# Patient Record
Sex: Female | Born: 1985 | Race: Black or African American | Hispanic: No | Marital: Married | Smoking: Never smoker
Health system: Southern US, Community
[De-identification: ages and names within clinical notes are randomized; demographics above are authoritative.]

## PROBLEM LIST (undated history)

## (undated) DIAGNOSIS — T7840XA Allergy, unspecified, initial encounter: Secondary | ICD-10-CM

## (undated) DIAGNOSIS — D573 Sickle-cell trait: Secondary | ICD-10-CM

## (undated) DIAGNOSIS — I10 Essential (primary) hypertension: Secondary | ICD-10-CM

## (undated) DIAGNOSIS — M1990 Unspecified osteoarthritis, unspecified site: Secondary | ICD-10-CM

## (undated) DIAGNOSIS — J45909 Unspecified asthma, uncomplicated: Secondary | ICD-10-CM

## (undated) DIAGNOSIS — Z5189 Encounter for other specified aftercare: Secondary | ICD-10-CM

## (undated) DIAGNOSIS — M6751 Plica syndrome, right knee: Secondary | ICD-10-CM

## (undated) DIAGNOSIS — R7303 Prediabetes: Secondary | ICD-10-CM

## (undated) DIAGNOSIS — G43909 Migraine, unspecified, not intractable, without status migrainosus: Secondary | ICD-10-CM

## (undated) DIAGNOSIS — N857 Hematometra: Secondary | ICD-10-CM

## (undated) DIAGNOSIS — G4730 Sleep apnea, unspecified: Secondary | ICD-10-CM

## (undated) DIAGNOSIS — M94261 Chondromalacia, right knee: Secondary | ICD-10-CM

## (undated) DIAGNOSIS — D571 Sickle-cell disease without crisis: Secondary | ICD-10-CM

## (undated) DIAGNOSIS — F419 Anxiety disorder, unspecified: Secondary | ICD-10-CM

## (undated) DIAGNOSIS — J302 Other seasonal allergic rhinitis: Secondary | ICD-10-CM

## (undated) DIAGNOSIS — E119 Type 2 diabetes mellitus without complications: Secondary | ICD-10-CM

## (undated) DIAGNOSIS — J069 Acute upper respiratory infection, unspecified: Secondary | ICD-10-CM

## (undated) DIAGNOSIS — D649 Anemia, unspecified: Secondary | ICD-10-CM

## (undated) HISTORY — PX: HERNIA REPAIR: SHX51

## (undated) HISTORY — DX: Allergy, unspecified, initial encounter: T78.40XA

## (undated) HISTORY — DX: Unspecified osteoarthritis, unspecified site: M19.90

## (undated) HISTORY — DX: Migraine, unspecified, not intractable, without status migrainosus: G43.909

## (undated) HISTORY — DX: Sleep apnea, unspecified: G47.30

## (undated) HISTORY — PX: DIAGNOSTIC LAPAROSCOPY: SUR761

## (undated) HISTORY — DX: Sickle-cell trait: D57.3

## (undated) HISTORY — DX: Encounter for other specified aftercare: Z51.89

## (undated) HISTORY — PX: ABDOMINAL HYSTERECTOMY: SHX81

## (undated) HISTORY — DX: Acute upper respiratory infection, unspecified: J06.9

## (undated) HISTORY — DX: Hematometra: N85.7

## (undated) HISTORY — DX: Type 2 diabetes mellitus without complications: E11.9

## (undated) HISTORY — PX: SPINE SURGERY: SHX786

## (undated) HISTORY — DX: Sickle-cell disease without crisis: D57.1

---
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
## 2005-02-25 ENCOUNTER — Encounter: Admission: RE | Admit: 2005-02-25 | Discharge: 2005-02-25 | Payer: Self-pay | Admitting: *Deleted

## 2005-03-06 ENCOUNTER — Inpatient Hospital Stay (HOSPITAL_COMMUNITY): Admission: AD | Admit: 2005-03-06 | Discharge: 2005-03-07 | Payer: Self-pay | Admitting: Obstetrics and Gynecology

## 2005-05-27 DIAGNOSIS — Z5189 Encounter for other specified aftercare: Secondary | ICD-10-CM

## 2005-05-27 DIAGNOSIS — IMO0001 Reserved for inherently not codable concepts without codable children: Secondary | ICD-10-CM

## 2005-05-27 HISTORY — DX: Encounter for other specified aftercare: Z51.89

## 2005-05-27 HISTORY — DX: Reserved for inherently not codable concepts without codable children: IMO0001

## 2005-06-28 IMAGING — US US FETAL BPP W/O NONSTRESS
1 series · 13 of 28 positions shown · non-contrast
Comparison: none

CLINICAL DATA: 28 week 2 day assigned gestational age.  Preterm labor.  Vaginal bleeding.  Evaluate amniotic fluid, cervix and biophysical profile.

[Series 1: us fetal bpp w/o nonstress · 0.33mm/px · 13 of 43 slices shown]
[im 2/43]
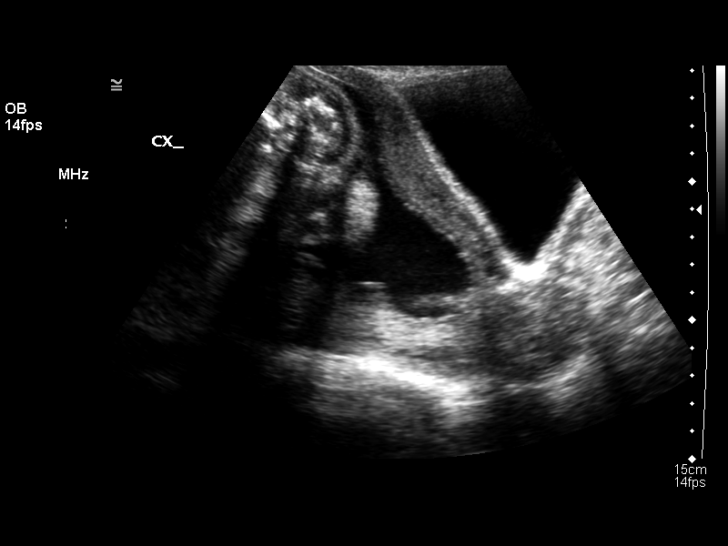
[im 5/43]
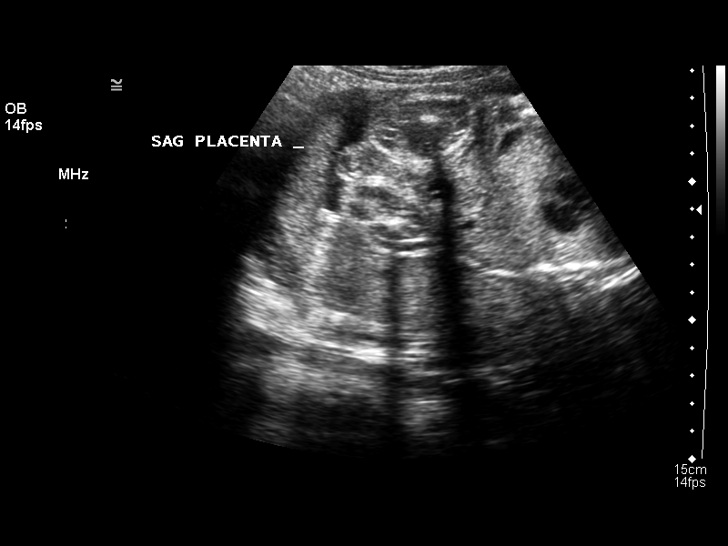
[im 8/43]
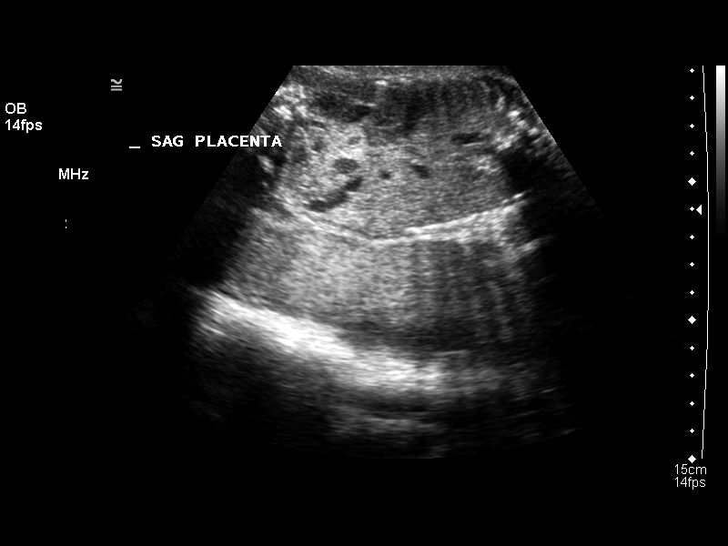
[im 11/43]
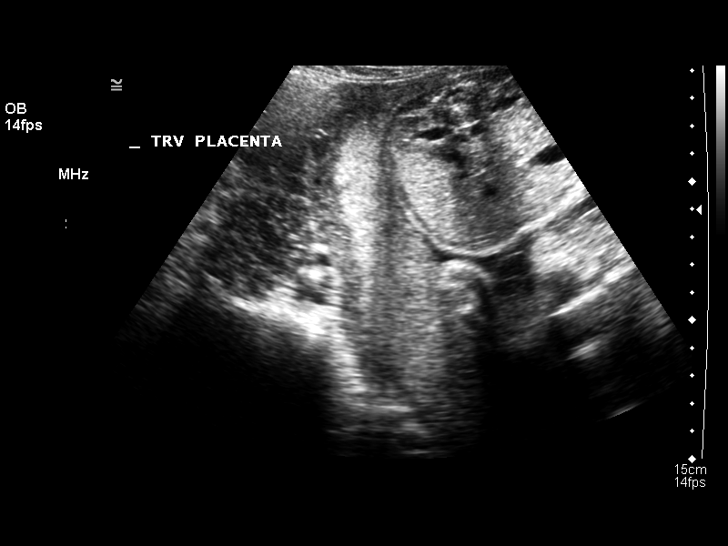
[im 15/43]
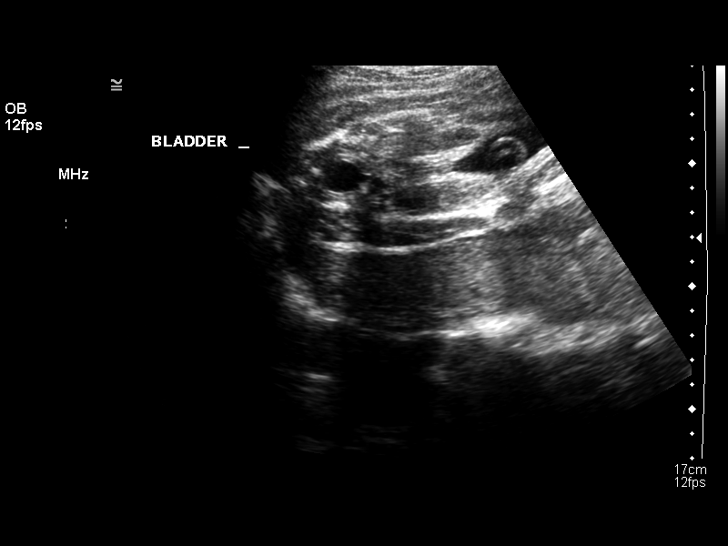
[im 18/43]
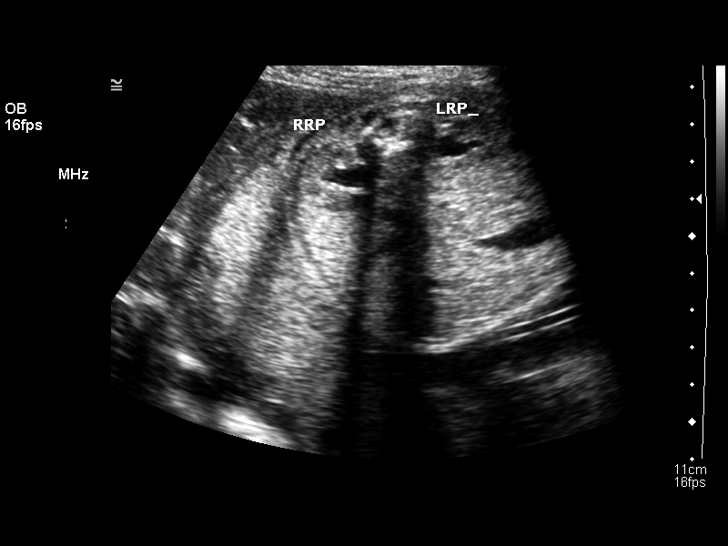
[im 22/43]
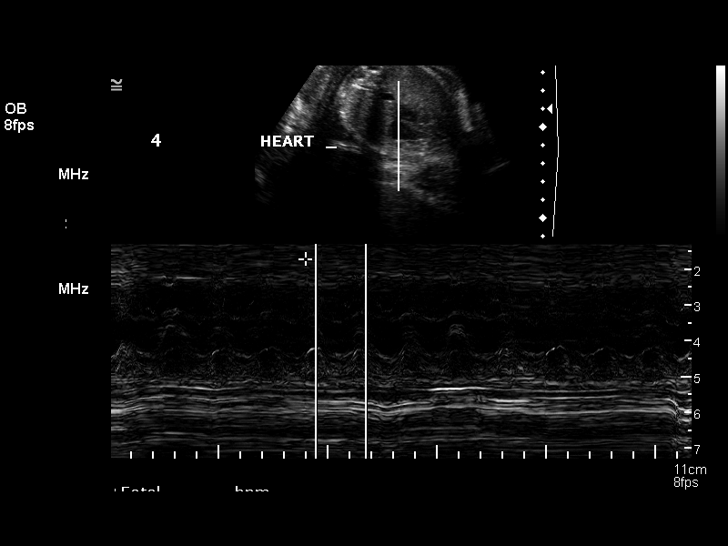
[im 25/43]
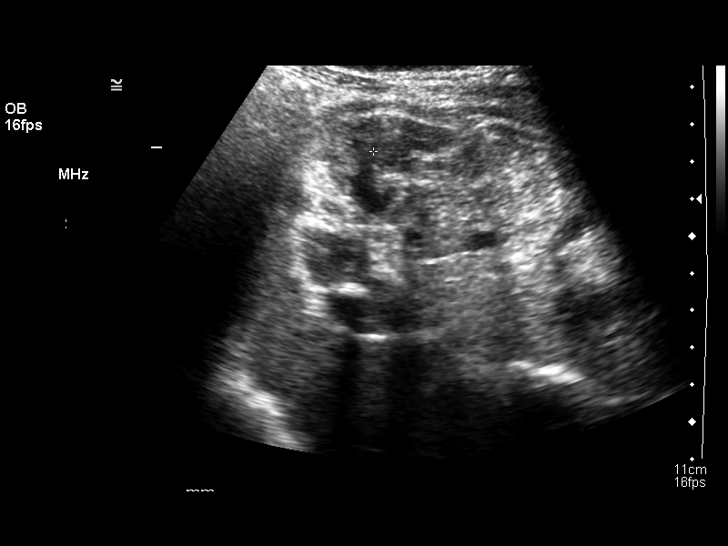
[im 29/43]
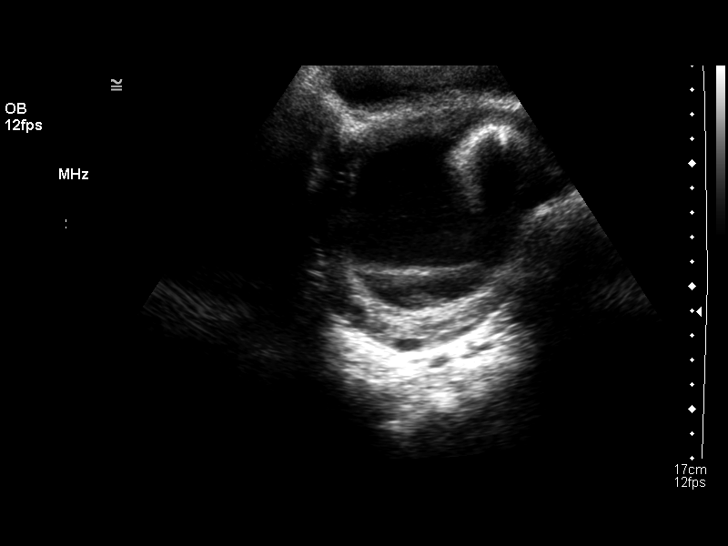
[im 32/43]
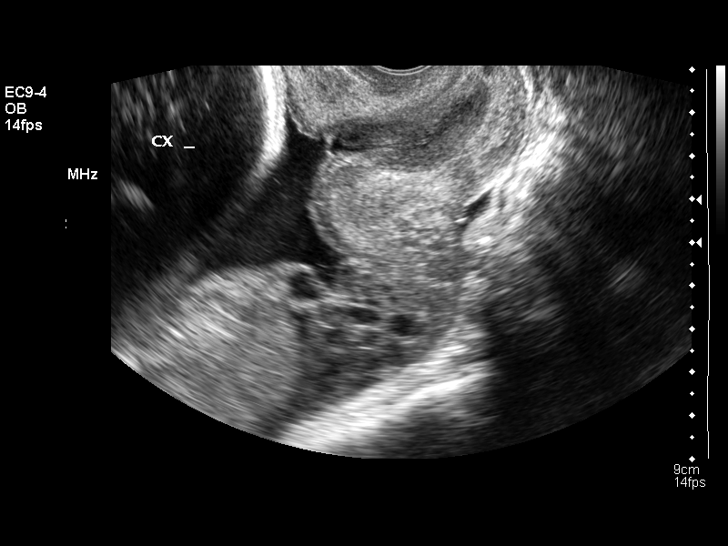
[im 35/43]
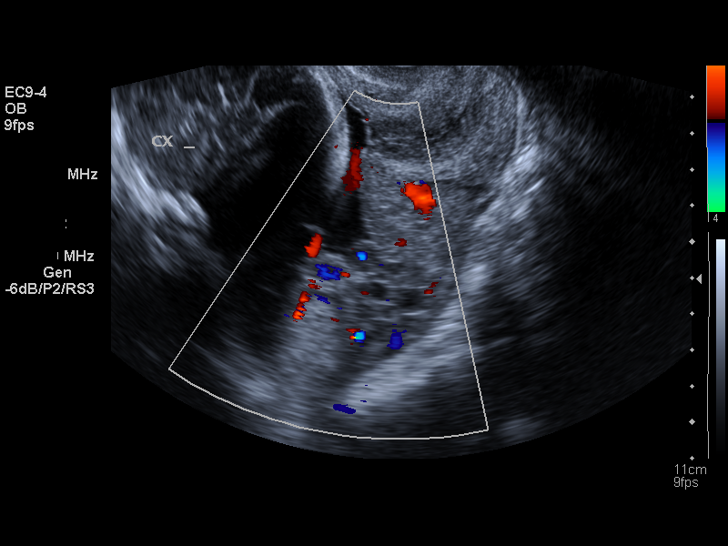
[im 38/43]
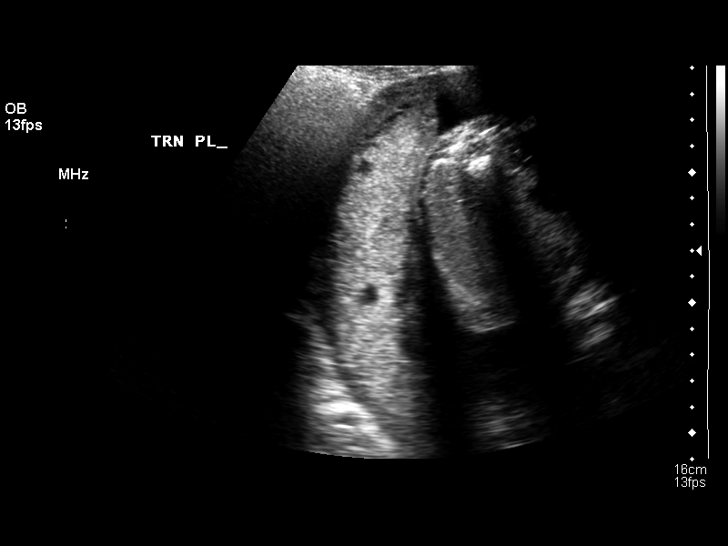
[im 41/43]
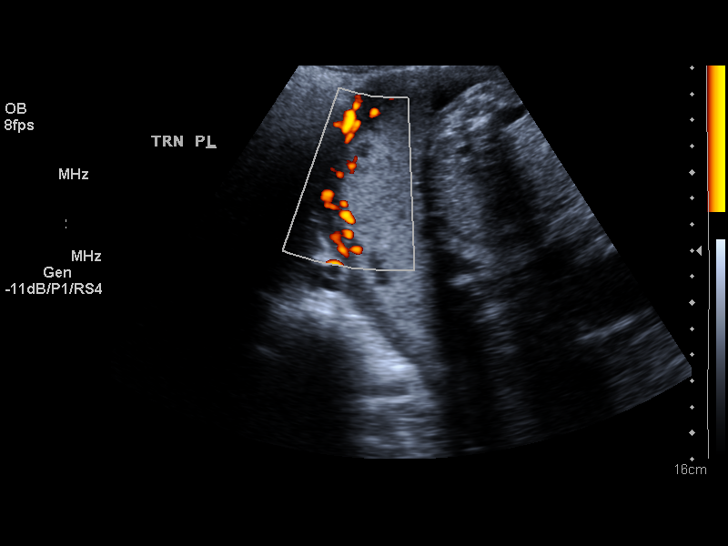

[13 of 28 positions shown; findings below may reference images not displayed]

LIMITED OBSTETRICAL ULTRASOUND WITH TRANSVAGNAL:
 Number of Fetuses:  1
 Heart Rate:  131
 Movement:  Yes
 Breathing:  No
 Presentation:  Oblique
 Placental Location:  Posterior
 Grade:  I
 Previa:  No
 Amniotic Fluid (Subjective):  Normal
 Amniotic Fluid (Objective):  11.9 cm AFI (5th -95th%ile = 9.4 ? 22.8 cm for 28 wks)

 Fetal measurements and complete anatomic evaluation were not requested.  The following fetal anatomy was visualized during this exam:  Lateral ventricles, four chamber heart, stomach, kidneys and bladder.  
 Comment:  Mild bilateral fetal renal pyelectasis is seen with the left renal pelvis measuring 7 mm and the right renal pelvis measuring 6 mm.  

 MATERNAL FINDINGS
 Cervix:  3.8 cm Transvaginally
 There is no evidence of placental abruption.
IMPRESSION: Single living intrauterine fetus.  Normal amniotic fluid volume.
 Normal cervical length measuring 3.8 cm.  No evidence of placental abruption or previa.
 Mild bilateral fetal renal pyelectasis.  Ultrasound follow-up is recommended after 33 weeks gestational age.  

 BIOPHYSICAL PROFILE

 Movement:  2    Time:  30 minutes
 Breathing:  0
 Tone:  2
 Amniotic Fluid:  2

 Total Score:  6
IMPRESSION: Biophysical profile score [DATE].

## 2005-07-11 ENCOUNTER — Inpatient Hospital Stay (HOSPITAL_COMMUNITY): Admission: AD | Admit: 2005-07-11 | Discharge: 2005-07-11 | Payer: Self-pay | Admitting: Obstetrics and Gynecology

## 2005-07-16 ENCOUNTER — Inpatient Hospital Stay (HOSPITAL_COMMUNITY): Admission: AD | Admit: 2005-07-16 | Discharge: 2005-07-16 | Payer: Self-pay | Admitting: Obstetrics and Gynecology

## 2005-07-28 ENCOUNTER — Inpatient Hospital Stay (HOSPITAL_COMMUNITY): Admission: AD | Admit: 2005-07-28 | Discharge: 2005-07-29 | Payer: Self-pay | Admitting: Obstetrics and Gynecology

## 2005-09-04 ENCOUNTER — Inpatient Hospital Stay (HOSPITAL_COMMUNITY): Admission: AD | Admit: 2005-09-04 | Discharge: 2005-09-05 | Payer: Self-pay | Admitting: Obstetrics and Gynecology

## 2005-09-05 ENCOUNTER — Inpatient Hospital Stay (HOSPITAL_COMMUNITY): Admission: AD | Admit: 2005-09-05 | Discharge: 2005-09-05 | Payer: Self-pay | Admitting: Obstetrics and Gynecology

## 2005-09-08 ENCOUNTER — Inpatient Hospital Stay (HOSPITAL_COMMUNITY): Admission: AD | Admit: 2005-09-08 | Discharge: 2005-09-16 | Payer: Self-pay | Admitting: Obstetrics and Gynecology

## 2005-09-12 ENCOUNTER — Ambulatory Visit: Payer: Self-pay | Admitting: Neonatology

## 2005-09-20 ENCOUNTER — Observation Stay (HOSPITAL_COMMUNITY): Admission: AD | Admit: 2005-09-20 | Discharge: 2005-09-20 | Payer: Self-pay | Admitting: Obstetrics and Gynecology

## 2005-09-26 ENCOUNTER — Ambulatory Visit: Payer: Self-pay | Admitting: Family Medicine

## 2005-09-26 ENCOUNTER — Inpatient Hospital Stay (HOSPITAL_COMMUNITY): Admission: AD | Admit: 2005-09-26 | Discharge: 2005-09-26 | Payer: Self-pay | Admitting: Obstetrics and Gynecology

## 2005-09-27 ENCOUNTER — Inpatient Hospital Stay (HOSPITAL_COMMUNITY): Admission: AD | Admit: 2005-09-27 | Discharge: 2005-10-01 | Payer: Self-pay | Admitting: Obstetrics and Gynecology

## 2005-09-28 ENCOUNTER — Encounter (INDEPENDENT_AMBULATORY_CARE_PROVIDER_SITE_OTHER): Payer: Self-pay | Admitting: Specialist

## 2005-10-03 ENCOUNTER — Inpatient Hospital Stay (HOSPITAL_COMMUNITY): Admission: AD | Admit: 2005-10-03 | Discharge: 2005-10-03 | Payer: Self-pay | Admitting: Obstetrics and Gynecology

## 2005-11-08 ENCOUNTER — Other Ambulatory Visit: Admission: RE | Admit: 2005-11-08 | Discharge: 2005-11-08 | Payer: Self-pay | Admitting: Obstetrics and Gynecology

## 2006-03-04 ENCOUNTER — Emergency Department (HOSPITAL_COMMUNITY): Admission: EM | Admit: 2006-03-04 | Discharge: 2006-03-04 | Payer: Self-pay | Admitting: Family Medicine

## 2006-07-03 ENCOUNTER — Emergency Department (HOSPITAL_COMMUNITY): Admission: EM | Admit: 2006-07-03 | Discharge: 2006-07-03 | Payer: Self-pay | Admitting: Family Medicine

## 2006-09-25 HISTORY — PX: OTHER SURGICAL HISTORY: SHX169

## 2006-11-01 ENCOUNTER — Emergency Department (HOSPITAL_COMMUNITY): Admission: EM | Admit: 2006-11-01 | Discharge: 2006-11-01 | Payer: Self-pay | Admitting: Family Medicine

## 2006-11-17 ENCOUNTER — Emergency Department (HOSPITAL_COMMUNITY): Admission: EM | Admit: 2006-11-17 | Discharge: 2006-11-17 | Payer: Self-pay | Admitting: Emergency Medicine

## 2007-01-22 ENCOUNTER — Emergency Department (HOSPITAL_COMMUNITY): Admission: EM | Admit: 2007-01-22 | Discharge: 2007-01-22 | Payer: Self-pay | Admitting: Family Medicine

## 2007-02-09 ENCOUNTER — Emergency Department (HOSPITAL_COMMUNITY): Admission: EM | Admit: 2007-02-09 | Discharge: 2007-02-09 | Payer: Self-pay | Admitting: Emergency Medicine

## 2007-06-02 ENCOUNTER — Ambulatory Visit (HOSPITAL_COMMUNITY): Admission: RE | Admit: 2007-06-02 | Discharge: 2007-06-02 | Payer: Self-pay | Admitting: Obstetrics and Gynecology

## 2007-06-02 ENCOUNTER — Encounter (INDEPENDENT_AMBULATORY_CARE_PROVIDER_SITE_OTHER): Payer: Self-pay | Admitting: Obstetrics and Gynecology

## 2007-06-21 ENCOUNTER — Emergency Department (HOSPITAL_COMMUNITY): Admission: EM | Admit: 2007-06-21 | Discharge: 2007-06-21 | Payer: Self-pay | Admitting: Family Medicine

## 2007-08-13 ENCOUNTER — Emergency Department (HOSPITAL_COMMUNITY): Admission: EM | Admit: 2007-08-13 | Discharge: 2007-08-13 | Payer: Self-pay | Admitting: Emergency Medicine

## 2008-04-04 ENCOUNTER — Emergency Department (HOSPITAL_COMMUNITY): Admission: EM | Admit: 2008-04-04 | Discharge: 2008-04-04 | Payer: Self-pay | Admitting: Family Medicine

## 2008-06-08 ENCOUNTER — Inpatient Hospital Stay (HOSPITAL_COMMUNITY): Admission: AD | Admit: 2008-06-08 | Discharge: 2008-06-08 | Payer: Self-pay | Admitting: Obstetrics and Gynecology

## 2008-10-26 ENCOUNTER — Inpatient Hospital Stay (HOSPITAL_COMMUNITY): Admission: AD | Admit: 2008-10-26 | Discharge: 2008-10-26 | Payer: Self-pay | Admitting: Obstetrics and Gynecology

## 2008-11-16 ENCOUNTER — Encounter: Admission: RE | Admit: 2008-11-16 | Discharge: 2008-11-16 | Payer: Self-pay | Admitting: Obstetrics and Gynecology

## 2009-01-02 ENCOUNTER — Inpatient Hospital Stay (HOSPITAL_COMMUNITY): Admission: AD | Admit: 2009-01-02 | Discharge: 2009-01-02 | Payer: Self-pay | Admitting: Obstetrics and Gynecology

## 2009-01-20 ENCOUNTER — Encounter (INDEPENDENT_AMBULATORY_CARE_PROVIDER_SITE_OTHER): Payer: Self-pay | Admitting: Obstetrics and Gynecology

## 2009-01-20 ENCOUNTER — Inpatient Hospital Stay (HOSPITAL_COMMUNITY): Admission: RE | Admit: 2009-01-20 | Discharge: 2009-01-23 | Payer: Self-pay | Admitting: Obstetrics and Gynecology

## 2009-01-20 DIAGNOSIS — N939 Abnormal uterine and vaginal bleeding, unspecified: Secondary | ICD-10-CM

## 2009-01-20 DIAGNOSIS — N857 Hematometra: Secondary | ICD-10-CM

## 2009-01-20 HISTORY — PX: TUBAL LIGATION: SHX77

## 2009-01-20 HISTORY — DX: Abnormal uterine and vaginal bleeding, unspecified: N93.9

## 2009-01-20 HISTORY — DX: Hematometra: N85.7

## 2009-05-17 ENCOUNTER — Emergency Department (HOSPITAL_COMMUNITY): Admission: EM | Admit: 2009-05-17 | Discharge: 2009-05-17 | Payer: Self-pay | Admitting: Family Medicine

## 2009-07-15 ENCOUNTER — Emergency Department (HOSPITAL_COMMUNITY): Admission: EM | Admit: 2009-07-15 | Discharge: 2009-07-15 | Payer: Self-pay | Admitting: Family Medicine

## 2010-01-05 ENCOUNTER — Inpatient Hospital Stay (HOSPITAL_COMMUNITY): Admission: AD | Admit: 2010-01-05 | Discharge: 2010-01-05 | Payer: Self-pay | Admitting: Obstetrics and Gynecology

## 2010-05-29 ENCOUNTER — Emergency Department (HOSPITAL_COMMUNITY)
Admission: EM | Admit: 2010-05-29 | Discharge: 2010-05-29 | Payer: Self-pay | Source: Home / Self Care | Admitting: Emergency Medicine

## 2010-07-11 ENCOUNTER — Emergency Department (HOSPITAL_COMMUNITY)
Admission: EM | Admit: 2010-07-11 | Discharge: 2010-07-12 | Disposition: A | Discharge status: Home / Self Care | Payer: Self-pay | Attending: Emergency Medicine | Admitting: Emergency Medicine

## 2010-07-11 DIAGNOSIS — J45909 Unspecified asthma, uncomplicated: Secondary | ICD-10-CM | POA: Insufficient documentation

## 2010-07-11 DIAGNOSIS — R059 Cough, unspecified: Secondary | ICD-10-CM | POA: Insufficient documentation

## 2010-07-11 DIAGNOSIS — R05 Cough: Secondary | ICD-10-CM | POA: Insufficient documentation

## 2010-07-11 DIAGNOSIS — J3489 Other specified disorders of nose and nasal sinuses: Secondary | ICD-10-CM | POA: Insufficient documentation

## 2010-07-11 DIAGNOSIS — R070 Pain in throat: Secondary | ICD-10-CM | POA: Insufficient documentation

## 2010-08-06 LAB — DIFFERENTIAL
Band Neutrophils: 0 % (ref 0–10)
Basophils Absolute: 0.1 10*3/uL (ref 0.0–0.1)
Basophils Relative: 1 % (ref 0–1)
Blasts: 0 %
Eosinophils Absolute: 0 10*3/uL (ref 0.0–0.7)
Eosinophils Relative: 0 % (ref 0–5)
Lymphocytes Relative: 28 % (ref 12–46)
Lymphs Abs: 2.4 10*3/uL (ref 0.7–4.0)
Metamyelocytes Relative: 0 %
Monocytes Absolute: 0.6 10*3/uL (ref 0.1–1.0)
Monocytes Relative: 7 % (ref 3–12)
Myelocytes: 0 %
Neutro Abs: 5.5 10*3/uL (ref 1.7–7.7)
Neutrophils Relative %: 64 % (ref 43–77)
Promyelocytes Absolute: 0 %
nRBC: 0 /100 WBC

## 2010-08-06 LAB — CBC
HCT: 38.9 % (ref 36.0–46.0)
Hemoglobin: 13.2 g/dL (ref 12.0–15.0)
MCH: 27.1 pg (ref 26.0–34.0)
MCHC: 33.9 g/dL (ref 30.0–36.0)
MCV: 79.9 fL (ref 78.0–100.0)
Platelets: 212 10*3/uL (ref 150–400)
RBC: 4.87 MIL/uL (ref 3.87–5.11)
RDW: 15.1 % (ref 11.5–15.5)
WBC: 8.6 10*3/uL (ref 4.0–10.5)

## 2010-08-06 LAB — POCT URINALYSIS DIPSTICK
Bilirubin Urine: NEGATIVE
Glucose, UA: NEGATIVE mg/dL
Hgb urine dipstick: NEGATIVE
Ketones, ur: NEGATIVE mg/dL
Nitrite: NEGATIVE
Protein, ur: NEGATIVE mg/dL
Specific Gravity, Urine: 1.02 (ref 1.005–1.030)
Urobilinogen, UA: 0.2 mg/dL (ref 0.0–1.0)
pH: 5.5 (ref 5.0–8.0)

## 2010-08-06 LAB — POCT PREGNANCY, URINE: Preg Test, Ur: NEGATIVE

## 2010-08-10 LAB — CBC
HCT: 37.7 % (ref 36.0–46.0)
Hemoglobin: 12.7 g/dL (ref 12.0–15.0)
MCV: 85.3 fL (ref 78.0–100.0)
RDW: 16 % — ABNORMAL HIGH (ref 11.5–15.5)
WBC: 5.8 10*3/uL (ref 4.0–10.5)

## 2010-08-10 LAB — URINALYSIS, ROUTINE W REFLEX MICROSCOPIC
Ketones, ur: NEGATIVE mg/dL
Protein, ur: NEGATIVE mg/dL
Urobilinogen, UA: 0.2 mg/dL (ref 0.0–1.0)

## 2010-08-10 LAB — URINE MICROSCOPIC-ADD ON

## 2010-08-27 ENCOUNTER — Inpatient Hospital Stay (HOSPITAL_COMMUNITY): Payer: Self-pay

## 2010-08-27 ENCOUNTER — Inpatient Hospital Stay (HOSPITAL_COMMUNITY)
Admission: AD | Admit: 2010-08-27 | Discharge: 2010-08-27 | Disposition: A | Discharge status: Home / Self Care | Payer: Self-pay | Source: Ambulatory Visit | Attending: Obstetrics & Gynecology | Admitting: Obstetrics & Gynecology

## 2010-08-27 DIAGNOSIS — N949 Unspecified condition associated with female genital organs and menstrual cycle: Secondary | ICD-10-CM | POA: Insufficient documentation

## 2010-08-27 DIAGNOSIS — R109 Unspecified abdominal pain: Secondary | ICD-10-CM | POA: Insufficient documentation

## 2010-08-27 LAB — URINALYSIS, ROUTINE W REFLEX MICROSCOPIC
Glucose, UA: NEGATIVE mg/dL
Hgb urine dipstick: NEGATIVE
Protein, ur: NEGATIVE mg/dL
Specific Gravity, Urine: 1.02 (ref 1.005–1.030)
pH: 5.5 (ref 5.0–8.0)

## 2010-08-27 LAB — CBC
HCT: 35.1 % — ABNORMAL LOW (ref 36.0–46.0)
Hemoglobin: 11.8 g/dL — ABNORMAL LOW (ref 12.0–15.0)
MCV: 78.9 fL (ref 78.0–100.0)
RBC: 4.45 MIL/uL (ref 3.87–5.11)
WBC: 5.6 10*3/uL (ref 4.0–10.5)

## 2010-08-27 LAB — WET PREP, GENITAL: Trich, Wet Prep: NONE SEEN

## 2010-08-28 LAB — GC/CHLAMYDIA PROBE AMP, GENITAL
Chlamydia, DNA Probe: NEGATIVE
GC Probe Amp, Genital: NEGATIVE

## 2010-09-01 LAB — BASIC METABOLIC PANEL
BUN: 3 mg/dL — ABNORMAL LOW (ref 6–23)
BUN: 3 mg/dL — ABNORMAL LOW (ref 6–23)
CO2: 22 mEq/L (ref 19–32)
Chloride: 113 mEq/L — ABNORMAL HIGH (ref 96–112)
Creatinine, Ser: 0.6 mg/dL (ref 0.4–1.2)
Creatinine, Ser: 0.65 mg/dL (ref 0.4–1.2)
GFR calc non Af Amer: >60 mL/min (ref >60)
Glucose, Bld: 59 mg/dL — ABNORMAL LOW (ref 70–99)
Glucose, Bld: 91 mg/dL (ref 70–99)
Potassium: 3.7 mEq/L (ref 3.5–5.1)

## 2010-09-01 LAB — DIFFERENTIAL
Basophils Absolute: 0 10*3/uL (ref 0.0–0.1)
Basophils Relative: 0 % (ref 0–1)
Basophils Relative: 1 % (ref 0–1)
Eosinophils Absolute: 0 10*3/uL (ref 0.0–0.7)
Eosinophils Relative: 1 % (ref 0–5)
Lymphocytes Relative: 13 % (ref 12–46)
Lymphs Abs: 1 10*3/uL (ref 0.7–4.0)
Monocytes Absolute: 0.6 10*3/uL (ref 0.1–1.0)
Monocytes Absolute: 0.8 10*3/uL (ref 0.1–1.0)
Monocytes Relative: 8 % (ref 3–12)
Neutro Abs: 5.6 10*3/uL (ref 1.7–7.7)

## 2010-09-01 LAB — CBC
HCT: 22.4 % — ABNORMAL LOW (ref 36.0–46.0)
HCT: 23.1 % — ABNORMAL LOW (ref 36.0–46.0)
HCT: 24.9 % — ABNORMAL LOW (ref 36.0–46.0)
HCT: 26.4 % — ABNORMAL LOW (ref 36.0–46.0)
HCT: 28.4 % — ABNORMAL LOW (ref 36.0–46.0)
HCT: 30.2 % — ABNORMAL LOW (ref 36.0–46.0)
HCT: 37.8 % (ref 36.0–46.0)
Hemoglobin: 10.3 g/dL — ABNORMAL LOW (ref 12.0–15.0)
Hemoglobin: 7.9 g/dL — CL (ref 12.0–15.0)
Hemoglobin: 8.4 g/dL — ABNORMAL LOW (ref 12.0–15.0)
Hemoglobin: 9.2 g/dL — ABNORMAL LOW (ref 12.0–15.0)
Hemoglobin: 9.6 g/dL — ABNORMAL LOW (ref 12.0–15.0)
MCHC: 32.8 g/dL (ref 30.0–36.0)
MCHC: 33.8 g/dL (ref 30.0–36.0)
MCHC: 33.9 g/dL (ref 30.0–36.0)
MCHC: 34.1 g/dL (ref 30.0–36.0)
MCHC: 34.8 g/dL (ref 30.0–36.0)
MCV: 83.4 fL (ref 78.0–100.0)
MCV: 85.9 fL (ref 78.0–100.0)
MCV: 86.2 fL (ref 78.0–100.0)
MCV: 86.7 fL (ref 78.0–100.0)
MCV: 87.6 fL (ref 78.0–100.0)
Platelets: 102 10*3/uL — ABNORMAL LOW (ref 150–400)
Platelets: 110 10*3/uL — ABNORMAL LOW (ref 150–400)
Platelets: 156 10*3/uL (ref 150–400)
Platelets: 189 10*3/uL (ref 150–400)
Platelets: 91 10*3/uL — ABNORMAL LOW (ref 150–400)
Platelets: 98 10*3/uL — ABNORMAL LOW (ref 150–400)
RBC: 2.64 MIL/uL — ABNORMAL LOW (ref 3.87–5.11)
RBC: 3.3 MIL/uL — ABNORMAL LOW (ref 3.87–5.11)
RDW: 17.4 % — ABNORMAL HIGH (ref 11.5–15.5)
RDW: 18.6 % — ABNORMAL HIGH (ref 11.5–15.5)
RDW: 21.3 % — ABNORMAL HIGH (ref 11.5–15.5)
RDW: 21.5 % — ABNORMAL HIGH (ref 11.5–15.5)
RDW: 21.9 % — ABNORMAL HIGH (ref 11.5–15.5)
WBC: 10.5 10*3/uL (ref 4.0–10.5)
WBC: 11.4 10*3/uL — ABNORMAL HIGH (ref 4.0–10.5)
WBC: 7.6 10*3/uL (ref 4.0–10.5)
WBC: 9.8 10*3/uL (ref 4.0–10.5)

## 2010-09-01 LAB — CROSSMATCH
ABO/RH(D): O POS
Antibody Screen: NEGATIVE

## 2010-09-01 LAB — PREPARE FRESH FROZEN PLASMA

## 2010-09-01 LAB — CARBOXYHEMOGLOBIN: Total hemoglobin: 7.8 g/dL — CL (ref 12.5–16.0)

## 2010-09-01 LAB — RPR: RPR Ser Ql: NONREACTIVE

## 2010-09-01 LAB — GLUCOSE, CAPILLARY: Glucose-Capillary: 70 mg/dL (ref 70–99)

## 2010-09-03 LAB — URINALYSIS, ROUTINE W REFLEX MICROSCOPIC
Ketones, ur: NEGATIVE mg/dL
Nitrite: NEGATIVE
Protein, ur: NEGATIVE mg/dL
Urobilinogen, UA: 0.2 mg/dL (ref 0.0–1.0)

## 2010-09-03 LAB — STREP B DNA PROBE

## 2010-09-03 LAB — GC/CHLAMYDIA PROBE AMP, GENITAL: GC Probe Amp, Genital: NEGATIVE

## 2010-09-03 LAB — WET PREP, GENITAL: Trich, Wet Prep: NONE SEEN

## 2010-09-10 LAB — WET PREP, GENITAL
Clue Cells Wet Prep HPF POC: NONE SEEN
Trich, Wet Prep: NONE SEEN
Yeast Wet Prep HPF POC: NONE SEEN

## 2010-09-10 LAB — URINALYSIS, ROUTINE W REFLEX MICROSCOPIC
Bilirubin Urine: NEGATIVE
Glucose, UA: NEGATIVE mg/dL
Hgb urine dipstick: NEGATIVE
Ketones, ur: NEGATIVE mg/dL
Nitrite: NEGATIVE
Protein, ur: NEGATIVE mg/dL
Specific Gravity, Urine: 1.01 (ref 1.005–1.030)
Urobilinogen, UA: 0.2 mg/dL (ref 0.0–1.0)
pH: 7 (ref 5.0–8.0)

## 2010-09-10 LAB — GC/CHLAMYDIA PROBE AMP, GENITAL
Chlamydia, DNA Probe: NEGATIVE
GC Probe Amp, Genital: NEGATIVE

## 2010-09-10 LAB — POCT PREGNANCY, URINE: Preg Test, Ur: POSITIVE

## 2010-10-09 NOTE — H&P (Signed)
Penny Thomas, Penny Thomas             ACCOUNT NO.:  [id number]   MEDICAL RECORD NO.:  [id number]          PATIENT TYPE:  INP   LOCATION:  9372                          FACILITY:  [facility]   PHYSICIAN:  Osborn Coho, M.D.   DATE OF BIRTH:  [birth year]   DATE OF ADMISSION:  01/20/2009  DATE OF DISCHARGE:                              HISTORY & PHYSICAL   A 25 year old gravida 3, para 2, AB 0 with living children 3 (twins  presents to outpatient surgical department for scheduled repeat C-  section).  Prenatal care at [facility], beginning July 04, 2008.   ADMISSION DIAGNOSES:  1. Repeat C-section and tubal ligation.  2. History of twins, one C-section, one vaginally.  3. Asthma.  4. Previous history of preterm delivery at 33 weeks and preterm birth.  5. Sickle cell trait, both father and the patient has sickle cell      trait.  6. Gestational diabetes, on insulin.   HISTORY OF PRESENT ILLNESS:  Penny Thomas presented for care, July 14, 2008, wants to try the vaginal birth after C-section.  She had 1 C-  section with twins.  She had an ultrasound recently and elected that she  would have a repeat C-section due to the size of the infant on  ultrasound.  Due to sickle cell trait, monthly urines were obtained and  were negative.  The father of the baby has sickle cell trait also, was  treated for preterm labor with Procardia.  Ultrasounds stated that the  cervix was 4.1.  AFI was normal.  She is on NPH 10 in the morning and 8  in the p.m.  She had been sent to the diabetic counseling/teaching and  was following her diet.   LABORATORY DATA:  She is O+, antibody screen negative, VDRL negative,  rubella immune, HBSAg negative, HIV nonreactive.  Quad screen negative.  Pap GC CT negative.  FFA negative at 28 weeks.  Her hemoglobin was 10.5.  RPR was negative.  One-hour test was 169.  She proceeded to have a 3-  hour GTT that was 82-105, 178, 153.  Dr. Normand Sloop sent her for  diabetic  teaching.  Her fasting blood sugars had been 80-108.   PAST MEDICAL HISTORY:  Denies drug allergies.  She does have asthma,  sickle cell trait, both her and the father of the baby.   PAST SURGICAL HISTORY:  She had a primary C-section for one of the twins  with hernia repair and D and C in 2009.   PAST OBSTETRIC HISTORY:  In 2006, she has a spontaneous vaginal delivery  of a viable female infant weighing 6 pounds in 2007, twins at 60 weeks,  female 4 pounds each.   PAST FAMILY HISTORY:  Paternal grandfather has heart disease.  Her  maternal grandmother has chronic hypertension.  Her maternal grandmother  has thyroid problems.   SOCIAL HISTORY:  She is married to Alphonzo Severance who completed the tenth  grade.  No percussion was noted on the chart.  The patient is a full-  time student and denies smoking, drugs, or alcohol.  REVIEW OF SYSTEMS:  Denies headaches, dizziness, urinary tract  infections, or preterm labor symptoms.  HEART:  Regular rate without  murmur.  LUNGS: Clear bilaterally.  ABDOMEN:  Liver, kidney, spleen,  gravid.  Baby is active.  Fetal heart was 146.   ASSESSMENT:  Intrauterine pregnancy at 39 weeks, gestational diabetic,  on insulin.   PLAN:  1. Repeat C-section and BTL Scheduled...VBAC declined.  2. Desires Permanent Sterilization.  She is prepped and ready for her      C-section with Dr. Su Hilt and we will continue her postop care      after the birth of her baby.      Jasmine Awe, CNM      Osborn Coho, M.D.  Electronically Signed    JM/MEDQ  D:  01/20/2009  T:  01/21/2009  Job:  [id number]

## 2010-10-09 NOTE — Discharge Summary (Signed)
Penny Thomas, Penny Thomas             ACCOUNT NO.:  [id number]   MEDICAL RECORD NO.:  [id number]          PATIENT TYPE:  INP   LOCATION:  9116                          [facility]ACILITY:  [facility]   PHYSICIAN:  Crist Fat. Rivard, M.D. DATE OF BIRTH:  November 08, 1985   DATE OF ADMISSION:  01/20/2009  DATE OF DISCHARGE:  01/23/2009                               DISCHARGE SUMMARY   She is being discharged status post C-section and bilateral tubal  ligation.   ADMITTING DIAGNOSES:  Repeat cesarean section, bilateral tubal ligation,  history of twins one born by cesarean section and one born vaginally,  asthma, history of preterm delivery at 53 weeks and preterm birth,  sickle cell trait, and gestational diabetes on insulin.   DISCHARGE DIAGNOSES:  Repeat cesarean section, bilateral tubal ligation,  history of twins one born by cesarean section and one by vaginally,  asthma, history of preterm delivery at 33 weeks and preterm birth,  sickle cell trait, gestational diabetes on insulin, anemia, and  intraabdominal bleeding.   HOSPITAL PROCEDURES:  Low transverse cesarean section, bilateral tubal  ligation, laparotomy for intraabdominal bleeding, and transfusion of 5  units of packed red blood cells.   HOSPITAL COURSE:  The patient arrived to the hospital on January 20, 2009, for a scheduled repeat low transverse cesarean section and tubal  ligation at 28 and 6/7th week gestation, and she received spinal  anesthesia.  Attending was Osborn Coho, MD, and first assistant was  Jasmine Awe, CNM.  She gave birth to a live born female infant with  Apgars of 9 and 9, weighing 8 pounds 4 ounces.  Placenta was sent to  Pathology along with bilateral portions of the fallopian tubes.  Amniotic fluid was clear.  Infant was delivered in a vertex position,  when the patient was taken back to the floor following her C-section,  she became hypotensive and unstable.  She was taken back to the  operating room and Dr.  Osborn Coho performed laparotomy, where it was  noted that the suture had become dislodged from the right fallopian  tube, once repaired the bleeding stopped.  The patient received a total  of 5 units of packed red blood cells and has remained hemodynamically  stable since the transfusion.   Admission labs, platelets of 189.  Her lab work during her stay has been  as follows.  Her preop hemoglobin was 12.6.  Her post C-section labs  were hemoglobin 7.9, white blood cells 7.2, platelets 91.  Her CBC  following the laparotomy and transfusion was hemoglobin of 9.2, white  blood cells 7.2, platelets of 98.  Her vital signs were except for  hypotension following the C-section in the 60/40s range and an isolated  temperature of 100.8 following received packed red blood cells.   CONDITION AT DISCHARGE:  Stable.   DISCHARGE INSTRUCTIONS:  Per [facility] handout for postpartum C-section.   DISCHARGE MEDICATIONS:  Ibuprofen 600 mg p.o. q.6 h. P.r.n. dispensed  #36 with 2 refills and Percocet 5/325 1-2 p.o. q.3-4 h. #36, no refills.   DISCHARGE FOLLOWUP:  In 6 weeks at [facility] or as needed.      Dorathy Kinsman, CNM      Dois Davenport A. Rivard, M.D.  Electronically Signed    VS/MEDQ  D:  01/23/2009  T:  01/23/2009  Job:  [id number]

## 2010-10-09 NOTE — Op Note (Signed)
Penny Thomas, Penny Thomas             ACCOUNT NO.:  [id number]   MEDICAL RECORD NO.:  [id number]          PATIENT TYPE:  AMB   LOCATION:  SDC                           [facility]   PHYSICIAN:  Osborn Coho, M.D.   DATE OF BIRTH:  1985/06/26   DATE OF PROCEDURE:  06/02/2007  DATE OF DISCHARGE:                               OPERATIVE REPORT   PREOPERATIVE DIAGNOSES:  1. Postcoital bleeding.  2. Endometrial mass.   POSTOPERATIVE DIAGNOSIS:  Post coital bleeding.   PROCEDURES:  1. Hysteroscopy.  2. D&C.   ATTENDING:  Dr. Osborn Coho.   ANESTHESIA:  MAC.   FINDINGS:  No obvious intracavitary lesions.   SPECIMENS TO PATHOLOGY:  Endometrial curettings.   FLUIDS:  500 mL.   URINE OUTPUT:  Approximately 500 mL via straight catheterization prior  to procedure.   HYSTEROSCOPIC FLUID DEFICIT:  Sorbitol 35 mL.   ESTIMATED BLOOD LOSS:  Minimal.   COMPLICATIONS:  None.   DESCRIPTION OF PROCEDURE:  The patient was taken to the operating room  after the risks, benefit, alternatives were  reviewed with the patient.  The patient verbalized understanding, and consent signed and witnessed.  The patient was given a MAC per anesthesia, and prepped and draped in a  normal sterile fashion in the dorsal lithotomy position.  A bivalve  speculum placed in the patient's vagina and the anterior lip of the  cervix grasped with single-tooth tenaculum.  A paracervical block was  administered using a total of 10 mL of 1% lidocaine.  The uterus was  sounded to 8.5 cm.  The cervix was dilated for passage of the  hysteroscope.  Hysteroscope introduced and no obvious intracavitary  lesions noted.  Curettage was performed and curettings sent to  pathology.  The scope  introduced once again and still no intracavitary lesion was noted.  A  gentle curettage was performed one last time.  All instruments were  removed.  There was good hemostasis at tenaculum site.  Sponge, lap and  needle counts were  correct.  The patient tolerated the procedure well  and was awaiting transfer to the recovery room in good condition.      Osborn Coho, M.D.  Electronically Signed     AR/MEDQ  D:  06/02/2007  T:  06/02/2007  Job:  [id number]

## 2010-10-09 NOTE — Op Note (Signed)
Penny Thomas, Penny Thomas             ACCOUNT NO.:  [id number]   MEDICAL RECORD NO.:  [id number]          PATIENT TYPE:  INP   LOCATION:  9372                          [facility]   PHYSICIAN:  Osborn Coho, M.D.   DATE OF BIRTH:  1985-08-24   DATE OF PROCEDURE:  01/20/2009  DATE OF DISCHARGE:                               OPERATIVE REPORT   PREOPERATIVE DIAGNOSIS:  Acute abdomen, status post repeat C-section and  bilateral tubal ligation.   POSTOPERATIVE DIAGNOSIS:  Intraabdominal bleeding.   PROCEDURES:  1. Exploratory laparotomy.  2. Partial left salpingectomy.  3. Evacuation of clot.   ATTENDING PHYSICIAN:  Osborn Coho, MD   ASSISTANT:  Naima A. Dillard, MD   ANESTHESIA:  General.   SPECIMENS TO PATHOLOGY:  Partial left fallopian tube.   FLUIDS:  3200 mL.   URINE OUTPUT:  175 mL, 3 units packed red blood cells in OR.   ESTIMATED BLOOD LOSS:  3000 mL of clot in abdomen.   COMPLICATIONS:  None.   INDICATION:  I was called by the nurse on the floor regarding [name].  The patient had just been transferred to her bed in  her room and complained of acute pain and reported shortly thereafter  the blood pressure per report was 60s/40s.  Anesthesia was called and  their note is in the chart.  Upon my arrival, the patient was alert and  oriented and blood pressure was in the 100s/60s-70s and she was  tachycardic to the 120s.  An abdominal and pelvic ultrasound was ordered  and they were reportedly on their way.  The patient's abdomen was softly  distended with tenderness.  A CBC had been drawn and was pending.  The  ultrasound showed fluid in the abdomen that seemed to be accumulating  while the ultrasound was in progress.  I then discussed with the patient  my recommendation to return to the OR to explore abdomen to see if there  was any bleeding, and she was agreeable.  The patient became somewhat  hypotensive at this point again and the consent was  signed by her  husband, me, and the witnessing RN.  The patient was taken to operating  room 2 where she was then explored.   PROCEDURE IN DETAIL:  After the patient was taken to operating room 2,  she was placed under general anesthesia in the supine position.  The  Steri-Strips were removed after prepping the abdomen and appropriately  draping the patient in a sterile fashion.  The incision was then  reentered and copious amount of blood were noted in the abdomen.  The  uterus was exteriorized and upon exteriorizing the uterus, the suture  was dislodged from the right fallopian tube; however, it seemed more  apparent that the acute bleeding was more likely from the left fallopian  tube.  All bleeding was clamped with Kelly clamps for hemostasis.  The  abdomen was further explored and large amounts of clot were removed.  The intra-abdominal cavity was copiously irrigated until there was no  apparent bleeding noted.  The right fallopian tube  was then repaired  with 3-0 Vicryl via a stitch on each pedicle and running along the  mesosalpinx as well.  The same was done on the left side and a portion  of that tube was also excised and sent to pathology.  There was some  oozing noted from the right aspect of the uterine incision, which was  ligated with 3-0 Vicryl and found to be hemostatic.  At this point in  time, there was no active bleeding noted and the uterus was returned to  the intra-abdominal cavity.  There was more copious irrigation that was  performed and again no active bleeding was noted.  Surgicel was placed  on the uterine incision just prior to closing the peritoneum.  The  peritoneum was then repaired with 2-0 chromic via a running fashion.  There was some bleeding noted of what appeared to be the inferior  epigastric along the left rectus, which was ligated after clamping x2  and cutting.  There was good hemostasis.  This area was irrigated as  well. The fascia was repaired  with 0 Vicryl via a running stitch.  The  subcutaneous tissue was copiously irrigated and made hemostatic with a  Bovie.  The subcutaneous tissue was reapproximated using 3 interrupted  stitches of 2-0 plain and the skin was reapproximated using 3-0 Monocryl  via a subcuticular stitch.  0.5 inch Steri-Strips were applied with  Benzoin.  Sponge, lap, and needle count was correct.  The patient  tolerated the procedure well and was returned to the recovery room in  stable condition.      Osborn Coho, M.D.  Electronically Signed     AR/MEDQ  D:  01/20/2009  T:  01/21/2009  Job:  [id number]

## 2010-10-09 NOTE — Op Note (Signed)
NAMENEGIN, HEGG             ACCOUNT NO.:  [id number]   MEDICAL RECORD NO.:  [id number]          PATIENT TYPE:  INP   LOCATION:  9372                          [facility]   PHYSICIAN:  Osborn Coho, M.D.   DATE OF BIRTH:  1985-12-13   DATE OF PROCEDURE:  01/20/2009  DATE OF DISCHARGE:                               OPERATIVE REPORT   PREOPERATIVE DIAGNOSES:  1. 38-6/7 weeks.  2. Repeat cesarean section.  3. Permanent sterilization.   POSTOPERATIVE DIAGNOSES:  1. 38-6/7 weeks.  2. Repeat cesarean section.  3. Permanent sterilization.   PROCEDURES:  1. Repeat cesarean section.  2. Bilateral tubal ligation.   ATTENDING:  Osborn Coho, MD   ASSISTANT:  Jasmine Awe, CNM   ANESTHESIA:  Spinal.   FINDINGS:  Live female infant with Apgars of 9 at 1 minute and 9 at 5  minutes weighing 8 pounds and 4 ounces.   SPECIMEN TO PATHOLOGY:  Placenta to pathology and bilateral portions of  fallopian tubes to pathology.   FLUID:  3000 mL.   URINE OUTPUT:  300 mL.   ESTIMATED BLOOD LOSS:  1000 mL.   COMPLICATIONS:  None.   PROCEDURE IN DETAILS:  The patient was taken to the operating room.  After the risks, benefits and alternatives were discussed with the  patient, the patient verbalized understanding, consent signed and  witnessed.  The patient was given a spinal per Anesthesia and prepped  and draped in the normal sterile fashion in the supine position.  A  Pfannenstiel skin incision was made and carried down to the underlying  layer of fascia with a scalpel and the Bovie.  The fascia was excised  bilaterally in the midline and extended bilaterally with the Mayo  scissors.  Kocher clamps were placed on the superior aspect of the  fascial incision and the rectus muscle excised from the fascia.  The  same was done on the inferior aspect of the fascial incision.  The scar  tissue was excised in the midline and the peritoneum entered bluntly and  extended manually  and sharply with the Metzenbaum scissors.  The bladder  blade was placed and the bladder flap was created with the Metzenbaum  scissors.  The uterine incision was made with the scalpel and extended  bilaterally with the bandage scissors.  Membranes were ruptured upon  entry and clear fluid noted.  The infant was delivered in the vertex  presentation without difficulty and cord clamped and cut, and the infant  handed to the awaiting pediatricians.  Antibiotics had been administered  at the start of the case.  The placenta was removed via fundal massage  and the uterus cleared of all clots and debris.  The uterine incision  was repaired with 0 Vicryl via a running interlocking stitch and a  second imbricating layer was performed.  The intra-abdominal cavity was  copiously irrigated.  The left fallopian tube was identified and carried  out to its fimbriated end and grasped in the isthmic portion with the  Babcock.  Two free ties of 2-0 plain were used to ligate the left  fallopian tube, which was then excised and sent to Pathology.  The  remaining pedicle was cauterized with the Bovie.  The same was done on  the contralateral side after carrying the right fallopian tube out to  its fimbriated end.  Both specimens were sent to Pathology.  The  remaining pedicles were visualized once again and good hemostasis was  noted.  The uterine incision was noted to be hemostatic as well.  The  peritoneum was repaired with 2-0 chromic via a running stitch, and the  fascia was repaired via 0 Vicryl via a running stitch.  The subcutaneous  tissue was reapproximated using 2-0 plain via 3 interrupted stitches and  the skin was reapproximated using 3-0 Monocryl via a subcuticular stitch  and half-inch Steri-Strips with Benzoin were applied to the incision.  Sponge, lap and needle count was correct.  The patient tolerated the  procedure well and was currently being transferred to the recovery room  in good  condition.       Osborn Coho, M.D.  Electronically Signed     AR/MEDQ  D:  01/20/2009  T:  01/20/2009  Job:  [id number]
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
## 2010-10-12 NOTE — Discharge Summary (Signed)
NAMEALENCIA, Thomas             ACCOUNT NO.:  [id number]   MEDICAL RECORD NO.:  [id number]          PATIENT TYPE:  INP   LOCATION:  9128                          [facility]   PHYSICIAN:  Osborn Coho, M.D.   DATE OF BIRTH:  21-Dec-1985   DATE OF ADMISSION:  09/27/2005  DATE OF DISCHARGE:  10/01/2005                                 DISCHARGE SUMMARY   ADMITTING DIAGNOSES:  1.  Intrauterine pregnancy at 33-6/7th weeks.  2.  Twin gestation.  3.  Preterm labor.   PROCEDURE:  Spontaneous vaginal delivery of twin A, malpresentation of twin  B, primary low transverse cesarean section.   DISCHARGE DIAGNOSES:  1.  Intrauterine pregnancy at 33-6/7th weeks.  2.  Twin gestation in preterm labor.  3.  Spontaneous vaginal delivery of twin A, malpresentation of twin B.  4.  Primary low transverse cesarean section of twin B.  5.  Severe postoperative anemia with hemoglobin 5.4 and blood transfusion.   Penny Thomas is a 25 year old gravida 2, para 1-0-0-1 who presents with twin  gestation at 33-6/7th weeks with strong regular contractions.  She had  positive fetal fibronectin on September 04, 2005 and on Sep 26, 2005.  She  received betamethasone injection x2 April 11 and 12th.   On admission, the patient was noted to be 3 cm dilated.  Magnesium sulfate  was begun for tocolysis; however, the patient's labor continued and  magnesium sulfate was discontinued.  The patient's labor progressed normally  and she went on to deliver twin A vaginally with no complications.  Twin A  was noted to be 4 pounds 4 ounces with Apgar's scores of 8 at 1 minute and 9  at 5 minutes.  Following delivery of twin A, it was noted that twin B  remained in a breech presentation and by ultrasound was noted to be footling  breech.  Decision was made to proceed with primary low transverse cesarean  section of twin B.   Dr. Osborn Coho as surgeon delivered twin B, a baby boy, 4 pounds 13  ounces with Apgar scores of 9  at 1 minutes 9 at 5 minutes.  The baby's have  done very well since delivery and have remained in the full-term nursery.  They are nursing well and on this the patient's third postoperative day the  babies are ready for discharge.  The patient has had complications with  regard to severe postoperative anemia with a hemoglobin of 5.4.  The patient  also was noted to be febrile postoperatively.  She received a course of  Unasyn postoperatively until she has remained afebrile.   On this her third postoperative day, she has remained afebrile for greater  than 24 hours.  The patient did consent and receive blood transfusion of 4  units of packed red blood cells.  She tolerated this without any problems  and her hemoglobin post transfusion was up to 10.7.  The patient has felt  very well since transfusion.  She is up ambulating without any difficulty.  She has passed flatus and has had a bowel movement and is tolerating  p.o.  without any nausea and vomiting.  Her incision is clean, dry and intact.  Therefore, she is judged to be in stable condition for discharge with  discharge instructions per [facility] handout.   DISCHARGE MEDICATIONS:  1.  Motrin 600 mg p.o. q.6h. p.r.n. pain.  2.  Tylox 1-2 p.o. q.3-4h. p.r.n. pain.  3.  The patient will receive Depo-Provera 150 mg IM prior to discharge.  She      is considering Alfonse Ras IUD for her long-term contraceptive needs and she      will follow up at the office of [facility] in 6 weeks or p.r.n. for any      problems or concerns.      Rica Koyanagi, C.N.M.      Osborn Coho, M.D.  Electronically Signed    SDM/MEDQ  D:  10/01/2005  T:  10/02/2005  Job:  [id number]

## 2010-10-12 NOTE — H&P (Signed)
[name], CARBONI             ACCOUNT NO.:  [id number]   MEDICAL RECORD NO.:  [id number]          PATIENT TYPE:  MAT   LOCATION:  MATC                          [facility]   PHYSICIAN:  Crist Fat. Rivard, M.D. DATE OF BIRTH:  Mar 30, 1986   DATE OF PROCEDURE:  DATE OF DISCHARGE:                      STAT - MUST CHANGE TO CORRECT WORK TYPE   HISTORY OF PRESENT ILLNESS:  This is an 25 year old gravida 1, para 0 a 104-  3/7 weeks who presents with complaints of gastric fluid at 3:45 a.m.  She  reports mild contractions and positive fetal movement.  Pregnancy has been  followed by the midwife service and remarkable for:  (1) teen, (2) sickle  cell trait, (3) asthma, (4) Group B strep negative.   OBSTETRIC HISTORY:  Patient is prima gravida.   ALLERGIES:  None.   PAST MEDICAL HISTORY:  1.  History of varicella immunization.  2.  History of sickle cell trait.  3.  History of asthma for which she uses Albuterol occasionally.   FAMILY HISTORY:  Remarkable for grandfather with heart disease, grandmother  with hypertension, mother with phlebitis, mother with anemia, grandmother  and mother with thyroid disease, uncle with seizures disorder, mother with  depression.   PAST SURGICAL HISTORY:  Hernia repair.   GENETIC HISTORY:  Patient's first cousin with mental retardation.  Patient  and her father with sickle cell trait.   SOCIAL HISTORY:  The patient is single.  Father of the baby, Penny Thomas, is  involved and supportive.  She does not report a religious affiliation.  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   PRENATAL LABORATORIES:  Hemoglobin 13.3, blood pressure 0 positive, antibody  screen negative, sickle cell trait positive, RPR nonreactive, rubella  immune, hepatitis negative, HIV negative, Pap test benign reactive changes,  gonorrhea negative, chlamydia negative, cystic fibrosis negative, quad  screen normal, Group B strep negative.   HISTORY OF CURRENT  PREGNANCY:  Patient entered care at [redacted] weeks gestation.  She had an ultrasound at 18 weeks that was normal except for renal  pyelectasis bilaterally that remained present throughout two more  ultrasounds at 20 and 24 weeks.  She had leg pain at 25 weeks with a  negative Doppler study.  She had preterm labor at 29 weeks with negative  fetal fibronectin.  She had an ultrasound at 34 weeks showing normal fetal  renal pelves and then she had a negative Group B strep test at the end of  pregnancy.   OBJECTIVE:  VITAL SIGNS:  Stable, afebrile.  HEENT:  Within normal limits.  Thyroid not enlarged.  CHEST:  Clear to auscultation.  HEART:  Regular rate and rhythm.  ABDOMEN:  Gravid at 38 cm.  Vertex __________ FM shows reactive fetal heart  rate with uterine contractions every 8 minutes which are mild.  There is  positive gross pooling of light yellow fluid, positive for nitrazine.  Cervix is a loose 1 cm, 90% effaced, -1 station with vertex presentation.  Extremities within normal limits.   ASSESSMENT:  1.  Intrauterine pregnancy at 39-3/7 weeks.  2.  Premature rupture of membranes at term, light meconium.  3.  Prodromal contractions.   PLAN:  1.  Admit to birthing suite.  2.  Routine CNM orders.  3.  Patient desires epidural when in active labor.      MLW/MEDQ  D:  07/03/2004  T:  07/03/2004  Job:  [id number]

## 2010-10-12 NOTE — H&P (Signed)
Penny Thomas, Penny Thomas             ACCOUNT NO.:  [id number]   MEDICAL RECORD NO.:  [id number]          PATIENT TYPE:  INP   LOCATION:  9160                          [facility]ILITY:  [facility]   PHYSICIAN:  Hal Morales, M.D.DATE OF BIRTH:  11/12/1985   DATE OF ADMISSION:  09/08/2005  DATE OF DISCHARGE:                                HISTORY & PHYSICAL   HISTORY OF PRESENT ILLNESS:  [name] is a 25 year old gravida 2, para 1,  0, 0, 1 with a twin gestation at 41 weeks. Estimated date of delivery November 09, 2005 as determined by 9-week, 3-day ultrasound and confirmed with follow  up. The patient presents to maternity admissions with increasing  contractions and pelvic pressure since this a.m. She last took terbutaline  2.5 mg p.o. at 9 a.m. and contractions and pressure have increased since  that time.  She reports positive fetal movement x2. No vaginal bleeding, no rupture of  membranes. She was seen in MAU on September 04, 2005 for evaluation with  positive fetal fibronectin at that time. GC/Chlamydia negative. Group B  strep negative. Wet prep negative on September 04, 2005. She received  betamethasone on April 11 and September 05, 2005 and has been using terbutaline  p.o. at home with last dose at 9 a.m. She states she is resting most of the  time at home but is not formally on bedrest. Her last intercourse was  Wednesday, September 04, 2005. She has no complaint of burning or pain with  urination. No nausea, vomiting or diarrhea. She is not complaining of any  headache, visual changes or epigastric pain. Her pregnancy has been followed  by the [facility] at [facility] and is remarkable for:  1.  Questionable last menstrual period.  2.  Second pregnancy in less than 1 year.  3.  Adolescent.  4.  Asthma.  5.  Sickle cell trait.  6.  Twin gestation.   This patient was initially evaluated at the office of [facility] on April 09, 2005 at 9-weeks gestation as determined by pregnancy ultrasound. Follow up  ultrasound at 18 weeks identified twin gestation. Both twins are female and by  ultrasound evaluation today baby A is in cephalic presentation and baby B is  transverse. The patient's pregnancy has been remarkable for preterm  contractions with no cervical change. As stated above, she was evaluated in  the maternity admissions unit on September 04, 2005 and fetal fibronectin was  found to be positive at that time.   OBSTETRIC HISTORY:  In 2006 patient had a spontaneous vaginal delivery with  birth of a 6 pound female infant named Tyreek with no complications. This is  her second and current pregnancy.   PAST MEDICAL HISTORY:  Significant for sickle cell trait and asthma.   FAMILY HISTORY:  Paternal grandfather with a history of heart disease.  Maternal grandmother with history of chronic hypertension. Patient's mother  with phlebitis caused by birth control pills. Maternal grandmother with a  history of thyroid disease. Maternal grandfather with lung cancer. Maternal  great-grandmother with breast cancer. Patient's mother with depression.  GENETIC HISTORY:  Significant for sickle cell trait in both patient and her  father. Otherwise genetic history is unremarkable.  There are no babies known to be born with birth defects or any that died in  infancy.   ALLERGIES:  No known drug allergies.   HABITS:  She denies the use of tobacco, alcohol or illicit drugs.   PRENATAL LAB WORK:  On April 09, 2005 hemoglobin and hematocrit  12.3/36.5. Blood type O positive, antibody screen negative. Sickle cell  trait positive. VDRL nonreactive. Rubella immune. Pap smear within normal  limits.  GC/Chlamydia negative. CF testing negative. At 28 weeks a 1 hour glucose  challenge was within normal limits.   SOCIAL HISTORY:  [name] is a 25 year old single, African-American female.  The father of the baby, Herbert Spires, is involved and supportive. They do not  subscribe to a religious faith.   REVIEW OF  SYSTEMS:  The patient is 25 years old with a twin gestation at 3  weeks with preterm contractions.   PHYSICAL EXAMINATION:  VITAL SIGNS:  Are stable. Patient is afebrile.  HEENT:  Unremarkable.  HEART:  Regular rate and rhythm.  LUNGS:  Clear.  ABDOMEN:  Is gravid in its contour. Ultrasound examination shows twin A in  the cephalic presentation with a heart rate of 144, twin B in the transverse  presentation with head in the maternal left with a heart rate of 153 and the  umbilical cord is noted to be presenting. Abdomen is soft and nontender.  Negative for CVA tenderness bilaterally. Fetal heart rates are reactive x2.  Baseline is 140's with average long term variability and accelerations noted  for both twins.   The patient was medicated with three doses of Procardia 10 mg p.o. and  continued to have contractions and uterine irritability. On September 04, 2005  GC/Chlamydia are negative. Group B strep is negative. Wet prep negative.  Fetal fibronectin is positive. Clean catch urine today is within normal  limits.   CERVIX: Digital exam of the cervix finds it to be 1 cm dilated, 50% effaced,  with a presenting part high and ballottable.  EXTREMITIES:  Show no pathologic edema. Deep tendon reflexes are 1+ with no  clonus. There is no calf tenderness noted bilaterally.   ASSESSMENT:  Intrauterine pregnancy with twin gestation at 68 weeks. Preterm  contractions. Positive fetal fibronectin on September 04, 2005. Betamethasone x2  doses on April 11 and September 05, 2005.   PLAN:  Admit per Dr. Dierdre Forth for further tocolysis. Repeat  ultrasound for growth and position in the a.m.      Rica Koyanagi, C.N.M.      Hal Morales, M.D.  Electronically Signed    SDM/MEDQ  D:  09/08/2005  T:  09/08/2005  Job:  [id number]

## 2010-10-12 NOTE — Op Note (Signed)
Penny Thomas, Penny Thomas             ACCOUNT NO.:  [id number]   MEDICAL RECORD NO.:  [id number]          PATIENT TYPE:  INP   LOCATION:  9128                          FACILITY:  [facility]   PHYSICIAN:  Osborn Coho, M.D.   DATE OF BIRTH:  Dec 14, 1985   DATE OF PROCEDURE:  09/28/2005  DATE OF DISCHARGE:                                 OPERATIVE REPORT   PREOP DIAGNOSES:  1.  34 weeks twin gestation.  2.  Labor.  3.  Malpresentation of twin A.   POSTOPERATIVE DIAGNOSES:  1.  34 weeks twin gestation.  2.  Labor.  3.  Malpresentation of twin A.   PROCEDURE:  Primary low transverse C-section via Pfannenstiel skin incision.   ANESTHESIA:  Epidural.   ATTENDING:  Dr. Osborn Coho   ASSISTANT:  Cam Hai, C.N.M.   URINE OUTPUT:  400 mL.   ESTIMATED BLOOD LOSS:  800 mL   COMPLICATIONS:  None.  Placenta to pathology.   FINDINGS:  Live female infant with Apgars of 9 at one minute and 9 at five  minutes with a weight of 4 pounds 13 ounces.  Normal-appearing bilateral  ovaries and fallopian tubes.  The patient was taken to the operating room  for delivery of twins which were in vertex and transverse presentation.  Upon vaginal exam in OR, there was AROM of twin A with clear fluid.  Twin A  was delivered vaginally without difficulty and upon ultrasound after  delivery of twin A, twin B was noted to be in footling breech presentation.  Decision was made to proceed with C-section and the risks, benefits,  alternatives had previously been discussed with the patient.  The patient  was prepared for C-section and given a surgical level via the epidural and  prepped and draped in the normal sterile fashion.  A total of 20 mL of 1%  lidocaine was injected locally at the site of the incision secondary to a  mild amount of patient discomfort remaining.  Incision was performed and  carried down to the underlying layer of fascia with the scalpel.  The fascia  was excised bilaterally in the  midline with the Bovie and extended  bilaterally with the Mayo scissors.  Kocher clamps were placed on the  inferior aspect of the fascial incision and the rectus muscle excised from  the fascia.  The same was done in the superior aspect of fascial incision.  The rectus muscles separated in midline and the peritoneum entered bluntly  and extended manually.  Nesacaine was applied secondary to patient feeling  some discomfort at this point time.  The patient's comfort level  improved  and bladder blade was placed and bladder flap created with the Metzenbaum  scissors.  Uterine incision was made with the scalpel extended bilaterally  with the bandage scissors.  The infant was delivered via breech extraction,  footling breech presentation.  There were no problems at delivery and the  infant's cord was clamped and cut and the infant handed to the waiting  pediatricians.  The placenta was removed manually and the cord that was  remaining from  twin A's delivery was elevated through the vagina and out the  uterus and the placenta as one for twin A and twin B was sent to pathology.  The uterus was cleared of all clots and debris.  The incision was repaired  with 0 Vicryl in a running locked fashion.  A second imbricating layer was  performed.  The intra-abdominal cavity was copiously irrigated and the  adnexal findings were noted as above.  The peritoneum was repaired with 2-0  chromic in a running fashion and the fascia was repaired with 0 Vicryl in a  running fashion.  The subcutaneous tissue was reapproximated using 2-0 plain  and the skin was reapproximated using a 3-0 Monocryl via a subcuticular  stitch.  Steri-Strips were applied.  Sponge, lap and needle count was  correct.  The patient tolerated procedure well and was awaiting transfer to  the recovery room in good condition.      Osborn Coho, M.D.  Electronically Signed     AR/MEDQ  D:  09/28/2005  T:  09/30/2005  Job:  [id number]

## 2010-10-12 NOTE — Discharge Summary (Signed)
Penny Thomas, Penny Thomas             ACCOUNT NO.:  [id number]   MEDICAL RECORD NO.:  [id number]          PATIENT TYPE:  INP   LOCATION:  9149                          [facility]  [facility]   PHYSICIAN:  Crist Fat. Rivard, M.D. DATE OF BIRTH:  Jul 26, 1985   DATE OF ADMISSION:  09/08/2005  DATE OF DISCHARGE:  09/16/2005                                 DISCHARGE SUMMARY   ADMISSION DIAGNOSES:  1.  Intrauterine twin pregnancy at 31 weeks.  2.  Preterm labor.  3.  Positive fetal fibronectin on April 11.  4.  Sickle cell trait.  5.  Asthma.  6.  Second pregnancy in less than 1 year.   DISCHARGE DIAGNOSES:  1.  Twin pregnancy at 32-1/7 weeks.  2.  Preterm labor controlled on magnesium.  3.  Anemia.  4.  History of positive fetal fibronectin on September 04, 2005.   PROCEDURES:  1.  Magnesium sulfate therapy.  2.  Transfusion of 2 units packed red blood cells.   HOSPITAL COURSE:  Penny Thomas is a 25 year old gravida 2, para 1-0-0-1 at 68  weeks with a twin gestation who presented with increasing contractions and  pelvic pressure on September 08, 2005. She had been seen in maternity admissions  unit on April 11. She had a positive fetal fibronectin at that point and  negative cultures. She was sent home on bed rest and was using terbutaline  p.o. She was noted at that time to be contracting, and cervix was 1, 50%,  with presenting part out of the pelvis. She was admitted for Procardia,  though which she continued to contract despite 3 doses of Procardia.  Therefore, she was admitted for magnesium sulfate therapy.  Pregnancy has  been remarkable for:  1.  Questionable last menstrual period.  2.  Second pregnancy in less than a year.  3.  Adolescent.  4.  Asthma.  5.  Sickle cell trait.  6.  Twins.  7.  Positive fetal fibronectin on September 04, 2005, status post betamethasone      on April 11 and April 12.   The patient was begun on magnesium on the evening of September 08, 2005.  By the  next day, she  was having significant constipation.  Her cervix was 1 to 2,  50% with the presenting part out of the pelvis.  Ultrasound was ordered.  Her hemoglobin was 6.9 on evaluation.  It had been 12.3 at her new OB visit  and 7.8 at her 28-week visit.  There had been initially seen a cord  presenting on ultrasound; however, on repeat ultrasound, there was no cord  presenting now.  Twin A on formal evaluation was vertex with no cord  presenting.  Fluid was 6.2 with vertical pocket.  Growth was 50 to 75th  percentile.  B was a transverse presentation with normal fluid and 50 to  75th percentile.  Due to the patient's significant anemia, the patient was  consented for 2 units of blood.  This was received on April 16. A bowel  regimen was implemented.  The patient improved somewhat after the blood  transfusion. She had significant increase in urine output over the next  several days, but her intake also matched her output.  Hemoglobin on next  evaluation on April 18 was 8.3.  Mild contractions continued.  She was  determined to have iron-deficiency anemia.  She had a negative hemoccult.  Glycerine suppository was implemented on a p.r.n. basis.  The patient had  received some terbutaline occasionally.   By the morning of September 15, 2005, the patient was doing well .  She was  having some irregular contractions. Fetal heart rate was reactive on both  twins.  Dr. Stefano Gaul discussed plan of care with the patient, and decision  was made to discontinue the magnesium.   By the morning of April 23, the patient was aware of approximately 3  contractions per hour but was definitely having about every-5-minute  contractions, although they were very mild.  Her intake and output balance  was fine.  She had a normal bowel movement the day before.  She had received  some Procardia on April 22. Fetal heart rate was reassuring.  Dr. Estanislado Pandy  checked the patient's cervix.  After she had been off magnesium for 24  hours,  the cervix was closed, long; the presenting part was high.  The  decision was made to discharge the patient home per Dr. Estanislado Pandy.   DISCHARGE INSTRUCTIONS:  1.  The patient was to be on bed rest with bathroom privileges.  2.  Preterm labor precautions and labor precautions were reviewed.   DISCHARGE MEDICATIONS:  1.  Procardia 30 mg XL daily.  2.  Prenatal vitamin 1 p.o. daily.  3.  Iron supplement 1 p.o. twice daily.   Discharge followup will occur on September 19, 2005, for visit and repeat fetal  fibronectin.      Renaldo Reel Emilee Hero, C.N.M.      Crist Fat Rivard, M.D.  Electronically Signed    VLL/MEDQ  D:  09/16/2005  T:  09/17/2005  Job:  [id number]

## 2011-02-13 LAB — CBC
HCT: 40.4
Hemoglobin: 14
RDW: 13.7
WBC: 5.9

## 2011-02-18 LAB — POCT URINALYSIS DIP (DEVICE)
Bilirubin Urine: NEGATIVE
Nitrite: NEGATIVE
Operator id: 239701
Protein, ur: NEGATIVE
pH: 5

## 2011-02-18 LAB — POCT PREGNANCY, URINE
Operator id: 239701
Preg Test, Ur: NEGATIVE

## 2011-02-26 LAB — POCT PREGNANCY, URINE: Preg Test, Ur: NEGATIVE

## 2011-05-24 ENCOUNTER — Emergency Department (INDEPENDENT_AMBULATORY_CARE_PROVIDER_SITE_OTHER)
Admission: EM | Admit: 2011-05-24 | Discharge: 2011-05-24 | Disposition: A | Discharge status: Home / Self Care | Payer: Self-pay | Source: Home / Self Care | Attending: Family Medicine | Admitting: Family Medicine

## 2011-05-24 ENCOUNTER — Encounter: Payer: Self-pay | Admitting: Emergency Medicine

## 2011-05-24 DIAGNOSIS — J111 Influenza due to unidentified influenza virus with other respiratory manifestations: Secondary | ICD-10-CM

## 2011-05-24 DIAGNOSIS — R6889 Other general symptoms and signs: Secondary | ICD-10-CM

## 2011-05-24 LAB — POCT URINALYSIS DIP (DEVICE)
Bilirubin Urine: NEGATIVE
Glucose, UA: NEGATIVE mg/dL
Hgb urine dipstick: NEGATIVE
Ketones, ur: NEGATIVE mg/dL
Nitrite: NEGATIVE

## 2011-05-24 MED ORDER — ONDANSETRON 4 MG PO TBDP
4.0000 mg | ORAL_TABLET | Freq: Once | ORAL | Status: AC
  Administered 2011-05-24: 4 mg via ORAL

## 2011-05-24 MED ORDER — IBUPROFEN 800 MG PO TABS: 800.0000 mg | ORAL_TABLET | Freq: Three times a day (TID) | ORAL | Status: AC

## 2011-05-24 MED ORDER — ONDANSETRON 4 MG PO TBDP
ORAL_TABLET | ORAL | Status: AC
  Filled 2011-05-24: qty 1

## 2011-05-24 MED ORDER — DEXTROMETHORPHAN POLISTIREX 30 MG/5ML PO LQCR: 60.0000 mg | ORAL | Status: AC | PRN

## 2011-05-24 MED ORDER — ONDANSETRON HCL 4 MG PO TABS: 4.0000 mg | ORAL_TABLET | Freq: Four times a day (QID) | ORAL | Status: AC

## 2011-05-24 NOTE — ED Provider Notes (Signed)
History     CSN: [id number]  Arrival date & time 05/24/11  0840   First MD Initiated Contact with Patient 05/24/11 [phone]      Chief Complaint  Patient presents with  . Generalized Body Aches    (Consider location/radiation/quality/duration/timing/severity/associated sxs/prior treatment) Patient is a 25 y.o. female presenting with URI. The history is provided by the patient.  URI The primary symptoms include fever, fatigue, nausea and myalgias. Primary symptoms do not include cough or vomiting. The current episode started 2 days ago. This is a new problem. The problem has been gradually worsening.  The onset of the illness is associated with exposure to sick contacts. Symptoms associated with the illness include chills, sinus pressure, congestion and rhinorrhea.    History reviewed. No pertinent past medical history.  History reviewed. No pertinent past surgical history.  History reviewed. No pertinent family history.  History  Substance Use Topics  . Smoking status: Not on file  . Smokeless tobacco: Not on file  . Alcohol Use: Not on file    OB History    Grav Para Term Preterm Abortions TAB SAB Ect Mult Living                  Review of Systems  Constitutional: Positive for fever, chills and fatigue.  HENT: Positive for congestion, rhinorrhea and sinus pressure.   Respiratory: Negative for cough and shortness of breath.   Gastrointestinal: Positive for nausea. Negative for vomiting.  Genitourinary: Negative.   Musculoskeletal: Positive for myalgias.  Skin: Negative.     Allergies  Review of patient's allergies indicates not on file.  Home Medications   Current Outpatient Rx  Name Route Sig Dispense Refill  . DEXTROMETHORPHAN POLISTIREX ER 30 MG/5ML PO LQCR Oral Take 10 mLs (60 mg total) by mouth as needed for cough. 89 mL 0  . IBUPROFEN 800 MG PO TABS Oral Take 1 tablet (800 mg total) by mouth 3 (three) times daily. 30 tablet 0  . ONDANSETRON HCL 4 MG PO  TABS Oral Take 1 tablet (4 mg total) by mouth every 6 (six) hours. 8 tablet 0    BP 122/84  Pulse 94  Temp(Src) 98.9 F (37.2 C) (Oral)  Resp 18  SpO2 100%  LMP 05/10/2011  Physical Exam  Nursing note and vitals reviewed. Constitutional: She appears well-developed and well-nourished.  HENT:  Head: Normocephalic.  Right Ear: External ear normal.  Left Ear: External ear normal.  Mouth/Throat: Oropharynx is clear and moist.  Eyes: Conjunctivae are normal. Pupils are equal, round, and reactive to light.  Neck: Normal range of motion. Neck supple.  Cardiovascular: Normal rate, regular rhythm, normal heart sounds and intact distal pulses.   Pulmonary/Chest: Effort normal and breath sounds normal.  Abdominal: Soft. Bowel sounds are normal. She exhibits no mass. There is no rebound and no guarding.  Lymphadenopathy:    She has no cervical adenopathy.  Skin: Skin is warm and dry.    ED Course  Procedures (including critical care time)   Labs Reviewed  POCT URINALYSIS DIP (DEVICE)  POCT PREGNANCY, URINE  POCT URINALYSIS DIPSTICK  POCT PREGNANCY, URINE   No results found.   1. Influenza-like illness       MDM  U/a neg.        Barkley Bruns, MD 05/24/11 [phone]

## 2011-05-24 NOTE — ED Notes (Signed)
Pt c/o fever 2 days ago. Today has weakness, congestion, aches, headache, cough and nasal drainage. C/o nausea and cramping in lower part of stomach.

## 2011-06-27 ENCOUNTER — Encounter: Payer: Self-pay | Admitting: Women's Health

## 2011-06-27 ENCOUNTER — Other Ambulatory Visit (HOSPITAL_COMMUNITY)
Admission: RE | Admit: 2011-06-27 | Discharge: 2011-06-27 | Disposition: A | Discharge status: Home / Self Care | Payer: BC Managed Care – PPO | Source: Ambulatory Visit | Attending: Obstetrics and Gynecology | Admitting: Obstetrics and Gynecology

## 2011-06-27 ENCOUNTER — Ambulatory Visit (INDEPENDENT_AMBULATORY_CARE_PROVIDER_SITE_OTHER): Payer: BC Managed Care – PPO | Admitting: Women's Health

## 2011-06-27 VITALS — BP 124/78 | Ht 66.25 in | Wt 147.0 lb

## 2011-06-27 DIAGNOSIS — N926 Irregular menstruation, unspecified: Secondary | ICD-10-CM

## 2011-06-27 DIAGNOSIS — Z01419 Encounter for gynecological examination (general) (routine) without abnormal findings: Secondary | ICD-10-CM

## 2011-06-27 DIAGNOSIS — N938 Other specified abnormal uterine and vaginal bleeding: Secondary | ICD-10-CM

## 2011-06-27 DIAGNOSIS — N898 Other specified noninflammatory disorders of vagina: Secondary | ICD-10-CM

## 2011-06-27 DIAGNOSIS — N946 Dysmenorrhea, unspecified: Secondary | ICD-10-CM

## 2011-06-27 DIAGNOSIS — Z833 Family history of diabetes mellitus: Secondary | ICD-10-CM

## 2011-06-27 LAB — URINALYSIS W MICROSCOPIC + REFLEX CULTURE
Bilirubin Urine: NEGATIVE
Glucose, UA: NEGATIVE mg/dL
Hgb urine dipstick: NEGATIVE
Ketones, ur: NEGATIVE mg/dL
Leukocytes, UA: NEGATIVE
Nitrite: NEGATIVE
Protein, ur: NEGATIVE mg/dL
Specific Gravity, Urine: 1.02 (ref 1.005–1.030)
Urobilinogen, UA: 0.2 mg/dL (ref 0.0–1.0)
pH: 5 (ref 5.0–8.0)

## 2011-06-27 LAB — CBC WITH DIFFERENTIAL/PLATELET
Basophils Absolute: 0 10*3/uL (ref 0.0–0.1)
HCT: 36.9 % (ref 36.0–46.0)
Lymphocytes Relative: 43 % (ref 12–46)
Lymphs Abs: 2 10*3/uL (ref 0.7–4.0)
Monocytes Absolute: 0.3 10*3/uL (ref 0.1–1.0)
Neutro Abs: 2.3 10*3/uL (ref 1.7–7.7)
Platelets: 279 10*3/uL (ref 150–400)
RBC: 4.51 MIL/uL (ref 3.87–5.11)
RDW: 14.9 % (ref 11.5–15.5)
WBC: 4.7 10*3/uL (ref 4.0–10.5)

## 2011-06-27 LAB — WET PREP FOR TRICH, YEAST, CLUE
Trich, Wet Prep: NONE SEEN
Yeast Wet Prep HPF POC: NONE SEEN

## 2011-06-27 LAB — TSH: TSH: 1.574 u[IU]/mL (ref 0.350–4.500)

## 2011-06-27 LAB — GLUCOSE, RANDOM: Glucose, Bld: 93 mg/dL (ref 70–99)

## 2011-06-27 MED ORDER — IBUPROFEN 600 MG PO TABS: 600.0000 mg | ORAL_TABLET | Freq: Four times a day (QID) | ORAL | Status: DC | PRN

## 2011-06-27 MED ORDER — METRONIDAZOLE 500 MG PO TABS: 500.0000 mg | ORAL_TABLET | Freq: Two times a day (BID) | ORAL | Status: AC

## 2011-06-27 NOTE — Patient Instructions (Signed)
Call if spotting continues,  Or cycle less than 21 days from day 1 to day 1 of next cycle

## 2011-06-27 NOTE — Progress Notes (Signed)
Penny Thomas 1985-12-12 [id number]    History:    The patient presents for annual exam.  Cycles usually every 21 days for 7 days, some months has spotting/BTL. Reports history of normal Paps - last one in 2010.   Past medical history, past surgical history, family history and social history were all reviewed and documented in the EPIC chart. History of gestational diabetes, sickle cell trait. [place] delivered sons ages 26, twins age 26 and a 1-year-old.  ROS:  A  ROS was performed and pertinent positives and negatives are included in the history.  Exam:  Filed Vitals:   06/27/11 0945  BP: 124/78    General appearance:  Normal Head/Neck:  Normal, without cervical or supraclavicular adenopathy. Thyroid:  Symmetrical, normal in size, without palpable masses or nodularity. Respiratory  Effort:  Normal  Auscultation:  Clear without wheezing or rhonchi Cardiovascular  Auscultation:  Regular rate, without rubs, murmurs or gallops  Edema/varicosities:  Not grossly evident Abdominal  Soft,nontender, without masses, guarding or rebound.  Liver/spleen:  No organomegaly noted  Hernia:  None appreciated  Skin  Inspection:  Grossly normal  Palpation:  Grossly normal Neurologic/psychiatric  Orientation:  Normal with appropriate conversation.  Mood/affect:  Normal  Genitourinary    Breasts: Examined lying and sitting.     Right: Without masses, retractions, discharge or axillary adenopathy.     Left: Without masses, retractions, discharge or axillary adenopathy.   Inguinal/mons:  Normal without inguinal adenopathy  External genitalia:  Normal  BUS/Urethra/Skene's glands:  Normal  Bladder:  Normal  Vagina:  Normal  Cervix:  Normal  Uterus:   normal in size, shape and contour.  Midline and mobile  Adnexa/parametria:     Rt: Without masses or tenderness.   Lt: Without masses or tenderness.  Anus and perineum: Normal  Digital rectal exam: Normal sphincter tone  without palpated masses or tenderness  Assessment/Plan:  26 y.o. MBF G3 P4  for annual exam.   Dysmenorrhea/BTL Irregular cycles/spotting BV  Plan: CBC, TSH, glucose, UA and Pap. Flagyl 500 by mouth twice a day for 7 days prescription, proper use given and reviewed alcohol precautions. SBE's, exercise, calcium rich diet, MVI daily encouraged. Reviewed menstrual calendar, instructed to report if cycles are less than 21 days, spotting between her cycles will proceed with a sonohysterogram with Dr Audie Box. Motrin 600 every 8 hours when necessary for dysmenorrhea.   Harrington Challenger [facility], 2:51 PM 06/27/2011

## 2011-06-28 LAB — PROLACTIN: Prolactin: 7.2 ng/mL

## 2011-07-01 ENCOUNTER — Telehealth: Payer: Self-pay | Admitting: *Deleted

## 2011-07-01 DIAGNOSIS — N946 Dysmenorrhea, unspecified: Secondary | ICD-10-CM

## 2011-07-01 NOTE — Telephone Encounter (Signed)
Left second message instructed to call back.

## 2011-07-01 NOTE — Telephone Encounter (Signed)
Telephone call left message to call back in regards to problem.

## 2011-07-01 NOTE — Telephone Encounter (Signed)
Pt had office visit on 1/31 and giving motrin 600 mg 1 tablet to take every 6 hours as needed. Pt said no relief from cramping during menstrual. Pt said she even tried motrin 800 mg that her gandmother gave her an still no relief. Please advise

## 2011-07-03 MED ORDER — TRAMADOL HCL 50 MG PO TABS: 100.0000 mg | ORAL_TABLET | Freq: Four times a day (QID) | ORAL | Status: DC | PRN

## 2011-07-03 MED ORDER — NORGESTIMATE-ETH ESTRADIOL 0.25-35 MG-MCG PO TABS: 1.0000 | ORAL_TABLET | Freq: Every day | ORAL | Status: DC

## 2011-07-03 NOTE — Telephone Encounter (Signed)
Telephone call to discuss problem of menstrual cramps. Reviewed cannot use Vicodin long-term, narcotic with addicative properties. Discussed other medications would like to try tramadol, and would like to go back on birth control pills. Has used in the past without a problem did review slight risk for blood clots and strokes. BTL/will use for cycle control/dysmenorrhea and menorrhagia. Will try Ortho-Cyclen, has used in the past.

## 2011-07-29 ENCOUNTER — Telehealth: Payer: Self-pay | Admitting: *Deleted

## 2011-07-29 MED ORDER — HYDROCODONE-ACETAMINOPHEN 5-500 MG PO TABS: 1.0000 | ORAL_TABLET | Freq: Two times a day (BID) | ORAL | Status: AC | PRN

## 2011-07-29 NOTE — Telephone Encounter (Signed)
Left for pt to call.pt called c/o ultram not working? Cramps getting worse?

## 2011-07-29 NOTE — Telephone Encounter (Signed)
Telephone call to review request. Has had a BTL, increased menstrual cramps. Started on birth control pills this month, no relief yet. Wil continue pills to see if this will help next periods, states needs something to help with rest/dysmenorrhea.  Vicodin 5/500  1 twice a day when necessary #15 one refill. Reviewed addictive properties of medication. Instructed to call if cycles do not improve with less pain.  Candise Bowens, please call into [facility] on [address], thanks

## 2011-07-29 NOTE — Telephone Encounter (Signed)
rx called in to [facility] per pt request.

## 2011-07-29 NOTE — Telephone Encounter (Signed)
Pt called stating the ultram 50 mg is not working anymore with her cramping giving on 07/01/11. Pt said her LMP: was last Friday. Cramping is getting worse and starting to pass small clots. Pt wants to know if there is something stronger she could have. Please advise

## 2011-08-02 ENCOUNTER — Ambulatory Visit (INDEPENDENT_AMBULATORY_CARE_PROVIDER_SITE_OTHER): Payer: BC Managed Care – PPO | Admitting: Women's Health

## 2011-08-02 ENCOUNTER — Other Ambulatory Visit: Payer: Self-pay | Admitting: Gynecology

## 2011-08-02 ENCOUNTER — Ambulatory Visit (INDEPENDENT_AMBULATORY_CARE_PROVIDER_SITE_OTHER): Payer: BC Managed Care – PPO

## 2011-08-02 ENCOUNTER — Other Ambulatory Visit: Payer: Self-pay | Admitting: *Deleted

## 2011-08-02 ENCOUNTER — Encounter: Payer: Self-pay | Admitting: Women's Health

## 2011-08-02 DIAGNOSIS — N949 Unspecified condition associated with female genital organs and menstrual cycle: Secondary | ICD-10-CM

## 2011-08-02 DIAGNOSIS — R1032 Left lower quadrant pain: Secondary | ICD-10-CM

## 2011-08-02 DIAGNOSIS — R102 Pelvic and perineal pain: Secondary | ICD-10-CM

## 2011-08-02 NOTE — Progress Notes (Signed)
Patient ID: Penny Thomas, female   DOB: Jan 12, 1986, 26 y.o.   MRN: [id number] Called the on call doctor due to left lower quadrant pain early this a.m. and was instructed to come to office for ultrasound. Has had increasing dysmenorrhea and pelvic pain since last C-section with BTL. After last C-section had to be returned to the OR for internal bleeding/received transfusions. Was given a prescription for Vicodin for the dysmenorrhea in past. Has used only one out of the prescription. States prior to last C-section had minimal dysmenorrhea. Appears uncomfortable, holding the left lower abdominal side. Rates pain at 8/10. States pain is a 9/10 with her cycle. She has finished one week of Sprintec. Denies a fever, nausea/vomiting/diarrhea or constipation. Denies any discharge or UTI symptoms or changes. One partner with history of negative cultures.  Exam: No tenderness, rebound or radiation of pain when right lower abdomen palpated. Tenderness with both pressure and rebound on left lower quadrant. No suprapubic tenderness.  Ultrasound: No uterine abnormalities noted. Both ovaries appeared normal. A small amount of free fluid is seen in the cul-de-sac. Endometrium is 4.1 mm.  Left lower quadrant pain/dysmenorrhea - questionable adhesions.  Plan: Continue Sprintec, Vicodin every 8 hours today again reviewed hazards of addiction. Reviewed normal ultrasound results. Review possible adhesions-type pain. Reviewed GI referral if continued left lower quadrant pain. Note written to miss work today.

## 2011-08-09 IMAGING — CR DG CHEST 2V
2 series · 2 of 2 positions shown · non-contrast
Comparison: 07/15/2009

CLINICAL DATA: Fever

CHEST - 2 VIEW

[view not recorded (1 of 2)]
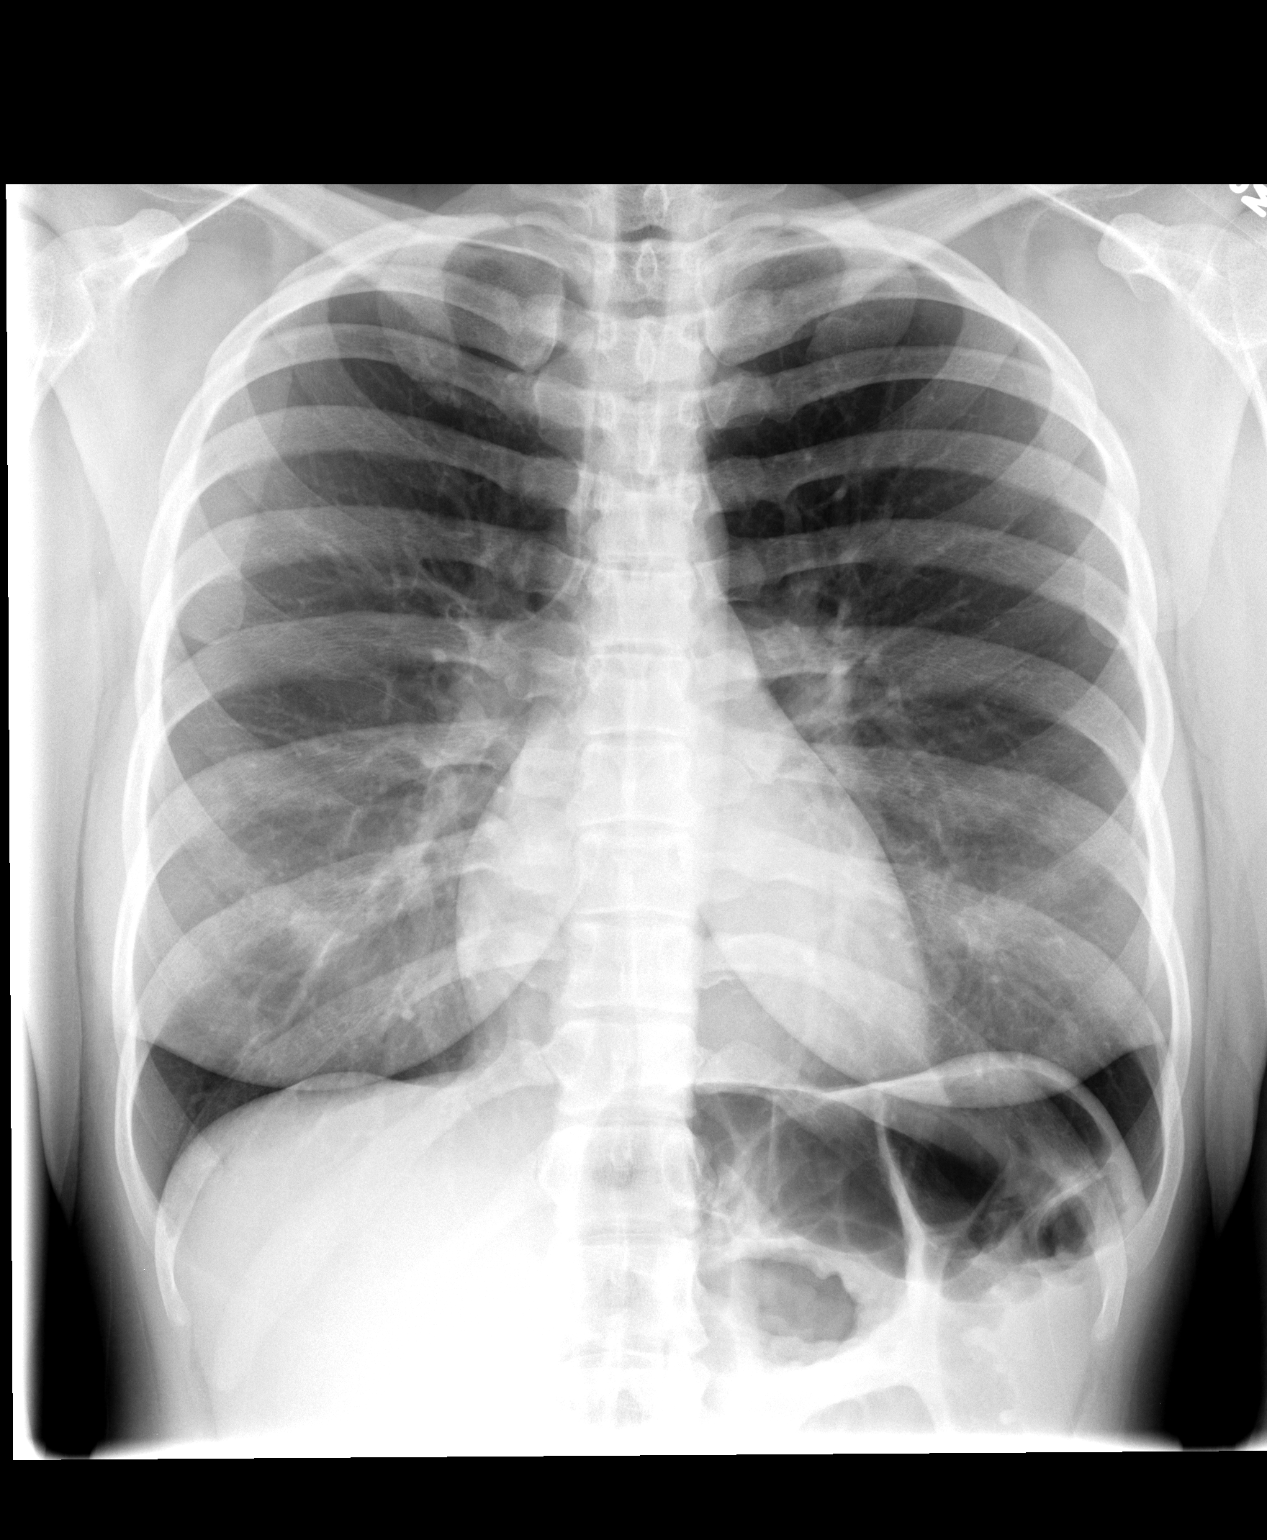

[view not recorded (2 of 2)]
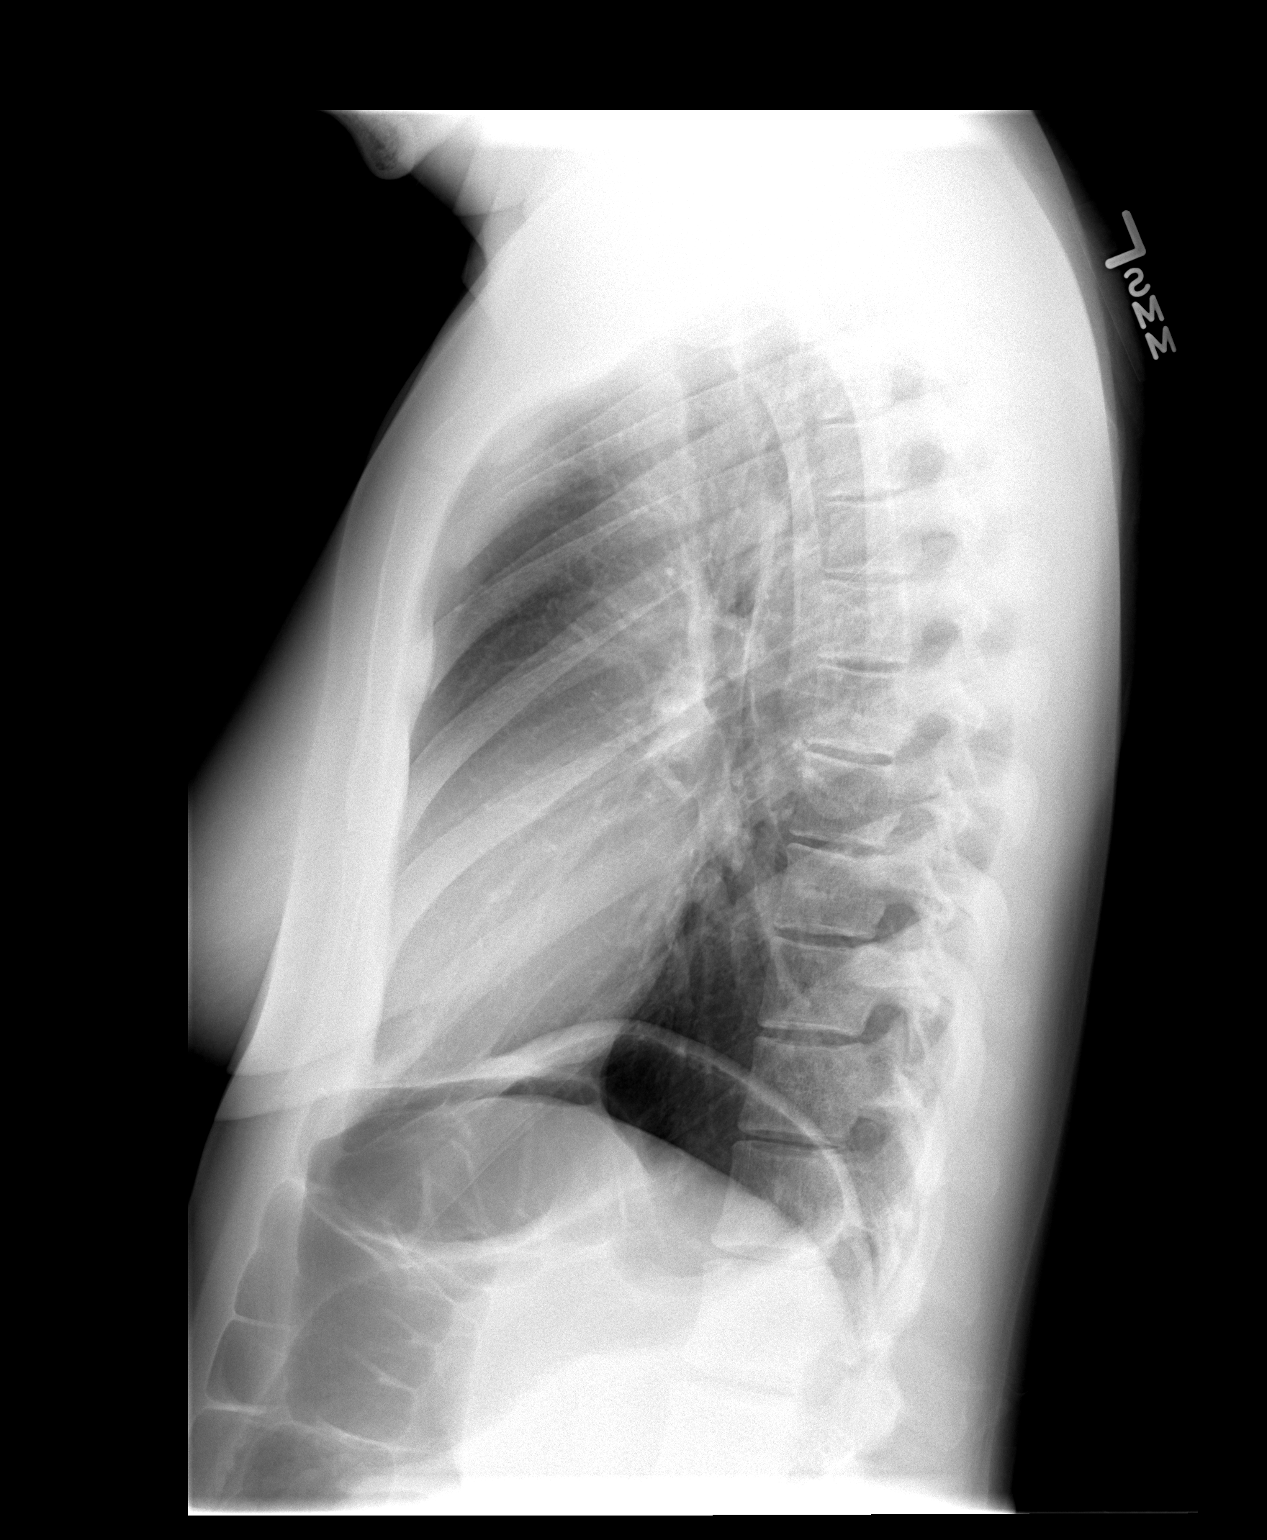

[2 of 2 positions shown; findings below may reference images not displayed]

FINDINGS: Cardiomediastinal silhouette is within normal limits.
The lungs are clear. No pleural effusion.  No pneumothorax.  No
acute osseous abnormality. Bilateral nipple shadows project over
the lower lobes.
IMPRESSION: No active cardiopulmonary disease.

## 2011-08-13 ENCOUNTER — Encounter (HOSPITAL_COMMUNITY): Payer: Self-pay

## 2011-08-13 ENCOUNTER — Emergency Department (INDEPENDENT_AMBULATORY_CARE_PROVIDER_SITE_OTHER)
Admission: EM | Admit: 2011-08-13 | Discharge: 2011-08-13 | Disposition: A | Discharge status: Home / Self Care | Payer: BC Managed Care – PPO | Source: Home / Self Care | Attending: Family Medicine | Admitting: Family Medicine

## 2011-08-13 DIAGNOSIS — J019 Acute sinusitis, unspecified: Secondary | ICD-10-CM

## 2011-08-13 HISTORY — DX: Other seasonal allergic rhinitis: J30.2

## 2011-08-13 MED ORDER — METHYLPREDNISOLONE ACETATE 40 MG/ML IJ SUSP
80.0000 mg | Freq: Once | INTRAMUSCULAR | Status: AC
  Administered 2011-08-13: 80 mg via INTRAMUSCULAR

## 2011-08-13 MED ORDER — FLUTICASONE PROPIONATE 50 MCG/ACT NA SUSP: 2.0000 | Freq: Every day | NASAL | Status: DC

## 2011-08-13 MED ORDER — METHYLPREDNISOLONE ACETATE 80 MG/ML IJ SUSP
INTRAMUSCULAR | Status: AC
  Filled 2011-08-13: qty 1

## 2011-08-13 MED ORDER — AMOXICILLIN-POT CLAVULANATE 875-125 MG PO TABS: 1.0000 | ORAL_TABLET | Freq: Two times a day (BID) | ORAL | Status: AC

## 2011-08-13 NOTE — ED Notes (Signed)
C/o post nasal drainage, prod. Cough of green sputum, green nasal secretions and chest hurts from coughing.  Sx for 1 1/2 weeks.  Denies fever.

## 2011-08-13 NOTE — Discharge Instructions (Signed)
Drink plenty of fluids as discussed, use medicine as prescribed, and mucinex or delsym for cough. Return or see your doctor if further problems °

## 2011-08-13 NOTE — ED Provider Notes (Signed)
History     CSN: [id number]  Arrival date & time 08/13/11  4098   First MD Initiated Contact with Patient 08/13/11 [phone]      Chief Complaint  Patient presents with  . Sinusitis    (Consider location/radiation/quality/duration/timing/severity/associated sxs/prior treatment) Patient is a 26 y.o. female presenting with sinusitis. The history is provided by the patient.  Sinusitis  This is a new problem. The current episode started more than 1 week ago. The problem has been gradually worsening. There has been no fever. The patient is experiencing no pain. Associated symptoms include congestion, sinus pressure and cough. Associated symptoms comments: Green pnd and sputum.h/o spring allergies..    Past Medical History  Diagnosis Date  . Asthma   . Migraines   . Hemorrhage in uterus 01-20-09  . Sickle cell trait   . Blood transfusion 2007  . Blood transfusion 2010  . Seasonal allergies     Past Surgical History  Procedure Date  . Cesarean section 09-28-05  . Cesarean section 01-20-09  . Polyp removal 5-08    ?CERVICAL OR UTERINE POLYP  . Tubal ligation 01-20-09    DONE WITH C-SECTION    No family history on file.  History  Substance Use Topics  . Smoking status: Never Smoker   . Smokeless tobacco: Never Used  . Alcohol Use: No    OB History    Grav Para Term Preterm Abortions TAB SAB Ect Mult Living   4 3       1        Review of Systems  Constitutional: Negative.   HENT: Positive for congestion, rhinorrhea, postnasal drip and sinus pressure.   Respiratory: Positive for cough.   Cardiovascular: Negative.     Allergies  Review of patient's allergies indicates no known allergies.  Home Medications   Current Outpatient Rx  Name Route Sig Dispense Refill  . ALBUTEROL IN Inhalation Inhale into the lungs.    . CETIRIZINE HCL 10 MG PO TABS Oral Take 10 mg by mouth daily.    Marland Kitchen NORGESTIMATE-ETH ESTRADIOL 0.25-35 MG-MCG PO TABS Oral Take 1 tablet by mouth daily. 84  tablet 4  . AMOXICILLIN-POT CLAVULANATE 875-125 MG PO TABS Oral Take 1 tablet by mouth 2 (two) times daily. 20 tablet 0  . FLUTICASONE PROPIONATE 50 MCG/ACT NA SUSP Nasal Place 2 sprays into the nose daily. 1 g 2  . IBUPROFEN 600 MG PO TABS Oral Take 1 tablet (600 mg total) by mouth every 6 (six) hours as needed. 60 tablet 1  . PRESCRIPTION MEDICATION  MIGRAINE RX-IFONETHEPT      BP 115/78  Pulse 83  Temp(Src) 98.9 F (37.2 C) (Oral)  Resp 17  SpO2 97%  LMP 07/26/2011  Physical Exam  Nursing note and vitals reviewed. Constitutional: She is oriented to person, place, and time. She appears well-developed and well-nourished.  HENT:  Head: Normocephalic.  Right Ear: External ear normal.  Left Ear: External ear normal.  Nose: Nose normal.  Mouth/Throat: Oropharynx is clear and moist.  Eyes: Conjunctivae are normal. Pupils are equal, round, and reactive to light.  Neck: Normal range of motion. Neck supple.  Cardiovascular: Normal rate, regular rhythm, normal heart sounds and intact distal pulses.   Pulmonary/Chest: Breath sounds normal. She has no wheezes.  Neurological: She is alert and oriented to person, place, and time.  Skin: Skin is warm and dry.    ED Course  Procedures (including critical care time)  Labs Reviewed - No data to display  No results found.   1. Sinusitis acute       MDM          Linna Hoff, MD 08/13/11 [phone]

## 2011-09-30 ENCOUNTER — Other Ambulatory Visit: Payer: Self-pay | Admitting: Women's Health

## 2011-09-30 DIAGNOSIS — N946 Dysmenorrhea, unspecified: Secondary | ICD-10-CM

## 2011-09-30 MED ORDER — PROMETHAZINE HCL 25 MG PO TABS: 25.0000 mg | ORAL_TABLET | Freq: Four times a day (QID) | ORAL | Status: DC | PRN

## 2011-09-30 NOTE — Telephone Encounter (Signed)
Left message to call in regards to pain management. Will call in Ultram and for pain

## 2011-09-30 NOTE — Telephone Encounter (Signed)
Message left in regards to medication request

## 2011-09-30 NOTE — Telephone Encounter (Signed)
Pt would like you to call her, call # [phone]

## 2011-09-30 NOTE — Progress Notes (Signed)
Telephone call to discuss dysmenorrhea. Has been using ultram with not much relief, also states feel she has a virus right now with nausea and vomiting for 2 days, able to work today.  She does have 5 children none of which are sick. Has had a BTL. Has been on Ortho-Cyclen  for 4 months with minimal relief of dysmenorrhea. Ablation had been discussed at an earlier appointment and is questioning if she should proceed. Will put information in the mail, schedule sonohysterogram with Dr. Audie Box prior, Phenergan 25 every 8 hours when necessary for nausea.

## 2011-10-01 ENCOUNTER — Telehealth: Payer: Self-pay | Admitting: Women's Health

## 2011-10-01 NOTE — Telephone Encounter (Signed)
Patient informed that Her Option Ablation covered 100%.  Explained that she needs SHGM first and we scheduled that for Friday May 17 3:00pm.  That will be the first day of new pack of OC's and she has also had tubal ligation.  Hopefully, that will assure that she will not be bleeding. She is currently having some BTB because of missed oc's.

## 2011-10-01 NOTE — Telephone Encounter (Signed)
I called patient and left message that I had checked her benefits for ablation at [place]'s request and her insurance covers it at 100%.  I told her to call me because she will need another procedure first before she can do ablation [facility]).  Left message for her to call me.

## 2011-10-11 ENCOUNTER — Ambulatory Visit (INDEPENDENT_AMBULATORY_CARE_PROVIDER_SITE_OTHER): Payer: BC Managed Care – PPO

## 2011-10-11 ENCOUNTER — Ambulatory Visit (INDEPENDENT_AMBULATORY_CARE_PROVIDER_SITE_OTHER): Payer: BC Managed Care – PPO | Admitting: Gynecology

## 2011-10-11 ENCOUNTER — Encounter: Payer: Self-pay | Admitting: Gynecology

## 2011-10-11 ENCOUNTER — Other Ambulatory Visit: Payer: Self-pay | Admitting: Women's Health

## 2011-10-11 DIAGNOSIS — N946 Dysmenorrhea, unspecified: Secondary | ICD-10-CM

## 2011-10-11 DIAGNOSIS — N920 Excessive and frequent menstruation with regular cycle: Secondary | ICD-10-CM

## 2011-10-11 DIAGNOSIS — IMO0002 Reserved for concepts with insufficient information to code with codable children: Secondary | ICD-10-CM

## 2011-10-11 NOTE — Progress Notes (Signed)
Patient presents for sonohysterogram. Recently saw Penny Thomas with a history of accelerating dysmenorrhea now with pelvic cramping in between her periods. Her menses alternate between heavy with every three-hour changes to light her with 3 pad changes during the day.  She is status post BTL. She has a history of vaginal delivery first pregnancy, vaginal delivery first twin second pregnancy with C-section second twin and C-section with her third pregnancy. She also has a history of postpartum transfusion following each pregnancy.  She has no history of easy bruising, bleeding otherwise such as nosebleeds or gum bleeding. No family history of bleeding disorders.  Ultrasound shows endometrial echo 6.8 mm. No myometrial abnormalities. Right left ovaries normal. Cul-de-sac negative. Sonohysterogram performed, sterile technique, easy catheter introduction, good distention with no abnormalities. Endometrial sample taken. Patient tolerated well.  Assessment and plan: Worsening dysmenorrhea now with intermenstrual pain. Has sister with endometriosis. Menses alternate from heavy to light her depending on cycle. Status post BTL. I reviewed the situation with the patient and the options. She is currently on low-dose oral contraceptives for menstrual suppression which seemed to work in the past. She had tried Depo-Provera which did not help with breakthrough bleeding. She tried Mirena IUD in the past but apparently had 3 expulsions. Endometrial ablation options reviewed although given her history to include her young age, dysmenorrhea as her main complaint and family history of endometriosis with strong possibility of adenomyosis and the patient I think the failure risk would be higher than normal she would have to accept this. Ultimately I discussed hysterectomy and I think a laparoscopic associated hysterectomy would be the best choice for her and she wants to think of these options. Patient will follow up for her biopsy  results and we'll go from there.  Given her transfusion history with each postpartum period I ordered a von Willebrand's panel. Her CBC recently showed a normal hemoglobin and normal platelet count.

## 2011-10-11 NOTE — Patient Instructions (Signed)
Office will call you with the biopsy results. We can further discuss options for treatment at that time.

## 2011-10-22 ENCOUNTER — Other Ambulatory Visit: Payer: Self-pay | Admitting: Gynecology

## 2011-10-22 DIAGNOSIS — N920 Excessive and frequent menstruation with regular cycle: Secondary | ICD-10-CM

## 2011-10-23 ENCOUNTER — Other Ambulatory Visit: Payer: Self-pay | Admitting: *Deleted

## 2011-10-23 ENCOUNTER — Telehealth: Payer: Self-pay | Admitting: Gynecology

## 2011-10-23 ENCOUNTER — Other Ambulatory Visit: Payer: Self-pay | Admitting: Gynecology

## 2011-10-23 DIAGNOSIS — N920 Excessive and frequent menstruation with regular cycle: Secondary | ICD-10-CM

## 2011-10-23 NOTE — Telephone Encounter (Signed)
I called patient to schedule Her Option endometrial ablation.  Patient was advised she will need someone to drive her to and from procedure.  She was advised regarding full bladder instructions for morning of procedure.  She is ready to schedule whenever. She is on OC's and Dr. Velvet Bathe recommended she continue until procedure. I explained that procedure cannot be done if she is on her period so if when we scheduled happens to be placebo week she should skip placebos and open new pack and keep taking active pills until procedure date.  We scheduled her for June 19 at 9am.  She will consult with Dr. Velvet Bathe on June 11 10:30am.  She plans to bring her husband to Dr. Velvet Bathe can explain procedure to him and what will be needed of him.  Patient was advised regarding Cytotec and this has been e-scribed to her pharmacy.  Patient was given my direct number and if she has any questions she can call me.

## 2011-10-24 MED ORDER — MISOPROSTOL 200 MCG PO TABS: ORAL_TABLET | ORAL | Status: DC

## 2011-10-26 HISTORY — PX: ENDOMETRIAL ABLATION: SHX621

## 2011-11-05 ENCOUNTER — Encounter: Payer: Self-pay | Admitting: Gynecology

## 2011-11-05 ENCOUNTER — Ambulatory Visit (INDEPENDENT_AMBULATORY_CARE_PROVIDER_SITE_OTHER): Payer: BC Managed Care – PPO | Admitting: Gynecology

## 2011-11-05 ENCOUNTER — Other Ambulatory Visit: Payer: Self-pay | Admitting: Gynecology

## 2011-11-05 DIAGNOSIS — N946 Dysmenorrhea, unspecified: Secondary | ICD-10-CM

## 2011-11-05 DIAGNOSIS — N920 Excessive and frequent menstruation with regular cycle: Secondary | ICD-10-CM

## 2011-11-05 MED ORDER — DIAZEPAM 5 MG PO TABS: ORAL_TABLET | ORAL | Status: DC

## 2011-11-05 MED ORDER — AZITHROMYCIN 500 MG PO TABS: ORAL_TABLET | ORAL | Status: DC

## 2011-11-05 MED ORDER — HYDROCODONE-ACETAMINOPHEN 5-500 MG PO TABS: ORAL_TABLET | ORAL | Status: DC

## 2011-11-05 NOTE — Patient Instructions (Signed)
Endometrial Ablation Endometrial ablation removes the lining of the uterus (endometrium). It is usually a same day, outpatient treatment. Ablation helps avoid major surgery (such as a hysterectomy). A hysterectomy is removal of the cervix and uterus. Endometrial ablation has less risk and complications, has a shorter recovery period and is less expensive. After endometrial ablation, most women will have little or no menstrual bleeding. You may not keep your fertility. Pregnancy is no longer likely after this procedure but if you are pre-menopausal, you still need to use a reliable method of birth control following the procedure because pregnancy can occur. REASONS TO HAVE THE PROCEDURE MAY INCLUDE:  Heavy periods.   Bleeding that is causing anemia.   Anovulatory bleeding, very irregular, bleeding.   Bleeding submucous fibroids (on the lining inside the uterus) if they are smaller than 3 centimeters.  REASONS NOT TO HAVE THE PROCEDURE MAY INCLUDE:  You wish to have more children.   You have a pre-cancerous or cancerous problem. The cause of any abnormal bleeding must be diagnosed before having the procedure.   You have pain coming from the uterus.   You have a submucus fibroid larger than 3 centimeters.   You recently had a baby.   You recently had an infection in the uterus.   You have a severe retro-flexed, tipped uterus and cannot insert the instrument to do the ablation.   You had a Cesarean section or deep major surgery on the uterus.   The inner cavity of the uterus is too large for the endometrial ablation instrument.  RISKS AND COMPLICATIONS   Perforation of the uterus.   Bleeding.   Infection of the uterus, bladder or vagina.   Injury to surrounding organs.   Cutting the cervix.   An air bubble to the lung (air embolus).   Pregnancy following the procedure.   Failure of the procedure to help the problem requiring hysterectomy.   Decreased ability to diagnose  cancer in the lining of the uterus.  BEFORE THE PROCEDURE  The lining of the uterus must be tested to make sure there is no pre-cancerous or cancer cells present.   Medications may be given to make the lining of the uterus thinner.   Ultrasound may be used to evaluate the size and look for abnormalities of the uterus.   Future pregnancy is not desired.  PROCEDURE  There are different ways to destroy the lining of the uterus.   Resectoscope - radio frequency-alternating electric current is the most common one used.   Cryotherapy - freezing the lining of the uterus.   Heated Free Liquid - heated salt (saline) solution inserted into the uterus.   Microwave - uses high energy microwaves in the uterus.   Thermal Balloon - a catheter with a balloon tip is inserted into the uterus and filled with heated fluid.  Your caregiver will talk with you about the method used in this clinic. They will also instruct you on the pros and cons of the procedure. Endometrial ablation is performed along with a procedure called operative hysteroscopy. A narrow viewing tube is inserted through the birth canal (vagina) and through the cervix into the uterus. A tiny camera attached to the viewing tube (hysteroscope) allows the uterine cavity to be shown on a TV monitor during surgery. Your uterus is filled with a harmless liquid to make the procedure easier. The lining of the uterus is then removed. The lining can also be removed with a resectoscope which allows your surgeon   to cut away the lining of the uterus under direct vision. Usually, you will be able to go home within an hour after the procedure. HOME CARE INSTRUCTIONS   Do not drive for 24 hours.   No tampons, douching or intercourse for 2 weeks or until your caregiver approves.   Rest at home for 24 to 48 hours. You may then resume normal activities unless told differently by your caregiver.   Take your temperature two times a day for 4 days, and record  it.   Take any medications your caregiver has ordered, as directed.   Use some form of contraception if you are pre-menopausal and do not want to get pregnant.  Bleeding after the procedure is normal. It varies from light spotting and mildly watery to bloody discharge for 4 to 6 weeks. You may also have mild cramping. Only take over-the-counter or prescription medicines for pain, discomfort, or fever as directed by your caregiver. Do not use aspirin, as this may aggravate bleeding. Frequent urination during the first 24 hours is normal. You will not know how effective your surgery is until at least 3 months after the surgery. SEEK IMMEDIATE MEDICAL CARE IF:   Bleeding is heavier than a normal menstrual cycle.   An oral temperature above 102 F (38.9 C) develops.   You have increasing cramps or pains not relieved with medication or develop belly (abdominal) pain which does not seem to be related to the same area of earlier cramping and pain.   You are light headed, weak or have fainting episodes.   You develop pain in the shoulder strap areas.   You have chest or leg pain.   You have abnormal vaginal discharge.   You have painful urination.  Document Released: 03/22/2004 Document Revised: 05/02/2011 Document Reviewed: 06/20/2007 [employer] Patient Information 2012 [employer]. 

## 2011-11-05 NOTE — Progress Notes (Signed)
Patient presents with her husband for preoperative appointment for upcoming [name] endometrial ablation. I again reviewed with her her situation of menorrhagia dysmenorrhea family history of endometriosis. I reviewed in general what is involved with the procedure to include the intraoperative/postoperative courses. The risks of bleeding, infection, damage to internal organs either through perforation or transuterine cryo- damage to bowel bladder ureters vessels and nerves necessitating major exploratory reparative surgeries as well as the risk of hematometria requiring treatment in the future was reviewed.  She understands that there are no guarantees and that her bleeding may continue worse or change following the procedure. I particularly emphasized the absolute and irreversible sterility following this procedure and that childbearing can never be an issue in the future. She is status post tubal sterilization but given her younger age I wanted her to clearly understand this and she does. I also emphasized given her history of dysmenorrhea and family history of endometriosis and her young age that there is a strong possibility that her pain may persist or worsen following the procedure the ultimately may require hysterectomy and the failure risk of ablation is considered higher in her particular case and she understands this. The patient has read through our consent form and signed this. I reviewed the medications and how to use them in the postoperative instructions were reviewed with her.  Exam of Sherrilyn Rist chaperone present. External BUS vagina normal. Cervix normal. Uterus normal size midline mobile nontender. Adnexa without masses or tenderness.  Assessment and plan: Menorrhagia, dysmenorrhea status post tubal sterilization for [name] endometrial ablation.  The patient and her husband's questions were answered and she is ready to proceed with surgery.

## 2011-11-13 ENCOUNTER — Ambulatory Visit: Payer: BC Managed Care – PPO | Admitting: Gynecology

## 2011-11-13 ENCOUNTER — Encounter: Payer: Self-pay | Admitting: Gynecology

## 2011-11-13 ENCOUNTER — Ambulatory Visit (INDEPENDENT_AMBULATORY_CARE_PROVIDER_SITE_OTHER): Payer: BC Managed Care – PPO

## 2011-11-13 ENCOUNTER — Other Ambulatory Visit: Payer: BC Managed Care – PPO

## 2011-11-13 VITALS — BP 132/78 | HR 80

## 2011-11-13 VITALS — BP 120/74 | HR 72

## 2011-11-13 DIAGNOSIS — R102 Pelvic and perineal pain: Secondary | ICD-10-CM

## 2011-11-13 DIAGNOSIS — N949 Unspecified condition associated with female genital organs and menstrual cycle: Secondary | ICD-10-CM

## 2011-11-13 DIAGNOSIS — N920 Excessive and frequent menstruation with regular cycle: Secondary | ICD-10-CM

## 2011-11-13 MED ORDER — LIDOCAINE HCL 1 % IJ SOLN
10.0000 mL | Freq: Once | INTRAMUSCULAR | Status: AC
  Administered 2011-11-13: 10 mL

## 2011-11-13 MED ORDER — KETOROLAC TROMETHAMINE 30 MG/ML IJ SOLN
60.0000 mg | Freq: Once | INTRAMUSCULAR | Status: AC
  Administered 2011-11-13: 60 mg via INTRAMUSCULAR

## 2011-11-13 NOTE — Patient Instructions (Signed)
Endometrial Ablation Endometrial ablation removes the lining of the uterus (endometrium). It is usually a same day, outpatient treatment. Ablation helps avoid major surgery (such as a hysterectomy). A hysterectomy is removal of the cervix and uterus. Endometrial ablation has less risk and complications, has a shorter recovery period and is less expensive. After endometrial ablation, most women will have little or no menstrual bleeding. You may not keep your fertility. Pregnancy is no longer likely after this procedure but if you are pre-menopausal, you still need to use a reliable method of birth control following the procedure because pregnancy can occur. REASONS TO HAVE THE PROCEDURE MAY INCLUDE:  Heavy periods.   Bleeding that is causing anemia.   Anovulatory bleeding, very irregular, bleeding.   Bleeding submucous fibroids (on the lining inside the uterus) if they are smaller than 3 centimeters.  REASONS NOT TO HAVE THE PROCEDURE MAY INCLUDE:  You wish to have more children.   You have a pre-cancerous or cancerous problem. The cause of any abnormal bleeding must be diagnosed before having the procedure.   You have pain coming from the uterus.   You have a submucus fibroid larger than 3 centimeters.   You recently had a baby.   You recently had an infection in the uterus.   You have a severe retro-flexed, tipped uterus and cannot insert the instrument to do the ablation.   You had a Cesarean section or deep major surgery on the uterus.   The inner cavity of the uterus is too large for the endometrial ablation instrument.  RISKS AND COMPLICATIONS   Perforation of the uterus.   Bleeding.   Infection of the uterus, bladder or vagina.   Injury to surrounding organs.   Cutting the cervix.   An air bubble to the lung (air embolus).   Pregnancy following the procedure.   Failure of the procedure to help the problem requiring hysterectomy.   Decreased ability to diagnose  cancer in the lining of the uterus.  BEFORE THE PROCEDURE  The lining of the uterus must be tested to make sure there is no pre-cancerous or cancer cells present.   Medications may be given to make the lining of the uterus thinner.   Ultrasound may be used to evaluate the size and look for abnormalities of the uterus.   Future pregnancy is not desired.  PROCEDURE  There are different ways to destroy the lining of the uterus.   Resectoscope - radio frequency-alternating electric current is the most common one used.   Cryotherapy - freezing the lining of the uterus.   Heated Free Liquid - heated salt (saline) solution inserted into the uterus.   Microwave - uses high energy microwaves in the uterus.   Thermal Balloon - a catheter with a balloon tip is inserted into the uterus and filled with heated fluid.  Your caregiver will talk with you about the method used in this clinic. They will also instruct you on the pros and cons of the procedure. Endometrial ablation is performed along with a procedure called operative hysteroscopy. A narrow viewing tube is inserted through the birth canal (vagina) and through the cervix into the uterus. A tiny camera attached to the viewing tube (hysteroscope) allows the uterine cavity to be shown on a TV monitor during surgery. Your uterus is filled with a harmless liquid to make the procedure easier. The lining of the uterus is then removed. The lining can also be removed with a resectoscope which allows your surgeon   to cut away the lining of the uterus under direct vision. Usually, you will be able to go home within an hour after the procedure. HOME CARE INSTRUCTIONS   Do not drive for 24 hours.   No tampons, douching or intercourse for 2 weeks or until your caregiver approves.   Rest at home for 24 to 48 hours. You may then resume normal activities unless told differently by your caregiver.   Take your temperature two times a day for 4 days, and record  it.   Take any medications your caregiver has ordered, as directed.   Use some form of contraception if you are pre-menopausal and do not want to get pregnant.  Bleeding after the procedure is normal. It varies from light spotting and mildly watery to bloody discharge for 4 to 6 weeks. You may also have mild cramping. Only take over-the-counter or prescription medicines for pain, discomfort, or fever as directed by your caregiver. Do not use aspirin, as this may aggravate bleeding. Frequent urination during the first 24 hours is normal. You will not know how effective your surgery is until at least 3 months after the surgery. SEEK IMMEDIATE MEDICAL CARE IF:   Bleeding is heavier than a normal menstrual cycle.   An oral temperature above 102 F (38.9 C) develops.   You have increasing cramps or pains not relieved with medication or develop belly (abdominal) pain which does not seem to be related to the same area of earlier cramping and pain.   You are light headed, weak or have fainting episodes.   You develop pain in the shoulder strap areas.   You have chest or leg pain.   You have abnormal vaginal discharge.   You have painful urination.  Document Released: 03/22/2004 Document Revised: 05/02/2011 Document Reviewed: 06/20/2007 [employer] Patient Information 2012 [employer]. 

## 2011-11-13 NOTE — Progress Notes (Signed)
HER OPTION ENDOMETRIAL ABLATION PROCEDURAL NOTE    Penny Thomas 15-Aug-1985 [id number]   11/13/2011  Diagnosis:  Excessive uterine bleeding / Menorrhagia  Procedure:  Endometrial cryoablation with intraoperative ultrasonic guidance  Procedure:  The patient was brought to the treatment room having previously been counseled for the procedure and having signed the consent form that is scanned into Epic. Pre procedural medications received were Toradol 30 mg IM and Lortab 5.0/325 one by mouth.  The patient was placed in the dorsal lithotomy position and a speculum was inserted. The cervix and upper vagina were cleansed with Betadine. A single-tooth tenaculum was placed on the anterior lip of the cervix. A  paracervical block was placed yes using 10 cc's of 1% lidocaine.  The uterus was yes sounded 9 centimeters. Cervical dilatation performed no . Under ultrasound guidance, the Her Option probe was introduced into the uterine cavity after the preprocedural sequence was performed. After assuring proper cornual placement, cryoablation was then performed under continuous ultrasound guidance monitoring the growth of the cryo-zone. Sequential cryoablation's were performed in the following order:   Location   Length of Time Myometrial Depth 1. right cornua   6 minutes  6.4 mm sagittal, 6.4 mm transverse 2. left cornua   5 minutes  4.9 mm sagittal, 4.4 mm transverse    Upon completion of the procedure the instruments were removed, hemostasis visualized the patient was assisted to the bathroom and then to another exam room where she was observed. The patient tolerated the procedure well and was released in stable condition with her driver along with a copy of the postprocedural instructions and precautions which were reviewed with her. She is to return to the office for post procedural check and received an appointment 9 July at 10:30 AM.  Dara Lords MD, 11:30 AM 11/13/2011

## 2011-11-14 ENCOUNTER — Telehealth: Payer: Self-pay | Admitting: Gynecology

## 2011-11-14 ENCOUNTER — Encounter: Payer: Self-pay | Admitting: Gynecology

## 2011-11-14 NOTE — Telephone Encounter (Signed)
By Saturday I think a OTC ibuprofen 200 mg 3 tablets 3-4 times daily with suffice.

## 2011-11-14 NOTE — Telephone Encounter (Signed)
Patient using Hydrocodone for pain since endometrial ablation yesterday.  She said she is returning to work Saturday and wondered if anything non-narcotic you prescribe for pain that she could take and work with.  Also, patient asked for return to work letter to excuse Wednesday and Friday this week as those are the days she works. She will return Saturday. I have typed letter and it will be coming to you for signature. Thx.

## 2011-11-14 NOTE — Telephone Encounter (Signed)
Patient informed. 

## 2011-11-16 ENCOUNTER — Other Ambulatory Visit: Payer: Self-pay | Admitting: Women's Health

## 2011-12-03 ENCOUNTER — Encounter: Payer: Self-pay | Admitting: Gynecology

## 2011-12-03 ENCOUNTER — Ambulatory Visit (INDEPENDENT_AMBULATORY_CARE_PROVIDER_SITE_OTHER): Payer: BC Managed Care – PPO | Admitting: Gynecology

## 2011-12-03 DIAGNOSIS — N949 Unspecified condition associated with female genital organs and menstrual cycle: Secondary | ICD-10-CM

## 2011-12-03 DIAGNOSIS — R102 Pelvic and perineal pain: Secondary | ICD-10-CM

## 2011-12-03 MED ORDER — IBUPROFEN 800 MG PO TABS: 800.0000 mg | ORAL_TABLET | Freq: Three times a day (TID) | ORAL | Status: AC | PRN

## 2011-12-03 NOTE — Progress Notes (Signed)
Patient presents several weeks after her [name] endometrial ablation. Notes cramping almost daily. Light vaginal discharge. No bleeding.    Exam was Sherrilyn Rist Asst. Abdomen: Soft nontender without masses guarding rebound organomegaly. [place]: External BUS vagina with slight discharge. Cervix normal. Uterus normal size midline mobile nontender. Adnexa without masses or tenderness.  Assessment and plan: Mild pelvic cramping. Exam is normal. We'll treat with Motrin 800 mg every 8 hours when necessary pain #30 refill 1. Follow up if symptoms persist/worsened. Keep menstrual calendar. As long as later more acceptable menses and will follow and she'll return in 6 months at her annual exam. Sooner as needed.

## 2011-12-03 NOTE — Patient Instructions (Addendum)
Use ibuprofen 800 mg every 8 hours as needed for cramping. If continues follow up for reevaluation. Keep menstrual calendar. As long as acceptable follow up in 6 months for your annual exam.

## 2011-12-09 ENCOUNTER — Other Ambulatory Visit: Payer: Self-pay | Admitting: Gynecology

## 2011-12-09 MED ORDER — HYDROCODONE-ACETAMINOPHEN 5-500 MG PO TABS: ORAL_TABLET | ORAL | Status: DC

## 2011-12-09 NOTE — Telephone Encounter (Signed)
Called into pharmacy

## 2012-01-15 ENCOUNTER — Telehealth: Payer: Self-pay | Admitting: *Deleted

## 2012-01-15 NOTE — Telephone Encounter (Signed)
Pt calling requesting code for procedure code to untie  fallopian tubes. Code given 587.50. This would be for the pt sister, which pt is trying to have placed on her insurance.

## 2012-01-22 ENCOUNTER — Telehealth: Payer: Self-pay | Admitting: *Deleted

## 2012-01-22 NOTE — Telephone Encounter (Signed)
Pt is calling requesting pain medication for her cycle. Pt cycle will be starting soon and pt said that the tramadol 50 mg  makes her sleepy. Pt had her option ablation done back in June and was giving Lortab 5-500 by TF, pt is requesting refill on that medication. She took half a tablet and this helped with cramps last time. Please advise

## 2012-01-22 NOTE — Telephone Encounter (Signed)
Telephone call, reviewed the addictive properties of Lortab, states uses half tab with cycle only. States cycles are now lasting 5-6 days had been 4 prior to ablation.  Penny Thomas please call in Lortab 5/500  Half to one every 8 hours when necessary #15 no refills .

## 2012-01-23 MED ORDER — HYDROCODONE-ACETAMINOPHEN 5-500 MG PO TABS: 1.0000 | ORAL_TABLET | Freq: Three times a day (TID) | ORAL | Status: AC | PRN

## 2012-01-23 NOTE — Telephone Encounter (Signed)
rx called in

## 2012-03-11 ENCOUNTER — Other Ambulatory Visit: Payer: Self-pay | Admitting: Women's Health

## 2012-03-11 ENCOUNTER — Telehealth: Payer: Self-pay | Admitting: *Deleted

## 2012-03-11 DIAGNOSIS — N946 Dysmenorrhea, unspecified: Secondary | ICD-10-CM

## 2012-03-11 MED ORDER — HYDROCODONE-ACETAMINOPHEN 5-500 MG PO TABS
ORAL_TABLET | ORAL | Status: DC
Start: 2012-03-11 — End: 2012-08-14

## 2012-03-11 NOTE — Telephone Encounter (Signed)
Cramping with stabbing pain, flow less, but still cramping. No relief with tramadol or Motrin. Has used a half a tablet of Vicodin in the past is aware it is addictive and uses sparingly will call in. Changed jobs, currently no insurance will schedule appointment later.

## 2012-03-11 NOTE — Telephone Encounter (Signed)
Pt calling c/o menstrual cramping, pt started her cycle and is currently taking tramadol to help. Pt requesting something stronger than tramadol. Please advise

## 2012-04-13 ENCOUNTER — Other Ambulatory Visit: Payer: Self-pay | Admitting: Women's Health

## 2012-05-27 HISTORY — PX: WISDOM TOOTH EXTRACTION: SHX21

## 2012-06-10 ENCOUNTER — Other Ambulatory Visit: Payer: Self-pay | Admitting: Women's Health

## 2012-06-19 ENCOUNTER — Other Ambulatory Visit: Payer: Self-pay | Admitting: Women's Health

## 2012-06-19 NOTE — Telephone Encounter (Signed)
Called into pharmacy

## 2012-06-19 NOTE — Telephone Encounter (Signed)
Patient is due for CE after 06/26/12 so I will ask appt desk to call her to schedule.

## 2012-08-14 ENCOUNTER — Other Ambulatory Visit: Payer: Self-pay | Admitting: Women's Health

## 2012-08-14 ENCOUNTER — Telehealth: Payer: Self-pay | Admitting: *Deleted

## 2012-08-14 DIAGNOSIS — N946 Dysmenorrhea, unspecified: Secondary | ICD-10-CM

## 2012-08-14 MED ORDER — HYDROCODONE-ACETAMINOPHEN 5-500 MG PO TABS: ORAL_TABLET | ORAL | Status: DC

## 2012-08-14 MED ORDER — HYDROCODONE-ACETAMINOPHEN 5-325 MG PO TABS: ORAL_TABLET | ORAL | Status: DC

## 2012-08-14 NOTE — Telephone Encounter (Signed)
Message left to call

## 2012-08-14 NOTE — Telephone Encounter (Signed)
Pt called requesting stronger rx for menstrual cramps, she has Rx for tramadol, pt states its not stronger enough. Please advise

## 2012-08-14 NOTE — Telephone Encounter (Signed)
Spoke with nancy and correct 5/325 should be hydrocodone. rx called in.

## 2012-08-14 NOTE — Telephone Encounter (Signed)
Telephone call, reviewed tramadol is not helpful, has cramping 3 weeks out of the month more severe with menses. Cycle length is about half of what it had been prior to ablation but continues with dysmenorrhea. Reviewed to schedule appointment with Dr. Audie Box to discuss hysterectomy. States without insurance right now, but once has insurance will schedule. Will call in a prescription for Vicodin, did review importance of using sparingly, constipating and addictive.

## 2012-08-24 ENCOUNTER — Telehealth: Payer: Self-pay | Admitting: *Deleted

## 2012-08-24 NOTE — Telephone Encounter (Signed)
Message left

## 2012-08-24 NOTE — Telephone Encounter (Signed)
Pt called and asked if other options were available regarding her pelvic pain? Pt said she doesn't think she will be able to afford hysterectomy. She asked to speak with you call back # [phone].

## 2012-08-26 NOTE — Telephone Encounter (Signed)
Telephone call, questions if a second ablation would help with cramping. States has had maybe 25% decrease in cramping. Reviewed probably not, currently without insurance. Would like to proceed with hysterectomy after she has insurance coverage due to  dysmenorrhea.

## 2012-10-11 ENCOUNTER — Other Ambulatory Visit: Payer: Self-pay | Admitting: Women's Health

## 2012-10-17 ENCOUNTER — Other Ambulatory Visit: Payer: Self-pay | Admitting: Women's Health

## 2012-10-20 NOTE — Telephone Encounter (Signed)
I will ask appt desk to call patient and schedule her CE as it appears she is overdue.

## 2013-01-09 ENCOUNTER — Other Ambulatory Visit: Payer: Self-pay | Admitting: Gynecology

## 2013-01-15 ENCOUNTER — Telehealth: Payer: Self-pay | Admitting: *Deleted

## 2013-01-15 MED ORDER — TRAMADOL HCL 50 MG PO TABS: ORAL_TABLET | ORAL | Status: DC

## 2013-01-15 NOTE — Telephone Encounter (Signed)
rx sent, pt informed.  

## 2013-01-15 NOTE — Telephone Encounter (Signed)
Ok please call in refill for tramadol

## 2013-01-15 NOTE — Telephone Encounter (Signed)
Pt called requesting Rx for tramadol pt is on cycle and having bad cramps, pt is overdue for annual, she has no health insurance now and unable to come up with $200 right now to pay for visit. Pt asked if you would be willing to fill? Pt said she going to try to get the money to be seen. Please advise

## 2013-01-21 ENCOUNTER — Telehealth: Payer: Self-pay

## 2013-01-21 ENCOUNTER — Other Ambulatory Visit: Payer: Self-pay | Admitting: Women's Health

## 2013-01-21 MED ORDER — TRAMADOL HCL 50 MG PO TABS: ORAL_TABLET | ORAL | Status: DC

## 2013-01-21 NOTE — Telephone Encounter (Signed)
Patient states she called last week for Tramadol refill and still it is not at pharmacy.  I noticed in chart that it was sent to print instead of to pharmacy.  I reordered it as phone in so it will go to pharmacy. Patient informed.

## 2013-01-28 ENCOUNTER — Telehealth: Payer: Self-pay | Admitting: *Deleted

## 2013-01-28 NOTE — Telephone Encounter (Signed)
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 She has never been a Nurse, adult. Her annual is schedule for 02/17/13. Her cycle is a couple of days late, no chance of pregnancy. She asked if you have any recommendations. Has no insurance now waiting for her card to come. Please advise

## 2013-01-28 NOTE — Telephone Encounter (Signed)
Message left

## 2013-01-28 NOTE — Telephone Encounter (Signed)
Telephone call, states cycles are now between one and 3 days and much better. Questions about increased perspiration. Will check a TSH at annual exam.

## 2013-02-17 ENCOUNTER — Encounter: Payer: Self-pay | Admitting: Women's Health

## 2013-03-04 ENCOUNTER — Telehealth: Payer: Self-pay | Admitting: *Deleted

## 2013-03-04 NOTE — Telephone Encounter (Signed)
Pt called has Rx for ultram and Vicodin 5/325 to take for cramps during cycle. Pt took ultram this am it took the edge off, but still having bad cramps, pt asked if anything OTC she could take with ultram? Maybe muscle relaxer? She takes Vicodin when no working, because it makes her sleepy. Pt still without insurance. Please advise

## 2013-03-04 NOTE — Telephone Encounter (Signed)
Pt informed with below note.  

## 2013-03-04 NOTE — Telephone Encounter (Signed)
Could try Aleve or Motrin in between taking Ultram.

## 2013-03-31 ENCOUNTER — Telehealth: Payer: Self-pay | Admitting: *Deleted

## 2013-03-31 MED ORDER — TRAMADOL HCL 50 MG PO TABS: ORAL_TABLET | ORAL | Status: DC

## 2013-03-31 NOTE — Telephone Encounter (Signed)
#   30 pills or #40 pt received #40 on last amount take 2 tablet by mouth every 6 hours for pain back on 01/21/13. Please advise

## 2013-03-31 NOTE — Telephone Encounter (Signed)
Ok for #40. thanks

## 2013-03-31 NOTE — Telephone Encounter (Signed)
Left on voicemail ultram called in and the Vicodin unable to call in.

## 2013-03-31 NOTE — Telephone Encounter (Signed)
Pt has annual schedule on 04/08/13, requesting ultram and Vicodin 5/325, pt cycle will be starting soon and is out of medications. Please advise

## 2013-03-31 NOTE — Telephone Encounter (Signed)
Review Vicodin is now a scheduled 2 drug unable to call in. Please refill  UlTRAM.

## 2013-04-05 ENCOUNTER — Telehealth: Payer: Self-pay | Admitting: *Deleted

## 2013-04-05 NOTE — Telephone Encounter (Signed)
Pt called stating never received her ultram 50 mg rx on Friday which I did call in. I called back at [facility] today to call rx in again. I informed pt with this and she will pick up rx.

## 2013-04-08 ENCOUNTER — Ambulatory Visit (INDEPENDENT_AMBULATORY_CARE_PROVIDER_SITE_OTHER): Payer: BC Managed Care – PPO | Admitting: Women's Health

## 2013-04-08 ENCOUNTER — Encounter: Payer: Self-pay | Admitting: Women's Health

## 2013-04-08 ENCOUNTER — Other Ambulatory Visit (HOSPITAL_COMMUNITY)
Admission: RE | Admit: 2013-04-08 | Discharge: 2013-04-08 | Disposition: A | Discharge status: Home / Self Care | Payer: BC Managed Care – PPO | Source: Ambulatory Visit | Attending: Gynecology | Admitting: Gynecology

## 2013-04-08 VITALS — BP 116/76 | Ht 67.0 in | Wt 165.8 lb

## 2013-04-08 DIAGNOSIS — B9689 Other specified bacterial agents as the cause of diseases classified elsewhere: Secondary | ICD-10-CM

## 2013-04-08 DIAGNOSIS — N809 Endometriosis, unspecified: Secondary | ICD-10-CM | POA: Insufficient documentation

## 2013-04-08 DIAGNOSIS — N760 Acute vaginitis: Secondary | ICD-10-CM

## 2013-04-08 DIAGNOSIS — Z01419 Encounter for gynecological examination (general) (routine) without abnormal findings: Secondary | ICD-10-CM | POA: Insufficient documentation

## 2013-04-08 DIAGNOSIS — B373 Candidiasis of vulva and vagina: Secondary | ICD-10-CM

## 2013-04-08 DIAGNOSIS — N898 Other specified noninflammatory disorders of vagina: Secondary | ICD-10-CM

## 2013-04-08 DIAGNOSIS — N946 Dysmenorrhea, unspecified: Secondary | ICD-10-CM

## 2013-04-08 DIAGNOSIS — A499 Bacterial infection, unspecified: Secondary | ICD-10-CM

## 2013-04-08 DIAGNOSIS — B3731 Acute candidiasis of vulva and vagina: Secondary | ICD-10-CM

## 2013-04-08 DIAGNOSIS — Z833 Family history of diabetes mellitus: Secondary | ICD-10-CM

## 2013-04-08 HISTORY — DX: Endometriosis, unspecified: N80.9

## 2013-04-08 HISTORY — DX: Dysmenorrhea, unspecified: N94.6

## 2013-04-08 LAB — CBC WITH DIFFERENTIAL/PLATELET
Hemoglobin: 12.8 g/dL (ref 12.0–15.0)
Lymphocytes Relative: 38 % (ref 12–46)
Lymphs Abs: 1.9 10*3/uL (ref 0.7–4.0)
MCH: 29 pg (ref 26.0–34.0)
Monocytes Relative: 8 % (ref 3–12)
Neutro Abs: 2.7 10*3/uL (ref 1.7–7.7)
Neutrophils Relative %: 53 % (ref 43–77)
RBC: 4.41 MIL/uL (ref 3.87–5.11)
WBC: 5.1 10*3/uL (ref 4.0–10.5)

## 2013-04-08 LAB — WET PREP FOR TRICH, YEAST, CLUE: Yeast Wet Prep HPF POC: NONE SEEN

## 2013-04-08 MED ORDER — METRONIDAZOLE 0.75 % VA GEL: VAGINAL | Status: DC

## 2013-04-08 MED ORDER — FLUCONAZOLE 150 MG PO TABS: 150.0000 mg | ORAL_TABLET | Freq: Once | ORAL | Status: DC

## 2013-04-08 MED ORDER — TRAMADOL HCL 50 MG PO TABS: ORAL_TABLET | ORAL | Status: DC

## 2013-04-08 MED ORDER — HYDROCODONE-ACETAMINOPHEN 5-325 MG PO TABS: ORAL_TABLET | ORAL | Status: DC

## 2013-04-08 NOTE — Patient Instructions (Signed)

## 2013-04-08 NOTE — Progress Notes (Signed)
Penny Thomas 1986/01/10 [id number]    History:    The patient presents for annual exam.  Light monthly cycle/BTL/ HER option endometrial ablation 10/2011/Dr. Fontaine. History of debilitating dysmenorrhea/endometriosis with cycles every 21 days for 7 days. Reports pain reduction but continues to need pain medication ultram during the day and hydrocodone at bedtime. 2005 cryo- with normal Paps after. Gestational diabetes with one pregnancy. Recently had wisdom teeth removal, antibiotic given.  Past medical history, past surgical history, family history and social history were all reviewed and documented in the EPIC chart. Works 2 jobs, sons 8, twins 3, and a 67-year-old. Sister endometriosis.   ROS:  A  ROS was performed and pertinent positives and negatives are included in the history.  Exam:  Filed Vitals:   04/08/13 0858  BP: 116/76    General appearance:  Normal Head/Neck:  Normal, without cervical or supraclavicular adenopathy. Thyroid:  Symmetrical, normal in size, without palpable masses or nodularity. Respiratory  Effort:  Normal  Auscultation:  Clear without wheezing or rhonchi Cardiovascular  Auscultation:  Regular rate, without rubs, murmurs or gallops  Edema/varicosities:  Not grossly evident Abdominal  Soft,nontender, without masses, guarding or rebound.  Liver/spleen:  No organomegaly noted  Hernia:  None appreciated  Skin  Inspection:  Grossly normal  Palpation:  Grossly normal Neurologic/psychiatric  Orientation:  Normal with appropriate conversation.  Mood/affect:  Normal  Genitourinary    Breasts: Examined lying and sitting.     Right: Without masses, retractions, discharge or axillary adenopathy.     Left: Without masses, retractions, discharge or axillary adenopathy.   Inguinal/mons:  Normal without inguinal adenopathy  External genitalia:  Normal  BUS/Urethra/Skene's glands:  Normal  Bladder:  Normal  Vagina:  Normal minimal discharge, wet prep  positive for rare clues and many bacteria the  Cervix:  Normal  Uterus:  normal in size, shape and contour.  Midline and mobile  Adnexa/parametria:     Rt: Without masses or tenderness.   Lt: Without masses or tenderness.  Anus and perineum: Normal  Digital rectal exam: Normal sphincter tone without palpated masses or tenderness  Assessment/Plan:  27 y.o. MBF G3P4 for annual exam with complaint of discharge.  Endometrial ablation HER option - 10/2011 Dysmenorrhea/endometriosis Bacterial Vaginosis  Plan: MetroGel vaginal cream 1 applicator at bedtime x5, alcohol precautions reviewed, Diflucan 150 by mouth times one dose. Ultram and hydrocodone prescription, proper use, addictive properties reviewed. Aware to use both sparingly. SBE's, exercise, calcium rich diet, decrease calories for weight loss, MVI daily encouraged. CBC, glucose, UA, Pap. New screening guidelines reviewed.    Harrington Challenger [facility], 9:52 AM 04/08/2013

## 2013-04-12 ENCOUNTER — Encounter: Payer: Self-pay | Admitting: Women's Health

## 2013-04-12 NOTE — Telephone Encounter (Signed)
[place] off today.  Dr. Velvet Bathe is backup.

## 2013-04-14 ENCOUNTER — Encounter: Payer: Self-pay | Admitting: Women's Health

## 2013-04-30 ENCOUNTER — Encounter: Payer: Self-pay | Admitting: Women's Health

## 2013-05-05 ENCOUNTER — Other Ambulatory Visit: Payer: Self-pay | Admitting: Women's Health

## 2013-05-05 ENCOUNTER — Telehealth: Payer: Self-pay

## 2013-05-05 DIAGNOSIS — N946 Dysmenorrhea, unspecified: Secondary | ICD-10-CM

## 2013-05-05 MED ORDER — HYDROCODONE-ACETAMINOPHEN 5-325 MG PO TABS: 1.0000 | ORAL_TABLET | Freq: Three times a day (TID) | ORAL | Status: DC

## 2013-05-05 NOTE — Telephone Encounter (Signed)
Okay, prescription written. Review addictive, one prescription should last at least 2 months. Last prescription was given in November.

## 2013-05-05 NOTE — Telephone Encounter (Signed)
I responded to patient's My CHart email and reminded her of the addictive properties and informed her [place] wanted this Rx to last her two mos. I let her know written Rx only now and it will be at front desk for her to pick up.

## 2013-05-05 NOTE — Telephone Encounter (Signed)
Patient emailed through My Chart asking for refill. See below. Please NOTE 0this can no longer be called in or e-scribed but requires written Rx.  Original authorizing provider: Harrington Challenger, NP Penny Thomas would like a refill of the following medications: HYDROcodone-acetaminophen (NORCO/VICODIN) 5-325 MG per tablet Harrington Challenger, NP] Preferred pharmacy: [id number] - [place] ([place]), [place] - [address]   Comment: My cycle came on twice and i just came off my cycle but the cramping is real bad at nigjt

## 2013-05-06 ENCOUNTER — Telehealth: Payer: Self-pay | Admitting: *Deleted

## 2013-05-06 MED ORDER — HYDROCODONE-ACETAMINOPHEN 5-300 MG PO TABS: ORAL_TABLET | ORAL | Status: DC

## 2013-05-06 NOTE — Telephone Encounter (Signed)
Pharmacy called stating the Rx for Vicodin 5/325 is not avaible in that strength, but they do have 5/300. Okay switch? Please advise

## 2013-05-06 NOTE — Telephone Encounter (Signed)
Pharmacist informed with the below, rx called in.

## 2013-05-06 NOTE — Telephone Encounter (Signed)
Yes, sorry

## 2013-05-21 ENCOUNTER — Encounter: Payer: Self-pay | Admitting: Women's Health

## 2013-05-28 ENCOUNTER — Encounter: Payer: Self-pay | Admitting: Women's Health

## 2013-06-28 ENCOUNTER — Emergency Department (HOSPITAL_COMMUNITY)
Admission: EM | Admit: 2013-06-28 | Discharge: 2013-06-28 | Disposition: A | Discharge status: Home / Self Care | Payer: Self-pay | Source: Home / Self Care | Attending: Family Medicine | Admitting: Family Medicine

## 2013-06-28 ENCOUNTER — Encounter (HOSPITAL_COMMUNITY): Payer: Self-pay | Admitting: Emergency Medicine

## 2013-06-28 DIAGNOSIS — J069 Acute upper respiratory infection, unspecified: Secondary | ICD-10-CM

## 2013-06-28 MED ORDER — ALBUTEROL SULFATE HFA 108 (90 BASE) MCG/ACT IN AERS: 1.0000 | INHALATION_SPRAY | Freq: Four times a day (QID) | RESPIRATORY_TRACT | Status: DC | PRN

## 2013-06-28 MED ORDER — BENZONATATE 100 MG PO CAPS: 100.0000 mg | ORAL_CAPSULE | Freq: Three times a day (TID) | ORAL | Status: DC | PRN

## 2013-06-28 NOTE — ED Provider Notes (Signed)
CSN: [id number]     Arrival date & time 06/28/13  1130 History   First MD Initiated Contact with Patient 06/28/13 1156     Chief Complaint  Patient presents with  . Cough   (Consider location/radiation/quality/duration/timing/severity/associated sxs/prior Treatment) HPI Comments: Patient reports that she developed chills, malaise, myalgias on 06/24/2013 along with sore throat. States sore throat and fever have since resolved but now over past 24-36 hours she has developed a cough. She states since cough began she has had to occasionally use her albuterol inhaler for wheezing. Denies chest pain or hemoptysis.   Patient is a 28 y.o. female presenting with cough. The history is provided by the patient.  Cough   Past Medical History  Diagnosis Date  . Asthma   . Migraines   . Hemorrhage in uterus 01-20-09  . Sickle cell trait   . Blood transfusion 2007  . Blood transfusion 2010  . Seasonal allergies    Past Surgical History  Procedure Laterality Date  . Cesarean section  09-28-05  . Cesarean section  01-20-09  . Polyp removal  5-08    ?CERVICAL OR UTERINE POLYP  . Tubal ligation  01-20-09    DONE WITH C-SECTION  . Endometrial ablation  10/2011    HEROPTIOIN  . Wisdom tooth extraction Right 2014   Family History  Problem Relation Age of Onset  . Endometriosis Sister    History  Substance Use Topics  . Smoking status: Never Smoker   . Smokeless tobacco: Never Used  . Alcohol Use: Yes     Comment: seldom   OB History   Grav Para Term Preterm Abortions TAB SAB Ect Mult Living   3 3       1 4      Review of Systems  Respiratory: Positive for cough.   All other systems reviewed and are negative.    Allergies  Review of patient's allergies indicates no known allergies.  Home Medications   Current Outpatient Rx  Name  Route  Sig  Dispense  Refill  . Cyanocobalamin (B-12 PO)   Oral   Take by mouth.         . IRON PO   Oral   Take by mouth.         . ALBUTEROL  IN   Inhalation   Inhale into the lungs.         . fluconazole (DIFLUCAN) 150 MG tablet   Oral   Take 1 tablet (150 mg total) by mouth once.   1 tablet   1   . Hydrocodone-Acetaminophen (VICODIN) 5-300 MG TABS      Take 1 tablet by mouth every 8 hours   30 each   0   . ibuprofen (ADVIL,MOTRIN) 600 MG tablet   Oral   Take 1 tablet (600 mg total) by mouth every 6 (six) hours as needed.   60 tablet   1   . metroNIDAZOLE (METROGEL VAGINAL) 0.75 % vaginal gel      1 applicator per vagina at HS x 5   70 g   0   . montelukast (SINGULAIR) 10 MG tablet   Oral   Take 10 mg by mouth at bedtime.         [facility] PRESCRIPTION MEDICATION      MIGRAINE RX-IFONETHEPT         . traMADol (ULTRAM) 50 MG tablet      TAKE TWO TABLETS BY MOUTH EVERY 6 HOURS AS NEEDED FOR PAIN  40 tablet   1    BP 133/90  Pulse 108  Temp(Src) 98.9 F (37.2 C) (Oral)  Resp 18  SpO2 99%  LMP 06/23/2013 Physical Exam  Nursing note and vitals reviewed. Constitutional: She is oriented to person, place, and time. She appears well-developed and well-nourished. No distress.  HENT:  Head: Normocephalic and atraumatic.  Right Ear: Hearing, tympanic membrane, external ear and ear canal normal.  Left Ear: Hearing, tympanic membrane, external ear and ear canal normal.  Nose: Nose normal.  Mouth/Throat: Uvula is midline, oropharynx is clear and moist and mucous membranes are normal.  Eyes: Conjunctivae are normal. Right eye exhibits no discharge. Left eye exhibits no discharge. No scleral icterus.  Neck: Neck supple.  Cardiovascular: Normal rate, regular rhythm and normal heart sounds.   Pulmonary/Chest: Effort normal and breath sounds normal. She has no wheezes.  Abdominal: Soft. Bowel sounds are normal. She exhibits no distension. There is no tenderness.  Musculoskeletal: Normal range of motion.  Lymphadenopathy:    She has no cervical adenopathy.  Neurological: She is alert and oriented to person,  place, and time.  Skin: Skin is warm and dry.  Psychiatric: She has a normal mood and affect. Her behavior is normal.    ED Course  Procedures (including critical care time) Labs Review Labs Reviewed - No data to display Imaging Review No results found.    MDM  Resolving URI. Will provide additional Rx for albuterol and tessalon for cough. Tylenol as directed on packaging for aches/malaise. Fluids and rest. PCP follow up if no improvement in 7-10 days. Advise patient that she should expect 10-14 days of cough.   [place], [place] 06/28/13 1226

## 2013-06-28 NOTE — ED Notes (Signed)
Non-productive cough, reports a little wheezing last night, not helped by inhaler. Unknown if fever.  Reports chest pain in center cest and has sharp pain with cough, lingering pain

## 2013-07-01 NOTE — ED Provider Notes (Signed)
Medical screening examination/treatment/procedure(s) were performed by a resident physician or non-physician practitioner and as the supervising physician I was immediately available for consultation/collaboration.  Airianna Kreischer, MD    Jaquel Glassburn S Lafreda Casebeer, MD 07/01/13 0744 

## 2013-08-23 ENCOUNTER — Telehealth: Payer: Self-pay | Admitting: *Deleted

## 2013-08-23 DIAGNOSIS — N946 Dysmenorrhea, unspecified: Secondary | ICD-10-CM

## 2013-08-23 MED ORDER — HYDROCODONE-ACETAMINOPHEN 5-300 MG PO TABS: ORAL_TABLET | ORAL | Status: DC

## 2013-08-23 MED ORDER — TRAMADOL HCL 50 MG PO TABS: ORAL_TABLET | ORAL | Status: DC

## 2013-08-23 NOTE — Telephone Encounter (Signed)
RX FOR ULTRAM CALLED IN, WILL WAIT FOR HYDROCODONE TO BE SIGNED.

## 2013-08-23 NOTE — Telephone Encounter (Signed)
Ok to refill both. Review importance of using sparingly, bad for the liver and addictive.  Thanks, #30 for each with no refills.

## 2013-08-23 NOTE — Telephone Encounter (Signed)
(  pt aware you are out of the office) Pt calling requesting refill on Ultram and hydrocodone 5/300. Please advise

## 2013-11-02 ENCOUNTER — Emergency Department (INDEPENDENT_AMBULATORY_CARE_PROVIDER_SITE_OTHER)
Admission: EM | Admit: 2013-11-02 | Discharge: 2013-11-02 | Disposition: A | Discharge status: Home / Self Care | Payer: Self-pay | Source: Home / Self Care

## 2013-11-02 ENCOUNTER — Encounter (HOSPITAL_COMMUNITY): Payer: Self-pay | Admitting: Emergency Medicine

## 2013-11-02 DIAGNOSIS — J45909 Unspecified asthma, uncomplicated: Secondary | ICD-10-CM

## 2013-11-02 DIAGNOSIS — J309 Allergic rhinitis, unspecified: Secondary | ICD-10-CM

## 2013-11-02 MED ORDER — METHYLPREDNISOLONE 4 MG PO KIT: PACK | ORAL | Status: DC

## 2013-11-02 NOTE — ED Provider Notes (Signed)
CSN: [id number]     Arrival date & time 11/02/13  1506 History   First MD Initiated Contact with Patient 11/02/13 1526     Chief Complaint  Patient presents with  . URI   (Consider location/radiation/quality/duration/timing/severity/associated sxs/prior Treatment) HPI Comments: St having to use albuterol HFA more often due to sh of breath. Hx asthma  Patient is a 28 y.o. female presenting with URI. The history is provided by the patient.  URI Presenting symptoms: congestion and cough   Presenting symptoms: no ear pain, no facial pain, no fatigue, no fever and no rhinorrhea   Severity:  Moderate Onset quality:  Gradual Duration:  4 days Timing:  Constant Progression:  Worsening Chronicity:  New Relieved by:  None tried Ineffective treatments:  None tried Associated symptoms: wheezing   Associated symptoms: no arthralgias, no headaches, no myalgias, no neck pain, no sinus pain, no sneezing and no swollen glands   Risk factors: not elderly, no chronic cardiac disease, no chronic respiratory disease, no recent illness and no recent travel     Past Medical History  Diagnosis Date  . Asthma   . Migraines   . Hemorrhage in uterus 01-20-09  . Sickle cell trait   . Blood transfusion 2007  . Blood transfusion 2010  . Seasonal allergies    Past Surgical History  Procedure Laterality Date  . Cesarean section  09-28-05  . Cesarean section  01-20-09  . Polyp removal  5-08    ?CERVICAL OR UTERINE POLYP  . Tubal ligation  01-20-09    DONE WITH C-SECTION  . Endometrial ablation  10/2011    HEROPTIOIN  . Wisdom tooth extraction Right 2014   Family History  Problem Relation Age of Onset  . Endometriosis Sister    History  Substance Use Topics  . Smoking status: Never Smoker   . Smokeless tobacco: Never Used  . Alcohol Use: Yes     Comment: seldom   OB History   Grav Para Term Preterm Abortions TAB SAB Ect Mult Living   3 3       1 4      Review of Systems  Constitutional:  Negative for fever, appetite change and fatigue.  HENT: Positive for congestion. Negative for ear pain, rhinorrhea and sneezing.   Respiratory: Positive for cough and wheezing.   Musculoskeletal: Negative for arthralgias, myalgias and neck pain.  Neurological: Negative for headaches.    Allergies  Review of patient's allergies indicates no known allergies.  Home Medications   Prior to Admission medications   Medication Sig Start Date End Date Taking? Authorizing Provider  albuterol (PROVENTIL HFA;VENTOLIN HFA) 108 (90 BASE) MCG/ACT inhaler Inhale 1-2 puffs into the lungs every 6 (six) hours as needed for wheezing or shortness of breath. 06/28/13   Lahoma Rocker, PA  ALBUTEROL IN Inhale into the lungs.    Historical Provider, MD  benzonatate (TESSALON) 100 MG capsule Take 1 capsule (100 mg total) by mouth 3 (three) times daily as needed for cough. 06/28/13   Lahoma Rocker, PA  Cyanocobalamin (B-12 PO) Take by mouth.    Historical Provider, MD  fluconazole (DIFLUCAN) 150 MG tablet Take 1 tablet (150 mg total) by mouth once. 04/08/13   Huel Cote, NP  Hydrocodone-Acetaminophen (VICODIN) 5-300 MG TABS Take 1 tablet by mouth every 8 hours 08/23/13   Huel Cote, NP  ibuprofen (ADVIL,MOTRIN) 600 MG tablet Take 1 tablet (600 mg total) by mouth every 6 (six) hours as needed. 06/27/11  Huel Cote, NP  IRON PO Take by mouth.    Historical Provider, MD  methylPREDNISolone (MEDROL DOSEPAK) 4 MG tablet follow package directions 11/02/13   Janne Napoleon, NP  metroNIDAZOLE (METROGEL VAGINAL) 0.75 % vaginal gel 1 applicator per vagina at HS x 5 04/08/13   Huel Cote, NP  montelukast (SINGULAIR) 10 MG tablet Take 10 mg by mouth at bedtime.    Historical Provider, MD  [place] RX-IFONETHEPT    Historical Provider, MD  traMADol (ULTRAM) 50 MG tablet TAKE ONE- TWO TABLETS BY MOUTH EVERY 6 HOURS AS NEEDED FOR PAIN 08/23/13   Huel Cote, NP   BP 131/89  Pulse 95   Temp(Src) 98.4 F (36.9 C) (Oral)  Resp 18  SpO2 100%  LMP 11/01/2013 Physical Exam  Nursing note and vitals reviewed. Constitutional: She is oriented to person, place, and time. She appears well-developed and well-nourished. No distress.  HENT:  Mouth/Throat: No oropharyngeal exudate.  bilat TM's nl OP with erythema, cobblestoning and clear PND  Eyes: Conjunctivae and EOM are normal.  Neck: Normal range of motion. Neck supple.  Cardiovascular: Normal rate, regular rhythm and normal heart sounds.   Pulmonary/Chest: Effort normal and breath sounds normal. No respiratory distress. She has no wheezes. She has no rales.  Musculoskeletal: She exhibits no edema and no tenderness.  Neurological: She is alert and oriented to person, place, and time. She exhibits normal muscle tone.  Skin: Skin is warm and dry.  Psychiatric: She has a normal mood and affect.    ED Course  Procedures (including critical care time) Labs Review Labs Reviewed - No data to display  Imaging Review No results found.   MDM   1. Allergic rhinosinusitis   2. Asthma    Allegra or claritin Flonase, saline nasal sprays Lots of fluids Sudafed Pe prn Medrol dose pack.    Janne Napoleon, NP 11/02/13 1551

## 2013-11-02 NOTE — Discharge Instructions (Signed)
Allergic Rhinitis Allegra or claritin Sudafed PE for nasal congestion flonase nasal spray Saline nasal spray Allergic rhinitis is when the mucous membranes in the nose respond to allergens. Allergens are particles in the air that cause your body to have an allergic reaction. This causes you to release allergic antibodies. Through a chain of events, these eventually cause you to release histamine into the blood stream. Although meant to protect the body, it is this release of histamine that causes your discomfort, such as frequent sneezing, congestion, and an itchy, runny nose.  CAUSES  Seasonal allergic rhinitis (hay fever) is caused by pollen allergens that may come from grasses, trees, and weeds. Year-round allergic rhinitis (perennial allergic rhinitis) is caused by allergens such as house dust mites, pet dander, and mold spores.  SYMPTOMS   Nasal stuffiness (congestion).  Itchy, runny nose with sneezing and tearing of the eyes. DIAGNOSIS  Your health care provider can help you determine the allergen or allergens that trigger your symptoms. If you and your health care provider are unable to determine the allergen, skin or blood testing may be used. TREATMENT  Allergic Rhinitis does not have a cure, but it can be controlled by:  Medicines and allergy shots (immunotherapy).  Avoiding the allergen. Hay fever may often be treated with antihistamines in pill or nasal spray forms. Antihistamines block the effects of histamine. There are over-the-counter medicines that may help with nasal congestion and swelling around the eyes. Check with your health care provider before taking or giving this medicine.  If avoiding the allergen or the medicine prescribed do not work, there are many new medicines your health care provider can prescribe. Stronger medicine may be used if initial measures are ineffective. Desensitizing injections can be used if medicine and avoidance does not work. Desensitization is  when a patient is given ongoing shots until the body becomes less sensitive to the allergen. Make sure you follow up with your health care provider if problems continue. HOME CARE INSTRUCTIONS It is not possible to completely avoid allergens, but you can reduce your symptoms by taking steps to limit your exposure to them. It helps to know exactly what you are allergic to so that you can avoid your specific triggers. SEEK MEDICAL CARE IF:   You have a fever.  You develop a cough that does not stop easily (persistent).  You have shortness of breath.  You start wheezing.  Symptoms interfere with normal daily activities. Document Released: 02/05/2001 Document Revised: 03/03/2013 Document Reviewed: 01/18/2013 [facility] Patient Information 2014 [place].  Asthma, Adult Asthma is a recurring condition in which the airways tighten and narrow. Asthma can make it difficult to breathe. It can cause coughing, wheezing, and shortness of breath. Asthma episodes (also called asthma attacks) range from minor to life-threatening. Asthma cannot be cured, but medicines and lifestyle changes can help control it. CAUSES Asthma is believed to be caused by inherited (genetic) and environmental factors, but its exact cause is unknown. Asthma may be triggered by allergens, lung infections, or irritants in the air. Asthma triggers are different for each person. Common triggers include:   Animal dander.  Dust mites.  Cockroaches.  Pollen from trees or grass.  Mold.  Smoke.  Air pollutants such as dust, household cleaners, hair sprays, aerosol sprays, paint fumes, strong chemicals, or strong odors.  Cold air, weather changes, and winds (which increase molds and pollens in the air).  Strong emotional expressions such as crying or laughing hard.  Stress.  Certain  medicines (such as aspirin) or types of drugs (such as beta-blockers).  Sulfites in foods and drinks. Foods and drinks that may contain  sulfites include dried fruit, potato chips, and sparkling grape juice.  Infections or inflammatory conditions such as the flu, a cold, or an inflammation of the nasal membranes (rhinitis).  Gastroesophageal reflux disease (GERD).  Exercise or strenuous activity. SYMPTOMS Symptoms may occur immediately after asthma is triggered or many hours later. Symptoms include:  Wheezing.  Excessive nighttime or early morning coughing.  Frequent or severe coughing with a common cold.  Chest tightness.  Shortness of breath. DIAGNOSIS  The diagnosis of asthma is made by a review of your medical history and a physical exam. Tests may also be performed. These may include:  Lung function studies. These tests show how much air you breath in and out.  Allergy tests.  Imaging tests such as X-rays. TREATMENT  Asthma cannot be cured, but it can usually be controlled. Treatment involves identifying and avoiding your asthma triggers. It also involves medicines. There are 2 classes of medicine used for asthma treatment:   Controller medicines. These prevent asthma symptoms from occurring. They are usually taken every day.  Reliever or rescue medicines. These quickly relieve asthma symptoms. They are used as needed and provide short-term relief. Your health care provider will help you create an asthma action plan. An asthma action plan is a written plan for managing and treating your asthma attacks. It includes a list of your asthma triggers and how they may be avoided. It also includes information on when medicines should be taken and when their dosage should be changed. An action plan may also involve the use of a device called a peak flow meter. A peak flow meter measures how well the lungs are working. It helps you monitor your condition. HOME CARE INSTRUCTIONS   Take medicine as directed by your health care provider. Speak with your health care provider if you have questions about how or when to take  the medicines.  Use a peak flow meter as directed by your health care provider. Record and keep track of readings.  Understand and use the action plan to help minimize or stop an asthma attack without needing to seek medical care.  Control your home environment in the following ways to help prevent asthma attacks:  Do not smoke. Avoid being exposed to secondhand smoke.  Change your heating and air conditioning filter regularly.  Limit your use of fireplaces and wood stoves.  Get rid of pests (such as roaches and mice) and their droppings.  Throw away plants if you see mold on them.  Clean your floors and dust regularly. Use unscented cleaning products.  Try to have someone else vacuum for you regularly. Stay out of rooms while they are being vacuumed and for a short while afterward. If you vacuum, use a dust mask from a hardware store, a double-layered or microfilter vacuum cleaner bag, or a vacuum cleaner with a HEPA filter.  Replace carpet with wood, tile, or vinyl flooring. Carpet can trap dander and dust.  Use allergy-proof pillows, mattress covers, and box spring covers.  Wash bed sheets and blankets every week in hot water and dry them in a dryer.  Use blankets that are made of polyester or cotton.  Clean bathrooms and kitchens with bleach. If possible, have someone repaint the walls in these rooms with mold-resistant paint. Keep out of the rooms that are being cleaned and painted.  Wash hands  frequently. SEEK MEDICAL CARE IF:   You have wheezing, shortness of breath, or a cough even if taking medicine to prevent attacks.  The colored mucus you cough up (sputum) is thicker than usual.  Your sputum changes from clear or white to yellow, green, gray, or bloody.  You have any problems that may be related to the medicines you are taking (such as a rash, itching, swelling, or trouble breathing).  You are using a reliever medicine more than 2 3 times per week.  Your peak  flow is still at 50 79% of you personal best after following your action plan for 1 hour. SEEK IMMEDIATE MEDICAL CARE IF:   You seem to be getting worse and are unresponsive to treatment during an asthma attack.  You are short of breath even at rest.  You get short of breath when doing very little physical activity.  You have difficulty eating, drinking, or talking due to asthma symptoms.  You develop chest pain.  You develop a fast heartbeat.  You have a bluish color to your lips or fingernails.  You are lightheaded, dizzy, or faint.  Your peak flow is less than 50% of your personal best.  You have a fever or persistent symptoms for more than 2 3 days.  You have a fever and symptoms suddenly get worse. MAKE SURE YOU:   Understand these instructions.  Will watch your condition.  Will get help right away if you are not doing well or get worse. Document Released: 05/13/2005 Document Revised: 01/13/2013 Document Reviewed: 12/10/2012 [facility] Patient Information 2014 [place], [place].

## 2013-11-02 NOTE — ED Provider Notes (Signed)
Medical screening examination/treatment/procedure(s) were performed by non-physician practitioner and as supervising physician I was immediately available for consultation/collaboration.  Philipp Deputy, M.D.  Harden Mo, MD 11/02/13 2229

## 2013-11-02 NOTE — ED Notes (Signed)
Pt  Reports  Symptoms  Of  Cough  /  Congested  And  Some    Runny /  Stuffy  Nose           For  About  4  Days     Pt  Reports  Some  Shortness  Of  Breath on  Exertion      Requiring  Her to  Use  Her  Albuterol

## 2013-11-19 ENCOUNTER — Other Ambulatory Visit: Payer: Self-pay | Admitting: Women's Health

## 2013-11-19 DIAGNOSIS — N946 Dysmenorrhea, unspecified: Secondary | ICD-10-CM

## 2013-11-19 MED ORDER — TRAMADOL HCL 50 MG PO TABS: ORAL_TABLET | ORAL | Status: DC

## 2013-11-19 NOTE — Telephone Encounter (Signed)
Pt cycle started today and takes for cramps

## 2013-11-19 NOTE — Telephone Encounter (Signed)
Called into pharmacy

## 2013-11-19 NOTE — Telephone Encounter (Signed)
Dr. Loetta Rough is backup for [place].

## 2013-11-30 ENCOUNTER — Encounter: Payer: Self-pay | Admitting: Women's Health

## 2013-12-12 ENCOUNTER — Encounter: Payer: Self-pay | Admitting: Women's Health

## 2013-12-21 ENCOUNTER — Ambulatory Visit
Admission: RE | Admit: 2013-12-21 | Discharge: 2013-12-21 | Disposition: A | Discharge status: Home / Self Care | Payer: No Typology Code available for payment source | Source: Ambulatory Visit | Attending: Physician Assistant | Admitting: Physician Assistant

## 2013-12-21 ENCOUNTER — Other Ambulatory Visit: Payer: Self-pay | Admitting: Physician Assistant

## 2013-12-21 DIAGNOSIS — R52 Pain, unspecified: Secondary | ICD-10-CM

## 2013-12-22 ENCOUNTER — Encounter: Payer: Self-pay | Admitting: Gynecology

## 2013-12-22 ENCOUNTER — Ambulatory Visit (INDEPENDENT_AMBULATORY_CARE_PROVIDER_SITE_OTHER): Payer: No Typology Code available for payment source | Admitting: Gynecology

## 2013-12-22 DIAGNOSIS — A499 Bacterial infection, unspecified: Secondary | ICD-10-CM

## 2013-12-22 DIAGNOSIS — N760 Acute vaginitis: Secondary | ICD-10-CM

## 2013-12-22 DIAGNOSIS — B9689 Other specified bacterial agents as the cause of diseases classified elsewhere: Secondary | ICD-10-CM

## 2013-12-22 LAB — WET PREP FOR TRICH, YEAST, CLUE
TRICH WET PREP: NONE SEEN
WBC WET PREP: NONE SEEN
Yeast Wet Prep HPF POC: NONE SEEN

## 2013-12-22 MED ORDER — FLUCONAZOLE 150 MG PO TABS: 150.0000 mg | ORAL_TABLET | Freq: Once | ORAL | Status: DC

## 2013-12-22 MED ORDER — METRONIDAZOLE 500 MG PO TABS: 500.0000 mg | ORAL_TABLET | Freq: Two times a day (BID) | ORAL | Status: DC

## 2013-12-22 NOTE — Addendum Note (Signed)
Addended by: Nelva Nay on: 12/22/2013 11:22 AM   Modules accepted: Orders

## 2013-12-22 NOTE — Progress Notes (Signed)
Penny Thomas 05-May-1986 [id number]        28 y.o.  G3P3 presents with several week history of vaginal discharge with odor. No itching. No urinary symptoms such as frequency dysuria urgency or low back pain. No abdominal pain. No fever chills  Past medical history,surgical history, problem list, medications, allergies, family history and social history were all reviewed and documented in the EPIC chart.  Directed ROS with pertinent positives and negatives documented in the history of present illness/assessment and plan.  Exam: Kim assistant General appearance:  Normal Abdomen soft nontender without masses guarding rebound Pelvic external BUS vagina with yellowish discharge. Cervix normal. Uterus normal size mobile nontender. Adnexa without masses or tenderness.  Assessment/Plan:  28 y.o. G3P3 with symptoms and wet prep consistent with bacterial vaginosis. Options for treatment reviewed and patient elects for Flagyl 500 mg twice a day x7 days, alcohol avoidance reviewed. Followup if symptoms persist, worsen or recur.   Note: This document was prepared with digital dictation and possible smart phrase technology. Any transcriptional errors that result from this process are unintentional.   Anastasio Auerbach MD, 10:20 AM 12/22/2013

## 2013-12-22 NOTE — Patient Instructions (Signed)
Take Flagyl medication twice daily for 7 days. Avoid alcohol while taking. Followup if symptoms persist, worsen or recur.  Bacterial Vaginosis Bacterial vaginosis is a vaginal infection that occurs when the normal balance of bacteria in the vagina is disrupted. It results from an overgrowth of certain bacteria. This is the most common vaginal infection in women of childbearing age. Treatment is important to prevent complications, especially in pregnant women, as it can cause a premature delivery. CAUSES  Bacterial vaginosis is caused by an increase in harmful bacteria that are normally present in smaller amounts in the vagina. Several different kinds of bacteria can cause bacterial vaginosis. However, the reason that the condition develops is not fully understood. RISK FACTORS Certain activities or behaviors can put you at an increased risk of developing bacterial vaginosis, including:  Having a new sex partner or multiple sex partners.  Douching.  Using an intrauterine device (IUD) for contraception. Women do not get bacterial vaginosis from toilet seats, bedding, swimming pools, or contact with objects around them. SIGNS AND SYMPTOMS  Some women with bacterial vaginosis have no signs or symptoms. Common symptoms include:  Grey vaginal discharge.  A fishlike odor with discharge, especially after sexual intercourse.  Itching or burning of the vagina and vulva.  Burning or pain with urination. DIAGNOSIS  Your health care provider will take a medical history and examine the vagina for signs of bacterial vaginosis. A sample of vaginal fluid may be taken. Your health care provider will look at this sample under a microscope to check for bacteria and abnormal cells. A vaginal pH test may also be done.  TREATMENT  Bacterial vaginosis may be treated with antibiotic medicines. These may be given in the form of a pill or a vaginal cream. A second round of antibiotics may be prescribed if the  condition comes back after treatment.  HOME CARE INSTRUCTIONS   Only take over-the-counter or prescription medicines as directed by your health care provider.  If antibiotic medicine was prescribed, take it as directed. Make sure you finish it even if you start to feel better.  Do not have sex until treatment is completed.  Tell all sexual partners that you have a vaginal infection. They should see their health care provider and be treated if they have problems, such as a mild rash or itching.  Practice safe sex by using condoms and only having one sex partner. SEEK MEDICAL CARE IF:   Your symptoms are not improving after 3 days of treatment.  You have increased discharge or pain.  You have a fever. MAKE SURE YOU:   Understand these instructions.  Will watch your condition.  Will get help right away if you are not doing well or get worse. FOR MORE INFORMATION  Centers for Disease Control and Prevention, Division of STD Prevention: AppraiserFraud.fi American Sexual Health Association (ASHA): www.ashastd.org  Document Released: 05/13/2005 Document Revised: 03/03/2013 Document Reviewed: 12/23/2012 [facility] Patient Information 2015 [place], [place]. This information is not intended to replace advice given to you by your health care provider. Make sure you discuss any questions you have with your health care provider.

## 2014-01-17 ENCOUNTER — Ambulatory Visit (INDEPENDENT_AMBULATORY_CARE_PROVIDER_SITE_OTHER): Payer: No Typology Code available for payment source | Admitting: Family Medicine

## 2014-01-17 VITALS — BP 108/80 | HR 91 | Temp 98.1°F | Resp 18 | Ht 68.0 in | Wt 176.0 lb

## 2014-01-17 DIAGNOSIS — R05 Cough: Secondary | ICD-10-CM

## 2014-01-17 DIAGNOSIS — J0110 Acute frontal sinusitis, unspecified: Secondary | ICD-10-CM

## 2014-01-17 DIAGNOSIS — R0982 Postnasal drip: Secondary | ICD-10-CM

## 2014-01-17 DIAGNOSIS — R059 Cough, unspecified: Secondary | ICD-10-CM

## 2014-01-17 MED ORDER — CEFDINIR 300 MG PO CAPS
300.0000 mg | ORAL_CAPSULE | Freq: Two times a day (BID) | ORAL | Status: DC
Start: 2014-01-17 — End: 2014-06-22

## 2014-01-17 MED ORDER — HYDROCODONE-HOMATROPINE 5-1.5 MG/5ML PO SYRP: 5.0000 mL | ORAL_SOLUTION | Freq: Three times a day (TID) | ORAL | Status: DC | PRN

## 2014-01-17 MED ORDER — IPRATROPIUM BROMIDE 0.03 % NA SOLN: 2.0000 | Freq: Four times a day (QID) | NASAL | Status: DC

## 2014-01-17 NOTE — Progress Notes (Signed)
[facility] [address] [place] [address] [phone]  Date:  01/17/2014   Name:  Penny Thomas   DOB:  09/05/1985   MRN:  [id number]  PCP:  No PCP Per Patient    Chief Complaint: URI   History of Present Illness:  Deanndra Kirley is a 28 y.o. very pleasant female patient who presents with the following:  She is here with congestion and sinus pressure for about one week.  She notes discolored nasal discharge.   She does have some cough- she is not sure if this is just due to PND, or if it is a separate thing.  She did notice a temperature over the last few days- did not check but felt it subjectivley. She has also noted chills and aches.  She has felt tired.   No GI symptoms She has a mild ST.  No antipyretics today. She did take ibuprofen yesterday. nyquil the day before.  No sick contacts at home.  She is a non- smoker.    Patient Active Problem List   Diagnosis Date Noted  . Dysmenorrhea 04/08/2013  . Endometriosis 04/08/2013    Past Medical History  Diagnosis Date  . Asthma   . Migraines   . Hemorrhage in uterus 01-20-09  . Sickle cell trait   . Blood transfusion 2007  . Blood transfusion 2010  . Seasonal allergies   . Allergy   . Blood transfusion without reported diagnosis   . Sickle cell anemia     trait    Past Surgical History  Procedure Laterality Date  . Cesarean section  09-28-05  . Cesarean section  01-20-09  . Polyp removal  5-08    ?CERVICAL OR UTERINE POLYP  . Tubal ligation  01-20-09    DONE WITH C-SECTION  . Endometrial ablation  10/2011    HEROPTIOIN  . Wisdom tooth extraction Right 2014    History  Substance Use Topics  . Smoking status: Never Smoker   . Smokeless tobacco: Never Used  . Alcohol Use: Yes     Comment: seldom    Family History  Problem Relation Age of Onset  . Endometriosis Sister     No Known Allergies  Medication list has been reviewed and updated.  Current Outpatient  Prescriptions on File Prior to Visit  Medication Sig Dispense Refill  . albuterol (PROVENTIL HFA;VENTOLIN HFA) 108 (90 BASE) MCG/ACT inhaler Inhale 1-2 puffs into the lungs every 6 (six) hours as needed for wheezing or shortness of breath.  1 Inhaler  0  . ALBUTEROL IN Inhale into the lungs.      . Cyanocobalamin (B-12 PO) Take by mouth.      . Hydrocodone-Acetaminophen (VICODIN) 5-300 MG TABS Take 1 tablet by mouth every 8 hours  30 each  0  . ibuprofen (ADVIL,MOTRIN) 600 MG tablet Take 1 tablet (600 mg total) by mouth every 6 (six) hours as needed.  60 tablet  1  . IRON PO Take by mouth.      Marland Kitchen PRESCRIPTION MEDICATION MIGRAINE RX-IFONETHEPT      . traMADol (ULTRAM) 50 MG tablet TAKE ONE- TWO TABLETS BY MOUTH EVERY 6 HOURS AS NEEDED FOR PAIN  30 tablet  0   No current facility-administered medications on file prior to visit.    Review of Systems:  As per HPI- otherwise negative.   Physical Examination: Filed Vitals:   01/17/14 1314  BP: 108/80  Pulse: 91  Temp: 98.1 F (36.7 C)  Resp: 18   Filed Vitals:   01/17/14 1314  Height: 5\' 8"  (1.727 m)  Weight: 176 lb (79.833 kg)   Body mass index is 26.77 kg/(m^2). Ideal Body Weight: Weight in (lb) to have BMI = 25: 164.1  GEN: WDWN, NAD, Non-toxic, A & O x 3, looks well but is congested HEENT: Atraumatic, Normocephalic. Neck supple. No masses, No LAD. Bilateral TM wnl, oropharynx normal.  PEERL,EOMI.  Ears and Nose: No external deformity. CV: RRR, No M/G/R. No JVD. No thrill. No extra heart sounds. PULM: CTA B, no wheezes, crackles, rhonchi. No retractions. No resp. distress. No accessory muscle use.Marland Kitchen EXTR: No c/c/e NEURO Normal gait.  PSYCH: Normally interactive. Conversant. Not depressed or anxious appearing.  Calm demeanor.    Assessment and Plan: Acute frontal sinusitis, recurrence not specified - Plan: cefdinir (OMNICEF) 300 MG capsule  Cough - Plan: HYDROcodone-homatropine (HYCODAN) 5-1.5 MG/5ML syrup  PND  (post-nasal drip) - Plan: ipratropium (ATROVENT) 0.03 % nasal spray  Treat for sinusitis as above.   Will plan further follow- up pending labs.   Signed Lamar Blinks, MD

## 2014-01-17 NOTE — Patient Instructions (Signed)
Use the cough syrup as needed at night- do not drive on this as it will make you sleepy.  Don't take this and tramadol! Use the omnicef twice a day for 10 days for you sinuses Use the atrovent nasal spray up to 4x a day as needed for drainage.   Let me know if you are not better in the next few days- Sooner if worse.

## 2014-01-25 DIAGNOSIS — M6751 Plica syndrome, right knee: Secondary | ICD-10-CM

## 2014-01-25 DIAGNOSIS — M94261 Chondromalacia, right knee: Secondary | ICD-10-CM

## 2014-01-25 HISTORY — DX: Plica syndrome, right knee: M67.51

## 2014-01-25 HISTORY — DX: Chondromalacia, right knee: M94.261

## 2014-01-28 ENCOUNTER — Other Ambulatory Visit: Payer: Self-pay | Admitting: Orthopedic Surgery

## 2014-02-10 ENCOUNTER — Encounter (HOSPITAL_BASED_OUTPATIENT_CLINIC_OR_DEPARTMENT_OTHER): Payer: Self-pay | Admitting: *Deleted

## 2014-02-15 NOTE — H&P (Signed)
Penny Thomas is an 28 y.o. female.    Chief Complaint: Right knee pain  HPI: Subjective: Patient presents for followup of right knee pain and right knee MRI results.  She has noted persistent right knee pain.  Location of pain is primarily anterior parapatellar.  Symptoms are a regular center from seated position or bending her knee of any type.  She denies any pain which wakes her at night.  She did have a corticosteroid injection last month with her primary care physician, which she said did not provide even one hour of relief.  Past Medical History  Diagnosis Date  . Asthma   . Hemorrhage in uterus 01-20-09  . Sickle cell trait   . Blood transfusion 2007  . Blood transfusion 2010  . Seasonal allergies   . Allergy   . Blood transfusion without reported diagnosis   . Sickle cell anemia     trait  . Migraines   . Asthma     prn inhaler  . Anemia     takes iron supplement  . 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 Cesarean section  09-28-05  . Cesarean section  01-20-09  . Polyp removal  5-08    ?CERVICAL OR UTERINE POLYP  . Tubal ligation  01-20-09    DONE WITH C-SECTION  . Endometrial ablation  10/2011    HEROPTIOIN  . Wisdom tooth extraction Right 2014  . Cesarean section      Family History  Problem Relation Age of Onset  . Endometriosis Sister    Social History:  reports that she has never smoked. She has never used smokeless tobacco. She reports that she does not drink alcohol or use illicit drugs.  Allergies: No Known Allergies  No prescriptions prior to admission    No results found for this or any previous visit (from the past 48 hour(s)). No results found.  Review of Systems  Constitutional: Negative.   HENT: Positive for hearing loss.   Eyes: Negative.   Respiratory: Negative.   Cardiovascular: Negative.   Gastrointestinal: Negative.   Genitourinary:  Negative.   Musculoskeletal: Positive for joint pain.  Skin: Negative.   Neurological: Negative.   Endo/Heme/Allergies: Negative.   Psychiatric/Behavioral: Negative.     Height 5\' 7"  (1.702 m), weight 78.926 kg (174 lb), last menstrual period 01/26/2014. Physical Exam  Constitutional: She is oriented to person, place, and time. She appears well-developed and well-nourished.  HENT:  Head: Normocephalic and atraumatic.  Eyes: Pupils are equal, round, and reactive to light.  Neck: Normal range of motion. Neck supple.  Cardiovascular: Intact distal pulses.   Respiratory: Effort normal.  Musculoskeletal: She exhibits tenderness.  She is awake alert and oriented x3.  Extraocular motion is intact.  No use of accessory muscles for breathing.  No skin rash.  Examination of the right knee reveals motion from -5-130.  No obvious effusion.  Positive patellar grind test.  No joint line tenderness.  No quadriceps atrophy.  Slightly decreased quad strength on the right compared to left.  Negative Lachman's.  Neurological: She is alert and oriented to person, place, and time.  Skin: Skin is warm and dry.  Psychiatric: She has a normal mood and affect. Her behavior is normal. Judgment and thought content normal.     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   Plan: The patient  tolerated the injection well with significant improvement of right knee pain symptoms.  We do believe that her pain is likely related then to the symptomatic plica.  We will see if this injection results are symptoms.  If her symptoms return, we instructed her to call the clinic for consideration of arthroscopic plica removal, which may be set up over the telephone.  She will otherwise return as needed.  Radie Berges R 02/15/2014, 8:40 AM

## 2014-02-16 ENCOUNTER — Ambulatory Visit (HOSPITAL_BASED_OUTPATIENT_CLINIC_OR_DEPARTMENT_OTHER): Payer: No Typology Code available for payment source | Admitting: Anesthesiology

## 2014-02-16 ENCOUNTER — Encounter (HOSPITAL_BASED_OUTPATIENT_CLINIC_OR_DEPARTMENT_OTHER)
Admission: RE | Disposition: A | Discharge status: Home / Self Care | Payer: Self-pay | Source: Ambulatory Visit | Attending: Orthopedic Surgery

## 2014-02-16 ENCOUNTER — Ambulatory Visit (HOSPITAL_BASED_OUTPATIENT_CLINIC_OR_DEPARTMENT_OTHER)
Admission: RE | Admit: 2014-02-16 | Discharge: 2014-02-16 | Disposition: A | Discharge status: Home / Self Care | Payer: No Typology Code available for payment source | Source: Ambulatory Visit | Attending: Orthopedic Surgery | Admitting: Orthopedic Surgery

## 2014-02-16 ENCOUNTER — Encounter (HOSPITAL_BASED_OUTPATIENT_CLINIC_OR_DEPARTMENT_OTHER): Payer: Self-pay | Admitting: *Deleted

## 2014-02-16 ENCOUNTER — Encounter (HOSPITAL_BASED_OUTPATIENT_CLINIC_OR_DEPARTMENT_OTHER): Payer: No Typology Code available for payment source | Admitting: Anesthesiology

## 2014-02-16 DIAGNOSIS — J45909 Unspecified asthma, uncomplicated: Secondary | ICD-10-CM | POA: Diagnosis not present

## 2014-02-16 DIAGNOSIS — G43909 Migraine, unspecified, not intractable, without status migrainosus: Secondary | ICD-10-CM | POA: Insufficient documentation

## 2014-02-16 DIAGNOSIS — M6750 Plica syndrome, unspecified knee: Secondary | ICD-10-CM | POA: Diagnosis present

## 2014-02-16 DIAGNOSIS — M2340 Loose body in knee, unspecified knee: Secondary | ICD-10-CM | POA: Insufficient documentation

## 2014-02-16 DIAGNOSIS — M6751 Plica syndrome, right knee: Secondary | ICD-10-CM

## 2014-02-16 DIAGNOSIS — D573 Sickle-cell trait: Secondary | ICD-10-CM | POA: Diagnosis not present

## 2014-02-16 DIAGNOSIS — M9420 Chondromalacia, unspecified site: Secondary | ICD-10-CM | POA: Diagnosis not present

## 2014-02-16 HISTORY — DX: Plica syndrome, right knee: M67.51

## 2014-02-16 HISTORY — DX: Chondromalacia, right knee: M94.261

## 2014-02-16 HISTORY — DX: Anemia, unspecified: D64.9

## 2014-02-16 HISTORY — DX: Unspecified asthma, uncomplicated: J45.909

## 2014-02-16 HISTORY — PX: KNEE ARTHROSCOPY: SHX127

## 2014-02-16 LAB — POCT HEMOGLOBIN-HEMACUE: HEMOGLOBIN: 14.4 g/dL (ref 12.0–15.0)

## 2014-02-16 SURGERY — ARTHROSCOPY, KNEE
Anesthesia: General | Site: Knee | Laterality: Right

## 2014-02-16 MED ORDER — CEFAZOLIN SODIUM-DEXTROSE 2-3 GM-% IV SOLR: 2.0000 g | INTRAVENOUS | Status: DC

## 2014-02-16 MED ORDER — ONDANSETRON HCL 4 MG/2ML IJ SOLN: 4.0000 mg | Freq: Once | INTRAMUSCULAR | Status: DC | PRN

## 2014-02-16 MED ORDER — SODIUM CHLORIDE 0.9 % IR SOLN
Status: DC | PRN
  Administered 2014-02-16: 2000 mL

## 2014-02-16 MED ORDER — LACTATED RINGERS IV SOLN
INTRAVENOUS | Status: DC
  Administered 2014-02-16 (×2): via INTRAVENOUS

## 2014-02-16 MED ORDER — DEXTROSE-NACL 5-0.45 % IV SOLN: INTRAVENOUS | Status: DC

## 2014-02-16 MED ORDER — LIDOCAINE HCL (CARDIAC) 20 MG/ML IV SOLN
INTRAVENOUS | Status: DC | PRN
  Administered 2014-02-16: 50 mg via INTRAVENOUS

## 2014-02-16 MED ORDER — HYDROMORPHONE HCL 1 MG/ML IJ SOLN
0.2500 mg | INTRAMUSCULAR | Status: DC | PRN
Start: 2014-02-16 — End: 2014-02-16

## 2014-02-16 MED ORDER — FENTANYL CITRATE 0.05 MG/ML IJ SOLN
INTRAMUSCULAR | Status: DC | PRN
  Administered 2014-02-16 (×2): 50 ug via INTRAVENOUS
  Administered 2014-02-16 (×2): 25 ug via INTRAVENOUS

## 2014-02-16 MED ORDER — FENTANYL CITRATE 0.05 MG/ML IJ SOLN
INTRAMUSCULAR | Status: AC
  Filled 2014-02-16: qty 6

## 2014-02-16 MED ORDER — CEFAZOLIN SODIUM-DEXTROSE 2-3 GM-% IV SOLR
INTRAVENOUS | Status: AC
  Filled 2014-02-16: qty 50

## 2014-02-16 MED ORDER — DEXAMETHASONE SODIUM PHOSPHATE 4 MG/ML IJ SOLN
INTRAMUSCULAR | Status: DC | PRN
  Administered 2014-02-16: 10 mg via INTRAVENOUS

## 2014-02-16 MED ORDER — ONDANSETRON HCL 4 MG/2ML IJ SOLN
INTRAMUSCULAR | Status: DC | PRN
  Administered 2014-02-16: 4 mg via INTRAVENOUS

## 2014-02-16 MED ORDER — OXYCODONE HCL 5 MG/5ML PO SOLN: 5.0000 mg | Freq: Once | ORAL | Status: AC | PRN

## 2014-02-16 MED ORDER — MIDAZOLAM HCL 5 MG/5ML IJ SOLN
INTRAMUSCULAR | Status: DC | PRN
  Administered 2014-02-16: 2 mg via INTRAVENOUS

## 2014-02-16 MED ORDER — OXYCODONE HCL 5 MG PO TABS
ORAL_TABLET | ORAL | Status: AC
  Filled 2014-02-16: qty 1

## 2014-02-16 MED ORDER — SCOPOLAMINE 1 MG/3DAYS TD PT72
1.0000 | MEDICATED_PATCH | TRANSDERMAL | Status: DC
  Administered 2014-02-16: 1.5 mg via TRANSDERMAL

## 2014-02-16 MED ORDER — PROPOFOL 10 MG/ML IV BOLUS
INTRAVENOUS | Status: DC | PRN
  Administered 2014-02-16: 200 mg via INTRAVENOUS

## 2014-02-16 MED ORDER — KETOROLAC TROMETHAMINE 30 MG/ML IJ SOLN
INTRAMUSCULAR | Status: DC | PRN
  Administered 2014-02-16: 30 mg via INTRAVENOUS

## 2014-02-16 MED ORDER — MIDAZOLAM HCL 2 MG/2ML IJ SOLN: 1.0000 mg | INTRAMUSCULAR | Status: DC | PRN

## 2014-02-16 MED ORDER — OXYCODONE HCL 5 MG PO TABS
5.0000 mg | ORAL_TABLET | Freq: Once | ORAL | Status: AC | PRN
  Administered 2014-02-16: 5 mg via ORAL

## 2014-02-16 MED ORDER — CHLORHEXIDINE GLUCONATE 4 % EX LIQD: 60.0000 mL | Freq: Once | CUTANEOUS | Status: DC

## 2014-02-16 MED ORDER — HYDROCODONE-ACETAMINOPHEN 5-325 MG PO TABS: 1.0000 | ORAL_TABLET | Freq: Four times a day (QID) | ORAL | Status: DC | PRN

## 2014-02-16 MED ORDER — LACTATED RINGERS IV SOLN
INTRAVENOUS | Status: DC | PRN
  Administered 2014-02-16: 11:00:00 via INTRAVENOUS

## 2014-02-16 MED ORDER — MIDAZOLAM HCL 2 MG/ML PO SYRP: 12.0000 mg | ORAL_SOLUTION | Freq: Once | ORAL | Status: DC | PRN

## 2014-02-16 MED ORDER — MIDAZOLAM HCL 2 MG/2ML IJ SOLN
INTRAMUSCULAR | Status: AC
  Filled 2014-02-16: qty 2

## 2014-02-16 MED ORDER — EPINEPHRINE HCL 1 MG/ML IJ SOLN
INTRAMUSCULAR | Status: DC | PRN
  Administered 2014-02-16: 1 mg

## 2014-02-16 MED ORDER — BUPIVACAINE-EPINEPHRINE 0.5% -1:200000 IJ SOLN
INTRAMUSCULAR | Status: DC | PRN
  Administered 2014-02-16: 30 mL

## 2014-02-16 MED ORDER — FENTANYL CITRATE 0.05 MG/ML IJ SOLN: 50.0000 ug | INTRAMUSCULAR | Status: DC | PRN

## 2014-02-16 SURGICAL SUPPLY — 41 items
BANDAGE ELASTIC 6 VELCRO ST LF (GAUZE/BANDAGES/DRESSINGS) ×2 IMPLANT
BLADE 4.2CUDA (BLADE) IMPLANT
BLADE CUTTER GATOR 3.5 (BLADE) ×1 IMPLANT
BLADE GREAT WHITE 4.2 (BLADE) IMPLANT
BNDG COHESIVE 6X5 TAN STRL LF (GAUZE/BANDAGES/DRESSINGS) ×2 IMPLANT
CANISTER SUCT 3000ML (MISCELLANEOUS) IMPLANT
DRAPE ARTHROSCOPY W/POUCH 114 (DRAPES) ×2 IMPLANT
DURAPREP 26ML APPLICATOR (WOUND CARE) ×2 IMPLANT
ELECT MENISCUS 165MM 90D (ELECTRODE) IMPLANT
ELECT REM PT RETURN 9FT ADLT (ELECTROSURGICAL)
ELECTRODE REM PT RTRN 9FT ADLT (ELECTROSURGICAL) IMPLANT
GAUZE SPONGE 4X4 12PLY STRL (GAUZE/BANDAGES/DRESSINGS) ×2 IMPLANT
GAUZE XEROFORM 1X8 LF (GAUZE/BANDAGES/DRESSINGS) ×2 IMPLANT
GLOVE BIO SURGEON STRL SZ7 (GLOVE) ×1 IMPLANT
GLOVE BIO SURGEON STRL SZ7.5 (GLOVE) ×4 IMPLANT
GLOVE BIO SURGEON STRL SZ8.5 (GLOVE) ×2 IMPLANT
GLOVE BIOGEL PI IND STRL 8 (GLOVE) ×2 IMPLANT
GLOVE BIOGEL PI IND STRL 9 (GLOVE) ×1 IMPLANT
GLOVE BIOGEL PI INDICATOR 8 (GLOVE) ×2
GLOVE BIOGEL PI INDICATOR 9 (GLOVE) ×1
GOWN STRL REUS W/ TWL LRG LVL3 (GOWN DISPOSABLE) IMPLANT
GOWN STRL REUS W/ TWL XL LVL3 (GOWN DISPOSABLE) ×2 IMPLANT
GOWN STRL REUS W/TWL LRG LVL3 (GOWN DISPOSABLE)
GOWN STRL REUS W/TWL XL LVL3 (GOWN DISPOSABLE) ×8
IV NS IRRIG 3000ML ARTHROMATIC (IV SOLUTION) ×2 IMPLANT
KNEE WRAP E Z 3 GEL PACK (MISCELLANEOUS) ×2 IMPLANT
MANIFOLD NEPTUNE II (INSTRUMENTS) ×2 IMPLANT
NDL SAFETY ECLIPSE 18X1.5 (NEEDLE) ×1 IMPLANT
NEEDLE HYPO 18GX1.5 SHARP (NEEDLE) ×2
PACK ARTHROSCOPY DSU (CUSTOM PROCEDURE TRAY) ×2 IMPLANT
PACK BASIN DAY SURGERY FS (CUSTOM PROCEDURE TRAY) ×2 IMPLANT
PAD ALCOHOL SWAB (MISCELLANEOUS) ×2 IMPLANT
PENCIL BUTTON HOLSTER BLD 10FT (ELECTRODE) IMPLANT
SET ARTHROSCOPY TUBING (MISCELLANEOUS) ×2
SET ARTHROSCOPY TUBING LN (MISCELLANEOUS) ×1 IMPLANT
SLEEVE SCD COMPRESS KNEE MED (MISCELLANEOUS) ×1 IMPLANT
SYR 3ML 18GX1 1/2 (SYRINGE) IMPLANT
SYR 5ML LL (SYRINGE) ×2 IMPLANT
TOWEL OR 17X24 6PK STRL BLUE (TOWEL DISPOSABLE) ×2 IMPLANT
WAND STAR VAC 90 (SURGICAL WAND) IMPLANT
WATER STERILE IRR 1000ML POUR (IV SOLUTION) ×2 IMPLANT

## 2014-02-16 NOTE — Op Note (Signed)
Pre-Op Dx: Symptomatic right knee plica, small loose body  Postop Dx: Same   Procedure: Right knee arthroscopic removal of shelf plica and small chondral loose body 5 mm in width.  Surgeon: Kathalene Frames. Mayer Camel M.D.  Assist: Kerry Hough. Barton Dubois  (present throughout entire procedure and necessary for timely completion of the procedure) Anes: General LMA  EBL: Minimal  Fluids: 800 cc   Indications: Patient has catching popping pain and pressure the anterior aspect of her right knee plain radiographs are nondiagnostic MRI scan is consistent with a relatively large shelf plica. Pt has failed conservative treatment with anti-inflammatory medicines, physical therapy, and modified activites but did get good temporarily from an intra-articular cortisone injection. Pain has recurred and patient desires elective arthroscopic evaluation and treatment of knee. Risks and benefits of surgery have been discussed and questions answered.  Procedure: Patient identified by arm band and taken to the operating room at the day surgery Center. The appropriate anesthetic monitors were attached, and General LMA anesthesia was induced without difficulty. Lateral post was applied to the table and the lower extremity was prepped and draped in usual sterile fashion from the ankle to the midthigh. Time out procedure was performed. We began the operation by making standard inferior lateral and inferior medial peripatellar portals with a #11 blade allowing introduction of the arthroscope through the inferior lateral portal and the out flow to the inferior medial portal. Pump pressure was set at 100 mmHg and diagnostic arthroscopy  revealed a large peripatellar shelf plica that was excised with a 3.5 mm Gator sucker shaver the articular cartilage of the patellofemoral joint was in good condition. Moving the medial compartment the medial meniscus was intact there was a 5 mm wide loose body that was removed to the arthroscope and minimal  chondromalacia to the medial femoral condyle distally. The anterior cruciate ligament and PCL are intact. The lateral compartment was pristine. The gutters were cleared. The knee was irrigated out normal saline solution. A dressing of xerofoam 4 x 4 dressing sponges, web roll and an Ace wrap was applied. The patient was awakened extubated and taken to the recovery without difficulty.    Signed: Kerin Salen, MD

## 2014-02-16 NOTE — Transfer of Care (Signed)
Immediate Anesthesia Transfer of Care Note  Patient: Penny Thomas  Procedure(s) Performed: Procedure(s): RIGHT ARTHROSCOPY KNEE (Right)  Patient Location: PACU  Anesthesia Type:General  Level of Consciousness: awake and patient cooperative  Airway & Oxygen Therapy: Patient Spontanous Breathing and Patient connected to face mask oxygen  Post-op Assessment: Report given to PACU RN and Post -op Vital signs reviewed and stable  Post vital signs: Reviewed and stable  Complications: No apparent anesthesia complications

## 2014-02-16 NOTE — Interval H&P Note (Signed)
History and Physical Interval Note:  02/16/2014 11:25 AM  Penny Thomas  has presented today for surgery, with the diagnosis of RIGHT KNEE PLICA CHONDROMALACIA  The various methods of treatment have been discussed with the patient and family. After consideration of risks, benefits and other options for treatment, the patient has consented to  Procedure(s): RIGHT ARTHROSCOPY KNEE (Right) as a surgical intervention .  The patient's history has been reviewed, patient examined, no change in status, stable for surgery.  I have reviewed the patient's chart and labs.  Questions were answered to the patient's satisfaction.     Kerin Salen

## 2014-02-16 NOTE — Anesthesia Postprocedure Evaluation (Signed)
  Anesthesia Post-op Note  Patient: Penny Thomas  Procedure(s) Performed: Procedure(s): RIGHT ARTHROSCOPY KNEE (Right)  Patient Location: PACU  Anesthesia Type: General   Level of Consciousness: awake, alert  and oriented  Airway and Oxygen Therapy: Patient Spontanous Breathing  Post-op Pain: mild  Post-op Assessment: Post-op Vital signs reviewed  Post-op Vital Signs: Reviewed  Last Vitals:  Filed Vitals:   02/16/14 1300  BP: 137/87  Pulse: 85  Temp:   Resp: 13    Complications: No apparent anesthesia complications

## 2014-02-16 NOTE — Discharge Instructions (Signed)

## 2014-02-16 NOTE — Anesthesia Preprocedure Evaluation (Signed)

## 2014-02-17 ENCOUNTER — Encounter (HOSPITAL_BASED_OUTPATIENT_CLINIC_OR_DEPARTMENT_OTHER): Payer: Self-pay | Admitting: Orthopedic Surgery

## 2014-03-17 ENCOUNTER — Encounter: Payer: Self-pay | Admitting: Women's Health

## 2014-03-28 ENCOUNTER — Encounter (HOSPITAL_BASED_OUTPATIENT_CLINIC_OR_DEPARTMENT_OTHER): Payer: Self-pay | Admitting: Orthopedic Surgery

## 2014-04-12 ENCOUNTER — Encounter: Payer: No Typology Code available for payment source | Admitting: Women's Health

## 2014-05-18 ENCOUNTER — Other Ambulatory Visit: Payer: Self-pay | Admitting: Gynecology

## 2014-05-18 ENCOUNTER — Other Ambulatory Visit: Payer: Self-pay | Admitting: Family Medicine

## 2014-05-18 DIAGNOSIS — R05 Cough: Secondary | ICD-10-CM

## 2014-05-18 DIAGNOSIS — N946 Dysmenorrhea, unspecified: Secondary | ICD-10-CM

## 2014-05-18 DIAGNOSIS — R059 Cough, unspecified: Secondary | ICD-10-CM

## 2014-05-18 MED ORDER — TRAMADOL HCL 50 MG PO TABS: ORAL_TABLET | ORAL | Status: DC

## 2014-05-18 NOTE — Telephone Encounter (Signed)
Pt would like a refill on ultram, cycle has started. Okay to fill?

## 2014-05-18 NOTE — Telephone Encounter (Signed)
Rx called in 

## 2014-06-16 ENCOUNTER — Encounter: Payer: Self-pay | Admitting: Women's Health

## 2014-06-20 ENCOUNTER — Encounter: Payer: Self-pay | Admitting: Women's Health

## 2014-06-22 ENCOUNTER — Ambulatory Visit (INDEPENDENT_AMBULATORY_CARE_PROVIDER_SITE_OTHER): Payer: 59 | Admitting: Women's Health

## 2014-06-22 ENCOUNTER — Other Ambulatory Visit (HOSPITAL_COMMUNITY)
Admission: RE | Admit: 2014-06-22 | Discharge: 2014-06-22 | Disposition: A | Discharge status: Home / Self Care | Payer: 59 | Source: Ambulatory Visit | Attending: Gynecology | Admitting: Gynecology

## 2014-06-22 ENCOUNTER — Encounter: Payer: Self-pay | Admitting: Women's Health

## 2014-06-22 VITALS — BP 122/80 | Ht 68.0 in | Wt 187.0 lb

## 2014-06-22 DIAGNOSIS — N946 Dysmenorrhea, unspecified: Secondary | ICD-10-CM

## 2014-06-22 DIAGNOSIS — Z01419 Encounter for gynecological examination (general) (routine) without abnormal findings: Secondary | ICD-10-CM | POA: Insufficient documentation

## 2014-06-22 LAB — LIPID PANEL
Cholesterol: 195 mg/dL (ref 0–200)
HDL: 62 mg/dL (ref >39)
LDL Cholesterol: 102 mg/dL — ABNORMAL HIGH (ref 0–99)
Total CHOL/HDL Ratio: 3.1 Ratio
Triglycerides: 157 mg/dL — ABNORMAL HIGH (ref <150)
VLDL: 31 mg/dL (ref 0–40)

## 2014-06-22 LAB — CBC WITH DIFFERENTIAL/PLATELET
BASOS ABS: 0 10*3/uL (ref 0.0–0.1)
Basophils Relative: 0 % (ref 0–1)
Eosinophils Absolute: 0.1 10*3/uL (ref 0.0–0.7)
Eosinophils Relative: 1 % (ref 0–5)
HCT: 39.7 % (ref 36.0–46.0)
Hemoglobin: 13.6 g/dL (ref 12.0–15.0)
Lymphocytes Relative: 38 % (ref 12–46)
Lymphs Abs: 2 10*3/uL (ref 0.7–4.0)
MCH: 28.7 pg (ref 26.0–34.0)
MCHC: 34.3 g/dL (ref 30.0–36.0)
MCV: 83.8 fL (ref 78.0–100.0)
MPV: 9.8 fL (ref 8.6–12.4)
Monocytes Absolute: 0.4 10*3/uL (ref 0.1–1.0)
Monocytes Relative: 7 % (ref 3–12)
NEUTROS ABS: 2.9 10*3/uL (ref 1.7–7.7)
NEUTROS PCT: 54 % (ref 43–77)
Platelets: 251 10*3/uL (ref 150–400)
RBC: 4.74 MIL/uL (ref 3.87–5.11)
RDW: 14.5 % (ref 11.5–15.5)
WBC: 5.3 10*3/uL (ref 4.0–10.5)

## 2014-06-22 LAB — GLUCOSE, RANDOM: Glucose, Bld: 90 mg/dL (ref 70–99)

## 2014-06-22 MED ORDER — TRAMADOL HCL 50 MG PO TABS: ORAL_TABLET | ORAL | Status: DC

## 2014-06-22 NOTE — Patient Instructions (Signed)

## 2014-06-22 NOTE — Progress Notes (Signed)
Penny Thomas 03-Feb-1986 [id number]    History:    Presents for annual exam.  Regular monthly 3 day cycle/ BTL/endometrial ablation 10/2011. Had cycles that were debilitating from dysmenorrhea/endometriosis and menorrhagia much improvement after ablation. Uses occasional Ultram with menses. History of cryo-with normal Paps after. GDM.  Past medical history, past surgical history, family history and social history were all reviewed and documented in the EPIC chart. 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 Twins are 8 and has a 8-year-old all doing well. Donated donor eggs November 2015.  ROS:  A ROS was performed and pertinent positives and negatives are included.  Exam:  Filed Vitals:   06/22/14 1035  BP: 122/80    General appearance:  Normal Thyroid:  Symmetrical, normal in size, without palpable masses or nodularity. Respiratory  Auscultation:  Clear without wheezing or rhonchi Cardiovascular  Auscultation:  Regular rate, without rubs, murmurs or gallops  Edema/varicosities:  Not grossly evident Abdominal  Soft,nontender, without masses, guarding or rebound.  Liver/spleen:  No organomegaly noted  Hernia:  None appreciated  Skin  Inspection:  Grossly normal   Breasts: Examined lying and sitting.     Right: Without masses, retractions, discharge or axillary adenopathy.     Left: Without masses, retractions, discharge or axillary adenopathy. Gentitourinary   Inguinal/mons:  Normal without inguinal adenopathy  External genitalia:  Normal  BUS/Urethra/Skene's glands:  Normal  Vagina:  Normal menses  Cervix:  Normal  Uterus:  normal in size, shape and contour.  Midline and mobile  Adnexa/parametria:     Rt: Without masses or tenderness.   Lt: Without masses or tenderness.  Anus and perineum: Normal  Digital rectal exam: Normal sphincter tone without palpated masses or tenderness  Assessment/Plan:  29 y.o. MBF G2P3 for annual exam.    Monthly 3 day  cycle/BTL Dysmenorrhea/endometriosis-good relief after ablation 2013 Weight gain  Plan: Reviewed importance of increasing exercise and decreasing calories for weight loss, calcium rich diet, vitamin D 1000 daily encouraged. CBC, glucose, lipid panel, UA, Pap. New screening guidelines reviewed. Ultram 1-2 tablets every 8 hours as needed #30 prescription, proper use, addictive properties reviewed.    [facility], 12:54 PM 06/22/2014

## 2014-06-23 LAB — URINALYSIS W MICROSCOPIC + REFLEX CULTURE
Bilirubin Urine: NEGATIVE
Casts: NONE SEEN
Crystals: NONE SEEN
Glucose, UA: NEGATIVE mg/dL
KETONES UR: NEGATIVE mg/dL
LEUKOCYTES UA: NEGATIVE
Nitrite: NEGATIVE
PH: 5.5 (ref 5.0–8.0)
PROTEIN: NEGATIVE mg/dL
Specific Gravity, Urine: 1.013 (ref 1.005–1.030)
UROBILINOGEN UA: 0.2 mg/dL (ref 0.0–1.0)

## 2014-06-23 NOTE — Telephone Encounter (Signed)
Penny Thomas, I had replied to patient.  See her latest return reply . In light of her symptoms do you want to order more specific blood test for glucose/diabetes testing?  Her glucose on her met panel was fine.

## 2014-06-24 LAB — CYTOLOGY - PAP

## 2014-06-25 LAB — URINE CULTURE: Colony Count: 45000

## 2014-06-30 ENCOUNTER — Other Ambulatory Visit: Payer: Self-pay | Admitting: Women's Health

## 2014-06-30 ENCOUNTER — Encounter: Payer: Self-pay | Admitting: Women's Health

## 2014-06-30 DIAGNOSIS — R112 Nausea with vomiting, unspecified: Secondary | ICD-10-CM

## 2014-06-30 MED ORDER — PROMETHAZINE HCL 25 MG PO TABS: 25.0000 mg | ORAL_TABLET | Freq: Four times a day (QID) | ORAL | Status: DC | PRN

## 2014-07-01 ENCOUNTER — Other Ambulatory Visit: Payer: Self-pay

## 2014-07-18 ENCOUNTER — Encounter: Payer: Self-pay | Admitting: Women's Health

## 2014-07-19 ENCOUNTER — Encounter: Payer: Self-pay | Admitting: Women's Health

## 2014-07-22 ENCOUNTER — Telehealth: Payer: 59 | Admitting: Nurse Practitioner

## 2014-07-22 DIAGNOSIS — M545 Low back pain, unspecified: Secondary | ICD-10-CM

## 2014-07-22 MED ORDER — CYCLOBENZAPRINE HCL 10 MG PO TABS: 10.0000 mg | ORAL_TABLET | Freq: Three times a day (TID) | ORAL | Status: DC | PRN

## 2014-07-22 MED ORDER — NAPROXEN 500 MG PO TABS: 500.0000 mg | ORAL_TABLET | Freq: Two times a day (BID) | ORAL | Status: DC

## 2014-07-22 NOTE — Addendum Note (Signed)
Addended by: Chevis Pretty on: 07/22/2014 07:54 PM   Modules accepted: Orders

## 2014-07-22 NOTE — Progress Notes (Signed)
We are sorry that you are not feeling well.  Here is how we plan to help!  Based on what you have shared with me it looks like you mostly have acute back pain.  Acute back pain is defined as musculoskeletal pain that can resolve in 1-3 weeks with conservative treatment.  I have prescribed Naprosyn 500 mg twice a day non-steroid anti-inflammatory (NSAID) as well as Flexeril 10 mg every eight hours as needed which is a muscle relaxer.  Some patients experience stomach irritation or in increased heartburn with anti-inflammatory drugs.  Please keep in mind that muscle relaxer's can cause fatigue and should not be taken while at work or driving.  Back pain is very common.  The pain often gets better over time.  The cause of back pain is usually not dangerous.  Most people can learn to manage their back pain on their own.  Home Care  Stay active.  Start with short walks on flat ground if you can.  Try to walk farther each day.  Do not sit, drive or stand in one place for more than 30 minutes.  Do not stay in bed.  Do not avoid exercise or work.  Activity can help your back heal faster.  Be careful when you bend or lift an object.  Bend at your knees, keep the object close to you, and do not twist.  Sleep on a firm mattress.  Lie on your side, and bend your knees.  If you lie on your back, put a pillow under your knees.  Only take medicines as told by your doctor.  Put ice on the injured area.  Put ice in a plastic bag  Place a towel between your skin and the bag  Leave the ice on for 15-20 minutes, 3-4 times a day for the first 2-3 days.  After that, you can switch between ice and heat packs.  Ask your doctor about back exercises or massage.  Avoid feeling anxious or stressed.  Find good ways to deal with stress, such as exercise.  Get Help Right Way If:  Your pain does not go away with rest or medicine.  Your pain does not go away in 1 week.  You have new problems.  You do not  feel well.  The pain spreads into your legs.  You cannot control when you poop (bowel movement) or pee (urinate)  You feel sick to your stomach (nauseous) or throw up (vomit)  You have belly (abdominal) pain.  You feel like you may pass out (faint).  If you develop a fever.  Make Sure you:  Understand these instructions.  Will watch your condition  Will get help right away if you are not doing well or get worse.  Your e-visit answers were reviewed by a board certified advanced clinical practitioner to complete your personal care plan.  Depending on the condition, your plan could have included both over the counter or prescription medications.  If there is a problem please reply  once you have received a response from your provider.  Your safety is important to [place].  If you have drug allergies check your prescription carefully.    You can use MyChart to ask questions about today's visit, request a non-urgent call back, or ask for a work or school excuse.  You will get an e-mail in the next two days asking about your experience.  I hope that your e-visit has been valuable and will speed your recovery. Thank you  for using e-visits.

## 2014-08-14 ENCOUNTER — Encounter: Payer: Self-pay | Admitting: Women's Health

## 2014-08-15 ENCOUNTER — Ambulatory Visit (INDEPENDENT_AMBULATORY_CARE_PROVIDER_SITE_OTHER): Payer: 59

## 2014-08-15 ENCOUNTER — Other Ambulatory Visit: Payer: Self-pay | Admitting: Women's Health

## 2014-08-15 ENCOUNTER — Ambulatory Visit (INDEPENDENT_AMBULATORY_CARE_PROVIDER_SITE_OTHER): Payer: 59 | Admitting: Internal Medicine

## 2014-08-15 ENCOUNTER — Telehealth: Payer: No Typology Code available for payment source | Admitting: Physician Assistant

## 2014-08-15 VITALS — BP 110/82 | HR 120 | Temp 98.0°F | Resp 18 | Wt 185.0 lb

## 2014-08-15 DIAGNOSIS — R05 Cough: Secondary | ICD-10-CM

## 2014-08-15 DIAGNOSIS — J029 Acute pharyngitis, unspecified: Secondary | ICD-10-CM | POA: Diagnosis not present

## 2014-08-15 DIAGNOSIS — R5081 Fever presenting with conditions classified elsewhere: Secondary | ICD-10-CM

## 2014-08-15 DIAGNOSIS — R509 Fever, unspecified: Secondary | ICD-10-CM

## 2014-08-15 DIAGNOSIS — J209 Acute bronchitis, unspecified: Secondary | ICD-10-CM

## 2014-08-15 DIAGNOSIS — Z842 Family history of other diseases of the genitourinary system: Secondary | ICD-10-CM

## 2014-08-15 DIAGNOSIS — R059 Cough, unspecified: Secondary | ICD-10-CM

## 2014-08-15 LAB — POCT INFLUENZA A/B
Influenza A, POC: NEGATIVE
Influenza B, POC: NEGATIVE

## 2014-08-15 MED ORDER — BENZONATATE 200 MG PO CAPS: 200.0000 mg | ORAL_CAPSULE | Freq: Two times a day (BID) | ORAL | Status: DC | PRN

## 2014-08-15 MED ORDER — AZITHROMYCIN 250 MG PO TABS: ORAL_TABLET | ORAL | Status: DC

## 2014-08-15 NOTE — Patient Instructions (Signed)
Increase fluids. Tessalon perles as directed for couhg. Zithromax as directed. advil or ibuprofen as directed for aches and pains. Vit c  Return for re eval if not improved or worsening of your symptoms.

## 2014-08-15 NOTE — Progress Notes (Signed)
   Subjective:    Patient ID: Penny Thomas, female    DOB: Apr 19, 1986, 29 y.o.   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 Uses proventil inhaler prn for reactive airway disease. No t having any trouble breathing now. Non smoker T/L for birth control       Review of Systems  Constitutional: Positive for fever, chills, diaphoresis, activity change, appetite change and fatigue. Negative for unexpected weight change.  HENT: Positive for congestion, rhinorrhea and sinus pressure. Negative for drooling, ear discharge, ear pain, facial swelling and mouth sores.   Eyes: Positive for redness. Negative for visual disturbance.  Respiratory: Positive for cough. Negative for shortness of breath and wheezing.   Gastrointestinal: Negative for nausea and vomiting.  All other systems reviewed and are negative.      Objective:   Physical Exam  Constitutional: She is oriented to person, place, and time. She appears well-developed and well-nourished.  HENT:  Head: Normocephalic and atraumatic.  Right Ear: External ear normal.  Left Ear: External ear normal.  Nose: Nose normal.  Mouth/Throat: Oropharynx is clear and moist.  Eyes: Conjunctivae and EOM are normal. Pupils are equal, round, and reactive to light.  Neck: Normal range of motion. Neck supple.  Cardiovascular: Normal rate, regular rhythm and normal heart sounds.   Pulmonary/Chest: Effort normal and breath sounds normal. No respiratory  distress. She has no wheezes. She has no rales.  Coarse rhonchi in bases bilaterally  Abdominal: Soft. Bowel sounds are normal.  Musculoskeletal: Normal range of motion.  Lymphadenopathy:    She has no cervical adenopathy.  Neurological: She is alert and oriented to person, place, and time.  Skin: Skin is warm and dry.  Psychiatric: Her behavior is normal. Judgment and thought content normal.  Nursing note and vitals reviewed.   [facility] reading (PRIMARY) by  Dr. Benjaman Lobe.no pneumonia Increased bronchial markings in the lower lobes bilaterally.  Results for orders placed or performed in visit on 08/15/14  POCT Influenza A/B  Result Value Ref Range   Influenza A, POC Negative    Influenza B, POC Negative      Influenza is negitive Assessment & Plan:  1. Cough 2. sorethorat 3.Sinus congestion 4. Fever  Consistent with acute bronchitis Will rx with Zithromax, tessalon perle, otc nsai , increase fluids.

## 2014-08-16 NOTE — Progress Notes (Signed)
Based on what you shared with me it looks like you have a serious condition that should be evaluated in a face to face office visit.  I am sorry for the delayed response.  I was not on call last night, I am not sure who was, but when I saw the e-visit this morning, I wanted to promptly respond to you.  I would recommend that your see your primary medical provider for a good physical exam so that an accurate diagnosis can be made.  If it is the flu, we are outside the window where an antiviral medication will be beneficial.  If it is a bacterial infection, your primary care provider will be able to evaluate and give you the right treatment regimen.  If you are having a true medical emergency please call 911.  If you need an urgent face to face visit,  has four urgent care centers for your convenience.  . [facility] a Provider at this Location  [address] [place], [place] [address] . 8 am to 8 pm Monday-Friday . 9 am to 7 pm Saturday-Sunday  . [facility] a Provider at this Location  [address], [place], [place] [address] . 8 am to 8 pm Monday-Friday . 9 am to 6 pm Saturday . 11 am to 6 pm Sunday   . [facility] [place] Get Driving Directions  [address] [place], [place] [address] . 8 am to 8 pm Monday-Friday . 9 am to 4 pm Saturday-Sunday   . [facility] (a walk in primary care provider)  [place] a Provider at this Location  [place], [place] [address] . 8 am to 8:30 pm Monday-Thursday . 8 am to 6 pm Friday . 8 am to 4 pm Saturday-Sunday   Your e-visit answers were reviewed by a board certified advanced clinical practitioner to complete your personal care plan.  Depending on the condition,  your plan could have included both over the counter or prescription medications.  You will get an e-mail in the next two days asking about your experience.  I hope that your e-visit has been valuable and will speed your recovery . Thank you for choosing an e-visit.

## 2014-08-18 ENCOUNTER — Other Ambulatory Visit: Payer: 59

## 2014-08-18 ENCOUNTER — Telehealth: Payer: No Typology Code available for payment source | Admitting: Physician Assistant

## 2014-08-18 DIAGNOSIS — Z842 Family history of other diseases of the genitourinary system: Secondary | ICD-10-CM

## 2014-08-18 DIAGNOSIS — M5440 Lumbago with sciatica, unspecified side: Secondary | ICD-10-CM

## 2014-08-18 LAB — HEMOGLOBIN A1C
Hgb A1c MFr Bld: 5.9 % — ABNORMAL HIGH (ref <5.7)
Mean Plasma Glucose: 123 mg/dL — ABNORMAL HIGH (ref <117)

## 2014-08-18 MED ORDER — CYCLOBENZAPRINE HCL 10 MG PO TABS: 10.0000 mg | ORAL_TABLET | Freq: Three times a day (TID) | ORAL | Status: DC | PRN

## 2014-08-18 MED ORDER — NAPROXEN 500 MG PO TABS
500.0000 mg | ORAL_TABLET | Freq: Two times a day (BID) | ORAL | Status: DC
Start: 2014-08-18 — End: 2015-03-23

## 2014-08-18 NOTE — Progress Notes (Signed)
We are sorry that you are not feeling well.  Here is how we plan to help!  Based on what you have shared with me it looks like you mostly have acute back pain.  Acute back pain is defined as musculoskeletal pain that can resolve in 1-3 weeks with conservative treatment.  There also seems to be a component of sciatica.  I have prescribed Naprosyn 500 mg twice a day non-steroid anti-inflammatory (NSAID) as well as Flexeril 10 mg every eight hours as needed which is a muscle relaxer.  Some patients experience stomach irritation or in increased heartburn with anti-inflammatory drugs.  Please keep in mind that muscle relaxer's can cause fatigue and should not be taken while at work or driving.  Back pain is very common.  The pain often gets better over time.  The cause of back pain is usually not dangerous.  Most people can learn to manage their back pain on their own.   Home Care  Stay active.  Start with short walks on flat ground if you can.  Try to walk farther each day.  Do not sit, drive or stand in one place for more than 30 minutes.  Do not stay in bed.  Do not avoid exercise or work.  Activity can help your back heal faster.  Be careful when you bend or lift an object.  Bend at your knees, keep the object close to you, and do not twist.  Sleep on a firm mattress.  Lie on your side, and bend your knees.  If you lie on your back, put a pillow under your knees.  Only take medicines as told by your doctor.  Put ice on the injured area.  Put ice in a plastic bag  Place a towel between your skin and the bag  Leave the ice on for 15-20 minutes, 3-4 times a day for the first 2-3 days.  After that, you can switch between ice and heat packs.  Ask your doctor about back exercises or massage.  Avoid feeling anxious or stressed.  Find good ways to deal with stress, such as exercise.  Get Help Right Way If:  Your pain does not go away with rest or medicine.  Your pain does not go away in  1 week.  You have new problems.  You do not feel well.  The pain spreads into your legs.  You cannot control when you poop (bowel movement) or pee (urinate)  You feel sick to your stomach (nauseous) or throw up (vomit)  You have belly (abdominal) pain.  You feel like you may pass out (faint).  If you develop a fever.  Make Sure you:  Understand these instructions.  Will watch your condition  Will get help right away if you are not doing well or get worse.  Your e-visit answers were reviewed by a board certified advanced clinical practitioner to complete your personal care plan.  Depending on the condition, your plan could have included both over the counter or prescription medications.  If there is a problem please reply  once you have received a response from your provider.  Your safety is important to [place].  If you have drug allergies check your prescription carefully.    You can use MyChart to ask questions about today's visit, request a non-urgent call back, or ask for a work or school excuse.  You will get an e-mail in the next two days asking about your experience.  I hope that your  e-visit has been valuable and will speed your recovery. Thank you for using e-visits.

## 2014-08-19 ENCOUNTER — Encounter: Payer: Self-pay | Admitting: Women's Health

## 2014-08-19 LAB — LIPID PANEL
Cholesterol: 180 mg/dL (ref 0–200)
HDL: 51 mg/dL (ref >=46)
LDL CALC: 91 mg/dL (ref 0–99)
Total CHOL/HDL Ratio: 3.5 Ratio
Triglycerides: 190 mg/dL — ABNORMAL HIGH (ref <150)
VLDL: 38 mg/dL (ref 0–40)

## 2014-08-19 LAB — INSULIN, FASTING: Insulin fasting, serum: 21.4 u[IU]/mL — ABNORMAL HIGH (ref 2.0–19.6)

## 2014-08-22 ENCOUNTER — Other Ambulatory Visit: Payer: Self-pay | Admitting: Women's Health

## 2014-08-22 DIAGNOSIS — E161 Other hypoglycemia: Secondary | ICD-10-CM

## 2014-08-22 DIAGNOSIS — R7309 Other abnormal glucose: Secondary | ICD-10-CM

## 2014-08-29 ENCOUNTER — Ambulatory Visit (INDEPENDENT_AMBULATORY_CARE_PROVIDER_SITE_OTHER): Payer: 59 | Admitting: Internal Medicine

## 2014-08-29 ENCOUNTER — Other Ambulatory Visit: Payer: Self-pay

## 2014-08-29 ENCOUNTER — Other Ambulatory Visit: Payer: Self-pay | Admitting: Physician Assistant

## 2014-08-29 ENCOUNTER — Ambulatory Visit (INDEPENDENT_AMBULATORY_CARE_PROVIDER_SITE_OTHER): Payer: 59

## 2014-08-29 VITALS — BP 118/80 | HR 109 | Temp 98.0°F | Resp 18 | Ht 66.75 in | Wt 184.0 lb

## 2014-08-29 DIAGNOSIS — R109 Unspecified abdominal pain: Secondary | ICD-10-CM | POA: Diagnosis not present

## 2014-08-29 DIAGNOSIS — R143 Flatulence: Secondary | ICD-10-CM | POA: Diagnosis not present

## 2014-08-29 DIAGNOSIS — M5440 Lumbago with sciatica, unspecified side: Secondary | ICD-10-CM

## 2014-08-29 LAB — POCT URINALYSIS DIPSTICK
BILIRUBIN UA: NEGATIVE
Blood, UA: NEGATIVE
Glucose, UA: NEGATIVE
Ketones, UA: NEGATIVE
Leukocytes, UA: NEGATIVE
NITRITE UA: NEGATIVE
PH UA: 5
Protein, UA: NEGATIVE
Spec Grav, UA: 1.015
UROBILINOGEN UA: 0.2

## 2014-08-29 LAB — POCT CBC
Granulocyte percent: 58.4 %G (ref 37–80)
HCT, POC: 42 % (ref 37.7–47.9)
HEMOGLOBIN: 13.4 g/dL (ref 12.2–16.2)
LYMPH, POC: 2.4 (ref 0.6–3.4)
MCH, POC: 27.3 pg (ref 27–31.2)
MCHC: 31.8 g/dL (ref 31.8–35.4)
MCV: 85.8 fL (ref 80–97)
MID (cbc): 0.5 (ref 0–0.9)
MPV: 7.2 fL (ref 0–99.8)
POC GRANULOCYTE: 4 (ref 2–6.9)
POC LYMPH PERCENT: 34.4 %L (ref 10–50)
POC MID %: 7.2 %M (ref 0–12)
Platelet Count, POC: 267 10*3/uL (ref 142–424)
RBC: 4.9 M/uL (ref 4.04–5.48)
RDW, POC: 14.4 %
WBC: 6.9 10*3/uL (ref 4.6–10.2)

## 2014-08-29 LAB — COMPREHENSIVE METABOLIC PANEL
ALT: 11 U/L (ref 0–35)
AST: 16 U/L (ref 0–37)
Albumin: 4.5 g/dL (ref 3.5–5.2)
Alkaline Phosphatase: 62 U/L (ref 39–117)
BUN: 11 mg/dL (ref 6–23)
CALCIUM: 9.4 mg/dL (ref 8.4–10.5)
CHLORIDE: 103 meq/L (ref 96–112)
CO2: 27 meq/L (ref 19–32)
CREATININE: 0.94 mg/dL (ref 0.50–1.10)
Glucose, Bld: 98 mg/dL (ref 70–99)
POTASSIUM: 4.2 meq/L (ref 3.5–5.3)
Sodium: 138 mEq/L (ref 135–145)
Total Bilirubin: 0.8 mg/dL (ref 0.2–1.2)
Total Protein: 7.2 g/dL (ref 6.0–8.3)

## 2014-08-29 LAB — POCT UA - MICROSCOPIC ONLY
Bacteria, U Microscopic: NEGATIVE
CRYSTALS, UR, HPF, POC: NEGATIVE
Casts, Ur, LPF, POC: NEGATIVE
Mucus, UA: NEGATIVE
Yeast, UA: NEGATIVE

## 2014-08-29 LAB — SEDIMENTATION RATE: Sed Rate: 7 mm/hr (ref 0–20)

## 2014-08-29 MED ORDER — MELOXICAM 15 MG PO TABS: 15.0000 mg | ORAL_TABLET | Freq: Every day | ORAL | Status: DC

## 2014-08-29 MED ORDER — CYCLOBENZAPRINE HCL 10 MG PO TABS: 10.0000 mg | ORAL_TABLET | Freq: Three times a day (TID) | ORAL | Status: DC | PRN

## 2014-08-29 MED ORDER — OXYCODONE-ACETAMINOPHEN 5-325 MG PO TABS: 1.0000 | ORAL_TABLET | Freq: Three times a day (TID) | ORAL | Status: DC | PRN

## 2014-08-29 NOTE — Progress Notes (Signed)
MRN: [id number] DOB: 01-Sep-1985  Subjective:   Penny Thomas is a 29 y.o. female presenting for chief complaint of Flank Pain  Reports 2 day history of left sided flank/back pain, woke up Saturday morning with this constant achy pain, sometimes sharp. Has tried Naproxen, Tramadol and Flexeril x2 without relief. Denies fevers, chills, dysuria, hematuria, cloudy urine, n/v, abdominal pain, diarrhea, constipation, bloody stool, weight loss, decreased appetite, back trauma. Last bowel movement was yesterday, normal, no straining, soft stool. Denies history of renal stones, back surgeries, back injuries. Denies smoking or alcohol use. Denies any other aggravating or relieving factors, no other questions or concerns.  Penny Thomas has a current medication list which includes the following prescription(s): albuterol, amitriptyline, biotin, cyclobenzaprine, iron, naproxen, promethazine, and vitamin b-12. She has No Known Allergies.  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 Also  has past surgical history that includes Cesarean section (09-28-05); Cesarean section (01-20-09); POLYP REMOVAL (5-08); Tubal ligation (01-20-09); Endometrial ablation (10/2011); Wisdom tooth extraction (Right, 2014); Cesarean section; and Knee arthroscopy (Right, 02/16/2014).  ROS As in subjective.  Objective:   Vitals: BP 118/80 mmHg  Pulse 109  Temp(Src) 98 F (36.7 C) (Oral)  Resp 18  Ht 5' 6.75" (1.695 m)  Wt 184 lb (83.462 kg)  BMI 29.05 kg/m2  SpO2 100%  LMP 08/14/2014  Physical Exam  Constitutional: She is oriented to person, place, and time and well-developed, well-nourished, and in no distress.    Cardiovascular: Regular rhythm and intact distal pulses.  Exam reveals no gallop and no friction rub.   No murmur heard. Pulmonary/Chest: No respiratory distress. She has no wheezes. She has no rales. She exhibits no tenderness.  Abdominal: Soft. Bowel sounds are normal. She exhibits no distension and no mass. There is no tenderness.  No CVA tenderness.  Musculoskeletal:       Lumbar back: She exhibits decreased range of motion (limited flexion, extension, lateral flexion). She exhibits no tenderness, no bony tenderness, no swelling, no edema, no deformity, no laceration, no pain and no spasm.  Neurological: She is alert and oriented to person, place, and time.  Skin: Skin is warm and dry. No rash noted. No erythema. No pallor.   [facility] reading (PRIMARY) by  Dr. Elder Cyphers and PA-Georgiann Neider. Lumbar: Lumbar spine is normal, increased gas.  Dg Lumbar Spine 2-3 Views  08/29/2014   CLINICAL DATA:  Low back/left flank pain  EXAM: LUMBAR SPINE - 2-3 VIEW  COMPARISON:  None.  FINDINGS: Frontal and lateral views obtained. There are 5 non-rib-bearing lumbar type vertebral bodies. There is no fracture or spondylolisthesis. There is mild disc space narrowing at L4-5. Other disc spaces appear normal. No erosive change.  There is generalized bowel dilatation. No abnormal calcifications are appreciable on this study.  IMPRESSION: Mild disc space narrowing L4-5. No fracture or spondylolisthesis. Question a degree of bowel ileus.   Electronically Signed   By: Lowella Grip III M.D.   On: 08/29/2014 10:46   Results for orders placed or performed in visit on 08/29/14 (from the past 24 hour(s))  POCT urinalysis dipstick     Status: None   Collection Time: 08/29/14  9:01 AM  Result Value Ref Range   Color, UA yellow    Clarity, UA  clear    Glucose, UA neg    Bilirubin, UA neg    Ketones, UA neg    Spec Grav, UA 1.015    Blood, UA neg    pH, UA 5.0    Protein, UA neg    Urobilinogen, UA 0.2    Nitrite, UA neg     Leukocytes, UA Negative   POCT UA - Microscopic Only     Status: None   Collection Time: 08/29/14  9:01 AM  Result Value Ref Range   WBC, Ur, HPF, POC 0-4    RBC, urine, microscopic 0-2    Bacteria, U Microscopic neg    Mucus, UA neg    Epithelial cells, urine per micros 0-1    Crystals, Ur, HPF, POC neg    Casts, Ur, LPF, POC neg    Yeast, UA neg   POCT CBC     Status: None   Collection Time: 08/29/14  9:53 AM  Result Value Ref Range   WBC 6.9 4.6 - 10.2 K/uL   Lymph, poc 2.4 0.6 - 3.4   POC LYMPH PERCENT 34.4 10 - 50 %L   MID (cbc) 0.5 0 - 0.9   POC MID % 7.2 0 - 12 %M   POC Granulocyte 4.0 2 - 6.9   Granulocyte percent 58.4 37 - 80 %G   RBC 4.90 4.04 - 5.48 M/uL   Hemoglobin 13.4 12.2 - 16.2 g/dL   HCT, POC 42.0 37.7 - 47.9 %   MCV 85.8 80 - 97 fL   MCH, POC 27.3 27 - 31.2 pg   MCHC 31.8 31.8 - 35.4 g/dL   RDW, POC 14.4 %   Platelet Count, POC 267 142 - 424 K/uL   MPV 7.2 0 - 99.8 fL   Assessment and Plan :   1. Left flank pain 2. Excessive gas - Likely musculoskeletal in origin, start meloxicam 15mg , may use Percocet for breakthrough pain. Follow up in 3 days or sooner if worsening symptoms develop as discussed in clinic including fever, n/v, abdominal pain, inability to eat or defecate. Consider PT or ortho referral. - Consider ileus, bowel obstruction given x-ray findings, will call with results and check on progress.  Jaynee Eagles, PA-C [facility] [phone] 08/29/2014 8:35 AM

## 2014-08-29 NOTE — Patient Instructions (Signed)
Flank Pain °Flank pain refers to pain that is located on the side of the body between the upper abdomen and the back. The pain may occur over a short period of time (acute) or may be long-term or reoccurring (chronic). It may be mild or severe. Flank pain can be caused by many things. °CAUSES  °Some of the more common causes of flank pain include: °· Muscle strains.   °· Muscle spasms.   °· A disease of your spine (vertebral disk disease).   °· A lung infection (pneumonia).   °· Fluid around your lungs (pulmonary edema).   °· A kidney infection.   °· Kidney stones.   °· A very painful skin rash caused by the chickenpox virus (shingles).   °· Gallbladder disease.   °HOME CARE INSTRUCTIONS  °Home care will depend on the cause of your pain. In general, °· Rest as directed by your caregiver. °· Drink enough fluids to keep your urine clear or pale yellow. °· Only take over-the-counter or prescription medicines as directed by your caregiver. Some medicines may help relieve the pain. °· Tell your caregiver about any changes in your pain. °· Follow up with your caregiver as directed. °SEEK IMMEDIATE MEDICAL CARE IF:  °· Your pain is not controlled with medicine.   °· You have new or worsening symptoms. °· Your pain increases.   °· You have abdominal pain.   °· You have shortness of breath.   °· You have persistent nausea or vomiting.   °· You have swelling in your abdomen.   °· You feel faint or pass out.   °· You have blood in your urine. °· You have a fever or persistent symptoms for more than 2-3 days. °· You have a fever and your symptoms suddenly get worse. °MAKE SURE YOU:  °· Understand these instructions. °· Will watch your condition. °· Will get help right away if you are not doing well or get worse. °Document Released: 07/04/2005 Document Revised: 02/05/2012 Document Reviewed: 12/26/2011 °ExitCare® Patient Information ©2015 [employer]. This information is not intended to replace advice given to you by your  health care provider. Make sure you discuss any questions you have with your health care provider. ° °

## 2014-09-06 ENCOUNTER — Other Ambulatory Visit: Payer: Self-pay | Admitting: Women's Health

## 2014-09-06 ENCOUNTER — Encounter: Payer: Self-pay | Admitting: Women's Health

## 2014-09-06 DIAGNOSIS — N946 Dysmenorrhea, unspecified: Secondary | ICD-10-CM

## 2014-09-06 MED ORDER — CARISOPRODOL 350 MG PO TABS: 350.0000 mg | ORAL_TABLET | Freq: Four times a day (QID) | ORAL | Status: DC | PRN

## 2014-09-07 ENCOUNTER — Other Ambulatory Visit: Payer: Self-pay | Admitting: Women's Health

## 2014-09-07 MED ORDER — ONDANSETRON HCL 4 MG PO TABS: 4.0000 mg | ORAL_TABLET | Freq: Three times a day (TID) | ORAL | Status: DC | PRN

## 2014-09-27 ENCOUNTER — Encounter: Payer: Self-pay | Admitting: Women's Health

## 2014-10-01 ENCOUNTER — Emergency Department (HOSPITAL_COMMUNITY): Payer: 59

## 2014-10-01 ENCOUNTER — Encounter (HOSPITAL_COMMUNITY): Payer: Self-pay | Admitting: Emergency Medicine

## 2014-10-01 ENCOUNTER — Emergency Department (HOSPITAL_COMMUNITY)
Admission: EM | Admit: 2014-10-01 | Discharge: 2014-10-01 | Disposition: A | Discharge status: Home / Self Care | Payer: 59 | Attending: Emergency Medicine | Admitting: Emergency Medicine

## 2014-10-01 DIAGNOSIS — Y998 Other external cause status: Secondary | ICD-10-CM | POA: Diagnosis not present

## 2014-10-01 DIAGNOSIS — M25511 Pain in right shoulder: Secondary | ICD-10-CM

## 2014-10-01 DIAGNOSIS — J45909 Unspecified asthma, uncomplicated: Secondary | ICD-10-CM | POA: Diagnosis not present

## 2014-10-01 DIAGNOSIS — D571 Sickle-cell disease without crisis: Secondary | ICD-10-CM | POA: Diagnosis not present

## 2014-10-01 DIAGNOSIS — Y92481 Parking lot as the place of occurrence of the external cause: Secondary | ICD-10-CM | POA: Insufficient documentation

## 2014-10-01 DIAGNOSIS — Y9389 Activity, other specified: Secondary | ICD-10-CM | POA: Diagnosis not present

## 2014-10-01 DIAGNOSIS — Z791 Long term (current) use of non-steroidal anti-inflammatories (NSAID): Secondary | ICD-10-CM | POA: Insufficient documentation

## 2014-10-01 DIAGNOSIS — S4991XA Unspecified injury of right shoulder and upper arm, initial encounter: Secondary | ICD-10-CM | POA: Diagnosis present

## 2014-10-01 DIAGNOSIS — Z8739 Personal history of other diseases of the musculoskeletal system and connective tissue: Secondary | ICD-10-CM | POA: Insufficient documentation

## 2014-10-01 DIAGNOSIS — Z79899 Other long term (current) drug therapy: Secondary | ICD-10-CM | POA: Insufficient documentation

## 2014-10-01 DIAGNOSIS — S43401A Unspecified sprain of right shoulder joint, initial encounter: Secondary | ICD-10-CM | POA: Insufficient documentation

## 2014-10-01 DIAGNOSIS — Z8742 Personal history of other diseases of the female genital tract: Secondary | ICD-10-CM | POA: Diagnosis not present

## 2014-10-01 DIAGNOSIS — G43909 Migraine, unspecified, not intractable, without status migrainosus: Secondary | ICD-10-CM | POA: Diagnosis not present

## 2014-10-01 MED ORDER — IBUPROFEN 800 MG PO TABS: 800.0000 mg | ORAL_TABLET | Freq: Three times a day (TID) | ORAL | Status: DC
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
## 2014-10-01 NOTE — Discharge Instructions (Signed)
Acromioclavicular Injuries °The AC (acromioclavicular) joint is the joint in the shoulder where the collarbone (clavicle) meets the shoulder blade (scapula). The part of the shoulder blade connected to the collarbone is called the acromion. Common problems with and treatments for the AC joint are detailed below. °ARTHRITIS °Arthritis occurs when the joint has been injured and the smooth padding between the joints (cartilage) is lost. This is the wear and tear seen in most joints of the body if they have been overused. This causes the joint to produce pain and swelling which is worse with activity.  °AC JOINT SEPARATION °AC joint separation means that the ligaments connecting the acromion of the shoulder blade and collarbone have been damaged, and the two bones no longer line up. AC separations can be anywhere from mild to severe, and are "graded" depending upon which ligaments are torn and how badly they are torn. °· Grade I Injury: the least damage is done, and the AC joint still lines up. °· Grade II Injury: damage to the ligaments which reinforce the AC joint. In a Grade II injury, these ligaments are stretched but not entirely torn. When stressed, the AC joint becomes painful and unstable. °· Grade III Injury: AC and secondary ligaments are completely torn, and the collarbone is no longer attached to the shoulder blade. This results in deformity; a prominence of the end of the clavicle. °AC JOINT FRACTURE °AC joint fracture means that there has been a break in the bones of the AC joint, usually the end of the clavicle. °TREATMENT °TREATMENT OF AC ARTHRITIS °· There is currently no way to replace the cartilage damaged by arthritis. The best way to improve the condition is to decrease the activities which aggravate the problem. Application of ice to the joint helps decrease pain and soreness (inflammation). The use of non-steroidal anti-inflammatory medication is helpful. °· If less conservative measures do not  work, then cortisone shots (injections) may be used. These are anti-inflammatories; they decrease the soreness in the joint and swelling. °· If non-surgical measures fail, surgery may be recommended. The procedure is generally removal of a portion of the end of the clavicle. This is the part of the collarbone closest to your acromion which is stabilized with ligaments to the acromion of the shoulder blade. This surgery may be performed using a tube-like instrument with a light (arthroscope) for looking into a joint. It may also be performed as an open surgery through a small incision by the surgeon. Most patients will have good range of motion within 6 weeks and may return to all activity including sports by 8-12 weeks, barring complications. °TREATMENT OF AN AC SEPARATION °· The initial treatment is to decrease pain. This is best accomplished by immobilizing the arm in a sling and placing an ice pack to the shoulder for 20 to 30 minutes every 2 hours as needed. As the pain starts to subside, it is important to begin moving the fingers, wrist, elbow and eventually the shoulder in order to prevent a stiff or "frozen" shoulder. Instruction on when and how much to move the shoulder will be provided by your caregiver. The length of time needed to regain full motion and function depends on the amount or grade of the injury. Recovery from a Grade I AC separation usually takes 10 to 14 days, whereas a Grade III may take 6 to 8 weeks. °· Grade I and II separations usually do not require surgery. Even Grade III injuries usually allow return to full   activity with few restrictions. Treatment is also based on the activity demands of the injured shoulder. For example, a high level quarterback with an injured throwing arm will receive more aggressive treatment than someone with a desk job who rarely uses his/her arm for strenuous activities. In some cases, a painful lump may persist which could require a later surgery. Surgery  can be very successful, but the benefits must be weighed against the potential risks. °TREATMENT OF AN AC JOINT FRACTURE °Fracture treatment depends on the type of fracture. Sometimes a splint or sling may be all that is required. Other times surgery may be required for repair. This is more frequently the case when the ligaments supporting the clavicle are completely torn. Your caregiver will help you with these decisions and together you can decide what will be the best treatment. °HOME CARE INSTRUCTIONS  °· Apply ice to the injury for 15-20 minutes each hour while awake for 2 days. Put the ice in a plastic bag and place a towel between the bag of ice and skin. °· If a sling has been applied, wear it constantly for as long as directed by your caregiver, even at night. The sling or splint can be removed for bathing or showering or as directed. Be sure to keep the shoulder in the same place as when the sling is on. Do not lift the arm. °· If a figure-of-eight splint has been applied it should be tightened gently by another person every day. Tighten it enough to keep the shoulders held back. Allow enough room to place the index finger between the body and strap. Loosen the splint immediately if there is numbness or tingling in the hands. °· Take over-the-counter or prescription medicines for pain, discomfort or fever as directed by your caregiver. °· If you or your child has received a follow up appointment, it is very important to keep that appointment in order to avoid long term complications, chronic pain or disability. °SEEK MEDICAL CARE IF:  °· The pain is not relieved with medications. °· There is increased swelling or discoloration that continues to get worse rather than better. °· You or your child has been unable to follow up as instructed. °· There is progressive numbness and tingling in the arm, forearm or hand. °SEEK IMMEDIATE MEDICAL CARE IF:  °· The arm is numb, cold or pale. °· There is increasing pain  in the hand, forearm or fingers. °MAKE SURE YOU:  °· Understand these instructions. °· Will watch your condition. °· Will get help right away if you are not doing well or get worse. °Document Released: 02/20/2005 Document Revised: 08/05/2011 Document Reviewed: 08/15/2008 °ExitCare® Patient Information ©2015 [employer]. This information is not intended to replace advice given to you by your health care provider. Make sure you discuss any questions you have with your health care provider. ° °

## 2014-11-12 ENCOUNTER — Other Ambulatory Visit: Payer: Self-pay | Admitting: Family Medicine

## 2014-11-14 ENCOUNTER — Other Ambulatory Visit: Payer: 59

## 2014-11-14 DIAGNOSIS — E161 Other hypoglycemia: Secondary | ICD-10-CM

## 2014-11-14 DIAGNOSIS — R7309 Other abnormal glucose: Secondary | ICD-10-CM

## 2014-11-14 LAB — HEMOGLOBIN A1C
Hgb A1c MFr Bld: 5.8 % — ABNORMAL HIGH (ref <5.7)
MEAN PLASMA GLUCOSE: 120 mg/dL — AB (ref <117)

## 2014-11-15 ENCOUNTER — Telehealth: Payer: Self-pay | Admitting: *Deleted

## 2014-11-15 ENCOUNTER — Encounter: Payer: Self-pay | Admitting: Women's Health

## 2014-11-15 DIAGNOSIS — R7309 Other abnormal glucose: Secondary | ICD-10-CM

## 2014-11-15 LAB — INSULIN, FASTING: INSULIN FASTING, SERUM: 34.8 u[IU]/mL — AB (ref 2.0–19.6)

## 2014-11-15 NOTE — Telephone Encounter (Signed)
Per nancy okay to place referral for [facility] due to hemoglobin A1c. Referral placed

## 2014-11-21 ENCOUNTER — Other Ambulatory Visit: Payer: Self-pay | Admitting: Women's Health

## 2014-11-21 ENCOUNTER — Encounter: Payer: Self-pay | Admitting: Women's Health

## 2014-11-21 DIAGNOSIS — R109 Unspecified abdominal pain: Secondary | ICD-10-CM

## 2014-11-22 ENCOUNTER — Encounter: Payer: Self-pay | Admitting: Women's Health

## 2014-11-22 ENCOUNTER — Other Ambulatory Visit: Payer: Self-pay | Admitting: Women's Health

## 2014-11-22 DIAGNOSIS — R109 Unspecified abdominal pain: Secondary | ICD-10-CM

## 2014-11-22 MED ORDER — OXYCODONE-ACETAMINOPHEN 5-325 MG PO TABS: 1.0000 | ORAL_TABLET | Freq: Three times a day (TID) | ORAL | Status: DC | PRN

## 2014-11-22 NOTE — Progress Notes (Signed)
Continues to have menstrual cramping, email requesting pain medication will pick up prescription aware of addictive and uses sparingly.

## 2014-12-02 NOTE — Telephone Encounter (Signed)
Appointment 12/26/14 @ 8:00am

## 2014-12-20 ENCOUNTER — Encounter: Payer: Self-pay | Admitting: Women's Health

## 2014-12-26 ENCOUNTER — Ambulatory Visit: Payer: No Typology Code available for payment source | Admitting: Skilled Nursing Facility1

## 2015-01-06 ENCOUNTER — Encounter: Payer: Self-pay | Admitting: Women's Health

## 2015-02-13 ENCOUNTER — Encounter: Payer: Self-pay | Admitting: Women's Health

## 2015-02-14 ENCOUNTER — Other Ambulatory Visit: Payer: Self-pay | Admitting: Women's Health

## 2015-02-14 DIAGNOSIS — R109 Unspecified abdominal pain: Secondary | ICD-10-CM

## 2015-02-14 MED ORDER — OXYCODONE-ACETAMINOPHEN 5-325 MG PO TABS: 1.0000 | ORAL_TABLET | Freq: Three times a day (TID) | ORAL | Status: DC | PRN

## 2015-03-23 ENCOUNTER — Ambulatory Visit (INDEPENDENT_AMBULATORY_CARE_PROVIDER_SITE_OTHER)
Admission: RE | Admit: 2015-03-23 | Discharge: 2015-03-23 | Disposition: A | Discharge status: Home / Self Care | Payer: 59 | Source: Ambulatory Visit | Attending: Family | Admitting: Family

## 2015-03-23 ENCOUNTER — Ambulatory Visit (INDEPENDENT_AMBULATORY_CARE_PROVIDER_SITE_OTHER): Payer: 59 | Admitting: Family

## 2015-03-23 ENCOUNTER — Encounter: Payer: Self-pay | Admitting: Family

## 2015-03-23 ENCOUNTER — Other Ambulatory Visit (INDEPENDENT_AMBULATORY_CARE_PROVIDER_SITE_OTHER): Payer: 59

## 2015-03-23 VITALS — BP 122/82 | HR 98 | Temp 98.1°F | Ht 66.75 in | Wt 175.5 lb

## 2015-03-23 DIAGNOSIS — G43909 Migraine, unspecified, not intractable, without status migrainosus: Secondary | ICD-10-CM | POA: Insufficient documentation

## 2015-03-23 DIAGNOSIS — M545 Low back pain, unspecified: Secondary | ICD-10-CM

## 2015-03-23 DIAGNOSIS — R252 Cramp and spasm: Secondary | ICD-10-CM

## 2015-03-23 DIAGNOSIS — G43809 Other migraine, not intractable, without status migrainosus: Secondary | ICD-10-CM | POA: Diagnosis not present

## 2015-03-23 HISTORY — DX: Cramp and spasm: R25.2

## 2015-03-23 LAB — COMPREHENSIVE METABOLIC PANEL
ALT: 7 U/L (ref 0–35)
AST: 13 U/L (ref 0–37)
Albumin: 4.3 g/dL (ref 3.5–5.2)
Alkaline Phosphatase: 46 U/L (ref 39–117)
BUN: 13 mg/dL (ref 6–23)
CO2: 28 meq/L (ref 19–32)
Calcium: 9.8 mg/dL (ref 8.4–10.5)
Chloride: 104 mEq/L (ref 96–112)
Creatinine, Ser: 0.97 mg/dL (ref 0.40–1.20)
GFR: 87.06 mL/min (ref >60.00)
GLUCOSE: 105 mg/dL — AB (ref 70–99)
Potassium: 4.1 mEq/L (ref 3.5–5.1)
Sodium: 140 mEq/L (ref 135–145)
Total Bilirubin: 0.6 mg/dL (ref 0.2–1.2)
Total Protein: 7.3 g/dL (ref 6.0–8.3)

## 2015-03-23 LAB — MAGNESIUM: Magnesium: 2 mg/dL (ref 1.5–2.5)

## 2015-03-23 MED ORDER — SUMATRIPTAN SUCCINATE 100 MG PO TABS: ORAL_TABLET | ORAL | Status: DC

## 2015-03-23 MED ORDER — AMITRIPTYLINE HCL 25 MG PO TABS: 25.0000 mg | ORAL_TABLET | Freq: Every day | ORAL | Status: DC

## 2015-03-23 NOTE — Assessment & Plan Note (Signed)
Low back pain most likely related to lumbar dysfunction. Obtain x-rays to rule out structural abnormalities. Start home exercise therapy. Continue with ice/heat as needed and previously prescribed ibuprofen. Follow-up pending x-ray results and trial of home exercise therapy.

## 2015-03-23 NOTE — Assessment & Plan Note (Signed)
Muscle cramps of undetermined origin however cannot rule out back as source of muscle spasms. Obtain complete metabolic panel and magnesium to rule out electrolyte dysfunction. Stretch multiple times daily. Encouraged to drink plenty of non-caffeinated fluids. Follow-up pending lab work or for symptom worsening.

## 2015-03-23 NOTE — Assessment & Plan Note (Signed)
Migraines appears stable with current regimen of amitriptyline and Imitrex as needed for breakthrough headaches. Denies adverse side effects. Continue current dosage of amitriptyline and Imitrex. Follow-up for worsening of symptoms.

## 2015-03-23 NOTE — Progress Notes (Signed)
Subjective:    Patient ID: Penny Thomas, female    DOB: [birth year], 29 y.o.   MRN: [id number]  Chief Complaint  Patient presents with  . Establish Care    Migraines, muscle cramps and back pain    HPI:  Penny Thomas is a 29 y.o. 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 and presents today for an office visit to establish care.   1.) Migraines - Currently taking amitryptiline. Takes the medication as prescribed and denies adverse side effects. Frequency of headaches range from 4-12 on average. Headaches are described as throbbing with sensitivity to light and sound with associated nausea without vomiting. Modifying factors include Imitrex and dark room and rest.    2.) Muscle cramps - Associated symptom of muscle cramps located in her right calf described as a "charlie horse" has been going on for about 4 months waxing and waning.  Frequency of the leg cramps is about 3 times since it initially happened and her back has been ongoing. Describes soreness for 3 days following.   3.) Back pain - She also experiences back spasms that have slowly become more painful. Pain is primarily located in her lower back. Modifying factors include flexeril, naproxen, heat/ice, and sleeping with a pillow under her back and these do not seem to help. Denies any saddle anesthesia or changes to bowel/bladder. Denies any imaging. Denies trauma or sounds/sensations heard or felt.   No Known Allergies   Outpatient Prescriptions Prior to Visit  Medication Sig Dispense Refill  . albuterol (PROVENTIL HFA;VENTOLIN HFA) 108 (90 BASE) MCG/ACT inhaler Inhale 1-2 puffs into the  lungs every 6 (six) hours as needed for wheezing or shortness of breath. 1 Inhaler 0  . Biotin 1000 MCG tablet Take 2,000 mcg by mouth 3 (three) times daily.    . cyclobenzaprine (FLEXERIL) 10 MG tablet Take 1 tablet (10 mg total) by mouth 3 (three) times daily as needed for muscle spasms. 30 tablet 0  . ibuprofen (ADVIL,MOTRIN) 800 MG tablet Take 1 tablet (800 mg total) by mouth 3 (three) times daily. 21 tablet 0  . oxyCODONE-acetaminophen (ROXICET) 5-325 MG per tablet Take 1 tablet by mouth every 8 (eight) hours as needed for severe pain. 15 tablet 0  . amitriptyline (ELAVIL) 25 MG tablet Take 25 mg by mouth at bedtime.    . carisoprodol (SOMA) 350 MG tablet Take 1 tablet (350 mg total) by mouth 4 (four) times daily as needed for muscle spasms. 30 tablet 0  . Ferrous Sulfate (IRON) 28 MG TABS Take 27 mg by mouth daily.    . meloxicam (MOBIC) 15 MG tablet Take 1 tablet (15 mg total) by mouth daily. 30 tablet 0  . naproxen (NAPROSYN) 500 MG tablet Take 1 tablet (500 mg total) by mouth 2 (two) times daily with a meal. 30 tablet 0  . ondansetron (ZOFRAN) 4 MG tablet Take 1 tablet (4 mg total) by mouth every 8 (eight) hours as needed for nausea or vomiting. 20 tablet 0  . promethazine (PHENERGAN) 25 MG tablet Take 1 tablet (25 mg total) by mouth every 6 (six) hours as needed for nausea or vomiting. 30 tablet 0  . vitamin B-12 (CYANOCOBALAMIN) 1000 MCG tablet Take 1,000 mcg  by mouth daily.     No facility-administered medications prior to visit.     Past Medical History  Diagnosis Date  . Asthma   . Hemorrhage in uterus 01-20-09  . Sickle cell trait ([place])   . Blood transfusion 2007  . Blood transfusion 2010  . Seasonal allergies   . Allergy   . Blood transfusion without reported diagnosis   . Migraines   . Asthma     prn inhaler  . Anemia     takes iron supplement  . 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 Sickle cell anemia ([facility])     trait      Past Surgical History  Procedure Laterality Date  . Cesarean section  09-28-05  . Cesarean section  01-20-09  . Polyp removal  5-08    ?CERVICAL OR UTERINE POLYP  . Tubal ligation  01-20-09    DONE WITH C-SECTION  . Endometrial ablation  10/2011    HEROPTIOIN  . Wisdom tooth extraction Right 2014  . Cesarean section    . Knee arthroscopy Right 02/16/2014    Procedure: RIGHT ARTHROSCOPY KNEE;  Surgeon: Kerin Salen, MD;  Location: [facility];  Service: Orthopedics;  Laterality: Right;     Family History  Problem Relation Age of Onset  . Endometriosis Sister   . Fibromyalgia Mother   . Multiple sclerosis Mother   . Asthma Father   . Healthy Maternal Grandmother   . Healthy Maternal Grandfather   . Healthy Paternal Grandmother   . Healthy Paternal Grandfather      Social History   Social History  . Marital Status: Married    Spouse Name: N/A  . Number of Children: 4  . Years of Education: 14   Occupational History  . [employer]    Social History Main Topics  . Smoking status: Never Smoker   . Smokeless tobacco: Never Used  . Alcohol Use: No     Comment: seldom  . Drug Use: No  . Sexual Activity: Yes    Birth Control/ Protection: Surgical     Comment: TUBAL LIGATION, INTERCOURSE AGE 81, SEXUAL PARTNERS 5   Other Topics Concern  . Not on file   Social History Narrative   Fun: Baking, playing with the children.   Denies abuse and feels safe at home.          Review of Systems  Constitutional: Negative for fever and chills.  Musculoskeletal: Positive for back pain.  Neurological: Positive for headaches. Negative for weakness and numbness.      Objective:    BP 122/82 mmHg  Pulse 98  Temp(Src) 98.1 F (36.7 C) (Oral)  Ht 5' 6.75" (1.695 m)  Wt 175 lb 8 oz (79.606 kg)  BMI 27.71 kg/m2  SpO2 98%  LMP 03/08/2015 Nursing note and vital signs reviewed.  Physical Exam  Constitutional: She is oriented to person, place, and time.  She appears well-developed and well-nourished. No distress.  Cardiovascular: Normal rate, regular rhythm, normal heart sounds and intact distal pulses.   Pulmonary/Chest: Effort normal and breath sounds normal.  Musculoskeletal:  Right calf - no obvious deformity, discoloration, or edema noted. No palpable tenderness. Ankle range of motion within normal limits and no pain. Negative Homans sign. Distal pulses are intact and appropriate.  Lumbar spine - no obvious deformity, discoloration, or edema noted. Palpable tenderness along right paraspinal musculature. No radiculopathy able to be elicited. Range of motion  is normal in all directions with discomfort felt with extension and rotation. Straight leg raises negative. Distal pulses, sensation, and reflexes are intact and appropriate.  Neurological: She is alert and oriented to person, place, and time.  Skin: Skin is warm and dry.  Psychiatric: She has a normal mood and affect. Her behavior is normal. Judgment and thought content normal.       Assessment & Plan:   Problem List Items Addressed This Visit      Cardiovascular and Mediastinum   Migraines    Migraines appears stable with current regimen of amitriptyline and Imitrex as needed for breakthrough headaches. Denies adverse side effects. Continue current dosage of amitriptyline and Imitrex. Follow-up for worsening of symptoms.      Relevant Medications   amitriptyline (ELAVIL) 25 MG tablet   SUMAtriptan (IMITREX) 100 MG tablet     Other   Right-sided low back pain without sciatica - Primary    Low back pain most likely related to lumbar dysfunction. Obtain x-rays to rule out structural abnormalities. Start home exercise therapy. Continue with ice/heat as needed and previously prescribed ibuprofen. Follow-up pending x-ray results and trial of home exercise therapy.      Relevant Orders   DG Lumbar Spine Complete   Muscle cramps    Muscle cramps of undetermined origin however  cannot rule out back as source of muscle spasms. Obtain complete metabolic panel and magnesium to rule out electrolyte dysfunction. Stretch multiple times daily. Encouraged to drink plenty of non-caffeinated fluids. Follow-up pending lab work or for symptom worsening.      Relevant Orders   Comp Met (CMET)   Magnesium

## 2015-03-23 NOTE — Patient Instructions (Signed)
Thank you for choosing [facility].  Summary/Instructions:  Your prescription(s) have been submitted to your pharmacy or been printed and provided for you. Please take as directed and contact our office if you believe you are having problem(s) with the medication(s) or have any questions.  Please stop by the lab on the basement level of the building for your blood work. Your results will be released to [facility] (or called to you) after review, usually within 72 hours after test completion. If any changes need to be made, you will be notified at that same time.  Please stop by radiology on the basement level of the building for your x-rays. Your results will be released to [facility] (or called to you) after review, usually within 72 hours after test completion. If any treatments or changes are necessary, you will be notified at that same time.  Referrals have been made during this visit. You should expect to hear back from our schedulers in about 7-10 days in regards to establishing an appointment with the specialists we discussed.   If your symptoms worsen or fail to improve, please contact our office for further instruction, or in case of emergency go directly to the emergency room at the closest medical facility.    Low Back Strain With Rehab A strain is an injury in which a tendon or muscle is torn. The muscles and tendons of the lower back are vulnerable to strains. However, these muscles and tendons are very strong and require a great force to be injured. Strains are classified into three categories. Grade 1 strains cause pain, but the tendon is not lengthened. Grade 2 strains include a lengthened ligament, due to the ligament being stretched or partially ruptured. With grade 2 strains there is still function, although the function may be decreased. Grade 3 strains involve a complete tear of the tendon or muscle, and function is usually impaired. SYMPTOMS   Pain in the lower back.  Pain  that affects one side more than the other.  Pain that gets worse with movement and may be felt in the hip, buttocks, or back of the thigh.  Muscle spasms of the muscles in the back.  Swelling along the muscles of the back.  Loss of strength of the back muscles.  Crackling sound (crepitation) when the muscles are touched. CAUSES  Lower back strains occur when a force is placed on the muscles or tendons that is greater than they can handle. Common causes of injury include:  Prolonged overuse of the muscle-tendon units in the lower back, usually from incorrect posture.  A single violent injury or force applied to the back. RISK INCREASES WITH:  Sports that involve twisting forces on the spine or a lot of bending at the waist (football, rugby, weightlifting, bowling, golf, tennis, speed skating, racquetball, swimming, running, gymnastics, diving).  Poor strength and flexibility.  Failure to warm up properly before activity.  Family history of lower back pain or disk disorders.  Previous back injury or surgery (especially fusion).  Poor posture with lifting, especially heavy objects.  Prolonged sitting, especially with poor posture. PREVENTION   Learn and use proper posture when sitting or lifting (maintain proper posture when sitting, lift using the knees and legs, not at the waist).  Warm up and stretch properly before activity.  Allow for adequate recovery between workouts.  Maintain physical fitness:  Strength, flexibility, and endurance.  Cardiovascular fitness. PROGNOSIS  If treated properly, lower back strains usually heal within 6 weeks. RELATED  COMPLICATIONS   Recurring symptoms, resulting in a chronic problem.  Chronic inflammation, scarring, and partial muscle-tendon tear.  Delayed healing or resolution of symptoms.  Prolonged disability. TREATMENT  Treatment first involves the use of ice and medicine, to reduce pain and inflammation. The use of  strengthening and stretching exercises may help reduce pain with activity. These exercises may be performed at home or with a therapist. Severe injuries may require referral to a therapist for further evaluation and treatment, such as ultrasound. Your caregiver may advise that you wear a back brace or corset, to help reduce pain and discomfort. Often, prolonged bed rest results in greater harm then benefit. Corticosteroid injections may be recommended. However, these should be reserved for the most serious cases. It is important to avoid using your back when lifting objects. At night, sleep on your back on a firm mattress with a pillow placed under your knees. If non-surgical treatment is unsuccessful, surgery may be needed.  MEDICATION   If pain medicine is needed, nonsteroidal anti-inflammatory medicines (aspirin and ibuprofen), or other minor pain relievers (acetaminophen), are often advised.  Do not take pain medicine for 7 days before surgery.  Prescription pain relievers may be given, if your caregiver thinks they are needed. Use only as directed and only as much as you need.  Ointments applied to the skin may be helpful.  Corticosteroid injections may be given by your caregiver. These injections should be reserved for the most serious cases, because they may only be given a certain number of times. HEAT AND COLD  Cold treatment (icing) should be applied for 10 to 15 minutes every 2 to 3 hours for inflammation and pain, and immediately after activity that aggravates your symptoms. Use ice packs or an ice massage.  Heat treatment may be used before performing stretching and strengthening activities prescribed by your caregiver, physical therapist, or athletic trainer. Use a heat pack or a warm water soak. SEEK MEDICAL CARE IF:   Symptoms get worse or do not improve in 2 to 4 weeks, despite treatment.  You develop numbness, weakness, or loss of bowel or bladder function.  New, unexplained  symptoms develop. (Drugs used in treatment may produce side effects.) EXERCISES  RANGE OF MOTION (ROM) AND STRETCHING EXERCISES - Low Back Strain Most people with lower back pain will find that their symptoms get worse with excessive bending forward (flexion) or arching at the lower back (extension). The exercises which will help resolve your symptoms will focus on the opposite motion.  Your physician, physical therapist or athletic trainer will help you determine which exercises will be most helpful to resolve your lower back pain. Do not complete any exercises without first consulting with your caregiver. Discontinue any exercises which make your symptoms worse until you speak to your caregiver.  If you have pain, numbness or tingling which travels down into your buttocks, leg or foot, the goal of the therapy is for these symptoms to move closer to your back and eventually resolve. Sometimes, these leg symptoms will get better, but your lower back pain may worsen. This is typically an indication of progress in your rehabilitation. Be very alert to any changes in your symptoms and the activities in which you participated in the 24 hours prior to the change. Sharing this information with your caregiver will allow him/her to most efficiently treat your condition.  These exercises may help you when beginning to rehabilitate your injury. Your symptoms may resolve with or without further involvement from  your physician, physical therapist or athletic trainer. While completing these exercises, remember:  Restoring tissue flexibility helps normal motion to return to the joints. This allows healthier, less painful movement and activity.  An effective stretch should be held for at least 30 seconds.  A stretch should never be painful. You should only feel a gentle lengthening or release in the stretched tissue. FLEXION RANGE OF MOTION AND STRETCHING EXERCISES: STRETCH - Flexion, Single Knee to Chest   Lie on  a firm bed or floor with both legs extended in front of you.  Keeping one leg in contact with the floor, bring your opposite knee to your chest. Hold your leg in place by either grabbing behind your thigh or at your knee.  Pull until you feel a gentle stretch in your lower back. Hold __________ seconds.  Slowly release your grasp and repeat the exercise with the opposite side. Repeat __________ times. Complete this exercise __________ times per day.  STRETCH - Flexion, Double Knee to Chest   Lie on a firm bed or floor with both legs extended in front of you.  Keeping one leg in contact with the floor, bring your opposite knee to your chest.  Tense your stomach muscles to support your back and then lift your other knee to your chest. Hold your legs in place by either grabbing behind your thighs or at your knees.  Pull both knees toward your chest until you feel a gentle stretch in your lower back. Hold __________ seconds.  Tense your stomach muscles and slowly return one leg at a time to the floor. Repeat __________ times. Complete this exercise __________ times per day.  STRETCH - Low Trunk Rotation  Lie on a firm bed or floor. Keeping your legs in front of you, bend your knees so they are both pointed toward the ceiling and your feet are flat on the floor.  Extend your arms out to the side. This will stabilize your upper body by keeping your shoulders in contact with the floor.  Gently and slowly drop both knees together to one side until you feel a gentle stretch in your lower back. Hold for __________ seconds.  Tense your stomach muscles to support your lower back as you bring your knees back to the starting position. Repeat the exercise to the other side. Repeat __________ times. Complete this exercise __________ times per day  EXTENSION RANGE OF MOTION AND FLEXIBILITY EXERCISES: STRETCH - Extension, Prone on Elbows   Lie on your stomach on the floor, a bed will be too soft.  Place your palms about shoulder width apart and at the height of your head.  Place your elbows under your shoulders. If this is too painful, stack pillows under your chest.  Allow your body to relax so that your hips drop lower and make contact more completely with the floor.  Hold this position for __________ seconds.  Slowly return to lying flat on the floor. Repeat __________ times. Complete this exercise __________ times per day.  RANGE OF MOTION - Extension, Prone Press Ups  Lie on your stomach on the floor, a bed will be too soft. Place your palms about shoulder width apart and at the height of your head.  Keeping your back as relaxed as possible, slowly straighten your elbows while keeping your hips on the floor. You may adjust the placement of your hands to maximize your comfort. As you gain motion, your hands will come more underneath your shoulders.  Hold this  position __________ seconds.  Slowly return to lying flat on the floor. Repeat __________ times. Complete this exercise __________ times per day.  RANGE OF MOTION- Quadruped, Neutral Spine   Assume a hands and knees position on a firm surface. Keep your hands under your shoulders and your knees under your hips. You may place padding under your knees for comfort.  Drop your head and point your tail bone toward the ground below you. This will round out your lower back like an angry cat. Hold this position for __________ seconds.  Slowly lift your head and release your tail bone so that your back sags into a large arch, like an old horse.  Hold this position for __________ seconds.  Repeat this until you feel limber in your lower back.  Now, find your "sweet spot." This will be the most comfortable position somewhere between the two previous positions. This is your neutral spine. Once you have found this position, tense your stomach muscles to support your lower back.  Hold this position for __________ seconds. Repeat  __________ times. Complete this exercise __________ times per day.  STRENGTHENING EXERCISES - Low Back Strain These exercises may help you when beginning to rehabilitate your injury. These exercises should be done near your "sweet spot." This is the neutral, low-back arch, somewhere between fully rounded and fully arched, that is your least painful position. When performed in this safe range of motion, these exercises can be used for people who have either a flexion or extension based injury. These exercises may resolve your symptoms with or without further involvement from your physician, physical therapist or athletic trainer. While completing these exercises, remember:   Muscles can gain both the endurance and the strength needed for everyday activities through controlled exercises.  Complete these exercises as instructed by your physician, physical therapist or athletic trainer. Increase the resistance and repetitions only as guided.  You may experience muscle soreness or fatigue, but the pain or discomfort you are trying to eliminate should never worsen during these exercises. If this pain does worsen, stop and make certain you are following the directions exactly. If the pain is still present after adjustments, discontinue the exercise until you can discuss the trouble with your caregiver. STRENGTHENING - Deep Abdominals, Pelvic Tilt  Lie on a firm bed or floor. Keeping your legs in front of you, bend your knees so they are both pointed toward the ceiling and your feet are flat on the floor.  Tense your lower abdominal muscles to press your lower back into the floor. This motion will rotate your pelvis so that your tail bone is scooping upwards rather than pointing at your feet or into the floor.  With a gentle tension and even breathing, hold this position for __________ seconds. Repeat __________ times. Complete this exercise __________ times per day.  STRENGTHENING - Abdominals, Crunches     Lie on a firm bed or floor. Keeping your legs in front of you, bend your knees so they are both pointed toward the ceiling and your feet are flat on the floor. Cross your arms over your chest.  Slightly tip your chin down without bending your neck.  Tense your abdominals and slowly lift your trunk high enough to just clear your shoulder blades. Lifting higher can put excessive stress on the lower back and does not further strengthen your abdominal muscles.  Control your return to the starting position. Repeat __________ times. Complete this exercise __________ times per day.  STRENGTHENING -  Quadruped, Opposite UE/LE Lift   Assume a hands and knees position on a firm surface. Keep your hands under your shoulders and your knees under your hips. You may place padding under your knees for comfort.  Find your neutral spine and gently tense your abdominal muscles so that you can maintain this position. Your shoulders and hips should form a rectangle that is parallel with the floor and is not twisted.  Keeping your trunk steady, lift your right hand no higher than your shoulder and then your left leg no higher than your hip. Make sure you are not holding your breath. Hold this position __________ seconds.  Continuing to keep your abdominal muscles tense and your back steady, slowly return to your starting position. Repeat with the opposite arm and leg. Repeat __________ times. Complete this exercise __________ times per day.  STRENGTHENING - Lower Abdominals, Double Knee Lift  Lie on a firm bed or floor. Keeping your legs in front of you, bend your knees so they are both pointed toward the ceiling and your feet are flat on the floor.  Tense your abdominal muscles to brace your lower back and slowly lift both of your knees until they come over your hips. Be certain not to hold your breath.  Hold __________ seconds. Using your abdominal muscles, return to the starting position in a slow and  controlled manner. Repeat __________ times. Complete this exercise __________ times per day.  POSTURE AND BODY MECHANICS CONSIDERATIONS - Low Back Strain Keeping correct posture when sitting, standing or completing your activities will reduce the stress put on different body tissues, allowing injured tissues a chance to heal and limiting painful experiences. The following are general guidelines for improved posture. Your physician or physical therapist will provide you with any instructions specific to your needs. While reading these guidelines, remember:  The exercises prescribed by your provider will help you have the flexibility and strength to maintain correct postures.  The correct posture provides the best environment for your joints to work. All of your joints have less wear and tear when properly supported by a spine with good posture. This means you will experience a healthier, less painful body.  Correct posture must be practiced with all of your activities, especially prolonged sitting and standing. Correct posture is as important when doing repetitive low-stress activities (typing) as it is when doing a single heavy-load activity (lifting). RESTING POSITIONS Consider which positions are most painful for you when choosing a resting position. If you have pain with flexion-based activities (sitting, bending, stooping, squatting), choose a position that allows you to rest in a less flexed posture. You would want to avoid curling into a fetal position on your side. If your pain worsens with extension-based activities (prolonged standing, working overhead), avoid resting in an extended position such as sleeping on your stomach. Most people will find more comfort when they rest with their spine in a more neutral position, neither too rounded nor too arched. Lying on a non-sagging bed on your side with a pillow between your knees, or on your back with a pillow under your knees will often provide some  relief. Keep in mind, being in any one position for a prolonged period of time, no matter how correct your posture, can still lead to stiffness. PROPER SITTING POSTURE In order to minimize stress and discomfort on your spine, you must sit with correct posture. Sitting with good posture should be effortless for a healthy body. Returning to good posture is  a gradual process. Many people can work toward this most comfortably by using various supports until they have the flexibility and strength to maintain this posture on their own. When sitting with proper posture, your ears will fall over your shoulders and your shoulders will fall over your hips. You should use the back of the chair to support your upper back. Your lower back will be in a neutral position, just slightly arched. You may place a small pillow or folded towel at the base of your lower back for support.  When working at a desk, create an environment that supports good, upright posture. Without extra support, muscles tire, which leads to excessive strain on joints and other tissues. Keep these recommendations in mind: CHAIR:  A chair should be able to slide under your desk when your back makes contact with the back of the chair. This allows you to work closely.  The chair's height should allow your eyes to be level with the upper part of your monitor and your hands to be slightly lower than your elbows. BODY POSITION  Your feet should make contact with the floor. If this is not possible, use a foot rest.  Keep your ears over your shoulders. This will reduce stress on your neck and lower back. INCORRECT SITTING POSTURES  If you are feeling tired and unable to assume a healthy sitting posture, do not slouch or slump. This puts excessive strain on your back tissues, causing more damage and pain. Healthier options include:  Using more support, like a lumbar pillow.  Switching tasks to something that requires you to be upright or  walking.  Talking a brief walk.  Lying down to rest in a neutral-spine position. PROLONGED STANDING WHILE SLIGHTLY LEANING FORWARD  When completing a task that requires you to lean forward while standing in one place for a long time, place either foot up on a stationary 2-4 inch high object to help maintain the best posture. When both feet are on the ground, the lower back tends to lose its slight inward curve. If this curve flattens (or becomes too large), then the back and your other joints will experience too much stress, tire more quickly, and can cause pain. CORRECT STANDING POSTURES Proper standing posture should be assumed with all daily activities, even if they only take a few moments, like when brushing your teeth. As in sitting, your ears should fall over your shoulders and your shoulders should fall over your hips. You should keep a slight tension in your abdominal muscles to brace your spine. Your tailbone should point down to the ground, not behind your body, resulting in an over-extended swayback posture.  INCORRECT STANDING POSTURES  Common incorrect standing postures include a forward head, locked knees and/or an excessive swayback. WALKING Walk with an upright posture. Your ears, shoulders and hips should all line-up. PROLONGED ACTIVITY IN A FLEXED POSITION When completing a task that requires you to bend forward at your waist or lean over a low surface, try to find a way to stabilize 3 out of 4 of your limbs. You can place a hand or elbow on your thigh or rest a knee on the surface you are reaching across. This will provide you more stability so that your muscles do not fatigue as quickly. By keeping your knees relaxed, or slightly bent, you will also reduce stress across your lower back. CORRECT LIFTING TECHNIQUES DO :   Assume a wide stance. This will provide you more stability and  the opportunity to get as close as possible to the object which you are lifting.  Tense your  abdominals to brace your spine. Bend at the knees and hips. Keeping your back locked in a neutral-spine position, lift using your leg muscles. Lift with your legs, keeping your back straight.  Test the weight of unknown objects before attempting to lift them.  Try to keep your elbows locked down at your sides in order get the best strength from your shoulders when carrying an object.  Always ask for help when lifting heavy or awkward objects. INCORRECT LIFTING TECHNIQUES DO NOT:   Lock your knees when lifting, even if it is a small object.  Bend and twist. Pivot at your feet or move your feet when needing to change directions.  Assume that you can safely pick up even a paper clip without proper posture.   This information is not intended to replace advice given to you by your health care provider. Make sure you discuss any questions you have with your health care provider.   Document Released: 05/13/2005 Document Revised: 06/03/2014 Document Reviewed: 08/25/2008 Elsevier Interactive Patient Education [employer].

## 2015-03-29 ENCOUNTER — Encounter: Payer: Self-pay | Admitting: Family

## 2015-03-31 ENCOUNTER — Other Ambulatory Visit: Payer: Self-pay | Admitting: Women's Health

## 2015-03-31 ENCOUNTER — Telehealth: Payer: Self-pay | Admitting: *Deleted

## 2015-03-31 DIAGNOSIS — R109 Unspecified abdominal pain: Secondary | ICD-10-CM

## 2015-03-31 DIAGNOSIS — N946 Dysmenorrhea, unspecified: Secondary | ICD-10-CM

## 2015-03-31 MED ORDER — OXYCODONE-ACETAMINOPHEN 5-325 MG PO TABS
1.0000 | ORAL_TABLET | Freq: Three times a day (TID) | ORAL | Status: DC | PRN
Start: 2015-03-31 — End: 2015-05-24

## 2015-03-31 NOTE — Telephone Encounter (Signed)
Pt called requesting Rx for hydrocodone or oxycodone for menstrual cramps, cycle started today. Was given Roxicet 5-325 in Sept. Please advise

## 2015-03-31 NOTE — Telephone Encounter (Signed)
Telephone call, will pick up Rx #[id number] no refills

## 2015-04-17 ENCOUNTER — Other Ambulatory Visit: Payer: Self-pay | Admitting: Family

## 2015-04-17 NOTE — Telephone Encounter (Signed)
Last refill was 10/27

## 2015-05-23 ENCOUNTER — Other Ambulatory Visit: Payer: Self-pay | Admitting: Physician Assistant

## 2015-05-23 ENCOUNTER — Other Ambulatory Visit: Payer: Self-pay | Admitting: Women's Health

## 2015-05-23 ENCOUNTER — Encounter: Payer: Self-pay | Admitting: Women's Health

## 2015-05-23 ENCOUNTER — Encounter: Payer: Self-pay | Admitting: Family

## 2015-05-23 DIAGNOSIS — M5440 Lumbago with sciatica, unspecified side: Secondary | ICD-10-CM

## 2015-05-23 DIAGNOSIS — R109 Unspecified abdominal pain: Secondary | ICD-10-CM

## 2015-05-23 DIAGNOSIS — N946 Dysmenorrhea, unspecified: Secondary | ICD-10-CM

## 2015-05-24 MED ORDER — OXYCODONE-ACETAMINOPHEN 5-325 MG PO TABS: 1.0000 | ORAL_TABLET | Freq: Three times a day (TID) | ORAL | Status: DC | PRN

## 2015-05-24 NOTE — Telephone Encounter (Signed)
Rx refused.  Pt need to contact her PCP.//AB/CMA

## 2015-05-24 NOTE — Telephone Encounter (Signed)
[facility] for #15 of Roxicet.

## 2015-05-28 ENCOUNTER — Encounter: Payer: Self-pay | Admitting: Women's Health

## 2015-06-01 MED FILL — SUMATRIPTAN SUCC 100 MG TAB: 100 | 30 days supply | Qty: 9 | Fill #0

## 2015-06-12 ENCOUNTER — Other Ambulatory Visit: Payer: Self-pay | Admitting: Family

## 2015-06-14 MED FILL — AMITRIPTYLINE HCL 25 MG TAB: 25 | 90 days supply | Qty: 90 | Fill #0

## 2015-06-23 ENCOUNTER — Encounter: Payer: Self-pay | Admitting: Neurology

## 2015-06-23 ENCOUNTER — Ambulatory Visit (INDEPENDENT_AMBULATORY_CARE_PROVIDER_SITE_OTHER): Payer: 59 | Admitting: Neurology

## 2015-06-23 VITALS — BP 118/74 | HR 92 | Ht 67.0 in | Wt 183.0 lb

## 2015-06-23 DIAGNOSIS — G43719 Chronic migraine without aura, intractable, without status migrainosus: Secondary | ICD-10-CM | POA: Diagnosis not present

## 2015-06-23 MED ORDER — MAGNESIUM CITRATE 125 MG PO CAPS: 365.0000 mg | ORAL_CAPSULE | Freq: Every day | ORAL | Status: DC

## 2015-06-23 MED ORDER — RIZATRIPTAN BENZOATE 10 MG PO TBDP: ORAL_TABLET | ORAL | Status: DC

## 2015-06-23 MED ORDER — AMITRIPTYLINE HCL 50 MG PO TABS: 50.0000 mg | ORAL_TABLET | Freq: Every day | ORAL | Status: DC

## 2015-06-23 MED FILL — RIZATRIPTAN 10 MG ODT: 10 | 30 days supply | Qty: 9 | Fill #0

## 2015-06-23 NOTE — Progress Notes (Signed)
NEUROLOGY CONSULTATION NOTE  Penny Thomas MRN: [id number] DOB: 30-Mar-1986  Referring provider: Mauricio Po, FNP Primary care provider: Mauricio Po, FNP  Reason for consult:  headache  HISTORY OF PRESENT ILLNESS: Penny Thomas is a 30 year old right-handed female who presents for headaches.  History obtained by patient and PCP note.  Labs reviewed.   Onset:  30 years old Location:  Varies (bi-frontal, back of head) Quality:  Squeezing/"feels like I am hit in the head with a hammer" Intensity:  9-10/10 Aura:  no Prodrome:  no Associated symptoms:  Nausea, dizziness, photophobia, osmophobia Duration:  1 to 3 days Frequency:  3 to 4 days per week (16 headache days per week) Triggers/exacerbating factors:  Menstrual cycle, caffeine, scents (perfume, deoderant, hand sanitizer) Relieving factors:  Laying down Activity:  Lays down if severe  Past NSAIDS:  naproxen 500mg , Mobic Past analgesics:  Hydrocodone-acetaminophen, tramadol 50mg  Past abortive triptans:  none Past muscle relaxants:  Flexeril Past anti-nausea:  Zofran 4mg , promethazine 25mg  Past antihypertensive medications:  no Past antidepressant medications:  no Past anticonvulsant medications:  no Past vitamins/Herbal/Supplements:  no Past antihistamines/decongestants:  no Other past medications:  no  Current NSAIDS:  ibuprofen 800mg  Current analgesics:  Roxicet Current triptans:  sumatriptan 100mg  Current anti-nausea:  none Current muscle relaxants:  none Antihypertensive medications:  none Antidepressant medications:  amitriptyline 25mg  Anticonvulsant medications:  none Vitamins/Herbal/Supplements:  Biotin Antihistamines/Decongestants:  none Other therapy:  none  Caffeine:  no Alcohol:  seldom Smoker:  no Diet:  Good, water Exercise:  Not routine Depression/stress:  no Sleep hygiene:  Usually good Family history of headache:  Mom, sister, grandmother  CMP from October was  unremarkable.  PAST MEDICAL HISTORY: Past Medical History  Diagnosis Date  . Asthma   . Hemorrhage in uterus 01-20-09  . Sickle cell trait ([facility])   . Blood transfusion 2007  . Blood transfusion 2010  . Seasonal allergies   . Allergy   . Blood transfusion without reported diagnosis   . Migraines   . Asthma     prn inhaler  . Anemia     takes iron supplement  . 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 Sickle cell anemia ([facility])     trait    PAST SURGICAL HISTORY: Past Surgical History  Procedure Laterality Date  . Cesarean section  09-28-05  . Cesarean section  01-20-09  . Polyp removal  5-08    ?CERVICAL OR UTERINE POLYP  . Tubal ligation  01-20-09    DONE WITH C-SECTION  . Endometrial ablation  10/2011    HEROPTIOIN  . Wisdom tooth extraction Right 2014  . Cesarean section    . Knee arthroscopy Right 02/16/2014    Procedure: RIGHT ARTHROSCOPY KNEE;  Surgeon: Kerin Salen, MD;  Location: [facility];  Service: Orthopedics;  Laterality: Right;    MEDICATIONS: Current Outpatient Prescriptions on File Prior to Visit  Medication Sig Dispense Refill  . albuterol (PROVENTIL HFA;VENTOLIN HFA) 108 (90 BASE) MCG/ACT inhaler Inhale 1-2 puffs into the lungs every 6 (six) hours as needed for wheezing or shortness of breath. 1 Inhaler 0  . Biotin 1000 MCG tablet Take 2,000 mcg by mouth 3 (three) times daily.    . cyclobenzaprine (FLEXERIL) 10 MG tablet Take 1 tablet (10 mg total) by mouth 3 (three) times daily as needed for muscle spasms. 30 tablet 0  . ibuprofen (ADVIL,MOTRIN) 800 MG tablet Take 1 tablet (800  mg total) by mouth 3 (three) times daily. 21 tablet 0  . oxyCODONE-acetaminophen (ROXICET) 5-325 MG tablet Take 1 tablet by mouth every 8 (eight) hours as needed for severe pain. 15 tablet 0   No current facility-administered medications on file prior to visit.    ALLERGIES: No Known Allergies  FAMILY HISTORY: Family History    Problem Relation Age of Onset  . Endometriosis Sister   . Fibromyalgia Mother   . Multiple sclerosis Mother   . Asthma Father   . Healthy Maternal Grandmother   . Healthy Maternal Grandfather   . Healthy Paternal Grandmother   . Healthy Paternal Grandfather     SOCIAL HISTORY: Social History   Social History  . Marital Status: Married    Spouse Name: N/A  . Number of Children: 4  . Years of Education: 14   Occupational History  . [employer]    Social History Main Topics  . Smoking status: Never Smoker   . Smokeless tobacco: Never Used  . Alcohol Use: No     Comment: seldom  . Drug Use: No  . Sexual Activity: Yes    Birth Control/ Protection: Surgical     Comment: TUBAL LIGATION, INTERCOURSE AGE 31, SEXUAL PARTNERS 5   Other Topics Concern  . Not on file   Social History Narrative   Fun: Baking, playing with the children.   Denies abuse and feels safe at home.         REVIEW OF SYSTEMS: Constitutional: No fevers, chills, or sweats, no generalized fatigue, change in appetite Eyes: No visual changes, double vision, eye pain Ear, nose and throat: No hearing loss, ear pain, nasal congestion, sore throat Cardiovascular: No chest pain, palpitations Respiratory:  No shortness of breath at rest or with exertion, wheezes GastrointestinaI: No nausea, vomiting, diarrhea, abdominal pain, fecal incontinence Genitourinary:  No dysuria, urinary retention or frequency Musculoskeletal:  No neck pain, back pain Integumentary: No rash, pruritus, skin lesions Neurological: as above Psychiatric: No depression, insomnia, anxiety Endocrine: No palpitations, fatigue, diaphoresis, mood swings, change in appetite, change in weight, increased thirst Hematologic/Lymphatic:  No anemia, purpura, petechiae. Allergic/Immunologic: no itchy/runny eyes, nasal congestion, recent allergic reactions, rashes  PHYSICAL EXAM: Filed Vitals:   06/23/15 0820  BP: 118/74  Pulse: 92   General:  No acute distress.  Patient appears well-groomed.  Head:  Normocephalic/atraumatic Eyes:  fundi unremarkable, without vessel changes, exudates, hemorrhages or papilledema. Neck: supple, no paraspinal tenderness, full range of motion Back: No paraspinal tenderness Heart: regular rate and rhythm Lungs: Clear to auscultation bilaterally. Vascular: No carotid bruits. Neurological Exam: Mental status: alert and oriented to person, place, and time, recent and remote memory intact, fund of knowledge intact, attention and concentration intact, speech fluent and not dysarthric, language intact. Cranial nerves: CN I: not tested CN II: pupils equal, round and reactive to light, visual fields intact, fundi unremarkable, without vessel changes, exudates, hemorrhages or papilledema. CN III, IV, VI:  full range of motion, no nystagmus, no ptosis CN V: facial sensation intact CN VII: upper and lower face symmetric CN VIII: hearing intact CN IX, X: gag intact, uvula midline CN XI: sternocleidomastoid and trapezius muscles intact CN XII: tongue midline Bulk & Tone: normal, no fasciculations. Motor:  5/5 throughout  Sensation: temperature and vibration sensation intact. Deep Tendon Reflexes:  2+ throughout, toes downgoing.  Finger to nose testing:  Without dysmetria.  Heel to shin:  Without dysmetria.  Gait:  Normal station and stride.  Able to turn  and tandem walk. Romberg negative.  IMPRESSION: Chronic migraines without aura, intractable  PLAN: 1.  Increase amitriptyline to 50mg  at bedtime and she is to call in 4 weeks with update 2.  Maxalt 10mg  for abortive therapy 3.  Lifestyle modification:  Diet, exercise 4.  Follow up in approximately 3-5 months  Thank you for allowing me to take part in the care of this patient.  Metta Clines, DO  CC:  Mauricio Po, FNP

## 2015-06-23 NOTE — Patient Instructions (Signed)
Migraine Recommendations: 1.  Increase amitriptyline to 50mg  at bedtime.  Call in 4 weeks with update and we can adjust dose if needed. 2.  Stop sumatriptan. Take Maxalt (rizatriptan) at earliest onset of headache.  May repeat dose once in 2 hours if needed.  Do not exceed two tablets in 24 hours. 3.  Limit use of pain relievers to no more than 2 days out of the week.  These medications include acetaminophen, ibuprofen, triptans and narcotics.  This will help reduce risk of rebound headaches. 4.  Be aware of common food triggers such as processed sweets, processed foods with nitrites (such as deli meat, hot dogs, sausages), foods with MSG, alcohol (such as wine), chocolate, certain cheeses, certain fruits (dried fruits, some citrus fruit), vinegar, diet soda. 4.  Avoid caffeine 5.  Routine exercise 6.  Proper sleep hygiene 7.  Stay adequately hydrated with water 8.  Keep a headache diary. 9.  Maintain proper stress management. 10.  Do not skip meals. 11.  Take magnesium citrate 365mg  daily 15 days prior to menses until start of menses. 12.  Call in 4 weeks with update.  FOLLOW UP IN 4 to 5 months

## 2015-06-27 ENCOUNTER — Encounter: Payer: Self-pay | Admitting: Women's Health

## 2015-06-27 ENCOUNTER — Ambulatory Visit (INDEPENDENT_AMBULATORY_CARE_PROVIDER_SITE_OTHER): Payer: 59 | Admitting: Women's Health

## 2015-06-27 ENCOUNTER — Other Ambulatory Visit: Payer: Self-pay | Admitting: Women's Health

## 2015-06-27 VITALS — BP 122/80 | Ht 67.0 in | Wt 182.0 lb

## 2015-06-27 DIAGNOSIS — B3731 Acute candidiasis of vulva and vagina: Secondary | ICD-10-CM

## 2015-06-27 DIAGNOSIS — N946 Dysmenorrhea, unspecified: Secondary | ICD-10-CM

## 2015-06-27 DIAGNOSIS — N760 Acute vaginitis: Secondary | ICD-10-CM

## 2015-06-27 DIAGNOSIS — A499 Bacterial infection, unspecified: Secondary | ICD-10-CM | POA: Diagnosis not present

## 2015-06-27 DIAGNOSIS — Z13220 Encounter for screening for lipoid disorders: Secondary | ICD-10-CM

## 2015-06-27 DIAGNOSIS — R109 Unspecified abdominal pain: Secondary | ICD-10-CM

## 2015-06-27 DIAGNOSIS — B373 Candidiasis of vulva and vagina: Secondary | ICD-10-CM

## 2015-06-27 DIAGNOSIS — B9689 Other specified bacterial agents as the cause of diseases classified elsewhere: Secondary | ICD-10-CM

## 2015-06-27 DIAGNOSIS — R7309 Other abnormal glucose: Secondary | ICD-10-CM | POA: Diagnosis not present

## 2015-06-27 DIAGNOSIS — Z01419 Encounter for gynecological examination (general) (routine) without abnormal findings: Secondary | ICD-10-CM

## 2015-06-27 DIAGNOSIS — R10A2 Flank pain, left side: Secondary | ICD-10-CM

## 2015-06-27 LAB — CBC WITH DIFFERENTIAL/PLATELET
Basophils Absolute: 0 10*3/uL (ref 0.0–0.1)
Basophils Relative: 0 % (ref 0–1)
EOS ABS: 0 10*3/uL (ref 0.0–0.7)
EOS PCT: 1 % (ref 0–5)
HCT: 38.1 % (ref 36.0–46.0)
Hemoglobin: 13 g/dL (ref 12.0–15.0)
Lymphocytes Relative: 38 % (ref 12–46)
Lymphs Abs: 1.8 10*3/uL (ref 0.7–4.0)
MCH: 28.9 pg (ref 26.0–34.0)
MCHC: 34.1 g/dL (ref 30.0–36.0)
MCV: 84.7 fL (ref 78.0–100.0)
MONO ABS: 0.3 10*3/uL (ref 0.1–1.0)
MPV: 9.7 fL (ref 8.6–12.4)
Monocytes Relative: 7 % (ref 3–12)
Neutro Abs: 2.5 10*3/uL (ref 1.7–7.7)
Neutrophils Relative %: 54 % (ref 43–77)
PLATELETS: 220 10*3/uL (ref 150–400)
RBC: 4.5 MIL/uL (ref 3.87–5.11)
RDW: 14.5 % (ref 11.5–15.5)
WBC: 4.7 10*3/uL (ref 4.0–10.5)

## 2015-06-27 LAB — LIPID PANEL
Cholesterol: 185 mg/dL (ref 125–200)
HDL: 68 mg/dL (ref >=46)
LDL CALC: 97 mg/dL (ref <130)
Total CHOL/HDL Ratio: 2.7 Ratio (ref <=5.0)
Triglycerides: 102 mg/dL (ref <150)
VLDL: 20 mg/dL (ref <30)

## 2015-06-27 LAB — WET PREP FOR TRICH, YEAST, CLUE
Trich, Wet Prep: NONE SEEN
WBC, Wet Prep HPF POC: NONE SEEN
Yeast Wet Prep HPF POC: NONE SEEN

## 2015-06-27 LAB — GLUCOSE, RANDOM: Glucose, Bld: 106 mg/dL — ABNORMAL HIGH (ref 65–99)

## 2015-06-27 MED ORDER — FLUCONAZOLE 150 MG PO TABS: 150.0000 mg | ORAL_TABLET | Freq: Once | ORAL | Status: DC

## 2015-06-27 MED ORDER — OXYCODONE-ACETAMINOPHEN 5-325 MG PO TABS: 1.0000 | ORAL_TABLET | Freq: Three times a day (TID) | ORAL | Status: DC | PRN

## 2015-06-27 MED ORDER — METRONIDAZOLE 0.75 % VA GEL
VAGINAL | Status: DC
Start: 2015-06-27 — End: 2015-11-06

## 2015-06-27 MED FILL — metroNIDAZOLE 0.75 % GEL: 0.75 | 5 days supply | Qty: 70 | Fill #0

## 2015-06-27 MED FILL — OXYCODONE/APAP 5/325MG: 5-325 | 8 days supply | Qty: 25 | Fill #0

## 2015-06-27 MED FILL — FLUCONAZOLE 150 MG TABLET: 150 | 1 days supply | Qty: 1 | Fill #0

## 2015-06-27 NOTE — Patient Instructions (Addendum)
Health Maintenance, Female Adopting a healthy lifestyle and getting preventive care can go a long way to promote health and wellness. Talk with your health care provider about what schedule of regular examinations is right for you. This is a good chance for you to check in with your provider about disease prevention and staying healthy. In between checkups, there are plenty of things you can do on your own. Experts have done a lot of research about which lifestyle changes and preventive measures are most likely to keep you healthy. Ask your health care provider for more information. WEIGHT AND DIET  Eat a healthy diet  Be sure to include plenty of vegetables, fruits, low-fat dairy products, and lean protein.  Do not eat a lot of foods high in solid fats, added sugars, or salt.  Get regular exercise. This is one of the most important things you can do for your health.  Most adults should exercise for at least 150 minutes each week. The exercise should increase your heart rate and make you sweat (moderate-intensity exercise).  Most adults should also do strengthening exercises at least twice a week. This is in addition to the moderate-intensity exercise.  Maintain a healthy weight  Body mass index (BMI) is a measurement that can be used to identify possible weight problems. It estimates body fat based on height and weight. Your health care provider can help determine your BMI and help you achieve or maintain a healthy weight.  For females 20 years of age and older:   A BMI below 18.5 is considered underweight.  A BMI of 18.5 to 24.9 is normal.  A BMI of 25 to 29.9 is considered overweight.  A BMI of 30 and above is considered obese.  Watch levels of cholesterol and blood lipids  You should start having your blood tested for lipids and cholesterol at 30 years of age, then have this test every 5 years.  You may need to have your cholesterol levels checked more often if:  Your lipid  or cholesterol levels are high.  You are older than 30 years of age.  You are at high risk for heart disease.  CANCER SCREENING   Lung Cancer  Lung cancer screening is recommended for adults 55-80 years old who are at high risk for lung cancer because of a history of smoking.  A yearly low-dose CT scan of the lungs is recommended for people who:  Currently smoke.  Have quit within the past 15 years.  Have at least a 30-pack-year history of smoking. A pack year is smoking an average of one pack of cigarettes a day for 1 year.  Yearly screening should continue until it has been 15 years since you quit.  Yearly screening should stop if you develop a health problem that would prevent you from having lung cancer treatment.  Breast Cancer  Practice breast self-awareness. This means understanding how your breasts normally appear and feel.  It also means doing regular breast self-exams. Let your health care provider know about any changes, no matter how small.  If you are in your 20s or 30s, you should have a clinical breast exam (CBE) by a health care provider every 1-3 years as part of a regular health exam.  If you are 40 or older, have a CBE every year. Also consider having a breast X-ray (mammogram) every year.  If you have a family history of breast cancer, talk to your health care provider about genetic screening.  If you   are at high risk for breast cancer, talk to your health care provider about having an MRI and a mammogram every year.  Breast cancer gene (BRCA) assessment is recommended for women who have family members with BRCA-related cancers. BRCA-related cancers include:  Breast.  Ovarian.  Tubal.  Peritoneal cancers.  Results of the assessment will determine the need for genetic counseling and BRCA1 and BRCA2 testing. Cervical Cancer Your health care provider may recommend that you be screened regularly for cancer of the pelvic organs (ovaries, uterus, and  vagina). This screening involves a pelvic examination, including checking for microscopic changes to the surface of your cervix (Pap test). You may be encouraged to have this screening done every 3 years, beginning at age 21.  For women ages 30-65, health care providers may recommend pelvic exams and Pap testing every 3 years, or they may recommend the Pap and pelvic exam, combined with testing for human papilloma virus (HPV), every 5 years. Some types of HPV increase your risk of cervical cancer. Testing for HPV may also be done on women of any age with unclear Pap test results.  Other health care providers may not recommend any screening for nonpregnant women who are considered low risk for pelvic cancer and who do not have symptoms. Ask your health care provider if a screening pelvic exam is right for you.  If you have had past treatment for cervical cancer or a condition that could lead to cancer, you need Pap tests and screening for cancer for at least 20 years after your treatment. If Pap tests have been discontinued, your risk factors (such as having a new sexual partner) need to be reassessed to determine if screening should resume. Some women have medical problems that increase the chance of getting cervical cancer. In these cases, your health care provider may recommend more frequent screening and Pap tests. Colorectal Cancer  This type of cancer can be detected and often prevented.  Routine colorectal cancer screening usually begins at 30 years of age and continues through 30 years of age.  Your health care provider may recommend screening at an earlier age if you have risk factors for colon cancer.  Your health care provider may also recommend using home test kits to check for hidden blood in the stool.  A small camera at the end of a tube can be used to examine your colon directly (sigmoidoscopy or colonoscopy). This is done to check for the earliest forms of colorectal  cancer.  Routine screening usually begins at age 50.  Direct examination of the colon should be repeated every 5-10 years through 30 years of age. However, you may need to be screened more often if early forms of precancerous polyps or small growths are found. Skin Cancer  Check your skin from head to toe regularly.  Tell your health care provider about any new moles or changes in moles, especially if there is a change in a mole's shape or color.  Also tell your health care provider if you have a mole that is larger than the size of a pencil eraser.  Always use sunscreen. Apply sunscreen liberally and repeatedly throughout the day.  Protect yourself by wearing long sleeves, pants, a wide-brimmed hat, and sunglasses whenever you are outside. HEART DISEASE, DIABETES, AND HIGH BLOOD PRESSURE   High blood pressure causes heart disease and increases the risk of stroke. High blood pressure is more likely to develop in:  People who have blood pressure in the high end   of the normal range (130-139/85-89 mm Hg).  People who are overweight or obese.  People who are African American.  If you are 38-23 years of age, have your blood pressure checked every 3-5 years. If you are 61 years of age or older, have your blood pressure checked every year. You should have your blood pressure measured twice--once when you are at a hospital or clinic, and once when you are not at a hospital or clinic. Record the average of the two measurements. To check your blood pressure when you are not at a hospital or clinic, you can use:  An automated blood pressure machine at a pharmacy.  A home blood pressure monitor.  If you are between 45 years and 39 years old, ask your health care provider if you should take aspirin to prevent strokes.  Have regular diabetes screenings. This involves taking a blood sample to check your fasting blood sugar level.  If you are at a normal weight and have a low risk for diabetes,  have this test once every three years after 30 years of age.  If you are overweight and have a high risk for diabetes, consider being tested at a younger age or more often. PREVENTING INFECTION  Hepatitis B  If you have a higher risk for hepatitis B, you should be screened for this virus. You are considered at high risk for hepatitis B if:  You were born in a country where hepatitis B is common. Ask your health care provider which countries are considered high risk.  Your parents were born in a high-risk country, and you have not been immunized against hepatitis B (hepatitis B vaccine).  You have HIV or AIDS.  You use needles to inject street drugs.  You live with someone who has hepatitis B.  You have had sex with someone who has hepatitis B.  You get hemodialysis treatment.  You take certain medicines for conditions, including cancer, organ transplantation, and autoimmune conditions. Hepatitis C  Blood testing is recommended for:  Everyone born from [birth year] through 1965.  Anyone with known risk factors for hepatitis C. Sexually transmitted infections (STIs)  You should be screened for sexually transmitted infections (STIs) including gonorrhea and chlamydia if:  You are sexually active and are younger than 30 years of age.  You are older than 30 years of age and your health care provider tells you that you are at risk for this type of infection.  Your sexual activity has changed since you were last screened and you are at an increased risk for chlamydia or gonorrhea. Ask your health care provider if you are at risk.  If you do not have HIV, but are at risk, it may be recommended that you take a prescription medicine daily to prevent HIV infection. This is called pre-exposure prophylaxis (PrEP). You are considered at risk if:  You are sexually active and do not regularly use condoms or know the HIV status of your partner(s).  You take drugs by injection.  You are sexually  active with a partner who has HIV. Talk with your health care provider about whether you are at high risk of being infected with HIV. If you choose to begin PrEP, you should first be tested for HIV. You should then be tested every 3 months for as long as you are taking PrEP.  PREGNANCY   If you are premenopausal and you may become pregnant, ask your health care provider about preconception counseling.  If you may  become pregnant, take 400 to 800 micrograms (mcg) of folic acid every day.  If you want to prevent pregnancy, talk to your health care provider about birth control (contraception). OSTEOPOROSIS AND MENOPAUSE   Osteoporosis is a disease in which the bones lose minerals and strength with aging. This can result in serious bone fractures. Your risk for osteoporosis can be identified using a bone density scan.  If you are 62 years of age or older, or if you are at risk for osteoporosis and fractures, ask your health care provider if you should be screened.  Ask your health care provider whether you should take a calcium or vitamin D supplement to lower your risk for osteoporosis.  Menopause may have certain physical symptoms and risks.  Hormone replacement therapy may reduce some of these symptoms and risks. Talk to your health care provider about whether hormone replacement therapy is right for you.  HOME CARE INSTRUCTIONS   Schedule regular health, dental, and eye exams.  Stay current with your immunizations.   Do not use any tobacco products including cigarettes, chewing tobacco, or electronic cigarettes.  If you are pregnant, do not drink alcohol.  If you are breastfeeding, limit how much and how often you drink alcohol.  Limit alcohol intake to no more than 1 drink per day for nonpregnant women. One drink equals 12 ounces of beer, 5 ounces of wine, or 1 ounces of hard liquor.  Do not use street drugs.  Do not share needles.  Ask your health care provider for help if  you need support or information about quitting drugs.  Tell your health care provider if you often feel depressed.  Tell your health care provider if you have ever been abused or do not feel safe at home.   This information is not intended to replace advice given to you by your health care provider. Make sure you discuss any questions you have with your health care provider.   Document Released: 11/26/2010 Document Revised: 06/03/2014 Document Reviewed: 04/14/2013 Elsevier Interactive Patient Education 2016 Elsevier Inc. Bacterial Vaginosis Bacterial vaginosis is a vaginal infection that occurs when the normal balance of bacteria in the vagina is disrupted. It results from an overgrowth of certain bacteria. This is the most common vaginal infection in women of childbearing age. Treatment is important to prevent complications, especially in pregnant women, as it can cause a premature delivery. CAUSES  Bacterial vaginosis is caused by an increase in harmful bacteria that are normally present in smaller amounts in the vagina. Several different kinds of bacteria can cause bacterial vaginosis. However, the reason that the condition develops is not fully understood. RISK FACTORS Certain activities or behaviors can put you at an increased risk of developing bacterial vaginosis, including:  Having a new sex partner or multiple sex partners.  Douching.  Using an intrauterine device (IUD) for contraception. Women do not get bacterial vaginosis from toilet seats, bedding, swimming pools, or contact with objects around them. SIGNS AND SYMPTOMS  Some women with bacterial vaginosis have no signs or symptoms. Common symptoms include:  Grey vaginal discharge.  A fishlike odor with discharge, especially after sexual intercourse.  Itching or burning of the vagina and vulva.  Burning or pain with urination. DIAGNOSIS  Your health care provider will take a medical history and examine the vagina for  signs of bacterial vaginosis. A sample of vaginal fluid may be taken. Your health care provider will look at this sample under a microscope to check for bacteria  and abnormal cells. A vaginal pH test may also be done.  TREATMENT  Bacterial vaginosis may be treated with antibiotic medicines. These may be given in the form of a pill or a vaginal cream. A second round of antibiotics may be prescribed if the condition comes back after treatment. Because bacterial vaginosis increases your risk for sexually transmitted diseases, getting treated can help reduce your risk for chlamydia, gonorrhea, HIV, and herpes. HOME CARE INSTRUCTIONS   Only take over-the-counter or prescription medicines as directed by your health care provider.  If antibiotic medicine was prescribed, take it as directed. Make sure you finish it even if you start to feel better.  Tell all sexual partners that you have a vaginal infection. They should see their health care provider and be treated if they have problems, such as a mild rash or itching.  During treatment, it is important that you follow these instructions:  Avoid sexual activity or use condoms correctly.  Do not douche.  Avoid alcohol as directed by your health care provider.  Avoid breastfeeding as directed by your health care provider. SEEK MEDICAL CARE IF:   Your symptoms are not improving after 3 days of treatment.  You have increased discharge or pain.  You have a fever. MAKE SURE YOU:   Understand these instructions.  Will watch your condition.  Will get help right away if you are not doing well or get worse. FOR MORE INFORMATION  Centers for Disease Control and Prevention, Division of STD Prevention: AppraiserFraud.fi American Sexual Health Association (ASHA): www.ashastd.org    This information is not intended to replace advice given to you by your health care provider. Make sure you discuss any questions you have with your health care provider.    Document Released: 05/13/2005 Document Revised: 06/03/2014 Document Reviewed: 12/23/2012 Elsevier Interactive Patient Education [employer].

## 2015-06-27 NOTE — Progress Notes (Signed)
Penny Thomas 1986/02/06 [id number]    History:    Presents for annual exam.  Monthly 3-4 day cycle/BTL. 10/2011 ablation has had about a 50% reduction in days of flow and dysmenorrhea. Continues to need Roxicet with cycles uses 5-6 tablets each month. History of cryo-years ago with normal Paps after.   Past medical history, past surgical history, family history and social history were all reviewed and documented in the EPIC chart. Works at [employer] as a Designer, multimedia.Twins are 43 and has a 60-year-old all doing well.   ROS:  A ROS was performed and pertinent positives and negatives are included.  Exam:  Filed Vitals:   06/27/15 0824  BP: 122/80    General appearance:  Normal Thyroid:  Symmetrical, normal in size, without palpable masses or nodularity. Respiratory  Auscultation:  Clear without wheezing or rhonchi Cardiovascular  Auscultation:  Regular rate, without rubs, murmurs or gallops  Edema/varicosities:  Not grossly evident Abdominal  Soft,nontender, without masses, guarding or rebound.  Liver/spleen:  No organomegaly noted  Hernia:  None appreciated  Skin  Inspection:  Grossly normal   Breasts: Examined lying and sitting.     Right: Without masses, retractions, discharge or axillary adenopathy.     Left: Without masses, retractions, discharge or axillary adenopathy. Gentitourinary   Inguinal/mons:  Normal without inguinal adenopathy  External genitalia:  Normal  BUS/Urethra/Skene's glands:  Normal  Vagina:  Moderate white discharge, wet prep positive for moderate clues, TNTC bacteria  Cervix:  Normal  Uterus:  normal in size, shape and contour.  Midline and mobile  Adnexa/parametria:     Rt: Without masses or tenderness.   Lt: Without masses or tenderness.  Anus and perineum: Normal  Digital rectal exam: Normal sphincter tone without palpated masses or tenderness  Assessment/Plan:  30 y.o. MBF G2 P3 for annual exam with complaint of mild vaginal itching and continued  dysmenorrhea.    Monthly 3-4 day cycle/BTL 2013  Ablation Dysmenorrhea/endometriosis History of GDM BV Sickle cell trait  Plan: Prescription for Roxicet given instructed can only allow 5-6 tablets monthly, reviewed addictive properties, over-the-counter Motrin. SBE's, exercise, calcium rich diet, reviewed importance of decreasing calories for weight loss encouraged. MetroGel vaginal cream 1 applicator at bedtime 5, alcohol precautions reviewed. MVI daily encouraged. CBC, lipid panel, glucose, UA, Pap normal 2016, new screening guidelines reviewed.   Huel Cote WHNP, 2:07 PM 06/27/2015

## 2015-06-28 ENCOUNTER — Encounter: Payer: Self-pay | Admitting: Women's Health

## 2015-06-28 LAB — HEMOGLOBIN A1C
HEMOGLOBIN A1C: 5.7 % — AB (ref <5.7)
Mean Plasma Glucose: 117 mg/dL — ABNORMAL HIGH (ref <117)

## 2015-06-29 ENCOUNTER — Encounter: Payer: Self-pay | Admitting: Women's Health

## 2015-07-10 ENCOUNTER — Encounter: Payer: Self-pay | Admitting: Neurology

## 2015-07-10 MED ORDER — ELETRIPTAN HYDROBROMIDE 20 MG PO TABS: 20.0000 mg | ORAL_TABLET | ORAL | Status: DC | PRN

## 2015-07-10 MED FILL — RELPAX 20 MG TABLET: 20 | 30 days supply | Qty: 9 | Fill #0

## 2015-07-10 NOTE — Telephone Encounter (Signed)
Patient states that the Maxalt makes her nauseous and does not help her migraines. Please advise.

## 2015-07-10 NOTE — Telephone Encounter (Signed)
Patient states that she was on the Imitrex for so long it no longer helps either.

## 2015-07-10 NOTE — Addendum Note (Signed)
Addended by: Gerda Diss A on: 07/10/2015 04:22 PM   Modules accepted: Orders

## 2015-07-10 NOTE — Telephone Encounter (Addendum)
Please send Rx for Relpax 20mg  1 tablet at headache onset, if no improvement in 2 hour, okay to repeat, #10, 0 refills.

## 2015-07-10 NOTE — Telephone Encounter (Signed)
Please see mychart message.   On 06/23/15 Pt has OV. Changes made at that time were: 1. Increase amitriptyline to 50mg  at bedtime and she is to call in 4 weeks with update 2. Maxalt 10mg  for abortive therapy

## 2015-07-13 ENCOUNTER — Encounter: Payer: Self-pay | Admitting: Neurology

## 2015-07-27 ENCOUNTER — Encounter: Payer: Self-pay | Admitting: Family

## 2015-07-27 ENCOUNTER — Encounter: Payer: Self-pay | Admitting: Women's Health

## 2015-07-31 ENCOUNTER — Other Ambulatory Visit: Payer: Self-pay | Admitting: Family

## 2015-07-31 MED ORDER — OMEPRAZOLE 40 MG PO CPDR: 40.0000 mg | DELAYED_RELEASE_CAPSULE | Freq: Every day | ORAL | Status: DC

## 2015-07-31 MED FILL — OMEPRAZOLE DR 40 MG CAPSULE: 40 | 30 days supply | Qty: 30 | Fill #0

## 2015-08-03 MED FILL — AMITRIPTYLINE HCL 50 MG TAB: 50 | 30 days supply | Qty: 30 | Fill #0

## 2015-08-10 ENCOUNTER — Encounter: Payer: Self-pay | Admitting: Neurology

## 2015-08-11 ENCOUNTER — Encounter: Payer: Self-pay | Admitting: Neurology

## 2015-08-11 NOTE — Telephone Encounter (Signed)
Please see mychart message.

## 2015-08-11 NOTE — Telephone Encounter (Signed)
Pt is currently taking amitriptyline 50 mg QHS. See mychart message. Please advise.

## 2015-08-14 ENCOUNTER — Encounter: Payer: Self-pay | Admitting: Neurology

## 2015-08-14 MED ORDER — TOPIRAMATE 50 MG PO TABS: 50.0000 mg | ORAL_TABLET | Freq: Every day | ORAL | Status: DC

## 2015-08-14 MED FILL — TOPIRAMATE 50 MG TABLET: 50 | 30 days supply | Qty: 30 | Fill #0

## 2015-08-14 NOTE — Telephone Encounter (Signed)
Previous mychart message from pt: Can I switch migraine medication since I have gained a lot of weight or is their a pill I can take to make it so I don't gains weight like this (antyphline) went from 25mg  at bedtime to 36m.also did you all received my paper work from the stomach doctor I saw?   Please advise.

## 2015-08-14 NOTE — Telephone Encounter (Signed)
topiramate 25mg  at bedtime for 7 days and then increase to 50mg  at bedtime. RX submitted.

## 2015-08-17 ENCOUNTER — Encounter: Payer: Self-pay | Admitting: Family

## 2015-08-21 MED FILL — HYDROCODON-APAP 7.5-325: 7.5-325 | 3 days supply | Qty: 20 | Fill #0

## 2015-08-21 MED FILL — PROMETHAZINE 25 MG TABLET: 25 | 2 days supply | Qty: 10 | Fill #0

## 2015-08-21 MED FILL — AMOXICILLIN 500 MG CAPSULE: 500 | 7 days supply | Qty: 21 | Fill #0

## 2015-08-23 ENCOUNTER — Encounter: Payer: Self-pay | Admitting: Family

## 2015-08-24 ENCOUNTER — Telehealth: Payer: 59 | Admitting: Physician Assistant

## 2015-08-24 DIAGNOSIS — R21 Rash and other nonspecific skin eruption: Secondary | ICD-10-CM

## 2015-08-24 NOTE — Progress Notes (Signed)
Based on what you shared with me it looks like you have a serious condition that should be evaluated in a face to face office visit. Giving the spreading rash and how long it has been present, along with sons diagnosis of strep throat, I recommend you be seen by your primary care or at one of the locations below for further assessment of the rash and potentially a swab of your throat. The question is -- is it an allergic rash, a rash from strep infection or potentially a shingles rash. It is very hard to tell from pictures.  For itch, use Benadryl at night and over-the-counter Sarna lotion to help alleviate symptoms.   If you are having a true medical emergency please call 911.  If you need an urgent face to face visit, [facility] has four urgent care centers for your convenience.  . [facility] a Provider at this Location  [address] [place], [place] [address] . 8 am to 8 pm Monday-Friday . 9 am to 7 pm Saturday-Sunday  . [facility] [place] a Provider at this Location  [address], [place], [place] [address] . 8 am to 8 pm Monday-Friday . 9 am to 6 pm Saturday . 11 am to 6 pm Sunday   . [facility] [place] Get Driving Directions  [id number] [place], [place] [address] . 8 am to 8 pm Monday-Friday . 9 am to 4 pm Saturday-Sunday   . [facility] (a walk in primary care provider)  [place] a Provider at this Location  [place], [place] [address] . 8 am to 8:30 pm Monday-Thursday . 8 am to 6 pm Friday . 8 am to 4 pm Saturday-Sunday   Your e-visit answers were reviewed by a board certified advanced clinical practitioner to complete your personal care plan.  Thank you for using e-Visits.

## 2015-08-24 NOTE — Telephone Encounter (Signed)
appt pended and waiting for pt to confirm

## 2015-08-24 NOTE — Telephone Encounter (Signed)
My Chart message states that pt was not able to get through the phone lines. Called pt to schedule. LVM for pt to call back as soon as possible.   RE: acute visit for rash.

## 2015-08-25 ENCOUNTER — Ambulatory Visit: Payer: 59 | Admitting: Family

## 2015-08-25 DIAGNOSIS — J029 Acute pharyngitis, unspecified: Secondary | ICD-10-CM | POA: Diagnosis not present

## 2015-08-25 DIAGNOSIS — Z9289 Personal history of other medical treatment: Secondary | ICD-10-CM | POA: Diagnosis not present

## 2015-08-25 DIAGNOSIS — L2089 Other atopic dermatitis: Secondary | ICD-10-CM | POA: Diagnosis not present

## 2015-08-25 MED FILL — METHYLPREDNISOLONE 4 MG TAB: 4 | 6 days supply | Qty: 21 | Fill #0

## 2015-08-25 MED FILL — TRIAMCINOLONE 0.1% OINTMENT: 0.1 | 20 days supply | Qty: 80 | Fill #0

## 2015-08-26 ENCOUNTER — Other Ambulatory Visit: Payer: Self-pay | Admitting: Women's Health

## 2015-08-26 DIAGNOSIS — B9689 Other specified bacterial agents as the cause of diseases classified elsewhere: Secondary | ICD-10-CM

## 2015-08-26 DIAGNOSIS — N760 Acute vaginitis: Secondary | ICD-10-CM

## 2015-08-28 ENCOUNTER — Telehealth: Payer: Self-pay | Admitting: Family

## 2015-08-28 MED FILL — OMEPRAZOLE DR 40 MG CAPSULE: 40 | 30 days supply | Qty: 30 | Fill #1

## 2015-08-28 NOTE — Telephone Encounter (Signed)
Rec'd from [place] at [facility] forward 13 pages to [facility]

## 2015-08-29 ENCOUNTER — Encounter: Payer: Self-pay | Admitting: Family

## 2015-08-29 ENCOUNTER — Encounter: Payer: Self-pay | Admitting: Women's Health

## 2015-08-29 DIAGNOSIS — B373 Candidiasis of vulva and vagina: Secondary | ICD-10-CM

## 2015-08-29 DIAGNOSIS — B3731 Acute candidiasis of vulva and vagina: Secondary | ICD-10-CM

## 2015-08-30 ENCOUNTER — Other Ambulatory Visit: Payer: Self-pay | Admitting: Women's Health

## 2015-08-30 DIAGNOSIS — B3731 Acute candidiasis of vulva and vagina: Secondary | ICD-10-CM

## 2015-08-30 DIAGNOSIS — B373 Candidiasis of vulva and vagina: Secondary | ICD-10-CM

## 2015-08-30 MED ORDER — FLUCONAZOLE 150 MG PO TABS: 150.0000 mg | ORAL_TABLET | Freq: Once | ORAL | Status: DC

## 2015-08-30 MED FILL — FLUCONAZOLE 150 MG TABLET: 150 | 1 days supply | Qty: 1 | Fill #0

## 2015-09-03 MED FILL — RIZATRIPTAN 10 MG ODT: 10 | 30 days supply | Qty: 9 | Fill #1

## 2015-09-03 MED FILL — AMITRIPTYLINE HCL 50 MG TAB: 50 | 30 days supply | Qty: 30 | Fill #1

## 2015-09-07 MED FILL — TOPIRAMATE 50 MG TABLET: 50 | 30 days supply | Qty: 30 | Fill #1

## 2015-09-08 ENCOUNTER — Encounter: Payer: Self-pay | Admitting: Women's Health

## 2015-09-08 ENCOUNTER — Other Ambulatory Visit: Payer: Self-pay | Admitting: Neurology

## 2015-09-12 ENCOUNTER — Encounter: Payer: Self-pay | Admitting: Family

## 2015-09-12 MED ORDER — ELETRIPTAN HYDROBROMIDE 20 MG PO TABS: 20.0000 mg | ORAL_TABLET | ORAL | Status: DC | PRN

## 2015-09-12 MED FILL — RELPAX 20 MG TABLET: 20 | 30 days supply | Qty: 9 | Fill #0

## 2015-09-12 NOTE — Addendum Note (Signed)
Addended by: Gerda Diss A on: 09/12/2015 08:06 AM   Modules accepted: Orders

## 2015-09-18 ENCOUNTER — Encounter: Payer: Self-pay | Admitting: Neurology

## 2015-09-18 ENCOUNTER — Other Ambulatory Visit: Payer: Self-pay | Admitting: Neurology

## 2015-09-18 MED ORDER — TOPIRAMATE 50 MG PO TABS: ORAL_TABLET | ORAL | Status: DC

## 2015-09-18 NOTE — Telephone Encounter (Signed)
Pt currently on Topamax 50 mg QHS. Please advise.

## 2015-09-26 MED FILL — TOPIRAMATE 50 MG TABLET: 50 | 30 days supply | Qty: 60 | Fill #0

## 2015-10-12 ENCOUNTER — Encounter: Payer: Self-pay | Admitting: Neurology

## 2015-10-12 ENCOUNTER — Telehealth: Payer: 59 | Admitting: Physician Assistant

## 2015-10-12 DIAGNOSIS — L239 Allergic contact dermatitis, unspecified cause: Secondary | ICD-10-CM

## 2015-10-12 DIAGNOSIS — L200 Besnier's prurigo: Secondary | ICD-10-CM | POA: Diagnosis not present

## 2015-10-12 MED ORDER — PREDNISONE 10 MG PO TABS: 10.0000 mg | ORAL_TABLET | Freq: Every day | ORAL | Status: DC

## 2015-10-12 MED FILL — predniSONE 10 MG TABS: 10 | 5 days supply | Qty: 5 | Fill #0

## 2015-10-12 NOTE — Telephone Encounter (Signed)
Please see mychart message. Pt's upcoming appointment is for 11/02/15.

## 2015-10-12 NOTE — Progress Notes (Signed)
E Visit for Rash  We are sorry that you are not feeling well. Here is how we plan to help!  Based on what you shared with me it looks like you have allergic contact dermatitis from your body's response to bed bug bites.  Contact dermatitis is a skin rash caused by something that touches the skin and causes irritation or inflammation.  Your skin may be red, swollen, dry, cracked, and itch.  The rash should go away in a few days but can last a few weeks.  If you get a rash, it's important to figure out what caused it so the irritant can be avoided in the future. and I have prescribed Prednisone 10 mg daily for 5 days. You need to [facility] that your home is rid of bed bugs. You may need to call in a exterminator with experience. Wash all bed linens, towels, etc in hot water. Then dry and store what you can in an air-tight container for a week. This will kill off any remaining trouble makers. Then wash again before using.     HOME CARE:   Take cool showers and avoid direct sunlight.  Apply cool compress or wet dressings.  Take a bath in an oatmeal bath.  Sprinkle content of one Aveeno packet under running faucet with comfortably warm water.  Bathe for 15-20 minutes, 1-2 times daily.  Pat dry with a towel. Do not rub the rash.  Use hydrocortisone cream.  Take an antihistamine like Benadryl for widespread rashes that itch.  The adult dose of Benadryl is 25-50 mg by mouth 4 times daily.  Caution:  This type of medication may cause sleepiness.  Do not drink alcohol, drive, or operate dangerous machinery while taking antihistamines.  Do not take these medications if you have prostate enlargement.  Read package instructions thoroughly on all medications that you take.  GET HELP RIGHT AWAY IF:   Symptoms don't go away after treatment.  Severe itching that persists.  If you rash spreads or swells.  If you rash begins to smell.  If it blisters and opens or develops a yellow-brown crust.  You  develop a fever.  You have a sore throat.  You become short of breath.  MAKE SURE YOU:  Understand these instructions. Will watch your condition. Will get help right away if you are not doing well or get worse.  Thank you for choosing an e-visit. Your e-visit answers were reviewed by a board certified advanced clinical practitioner to complete your personal care plan. Depending upon the condition, your plan could have included both over the counter or prescription medications. Please review your pharmacy choice. Be sure that the pharmacy you have chosen is open so that you can pick up your prescription now.  If there is a problem you may message your provider in [place] to have the prescription routed to another pharmacy. Your safety is important to [place]. If you have drug allergies check your prescription carefully.  For the next 24 hours, you can use MyChart to ask questions about today's visit, request a non-urgent call back, or ask for a work or school excuse from your e-visit provider. You will get an email in the next two days asking about your experience. I hope that your e-visit has been valuable and will speed your recovery.

## 2015-10-13 ENCOUNTER — Encounter: Payer: Self-pay | Admitting: Neurology

## 2015-10-15 ENCOUNTER — Encounter: Payer: Self-pay | Admitting: Family

## 2015-10-16 MED ORDER — OMEPRAZOLE 40 MG PO CPDR
40.0000 mg | DELAYED_RELEASE_CAPSULE | Freq: Every day | ORAL | Status: DC
Start: 2015-10-16 — End: 2015-11-06

## 2015-10-16 MED FILL — OMEPRAZOLE DR 40 MG CAPSULE: 40 | 30 days supply | Qty: 30 | Fill #0

## 2015-10-19 MED ORDER — TOPIRAMATE 100 MG PO TABS: 150.0000 mg | ORAL_TABLET | Freq: Every day | ORAL | Status: DC

## 2015-10-19 MED FILL — TOPIRAMATE 100 MG TABLET: 100 | 40 days supply | Qty: 60 | Fill #0

## 2015-10-19 NOTE — Telephone Encounter (Signed)
Please see pt's response to my mychart message asking how her headaches were. Please advise.

## 2015-10-30 ENCOUNTER — Encounter: Payer: Self-pay | Admitting: Women's Health

## 2015-10-30 ENCOUNTER — Ambulatory Visit: Payer: 59 | Admitting: Neurology

## 2015-10-30 NOTE — Telephone Encounter (Signed)
Can wait till tomorrow for [employer]

## 2015-10-31 ENCOUNTER — Other Ambulatory Visit: Payer: Self-pay | Admitting: Women's Health

## 2015-10-31 MED FILL — OXYCODONE/APAP 5/325 MG TAB: 5-325 | 13 days supply | Qty: 40 | Fill #0

## 2015-11-01 ENCOUNTER — Other Ambulatory Visit: Payer: Self-pay | Admitting: Women's Health

## 2015-11-01 ENCOUNTER — Telehealth: Payer: Self-pay | Admitting: Gynecology

## 2015-11-01 DIAGNOSIS — N946 Dysmenorrhea, unspecified: Secondary | ICD-10-CM

## 2015-11-01 NOTE — Telephone Encounter (Signed)
11/01/15-I LM VM cell for pt to let her know that the [facility] ins states pt has a 20% coinsurance for test & bx. Allowable for test is $893.27 so pt cost is $178.65.  If bx needed, allowable is 275.02 so pt cost would be $55.00 additional. Per Waunita Schooner @[facility] -[id number]

## 2015-11-02 ENCOUNTER — Other Ambulatory Visit: Payer: Self-pay | Admitting: Gynecology

## 2015-11-02 ENCOUNTER — Ambulatory Visit: Payer: 59 | Admitting: Neurology

## 2015-11-02 DIAGNOSIS — N946 Dysmenorrhea, unspecified: Secondary | ICD-10-CM

## 2015-11-02 DIAGNOSIS — N939 Abnormal uterine and vaginal bleeding, unspecified: Secondary | ICD-10-CM

## 2015-11-06 ENCOUNTER — Ambulatory Visit (INDEPENDENT_AMBULATORY_CARE_PROVIDER_SITE_OTHER): Payer: 59

## 2015-11-06 ENCOUNTER — Other Ambulatory Visit: Payer: 59

## 2015-11-06 ENCOUNTER — Encounter: Payer: Self-pay | Admitting: Gynecology

## 2015-11-06 ENCOUNTER — Other Ambulatory Visit: Payer: Self-pay | Admitting: Gynecology

## 2015-11-06 ENCOUNTER — Ambulatory Visit (INDEPENDENT_AMBULATORY_CARE_PROVIDER_SITE_OTHER): Payer: 59 | Admitting: Gynecology

## 2015-11-06 ENCOUNTER — Ambulatory Visit: Payer: 59 | Admitting: Gynecology

## 2015-11-06 VITALS — BP 124/80

## 2015-11-06 DIAGNOSIS — N946 Dysmenorrhea, unspecified: Secondary | ICD-10-CM

## 2015-11-06 DIAGNOSIS — N839 Noninflammatory disorder of ovary, fallopian tube and broad ligament, unspecified: Secondary | ICD-10-CM

## 2015-11-06 DIAGNOSIS — N719 Inflammatory disease of uterus, unspecified: Secondary | ICD-10-CM | POA: Diagnosis not present

## 2015-11-06 DIAGNOSIS — N939 Abnormal uterine and vaginal bleeding, unspecified: Secondary | ICD-10-CM | POA: Diagnosis not present

## 2015-11-06 DIAGNOSIS — N838 Other noninflammatory disorders of ovary, fallopian tube and broad ligament: Secondary | ICD-10-CM

## 2015-11-06 DIAGNOSIS — N83201 Unspecified ovarian cyst, right side: Secondary | ICD-10-CM

## 2015-11-06 DIAGNOSIS — N920 Excessive and frequent menstruation with regular cycle: Secondary | ICD-10-CM

## 2015-11-06 NOTE — Progress Notes (Signed)
    Penny Thomas 07-Jun-1985 [id number]        30 y.o.  Penny Thomas presents for sonohysterogram. History of [name] endometrial ablation 4 years ago. Did well with much lighter and less crampy menses from 7 days to 3 days until about 6 months ago. At that point her menses started to lengthen now 7 days. Not as heavy as before but changing pads regularly. Also with significant dysmenorrhea for the first several days.  Past medical history,surgical history, problem list, medications, allergies, family history and social history were all reviewed and documented in the EPIC chart.  Directed ROS with pertinent positives and negatives documented in the history of present illness/assessment and plan.  Exam: [place] assistant Filed Vitals:   11/06/15 1114  BP: 124/80   General appearance:  Normal Abdomen soft nontender without masses guarding rebound Pelvic external BUS vagina normal. Cervix normal. Uterus normal size midline mobile nontender. Adnexa without masses or tenderness.  Ultrasound shows uterus normal size and echotexture. Endometrial echo 5.1 mm, try layered. Right ovary with thin walled cystic area 41 mm mean some echogenic debris, avascular consistent with hemorrhagic cyst. Left ovary normal. Cul-de-sac negative.  Sonohysterogram performed, sterile technique, easy catheter introduction, good distention with no abnormalities.  Endometrial sample taken. Patient tolerated well  Assessment/Plan:  30 y.o. G3P3 with recurrent menorrhagia, dysmenorrhea. Status post endometrial ablation. Cavity appears open. Patient will follow up for biopsy results. Options for management were discussed to include hormonal manipulation, Mirena IUD, repeat endometrial ablation, hysterectomy. The pros/cons of each choice reviewed with the patient. Patient's going to think of her options in follow up for the biopsy results. She does have a cyst on her right ovary. Will plan expectant management with repeat  ultrasound in several months. If she proceeds with hysterectomy then we'll plan removal if still present at the time of surgery.    Anastasio Auerbach MD, 11:24 AM 11/06/2015

## 2015-11-06 NOTE — Patient Instructions (Signed)
Office will call you with biopsy results. You will think about our options as we discussed.

## 2015-11-06 NOTE — Telephone Encounter (Signed)
Possible but I don't think so. My plans with this is to really look at the cyst in 2 months with ultrasound and I suspect that it will resolve on its own. If we plan surgery is I will take a look at it at that time. If it persists on ultrasound and we may ultimately discuss surgery to remove it but likelihood is it will resolve on its own.

## 2015-11-08 ENCOUNTER — Telehealth: Payer: Self-pay

## 2015-11-08 NOTE — Progress Notes (Signed)
I will check on it and call her back with that info.

## 2015-11-08 NOTE — Telephone Encounter (Signed)
I called patient because she had asked me to check on her insurance and see what it would cost her to have Her Option ablation in the office.  I spoke with Penny Thomas at [facility] and she has a $40 copayment and then ins pays 100%. No prior authorization and precertification required.  I advised patient of this. She wants to consider and talk with her Mom. She will call me when she decides.

## 2015-11-21 ENCOUNTER — Other Ambulatory Visit: Payer: Self-pay | Admitting: Gynecology

## 2015-11-21 ENCOUNTER — Telehealth: Payer: Self-pay

## 2015-11-21 DIAGNOSIS — N920 Excessive and frequent menstruation with regular cycle: Secondary | ICD-10-CM

## 2015-11-21 MED ORDER — MISOPROSTOL 200 MCG PO TABS: ORAL_TABLET | ORAL | Status: DC

## 2015-11-21 MED FILL — miSOPROStol 200 MCG TABS: 200 | 1 days supply | Qty: 1 | Fill #0

## 2015-11-21 NOTE — Telephone Encounter (Signed)
I called patient back. She anticipates period to start next week. LMP was 6/5-6/9 and she said her periods usually come around the same time every mos.  She had asked about doing procedure on 12/21/15 and this should work fine as long as her menses does not come earlier than anticipated. She will call me when it starts so that we can send Rx for Prometrium 200 mg to start on Day 3 of menses and take qhs until procedure.  I reminded her that she will need to have someone to drive her to and from procedure and full bladder will be needed for procedure. We reviewed those instructions and I will mail them to her as well.  I told her about need for Cytotec vaginally hs before surgery and I have sent that to her pharmacy.  She will call me if any questions. Pre op and post op appts were also scheduled.

## 2015-11-21 NOTE — Telephone Encounter (Signed)
Okay to arrange per our protocol

## 2015-11-21 NOTE — Telephone Encounter (Signed)
Patient would like to schedule Her Option in July if possible.

## 2015-11-27 ENCOUNTER — Encounter: Payer: Self-pay | Admitting: Neurology

## 2015-11-27 ENCOUNTER — Other Ambulatory Visit: Payer: Self-pay | Admitting: *Deleted

## 2015-11-27 MED ORDER — TOPIRAMATE 50 MG PO TABS: 50.0000 mg | ORAL_TABLET | Freq: Every day | ORAL | Status: DC

## 2015-11-27 MED FILL — OMEPRAZOLE DR 40 MG CAPSULE: 40 | 30 days supply | Qty: 30 | Fill #1

## 2015-11-27 MED FILL — TOPIRAMATE 50 MG TABLET: 50 | 30 days supply | Qty: 30 | Fill #0

## 2015-11-29 ENCOUNTER — Encounter: Payer: Self-pay | Admitting: Gynecology

## 2015-11-29 ENCOUNTER — Telehealth: Payer: Self-pay

## 2015-11-29 MED ORDER — PROGESTERONE MICRONIZED 200 MG PO CAPS: ORAL_CAPSULE | ORAL | Status: DC

## 2015-11-29 MED FILL — PROGESTERONE 200 MG CAPSULE: 200 | 20 days supply | Qty: 20 | Fill #0

## 2015-11-29 NOTE — Telephone Encounter (Signed)
Patient e-mailed me through My Chart that her menses began today 11/29/15.  She will start Prometrium on Friday (Day 3) and was instructed by e-mail to start this day and take hs every day until procedure. Rx sent.

## 2015-11-30 ENCOUNTER — Encounter: Payer: Self-pay | Admitting: Women's Health

## 2015-12-01 ENCOUNTER — Other Ambulatory Visit: Payer: Self-pay | Admitting: Women's Health

## 2015-12-01 DIAGNOSIS — N946 Dysmenorrhea, unspecified: Secondary | ICD-10-CM

## 2015-12-01 MED ORDER — OXYCODONE HCL 5 MG PO CAPS: ORAL_CAPSULE | ORAL | Status: DC

## 2015-12-08 ENCOUNTER — Ambulatory Visit (INDEPENDENT_AMBULATORY_CARE_PROVIDER_SITE_OTHER): Payer: 59 | Admitting: Gynecology

## 2015-12-08 ENCOUNTER — Encounter: Payer: Self-pay | Admitting: Gynecology

## 2015-12-08 VITALS — BP 118/76

## 2015-12-08 DIAGNOSIS — N920 Excessive and frequent menstruation with regular cycle: Secondary | ICD-10-CM

## 2015-12-08 MED ORDER — DIAZEPAM 5 MG PO TABS: 5.0000 mg | ORAL_TABLET | Freq: Four times a day (QID) | ORAL | Status: DC | PRN

## 2015-12-08 MED FILL — diazePAM 5 MG TABS: 5 | 2 days supply | Qty: 10 | Fill #0

## 2015-12-08 NOTE — Patient Instructions (Signed)
Follow up for the ablation as scheduled. Call beforehand if you have any questions whatsoever.

## 2015-12-08 NOTE — Progress Notes (Signed)
    Penny Thomas 15-Apr-1986 [id number]        30 y.o.  [name] presents for preoperative discussion in reference to her menorrhagia and scheduled [name] endometrial ablation. Patient has a history of her option endometrial ablation 4 years ago with initial good relief of her menorrhagia. Unfortunately her periods have lengthened back out to 7 days with heavier bleeding, double protection, bleedthrough episodes. Options for management were reviewed with the patient to include hormonal manipulation, Mirena IUD, repeat endometrial ablation, hysterectomy. Sonohysterogram recently performed showed a normal-appearing cavity with no defects.  Endometrial biopsy showed proliferative endometrium with breakdown.  Past medical history,surgical history, problem list, medications, allergies, family history and social history were all reviewed and documented in the EPIC chart.  Directed ROS with pertinent positives and negatives documented in the history of present illness/assessment and plan.  Exam: [name] assistant Filed Vitals:   12/08/15 0800  BP: 118/76   General appearance:  Normal Abdomen soft nontender without masses guarding rebound Pelvic external BUS vagina normal. Cervix normal. Uterus normal size midline mobile nontender. Adnexa without masses or tenderness.  Assessment/Plan:  30 y.o. G3P3 with unacceptable menorrhagia for [name] endometrial ablation.  I reviewed the procedure with her to include the use of the various medications as prescribed and the intraoperative procedure. The recovery period was also reviewed with her. The risks discussed include the risk of infection, damage to internal organs including bowel bladder ureters vessels and nerves either to direct uterine perforation or transuterine cryo-damage requiring subsequent surgeries for repair, bleeding to the point of necessitating transfusion. She is status post cesarean section 3 and possible increased risk of  uterine perforation/transuterine cryo-damage discussed.  She understands that she should never pursue pregnancy following this procedure and she is status post tubal sterilization. She also understands given that this is a repeat procedure there is no guarantees that this will relieve her menorrhagia and that it may continue heavy or worsen requiring subsequent alternative treatments.  Patient's questions were answered to her satisfaction and she is ready to proceed with surgery. She was given a prescription for Valium 5 mg #10. She has the Prometrium and Cytotec prescriptions. She also has a supply of narcotic pain medication that was given by Izora Gala earlier for dysmenorrhea. Lastly she has read through the consent form, has no questions and signed this which was scanned into Epic.    Anastasio Auerbach MD, 8:19 AM 12/08/2015

## 2015-12-11 ENCOUNTER — Encounter: Payer: Self-pay | Admitting: Neurology

## 2015-12-11 MED ORDER — TOPIRAMATE 100 MG PO TABS: 150.0000 mg | ORAL_TABLET | Freq: Every day | ORAL | Status: DC

## 2015-12-11 MED FILL — TOPIRAMATE 100 MG TABLET: 100 | 30 days supply | Qty: 45 | Fill #0

## 2015-12-21 ENCOUNTER — Ambulatory Visit (INDEPENDENT_AMBULATORY_CARE_PROVIDER_SITE_OTHER): Payer: 59

## 2015-12-21 ENCOUNTER — Other Ambulatory Visit: Payer: Self-pay

## 2015-12-21 ENCOUNTER — Ambulatory Visit (INDEPENDENT_AMBULATORY_CARE_PROVIDER_SITE_OTHER): Payer: 59 | Admitting: Gynecology

## 2015-12-21 ENCOUNTER — Encounter: Payer: Self-pay | Admitting: Gynecology

## 2015-12-21 VITALS — BP 118/74

## 2015-12-21 DIAGNOSIS — N920 Excessive and frequent menstruation with regular cycle: Secondary | ICD-10-CM

## 2015-12-21 DIAGNOSIS — R102 Pelvic and perineal pain: Secondary | ICD-10-CM

## 2015-12-21 MED ORDER — KETOROLAC TROMETHAMINE 30 MG/ML IJ SOLN
60.0000 mg | Freq: Once | INTRAMUSCULAR | Status: AC
  Administered 2015-12-21: 60 mg via INTRAMUSCULAR

## 2015-12-21 MED ORDER — LIDOCAINE HCL 1 % IJ SOLN
10.0000 mL | Freq: Once | INTRAMUSCULAR | Status: AC
  Administered 2015-12-21: 10 mL

## 2015-12-21 NOTE — Progress Notes (Addendum)
HER OPTION ENDOMETRIAL ABLATION PROCEDURAL NOTE    Penny Thomas 1986/03/01 [id number]   12/21/2015  Diagnosis:  Excessive uterine bleeding / Menorrhagia  Procedure:  Endometrial cryoablation with intraoperative ultrasonic guidance  Procedure:  The patient was brought to the treatment room having previously been counseled for the procedure and having signed the consent form that is scanned into Epic. Pre procedural medications received were Valium 5 mg, oxycodone/acetaminophen 5/235, Toradol 60 mg IM.  The patient was placed in the dorsal lithotomy position and a speculum was inserted. The cervix and upper vagina were cleansed with Betadine. A single-tooth tenaculum was placed on the anterior lip of the cervix. A  paracervical block was placed Yes.   using 10 cc's of 1% lidocaine.  The uterus was Yes.   sounded 8 centimeters. Cervical dilatation performed No.  Under ultrasound guidance, the Her Option probe was introduced into the uterine cavity after the preprocedural sequence was performed. After assuring proper cornual placement, cryoablation was then performed under continuous ultrasound guidance monitoring the growth of the cryo-zone. Sequential cryoablation's were performed in the following order:   Location   Length of Time  Myometrial Depth Post procedure 1. Right cornua   5 minutes 45 seconds 5.4 mm sagittal 6.1 mm transverse 2. Left cornua   5 minutes   7.0 mm sagittal 6.4 mm transverse   Upon completion of the procedure the instruments were removed, hemostasis visualized the patient was assisted to the bathroom and then to another exam room where she was observed. The patient tolerated the procedure well and was released in stable condition with her driver along with a copy of the postprocedural instructions and precautions which were reviewed with her. She is to return to the office in 2 weeks for post procedural check and received an appointment date and time.  Of note she  did have a prior right ovarian cyst at 41 mm noted on her sonohysterogram ultrasound. That cyst has resolved and there is no evidence of this today on ultrasound.    Anastasio Auerbach MD, 10:25 AM 12/21/2015

## 2015-12-21 NOTE — Patient Instructions (Signed)
Endometrial Ablation °Endometrial ablation removes the lining of the uterus (endometrium). It is usually a same-day, outpatient treatment. Ablation helps avoid major surgery, such as surgery to remove the cervix and uterus (hysterectomy). After endometrial ablation, you will have little or no menstrual bleeding and may not be able to have children. However, if you are premenopausal, you will need to use a reliable method of birth control following the procedure because of the small chance that pregnancy can occur. °There are different reasons to have this procedure. These reasons include: °· Heavy periods. °· Bleeding that is causing anemia. °· Irregular bleeding. °· Bleeding fibroids on the lining inside the uterus if they are smaller than 3 centimeters. °This procedure may not be possible for you if:  °· You want to have children in the future.   °· You have severe cramps with your menstrual period.   °· You have precancerous or cancerous cells in your uterus.   °· You were recently pregnant.   °· You have gone through menopause.   °· You have had major surgery on your uterus, resulting in thinning of the uterine wall. Surgeries may include: °¨ The removal of one or more uterine fibroids (myomectomy). °¨ A cesarean section with a classic (vertical) incision on your uterus. Ask your health care provider what type of cesarean you had. Sometimes the scar on your skin is different than the scar on your uterus. °Even if you have had surgery on your uterus, certain types of ablation may still be safe for you. Talk with your health care provider. °LET YOUR HEALTH CARE PROVIDER KNOW ABOUT: °· Any allergies you have. °· All medicines you are taking, including vitamins, herbs, eye drops, creams, and over-the-counter medicines. °· Previous problems you or members of your family have had with the use of anesthetics. °· Any blood disorders you have. °· Previous surgeries you have had. °· Medical conditions you have. °RISKS AND  COMPLICATIONS  °Generally, this is a safe procedure. However, as with any procedure, complications can occur. Possible complications include: °· Perforation of the uterus. °· Bleeding. °· Infection of the uterus, bladder, or vagina. °· Injury to surrounding organs. °· An air bubble to the lung (air embolus). °· Pregnancy following the procedure. °· Failure of the procedure to help the problem, requiring hysterectomy. °· Decreased ability to diagnose cancer in the lining of the uterus. °BEFORE THE PROCEDURE °· The lining of the uterus must be tested to make sure there is no pre-cancerous or cancer cells present. °· An ultrasound may be performed to look at the size of the uterus and to check for abnormalities. °· Medicines may be given to thin the lining of the uterus. °PROCEDURE  °During the procedure, your health care provider will use a tool called a resectoscope to help see inside your uterus. There are different ways to remove the lining of your uterus.  °· Radiofrequency - This method uses a radiofrequency-alternating electric current to remove the lining of the uterus. °· Cryotherapy - This method uses extreme cold to freeze the lining of the uterus. °· Heated-Free Liquid - This method uses heated salt (saline) solution to remove the lining of the uterus. °· Microwave - This method uses high-energy microwaves to heat up the lining of the uterus to remove it. °· Thermal balloon - This method involves inserting a catheter with a balloon tip into the uterus. The balloon tip is filled with heated fluid to remove the lining of the uterus. °AFTER THE PROCEDURE  °After your procedure, do   not have sexual intercourse or insert anything into your vagina until permitted by your health care provider. After the procedure, you may experience: °· Cramps. °· Vaginal discharge. °· Frequent urination. °  °This information is not intended to replace advice given to you by your health care provider. Make sure you discuss any  questions you have with your health care provider. °  °Document Released: 03/22/2004 Document Revised: 02/01/2015 Document Reviewed: 10/14/2012 °Elsevier Interactive Patient Education ©2016 [employer] ° °

## 2015-12-31 ENCOUNTER — Encounter: Payer: Self-pay | Admitting: Gynecology

## 2016-01-05 ENCOUNTER — Encounter: Payer: Self-pay | Admitting: Gynecology

## 2016-01-05 ENCOUNTER — Ambulatory Visit (INDEPENDENT_AMBULATORY_CARE_PROVIDER_SITE_OTHER): Payer: 59 | Admitting: Gynecology

## 2016-01-05 VITALS — BP 120/74

## 2016-01-05 DIAGNOSIS — N898 Other specified noninflammatory disorders of vagina: Secondary | ICD-10-CM | POA: Diagnosis not present

## 2016-01-05 DIAGNOSIS — Z9889 Other specified postprocedural states: Secondary | ICD-10-CM

## 2016-01-05 DIAGNOSIS — R350 Frequency of micturition: Secondary | ICD-10-CM | POA: Diagnosis not present

## 2016-01-05 LAB — WET PREP FOR TRICH, YEAST, CLUE
TRICH WET PREP: NONE SEEN
YEAST WET PREP: NONE SEEN

## 2016-01-05 LAB — URINALYSIS W MICROSCOPIC + REFLEX CULTURE
Bilirubin Urine: NEGATIVE
Casts: NONE SEEN [LPF]
Crystals: NONE SEEN [HPF]
Glucose, UA: NEGATIVE
HGB URINE DIPSTICK: NEGATIVE
Ketones, ur: NEGATIVE
LEUKOCYTES UA: NEGATIVE
NITRITE: NEGATIVE
PH: 5.5 (ref 5.0–8.0)
Protein, ur: NEGATIVE
RBC / HPF: NONE SEEN RBC/HPF (ref <=2)
Specific Gravity, Urine: 1.01 (ref 1.001–1.035)
WBC, UA: NONE SEEN WBC/HPF (ref <=5)
YEAST: NONE SEEN [HPF]

## 2016-01-05 MED ORDER — FLUCONAZOLE 150 MG PO TABS
150.0000 mg | ORAL_TABLET | Freq: Once | ORAL | 0 refills | Status: AC
Start: 2016-01-05 — End: 2016-01-05

## 2016-01-05 MED ORDER — METRONIDAZOLE 500 MG PO TABS: 500.0000 mg | ORAL_TABLET | Freq: Two times a day (BID) | ORAL | 0 refills | Status: DC

## 2016-01-05 MED FILL — OMEPRAZOLE DR 40 MG CAPSULE: 40 | 30 days supply | Qty: 30 | Fill #2

## 2016-01-05 MED FILL — TOPIRAMATE 100 MG TABLET: 100 | 30 days supply | Qty: 45 | Fill #1

## 2016-01-05 MED FILL — FLUCONAZOLE 150 MG TABLET: 150 | 1 days supply | Qty: 1 | Fill #0

## 2016-01-05 MED FILL — metroNIDAZOLE 500 MG TABS: 500 | 7 days supply | Qty: 14 | Fill #0

## 2016-01-05 NOTE — Patient Instructions (Signed)
Take the metronidazole antibiotic pill twice daily for 7 days Take the Diflucan pill once Follow up if your symptoms persist or recur.

## 2016-01-05 NOTE — Progress Notes (Signed)
    Penny Thomas 09-18-1985 [id number]        30 y.o.  Penny Thomas presents for her postoperative visit status post [name] endometrial ablation 27 July. Notes of the last week urinary frequency with some intermittent dysuria. No low back pain, fever chills nausea vomiting diarrhea constipation. No vaginal discharge irritation or odor.  Past medical history,surgical history, problem list, medications, allergies, family history and social history were all reviewed and documented in the EPIC chart.  Directed ROS with pertinent positives and negatives documented in the history of present illness/assessment and plan.  Exam: [name] assistant Vitals:   01/05/16 0828  BP: 120/74   General appearance:  Normal Abdomen soft nontender without masses guarding rebound Pelvic external BUS vagina with thick white discharge. Cervix normal. Uterus normal size midline mobile nontender. Adnexa without masses or tenderness  Assessment/Plan:  30 y.o. G3P3 with urinary frequency and intermittent dysuria. Urinalysis does show moderate bacteriuria but negative WBC or RBC. Clue cells also noted. We'll culture and treat if positive. Wet prep is consistent with bacterial vaginosis. Will treat with Flagyl 500 mg twice a day 7 days, alcohol points reviewed. Will also cover for yeast based on clinical appearance with Diflucan 150 mg 1 dose. She will follow up if symptoms persist or recur. Otherwise I reminded her to follow up in January when she is due for her annual exam.    Anastasio Auerbach MD, 8:50 AM 01/05/2016

## 2016-01-07 LAB — URINE CULTURE

## 2016-02-09 ENCOUNTER — Other Ambulatory Visit: Payer: Self-pay

## 2016-02-09 ENCOUNTER — Encounter: Payer: Self-pay | Admitting: Family

## 2016-02-09 MED ORDER — TOPIRAMATE 100 MG PO TABS: 150.0000 mg | ORAL_TABLET | Freq: Every day | ORAL | 1 refills | Status: DC

## 2016-02-11 MED ORDER — OMEPRAZOLE 40 MG PO CPDR: 40.0000 mg | DELAYED_RELEASE_CAPSULE | Freq: Every day | ORAL | 0 refills | Status: DC

## 2016-02-15 MED FILL — TOPIRAMATE 100 MG TABLET: 100 | 30 days supply | Qty: 45 | Fill #0

## 2016-02-15 MED FILL — OMEPRAZOLE DR 40 MG CAPSULE: 40 | 90 days supply | Qty: 90 | Fill #0

## 2016-03-12 ENCOUNTER — Ambulatory Visit (INDEPENDENT_AMBULATORY_CARE_PROVIDER_SITE_OTHER): Payer: PRIVATE HEALTH INSURANCE | Admitting: Neurology

## 2016-03-12 ENCOUNTER — Encounter: Payer: Self-pay | Admitting: Neurology

## 2016-03-12 ENCOUNTER — Other Ambulatory Visit (INDEPENDENT_AMBULATORY_CARE_PROVIDER_SITE_OTHER): Payer: PRIVATE HEALTH INSURANCE

## 2016-03-12 VITALS — HR 81 | Ht 67.0 in | Wt 159.0 lb

## 2016-03-12 DIAGNOSIS — G43009 Migraine without aura, not intractable, without status migrainosus: Secondary | ICD-10-CM | POA: Diagnosis not present

## 2016-03-12 DIAGNOSIS — Z79899 Other long term (current) drug therapy: Secondary | ICD-10-CM

## 2016-03-12 LAB — BASIC METABOLIC PANEL
BUN: 13 mg/dL (ref 6–23)
CALCIUM: 9.5 mg/dL (ref 8.4–10.5)
CO2: 23 mEq/L (ref 19–32)
CREATININE: 0.94 mg/dL (ref 0.40–1.20)
Chloride: 110 mEq/L (ref 96–112)
GFR: 89.68 mL/min (ref >60.00)
Glucose, Bld: 110 mg/dL — ABNORMAL HIGH (ref 70–99)
Potassium: 4 mEq/L (ref 3.5–5.1)
Sodium: 141 mEq/L (ref 135–145)

## 2016-03-12 MED ORDER — TOPIRAMATE 100 MG PO TABS: 150.0000 mg | ORAL_TABLET | Freq: Every day | ORAL | 1 refills | Status: DC

## 2016-03-12 MED ORDER — TOPIRAMATE 100 MG PO TABS
150.0000 mg | ORAL_TABLET | Freq: Every day | ORAL | 1 refills | Status: DC
Start: 2016-03-12 — End: 2016-03-12

## 2016-03-12 NOTE — Patient Instructions (Addendum)
Continue topiramate Will check another baseline BMP Follow up in 6 months

## 2016-03-12 NOTE — Progress Notes (Signed)
NEUROLOGY FOLLOW UP OFFICE NOTE  MIRRAH STRIANO [id number]  HISTORY OF PRESENT ILLNESS: Penny Thomas is a 30 year old right-handed female who follows up for migraine  UPDATE: Headaches are much improved.  She is feeling much better. Intensity:  8/10 Duration:  3-4 hours Frequency:  2 to 3 days per month Current NSAIDS:  Aleve Current analgesics:  no Current triptans:  no Current anti-nausea:  none Current muscle relaxants:  none Antihypertensive medications:  none Antidepressant medications:  none Anticonvulsant medications:  topiramate 150mg  Vitamins/Herbal/Supplements:  Biotin Antihistamines/Decongestants:  none Other therapy:  none  HISTORY: Onset:  30 years old Location:  Varies (bi-frontal, back of head) Quality:  Squeezing/"feels like I am hit in the head with a hammer" Intensity:  9-10/10 Aura:  no Prodrome:  no Associated symptoms:  Nausea, dizziness, photophobia, osmophobia Duration:  1 to 3 days Frequency:  3 to 4 days per week (16 headache days per week) Triggers/exacerbating factors:  Menstrual cycle, caffeine, scents (perfume, deoderant, hand sanitizer) Relieving factors:  Laying down Activity:  Lays down if severe  Past NSAIDS:  naproxen 500mg , Mobic, ibuprofen 800mg  Past analgesics:  Hydrocodone-acetaminophen, tramadol 50mg , Roxicet Past abortive triptans:  sumatriptan 100mg , Maxalt 10mg , Relpax 20mg  Past muscle relaxants:  Flexeril Past anti-nausea:  Zofran 4mg , promethazine 25mg  Past antihypertensive medications:  no Past antidepressant medications:  amitriptyline 50mg  (weight gain) Past anticonvulsant medications:  no Past vitamins/Herbal/Supplements:  no Past antihistamines/decongestants:  no Other past medications:  no  Caffeine:  no Alcohol:  seldom Smoker:  no Diet:  Good, water Exercise:  Not routine Depression/stress:  no Sleep hygiene:  Usually good Family history of headache:  Mom, sister, grandmother  PAST  MEDICAL HISTORY: Past Medical History:  Diagnosis Date  . Allergy   . Anemia    takes iron supplement  . Asthma   . Asthma    prn inhaler  . Blood transfusion 2007  . Blood transfusion 2010  . Blood transfusion without reported diagnosis   . 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 Hemorrhage in uterus 01-20-09  . Migraines   . 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 Seasonal allergies   . Sickle cell anemia (HCC)    trait  . Sickle cell trait [facility])     MEDICATIONS: Current Outpatient Prescriptions on File Prior to Visit  Medication Sig Dispense Refill  . albuterol (PROVENTIL HFA;VENTOLIN HFA) 108 (90 BASE) MCG/ACT inhaler Inhale 1-2 puffs into the lungs every 6 (six) hours as needed for wheezing or shortness of breath. 1 Inhaler 0  . Biotin 1000 MCG tablet Take 2,000 mcg by mouth 3 (three) times daily.    Marland Kitchen ibuprofen (ADVIL,MOTRIN) 800 MG tablet Take 1 tablet (800 mg total) by mouth 3 (three) times daily. 21 tablet 0  . omeprazole (PRILOSEC) 40 MG capsule Take 1 capsule (40 mg total) by mouth daily. 90 capsule 0  . oxyCODONE-acetaminophen (ROXICET) 5-325 MG tablet Take 1 tablet by mouth every 8 (eight) hours as needed for severe pain. 25 tablet 0   No current facility-administered medications on file prior to visit.     ALLERGIES: No Known Allergies  FAMILY HISTORY: Family History  Problem Relation Age of Onset  . Endometriosis Sister   . Fibromyalgia Mother   . Multiple sclerosis Mother   . Asthma Father   . Healthy Maternal Grandmother   . Healthy Maternal Grandfather   . Healthy Paternal Grandmother   . Healthy Paternal Grandfather     SOCIAL HISTORY:  Social History   Social History  . Marital status: Married    Spouse name: N/A  . Number of children: 4  . Years of education: 14   Occupational History  . [employer]    Social History Main Topics  . Smoking status: Never Smoker  . Smokeless tobacco: Never Used  . Alcohol use No     Comment: seldom    . Drug use: No  . Sexual activity: Yes    Birth control/ protection: Surgical     Comment: TUBAL LIGATION, INTERCOURSE AGE 66, SEXUAL PARTNERS 5   Other Topics Concern  . Not on file   Social History Narrative   Fun: Baking, playing with the children.   Denies abuse and feels safe at home.         REVIEW OF SYSTEMS: Constitutional: No fevers, chills, or sweats, no generalized fatigue, change in appetite Eyes: No visual changes, double vision, eye pain Ear, nose and throat: No hearing loss, ear pain, nasal congestion, sore throat Cardiovascular: No chest pain, palpitations Respiratory:  No shortness of breath at rest or with exertion, wheezes GastrointestinaI: No nausea, vomiting, diarrhea, abdominal pain, fecal incontinence Genitourinary:  No dysuria, urinary retention or frequency Musculoskeletal:  No neck pain, back pain Integumentary: No rash, pruritus, skin lesions Neurological: as above Psychiatric: No depression, insomnia, anxiety Endocrine: No palpitations, fatigue, diaphoresis, mood swings, change in appetite, change in weight, increased thirst Hematologic/Lymphatic:  No purpura, petechiae. Allergic/Immunologic: no itchy/runny eyes, nasal congestion, recent allergic reactions, rashes  PHYSICAL EXAM: Vitals:   03/12/16 0743  Pulse: 81   General: No acute distress.  Patient appears well-groomed.   Head:  Normocephalic/atraumatic Eyes:  Fundi examined but not visualized Neck: supple, no paraspinal tenderness, full range of motion Heart:  Regular rate and rhythm Lungs:  Clear to auscultation bilaterally Back: No paraspinal tenderness Neurological Exam: alert and oriented to person, place, and time. Attention span and concentration intact, recent and remote memory intact, fund of knowledge intact.  Speech fluent and not dysarthric, language intact.  CN II-XII intact. Bulk and tone normal, muscle strength 5/5 throughout.  Sensation to light touch  intact.  Deep tendon  reflexes 2+ throughout.  Finger to nose testing intact.  Gait normal  IMPRESSION: Migraine without aura  PLAN: 1.  Topiramate 150mg  at bedtime.  Check BMP. 2.  Aleve for abortive therapy 3.  Follow up in 6 months.  15 minutes spent face to face with patient, over 50% spent counseling.  Metta Clines, DO  CC:  Mauricio Po, FNP

## 2016-03-12 NOTE — Addendum Note (Signed)
Addended by: Gerda Diss A on: 03/12/2016 09:13 AM   Modules accepted: Orders

## 2016-03-15 ENCOUNTER — Other Ambulatory Visit: Payer: Self-pay

## 2016-03-15 MED ORDER — TOPIRAMATE 100 MG PO TABS: 150.0000 mg | ORAL_TABLET | Freq: Every day | ORAL | 1 refills | Status: DC

## 2016-03-15 MED FILL — TOPIRAMATE 100 MG TABLET: 100 | 30 days supply | Qty: 45 | Fill #1

## 2016-03-19 ENCOUNTER — Ambulatory Visit (INDEPENDENT_AMBULATORY_CARE_PROVIDER_SITE_OTHER): Payer: PRIVATE HEALTH INSURANCE

## 2016-03-19 DIAGNOSIS — Z23 Encounter for immunization: Secondary | ICD-10-CM | POA: Diagnosis not present

## 2016-04-02 ENCOUNTER — Telehealth: Payer: 59 | Admitting: Family

## 2016-04-02 DIAGNOSIS — M5441 Lumbago with sciatica, right side: Secondary | ICD-10-CM

## 2016-04-02 MED ORDER — NAPROXEN 500 MG PO TABS: 500.0000 mg | ORAL_TABLET | Freq: Two times a day (BID) | ORAL | 0 refills | Status: DC

## 2016-04-02 MED ORDER — CYCLOBENZAPRINE HCL 10 MG PO TABS: 10.0000 mg | ORAL_TABLET | Freq: Three times a day (TID) | ORAL | 0 refills | Status: DC | PRN

## 2016-04-02 MED FILL — CYCLOBENZAPRINE 10 MG TAB: 10 | 10 days supply | Qty: 30 | Fill #0

## 2016-04-02 MED FILL — NAPROXEN 500 MG TABLET: 500 | 15 days supply | Qty: 30 | Fill #0

## 2016-04-02 NOTE — Progress Notes (Signed)

## 2016-04-24 MED FILL — predniSONE 10 MG (21) TBPK: 10 | 6 days supply | Qty: 21 | Fill #0

## 2016-04-24 MED FILL — FLUTICASONE PROP 50 MCG SPR: 50 | 30 days supply | Qty: 16 | Fill #0

## 2016-04-24 MED FILL — BENZONATATE 200 MG CAPSULE: 200 | 10 days supply | Qty: 30 | Fill #0

## 2016-04-24 MED FILL — VENTOLIN HFA 90 MCG INHALER: 108 (90 BAS | 25 days supply | Qty: 18 | Fill #0

## 2016-05-03 ENCOUNTER — Telehealth: Payer: 59 | Admitting: Nurse Practitioner

## 2016-05-03 DIAGNOSIS — R059 Cough, unspecified: Secondary | ICD-10-CM

## 2016-05-03 DIAGNOSIS — R05 Cough: Secondary | ICD-10-CM

## 2016-05-03 MED ORDER — AZITHROMYCIN 250 MG PO TABS: ORAL_TABLET | ORAL | 0 refills | Status: DC

## 2016-05-03 MED FILL — AZITHROMYCIN 250 MG TABLET: 250 | 5 days supply | Qty: 6 | Fill #0

## 2016-05-03 NOTE — Progress Notes (Signed)

## 2016-05-30 ENCOUNTER — Encounter: Payer: Self-pay | Admitting: Neurology

## 2016-05-30 MED ORDER — PROPRANOLOL HCL ER 60 MG PO CP24: 60.0000 mg | ORAL_CAPSULE | Freq: Every day | ORAL | 2 refills | Status: DC

## 2016-05-30 MED FILL — PROPRANOLOL ER 60 MG CAPSUL: 60 | 30 days supply | Qty: 30 | Fill #0

## 2016-06-10 ENCOUNTER — Encounter: Payer: Self-pay | Admitting: Neurology

## 2016-06-11 ENCOUNTER — Telehealth: Payer: Self-pay | Admitting: Neurology

## 2016-06-11 ENCOUNTER — Telehealth: Payer: Self-pay | Admitting: Family

## 2016-06-11 ENCOUNTER — Encounter: Payer: Self-pay | Admitting: Neurology

## 2016-06-11 MED ORDER — PREDNISONE 10 MG (21) PO TBPK: ORAL_TABLET | ORAL | 0 refills | Status: DC

## 2016-06-11 MED FILL — predniSONE 10 MG (21) TBPK: 10 | 6 days supply | Qty: 21 | Fill #0

## 2016-06-11 NOTE — Telephone Encounter (Signed)
Has had a migraine since Saturday. Took left over Relpax and  Excedrin Migraine that does not seem to alleviate. Having nausea, sensitive to light and smells.

## 2016-06-11 NOTE — Telephone Encounter (Signed)
complete

## 2016-06-11 NOTE — Telephone Encounter (Signed)
If she has a driver, she may come in for a headache cocktail (Toradol 60mg Lenard Galloway 25mg /Reglan 10mg )

## 2016-06-11 NOTE — Telephone Encounter (Signed)
Patient unable to make it to the office for cocktail. Requesting med sent in to [facility].

## 2016-06-11 NOTE — Telephone Encounter (Signed)
PT called and wants to know if Dr Tomi Likens can call her in something for her headache/Dawn CB# [phone]

## 2016-06-14 NOTE — Telephone Encounter (Signed)
Sent in prednisone pak per Dr. Tomi Likens. Called patient on 01/16 and made aware. Patient was grateful

## 2016-06-19 ENCOUNTER — Encounter: Payer: Self-pay | Admitting: Neurology

## 2016-06-28 ENCOUNTER — Ambulatory Visit (INDEPENDENT_AMBULATORY_CARE_PROVIDER_SITE_OTHER): Payer: PRIVATE HEALTH INSURANCE | Admitting: Women's Health

## 2016-06-28 ENCOUNTER — Encounter: Payer: Self-pay | Admitting: Women's Health

## 2016-06-28 ENCOUNTER — Telehealth: Payer: Self-pay | Admitting: Neurology

## 2016-06-28 VITALS — BP 104/70 | Ht 66.0 in | Wt 150.0 lb

## 2016-06-28 DIAGNOSIS — Z8632 Personal history of gestational diabetes: Secondary | ICD-10-CM

## 2016-06-28 DIAGNOSIS — R109 Unspecified abdominal pain: Secondary | ICD-10-CM | POA: Diagnosis not present

## 2016-06-28 DIAGNOSIS — Z1151 Encounter for screening for human papillomavirus (HPV): Secondary | ICD-10-CM

## 2016-06-28 DIAGNOSIS — Z01419 Encounter for gynecological examination (general) (routine) without abnormal findings: Secondary | ICD-10-CM | POA: Diagnosis not present

## 2016-06-28 DIAGNOSIS — N946 Dysmenorrhea, unspecified: Secondary | ICD-10-CM | POA: Diagnosis not present

## 2016-06-28 DIAGNOSIS — R10A2 Flank pain, left side: Secondary | ICD-10-CM

## 2016-06-28 DIAGNOSIS — Z13220 Encounter for screening for lipoid disorders: Secondary | ICD-10-CM | POA: Diagnosis not present

## 2016-06-28 LAB — CBC WITH DIFFERENTIAL/PLATELET
BASOS ABS: 0 {cells}/uL (ref 0–200)
Basophils Relative: 0 %
EOS ABS: 47 {cells}/uL (ref 15–500)
Eosinophils Relative: 1 %
HCT: 39.8 % (ref 35.0–45.0)
Hemoglobin: 13.2 g/dL (ref 11.7–15.5)
LYMPHS PCT: 35 %
Lymphs Abs: 1645 cells/uL (ref 850–3900)
MCH: 29.3 pg (ref 27.0–33.0)
MCHC: 33.2 g/dL (ref 32.0–36.0)
MCV: 88.2 fL (ref 80.0–100.0)
MONOS PCT: 8 %
MPV: 10.2 fL (ref 7.5–12.5)
Monocytes Absolute: 376 cells/uL (ref 200–950)
NEUTROS PCT: 56 %
Neutro Abs: 2632 cells/uL (ref 1500–7800)
Platelets: 254 10*3/uL (ref 140–400)
RBC: 4.51 MIL/uL (ref 3.80–5.10)
RDW: 14.5 % (ref 11.0–15.0)
WBC: 4.7 10*3/uL (ref 3.8–10.8)

## 2016-06-28 LAB — LIPID PANEL
Cholesterol: 187 mg/dL (ref <200)
HDL: 72 mg/dL (ref >50)
LDL CALC: 92 mg/dL (ref <100)
Total CHOL/HDL Ratio: 2.6 Ratio (ref <5.0)
Triglycerides: 114 mg/dL (ref <150)
VLDL: 23 mg/dL (ref <30)

## 2016-06-28 LAB — HEMOGLOBIN A1C
HEMOGLOBIN A1C: 5.1 % (ref <5.7)
Mean Plasma Glucose: 100 mg/dL

## 2016-06-28 MED ORDER — OXYCODONE-ACETAMINOPHEN 5-325 MG PO TABS: 1.0000 | ORAL_TABLET | Freq: Three times a day (TID) | ORAL | 0 refills | Status: DC | PRN

## 2016-06-28 MED FILL — PROPRANOLOL ER 60 MG CAP: 60 | 30 days supply | Qty: 30 | Fill #1

## 2016-06-28 NOTE — Progress Notes (Signed)
Penny Thomas [birth year] [id number]    History:    Presents for annual exam.  2013 ablation with good relief of dysmenorrhea, rare bleeding. Continues to have several days of severe cramping at time of cycle, uses 2-3 Roxicet at that time. BTL. Migraines without aura neurologist managing. History of GDM. Sickle cell trait. Normal Pap history.  Past medical history, past surgical history, family history and social history were all reviewed and documented in the EPIC chart. Has 4 boys ages 59 twins 32 and the 92-year-old. Mother type 2 diabetes. Works at [employer].  ROS:  A ROS was performed and pertinent positives and negatives are included.  Exam:  Vitals:   06/28/16 0831  BP: 104/70  Weight: 150 lb (68 kg)  Height: 5\' 6"  (1.676 m)   Body mass index is 24.21 kg/m.   General appearance:  Normal Thyroid:  Symmetrical, normal in size, without palpable masses or nodularity. Respiratory  Auscultation:  Clear without wheezing or rhonchi Cardiovascular  Auscultation:  Regular rate, without rubs, murmurs or gallops  Edema/varicosities:  Not grossly evident Abdominal  Soft,nontender, without masses, guarding or rebound.  Liver/spleen:  No organomegaly noted  Hernia:  None appreciated  Skin  Inspection:  Grossly normal   Breasts: Examined lying and sitting.     Right: Without masses, retractions, discharge or axillary adenopathy.     Left: Without masses, retractions, discharge or axillary adenopathy. Gentitourinary   Inguinal/mons:  Normal without inguinal adenopathy  External genitalia:  Normal  BUS/Urethra/Skene's glands:  Normal  Vagina:  Normal  Cervix:  Normal  Uterus:   normal in size, shape and contour.  Midline and mobile  Adnexa/parametria:     Rt: Without masses or tenderness.   Lt: Without masses or tenderness.  Anus and perineum: Normal  Digital rectal exam: Normal sphincter tone without palpated masses or tenderness  Assessment/Plan:  31 y.o. MBF  G3 P4 for annual exawith no GYN complaints.  2013 ablation/ good relief of dysmenorrhea, now 1-2 days of cramping with rare bleeding BTL Migraines-neurologist manages History of GDM  Plan: Prescription for Roxicet given, uses several per month, aware of addictive properties into use sparingly. CBC, hemoglobin A1c, lipid panel, Pap with HR HPV typing, new screening guidelines reviewed. SBE's, exercise, calcium rich diet, MVI daily encouraged. Continue follow-up with neurologist, reports 3 migraines last month.    [place], 9:02 AM 06/28/2016

## 2016-06-28 NOTE — Telephone Encounter (Signed)
Penny Thomas [birth year]. She is taking Propranolol. The pharmacy told her she has to do the mail order with it. She didn't know. Can Dr. Tomi Likens send the prescription into them? Her # [id number]. Thank you

## 2016-06-28 NOTE — Patient Instructions (Signed)
Health Maintenance, Female Introduction Adopting a healthy lifestyle and getting preventive care can go a long way to promote health and wellness. Talk with your health care provider about what schedule of regular examinations is right for you. This is a good chance for you to check in with your provider about disease prevention and staying healthy. In between checkups, there are plenty of things you can do on your own. Experts have done a lot of research about which lifestyle changes and preventive measures are most likely to keep you healthy. Ask your health care provider for more information. Weight and diet Eat a healthy diet  Be sure to include plenty of vegetables, fruits, low-fat dairy products, and lean protein.  Do not eat a lot of foods high in solid fats, added sugars, or salt.  Get regular exercise. This is one of the most important things you can do for your health.  Most adults should exercise for at least 150 minutes each week. The exercise should increase your heart rate and make you sweat (moderate-intensity exercise).  Most adults should also do strengthening exercises at least twice a week. This is in addition to the moderate-intensity exercise. Maintain a healthy weight  Body mass index (BMI) is a measurement that can be used to identify possible weight problems. It estimates body fat based on height and weight. Your health care provider can help determine your BMI and help you achieve or maintain a healthy weight.  For females 61 years of age and older:  A BMI below 18.5 is considered underweight.  A BMI of 18.5 to 24.9 is normal.  A BMI of 25 to 29.9 is considered overweight.  A BMI of 30 and above is considered obese. Watch levels of cholesterol and blood lipids  You should start having your blood tested for lipids and cholesterol at 31 years of age, then have this test every 5 years.  You may need to have your cholesterol levels checked more often if:  Your  lipid or cholesterol levels are high.  You are older than 31 years of age.  You are at high risk for heart disease. Cancer screening Lung Cancer  Lung cancer screening is recommended for adults 57-48 years old who are at high risk for lung cancer because of a history of smoking.  A yearly low-dose CT scan of the lungs is recommended for people who:  Currently smoke.  Have quit within the past 15 years.  Have at least a 30-pack-year history of smoking. A pack year is smoking an average of one pack of cigarettes a day for 1 year.  Yearly screening should continue until it has been 15 years since you quit.  Yearly screening should stop if you develop a health problem that would prevent you from having lung cancer treatment. Breast Cancer  Practice breast self-awareness. This means understanding how your breasts normally appear and feel.  It also means doing regular breast self-exams. Let your health care provider know about any changes, no matter how small.  If you are in your 20s or 30s, you should have a clinical breast exam (CBE) by a health care provider every 1-3 years as part of a regular health exam.  If you are 50 or older, have a CBE every year. Also consider having a breast X-ray (mammogram) every year.  If you have a family history of breast cancer, talk to your health care provider about genetic screening.  If you are at high risk for breast cancer,  talk to your health care provider about having an MRI and a mammogram every year.  Breast cancer gene (BRCA) assessment is recommended for women who have family members with BRCA-related cancers. BRCA-related cancers include:  Breast.  Ovarian.  Tubal.  Peritoneal cancers.  Results of the assessment will determine the need for genetic counseling and BRCA1 and BRCA2 testing. Cervical Cancer  Your health care provider may recommend that you be screened regularly for cancer of the pelvic organs (ovaries, uterus, and  vagina). This screening involves a pelvic examination, including checking for microscopic changes to the surface of your cervix (Pap test). You may be encouraged to have this screening done every 3 years, beginning at age 21.  For women ages 30-65, health care providers may recommend pelvic exams and Pap testing every 3 years, or they may recommend the Pap and pelvic exam, combined with testing for human papilloma virus (HPV), every 5 years. Some types of HPV increase your risk of cervical cancer. Testing for HPV may also be done on women of any age with unclear Pap test results.  Other health care providers may not recommend any screening for nonpregnant women who are considered low risk for pelvic cancer and who do not have symptoms. Ask your health care provider if a screening pelvic exam is right for you.  If you have had past treatment for cervical cancer or a condition that could lead to cancer, you need Pap tests and screening for cancer for at least 20 years after your treatment. If Pap tests have been discontinued, your risk factors (such as having a new sexual partner) need to be reassessed to determine if screening should resume. Some women have medical problems that increase the chance of getting cervical cancer. In these cases, your health care provider may recommend more frequent screening and Pap tests. Colorectal Cancer  This type of cancer can be detected and often prevented.  Routine colorectal cancer screening usually begins at 31 years of age and continues through 31 years of age.  Your health care provider may recommend screening at an earlier age if you have risk factors for colon cancer.  Your health care provider may also recommend using home test kits to check for hidden blood in the stool.  A small camera at the end of a tube can be used to examine your colon directly (sigmoidoscopy or colonoscopy). This is done to check for the earliest forms of colorectal  cancer.  Routine screening usually begins at age 50.  Direct examination of the colon should be repeated every 5-10 years through 31 years of age. However, you may need to be screened more often if early forms of precancerous polyps or small growths are found. Skin Cancer  Check your skin from head to toe regularly.  Tell your health care provider about any new moles or changes in moles, especially if there is a change in a mole's shape or color.  Also tell your health care provider if you have a mole that is larger than the size of a pencil eraser.  Always use sunscreen. Apply sunscreen liberally and repeatedly throughout the day.  Protect yourself by wearing long sleeves, pants, a wide-brimmed hat, and sunglasses whenever you are outside. Heart disease, diabetes, and high blood pressure  High blood pressure causes heart disease and increases the risk of stroke. High blood pressure is more likely to develop in:  People who have blood pressure in the high end of the normal range (130-139/85-89 mm Hg).    People who are overweight or obese.  People who are African American.  If you are 65-65 years of age, have your blood pressure checked every 3-5 years. If you are 60 years of age or older, have your blood pressure checked every year. You should have your blood pressure measured twice-once when you are at a hospital or clinic, and once when you are not at a hospital or clinic. Record the average of the two measurements. To check your blood pressure when you are not at a hospital or clinic, you can use:  An automated blood pressure machine at a pharmacy.  A home blood pressure monitor.  If you are between 58 years and 46 years old, ask your health care provider if you should take aspirin to prevent strokes.  Have regular diabetes screenings. This involves taking a blood sample to check your fasting blood sugar level.  If you are at a normal weight and have a low risk for diabetes,  have this test once every three years after 31 years of age.  If you are overweight and have a high risk for diabetes, consider being tested at a younger age or more often. Preventing infection Hepatitis B  If you have a higher risk for hepatitis B, you should be screened for this virus. You are considered at high risk for hepatitis B if:  You were born in a country where hepatitis B is common. Ask your health care provider which countries are considered high risk.  Your parents were born in a high-risk country, and you have not been immunized against hepatitis B (hepatitis B vaccine).  You have HIV or AIDS.  You use needles to inject street drugs.  You live with someone who has hepatitis B.  You have had sex with someone who has hepatitis B.  You get hemodialysis treatment.  You take certain medicines for conditions, including cancer, organ transplantation, and autoimmune conditions. Hepatitis C  Blood testing is recommended for:  Everyone born from [birth year] through 1965.  Anyone with known risk factors for hepatitis C. Sexually transmitted infections (STIs)  You should be screened for sexually transmitted infections (STIs) including gonorrhea and chlamydia if:  You are sexually active and are younger than 31 years of age.  You are older than 31 years of age and your health care provider tells you that you are at risk for this type of infection.  Your sexual activity has changed since you were last screened and you are at an increased risk for chlamydia or gonorrhea. Ask your health care provider if you are at risk.  If you do not have HIV, but are at risk, it may be recommended that you take a prescription medicine daily to prevent HIV infection. This is called pre-exposure prophylaxis (PrEP). You are considered at risk if:  You are sexually active and do not regularly use condoms or know the HIV status of your partner(s).  You take drugs by injection.  You are sexually  active with a partner who has HIV. Talk with your health care provider about whether you are at high risk of being infected with HIV. If you choose to begin PrEP, you should first be tested for HIV. You should then be tested every 3 months for as long as you are taking PrEP. Pregnancy  If you are premenopausal and you may become pregnant, ask your health care provider about preconception counseling.  If you may become pregnant, take 400 to 800 micrograms (mcg) of folic acid  every day.  If you want to prevent pregnancy, talk to your health care provider about birth control (contraception). Osteoporosis and menopause  Osteoporosis is a disease in which the bones lose minerals and strength with aging. This can result in serious bone fractures. Your risk for osteoporosis can be identified using a bone density scan.  If you are 65 years of age or older, or if you are at risk for osteoporosis and fractures, ask your health care provider if you should be screened.  Ask your health care provider whether you should take a calcium or vitamin D supplement to lower your risk for osteoporosis.  Menopause may have certain physical symptoms and risks.  Hormone replacement therapy may reduce some of these symptoms and risks. Talk to your health care provider about whether hormone replacement therapy is right for you. Follow these instructions at home:  Schedule regular health, dental, and eye exams.  Stay current with your immunizations.  Do not use any tobacco products including cigarettes, chewing tobacco, or electronic cigarettes.  If you are pregnant, do not drink alcohol.  If you are breastfeeding, limit how much and how often you drink alcohol.  Limit alcohol intake to no more than 1 drink per day for nonpregnant women. One drink equals 12 ounces of beer, 5 ounces of wine, or 1 ounces of hard liquor.  Do not use street drugs.  Do not share needles.  Ask your health care provider for  help if you need support or information about quitting drugs.  Tell your health care provider if you often feel depressed.  Tell your health care provider if you have ever been abused or do not feel safe at home. This information is not intended to replace advice given to you by your health care provider. Make sure you discuss any questions you have with your health care provider. Document Released: 11/26/2010 Document Revised: 10/19/2015 Document Reviewed: 02/14/2015  2017 Elsevier   Carbohydrate Counting for Diabetes Mellitus, Adult Carbohydrate counting is a method for keeping track of how many carbohydrates you eat. Eating carbohydrates naturally increases the amount of sugar (glucose) in the blood. Counting how many carbohydrates you eat helps keep your blood glucose within normal limits, which helps you manage your diabetes (diabetes mellitus). It is important to know how many carbohydrates you can safely have in each meal. This is different for every person. A diet and nutrition specialist (registered dietitian) can help you make a meal plan and calculate how many carbohydrates you should have at each meal and snack. Carbohydrates are found in the following foods:  Grains, such as breads and cereals.  Dried beans and soy products.  Starchy vegetables, such as potatoes, peas, and corn.  Fruit and fruit juices.  Milk and yogurt.  Sweets and snack foods, such as cake, cookies, candy, chips, and soft drinks. How do I count carbohydrates? There are two ways to count carbohydrates in food. You can use either of the methods or a combination of both. Reading "Nutrition Facts" on packaged food  The "Nutrition Facts" list is included on the labels of almost all packaged foods and beverages in the [place] It includes:  The serving size.  Information about nutrients in each serving, including the grams (g) of carbohydrate per serving. To use the "Nutrition Facts":  Decide how many  servings you will have.  Multiply the number of servings by the number of carbohydrates per serving.  The resulting number is the total amount of carbohydrates that you   will be having. Learning standard serving sizes of other foods  When you eat foods containing carbohydrates that are not packaged or do not include "Nutrition Facts" on the label, you need to measure the servings in order to count the amount of carbohydrates:  Measure the foods that you will eat with a food scale or measuring cup, if needed.  Decide how many standard-size servings you will eat.  Multiply the number of servings by 15. Most carbohydrate-rich foods have about 15 g of carbohydrates per serving.  For example, if you eat 8 oz (170 g) of strawberries, you will have eaten 2 servings and 30 g of carbohydrates (2 servings x 15 g = 30 g).  For foods that have more than one food mixed, such as soups and casseroles, you must count the carbohydrates in each food that is included. The following list contains standard serving sizes of common carbohydrate-rich foods. Each of these servings has about 15 g of carbohydrates:   hamburger bun or  English muffin.   oz (15 mL) syrup.   oz (14 g) jelly.  1 slice of bread.  1 six-inch tortilla.  3 oz (85 g) cooked rice or pasta.  4 oz (113 g) cooked dried beans.  4 oz (113 g) starchy vegetable, such as peas, corn, or potatoes.  4 oz (113 g) hot cereal.  4 oz (113 g) mashed potatoes or  of a large baked potato.  4 oz (113 g) canned or frozen fruit.  4 oz (120 mL) fruit juice.  4-6 crackers.  6 chicken nuggets.  6 oz (170 g) unsweetened dry cereal.  6 oz (170 g) plain fat-free yogurt or yogurt sweetened with artificial sweeteners.  8 oz (240 mL) milk.  8 oz (170 g) fresh fruit or one small piece of fruit.  24 oz (680 g) popped popcorn. Example of carbohydrate counting Sample meal  3 oz (85 g) chicken breast.  6 oz (170 g) brown rice.  4 oz (113  g) corn.  8 oz (240 mL) milk.  8 oz (170 g) strawberries with sugar-free whipped topping. Carbohydrate calculation 1. Identify the foods that contain carbohydrates:  Rice.  Corn.  Milk.  Strawberries. 2. Calculate how many servings you have of each food:  2 servings rice.  1 serving corn.  1 serving milk.  1 serving strawberries. 3. Multiply each number of servings by 15 g:  2 servings rice x 15 g = 30 g.  1 serving corn x 15 g = 15 g.  1 serving milk x 15 g = 15 g.  1 serving strawberries x 15 g = 15 g. 4. Add together all of the amounts to find the total grams of carbohydrates eaten:  30 g + 15 g + 15 g + 15 g = 75 g of carbohydrates total. This information is not intended to replace advice given to you by your health care provider. Make sure you discuss any questions you have with your health care provider. Document Released: 05/13/2005 Document Revised: 12/01/2015 Document Reviewed: 10/25/2015 Elsevier Interactive Patient Education  2017 Elsevier Inc.  

## 2016-06-30 ENCOUNTER — Encounter: Payer: Self-pay | Admitting: Women's Health

## 2016-07-01 MED ORDER — PROPRANOLOL HCL ER 60 MG PO CP24: 60.0000 mg | ORAL_CAPSULE | Freq: Every day | ORAL | 0 refills | Status: DC

## 2016-07-01 NOTE — Telephone Encounter (Signed)
Called patient and confirmed Rx to go to [facility]. Patient confirmed and was grateful.

## 2016-07-02 LAB — PAP, TP IMAGING W/ HPV RNA, RFLX HPV TYPE 16,18/45: HPV MRNA, HIGH RISK: NOT DETECTED

## 2016-08-20 MED FILL — SULFAMETHOXAZOLE/TMP DS TAB: 800-160 | 3 days supply | Qty: 6 | Fill #0

## 2016-08-24 ENCOUNTER — Other Ambulatory Visit: Payer: Self-pay | Admitting: Neurology

## 2016-09-11 ENCOUNTER — Encounter: Payer: Self-pay | Admitting: Neurology

## 2016-09-11 ENCOUNTER — Ambulatory Visit (INDEPENDENT_AMBULATORY_CARE_PROVIDER_SITE_OTHER): Payer: PRIVATE HEALTH INSURANCE | Admitting: Neurology

## 2016-09-11 VITALS — BP 100/70 | HR 60 | Ht 67.0 in | Wt 153.0 lb

## 2016-09-11 DIAGNOSIS — G43009 Migraine without aura, not intractable, without status migrainosus: Secondary | ICD-10-CM

## 2016-09-11 MED ORDER — NAPROXEN SODIUM 550 MG PO TABS: 550.0000 mg | ORAL_TABLET | Freq: Two times a day (BID) | ORAL | 8 refills | Status: DC | PRN

## 2016-09-11 NOTE — Patient Instructions (Signed)
1.  Stop the over the counter of Aleve.  When you get a migraine, instead take naproxen 550mg .  May repeat dose after 12 hours if needed. 2.  Continue propranolol ER 60mg  daily 3.  Follow up in 9 months.  Contact me sooner with any concerns.

## 2016-09-11 NOTE — Progress Notes (Signed)
NEUROLOGY FOLLOW UP OFFICE NOTE  Penny Thomas [id number]  HISTORY OF PRESENT ILLNESS: Penny Thomas is a 31 year old right-handed female who follows up for migraine   UPDATE: Intensity:  6/10 Duration:  2-3 hours if Aleve effective, 3-4 hours if not effective Frequency:  2 to 3 days per month Current NSAIDS:  Aleve (effective half the time) Current analgesics:  no Current triptans:  no Current anti-nausea:  none Current muscle relaxants:  none Antihypertensive medications:  propranolol ER 60mg  Antidepressant medications:  none Anticonvulsant medications:  none Vitamins/Herbal/Supplements:  Biotin Antihistamines/Decongestants:  none Other therapy:  none   HISTORY: Onset:  31 years old Location:  Varies (bi-frontal, back of head) Quality:  Squeezing/"feels like I am hit in the head with a hammer" Intensity:  9-10/10; October: 8/10 Aura:  no Prodrome:  no Associated symptoms:  Nausea, dizziness, photophobia, osmophobia Duration:  1 to 3 days; October: 3-4 hours Frequency:  3 to 4 days per week (16 headache days per week); October: 2 to 3 days per month Triggers/exacerbating factors:  Menstrual cycle, caffeine, scents (perfume, deoderant, hand sanitizer), change in weather Relieving factors:  Laying down Activity:  Lays down if severe   Past NSAIDS:  naproxen 500mg , Mobic, ibuprofen 800mg  Past analgesics:  Hydrocodone-acetaminophen, tramadol 50mg , Roxicet, Excedrin Migraine Past abortive triptans:  sumatriptan 100mg , Maxalt 10mg , Relpax 20mg  Past muscle relaxants:  Flexeril Past anti-nausea:  Zofran 4mg , promethazine 25mg  Past antihypertensive medications:  no Past antidepressant medications:  amitriptyline 50mg  (weight gain) Past anticonvulsant medications:  topiramate 150mg  (effective but caused hair loss) Past vitamins/Herbal/Supplements:  no Past antihistamines/decongestants:  no Other past medications:  no   Caffeine:  no Alcohol:   seldom Smoker:  no Depression/stress:  no Sleep hygiene:  Usually good Family history of headache:  Penny Thomas, Penny Thomas, Penny Thomas  PAST MEDICAL HISTORY: Past Medical History:  Diagnosis Date  . Allergy   . Anemia    takes iron supplement  . Asthma   . Asthma    prn inhaler  . Blood transfusion 2007  . Blood transfusion 2010  . Blood transfusion without reported diagnosis   . 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 Hemorrhage in uterus 01-20-09  . Migraines   . 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 Seasonal allergies   . Sickle cell anemia (HCC)    trait  . Sickle cell trait Penny Thomas)     MEDICATIONS: Current Outpatient Prescriptions on File Prior to Visit  Medication Sig Dispense Refill  . albuterol (PROVENTIL HFA;VENTOLIN HFA) 108 (90 BASE) MCG/ACT inhaler Inhale 1-2 puffs into the lungs every 6 (six) hours as needed for wheezing or shortness of breath. 1 Inhaler 0  . Biotin 1000 MCG tablet Take 2,000 mcg by mouth 3 (three) times daily.    . propranolol ER (INDERAL LA) 60 MG 24 hr capsule Take 1 capsule (60 mg total) by mouth daily. 90 capsule 0   No current facility-administered medications on file prior to visit.     ALLERGIES: No Known Allergies  FAMILY HISTORY: Family History  Problem Relation Age of Onset  . Fibromyalgia Penny Thomas   . Multiple sclerosis Penny Thomas   . Asthma Penny Thomas   . Healthy Penny Thomas   . Healthy Penny Thomas   . Healthy Penny Thomas   . Healthy Penny Thomas   . Endometriosis Penny Thomas     SOCIAL HISTORY: Social History   Social History  . Marital status: Married    Spouse name: N/A  . Number  of children: 4  . Years of education: 14   Occupational History  . [employer]    Social History Main Topics  . Smoking status: Never Smoker  . Smokeless tobacco: Never Used  . Alcohol use No     Comment: seldom  . Drug use: No  . Sexual activity: Yes    Birth control/ protection: Surgical     Comment: TUBAL  LIGATION, INTERCOURSE AGE 32, SEXUAL PARTNERS 5   Other Topics Concern  . Not on file   Social History Narrative   Fun: Baking, playing with the children.   Denies abuse and feels safe at home.         REVIEW OF SYSTEMS: Constitutional: No fevers, chills, or sweats, no generalized fatigue, change in appetite Eyes: No visual changes, double vision, eye pain Ear, nose and throat: No hearing loss, ear pain, nasal congestion, sore throat Cardiovascular: No chest pain, palpitations Respiratory:  No shortness of breath at rest or with exertion, wheezes GastrointestinaI: No nausea, vomiting, diarrhea, abdominal pain, fecal incontinence Genitourinary:  No dysuria, urinary retention or frequency Musculoskeletal:  No neck pain, back pain Integumentary: No rash, pruritus, skin lesions Neurological: as above Psychiatric: No depression, insomnia, anxiety Endocrine: No palpitations, fatigue, diaphoresis, mood swings, change in appetite, change in weight, increased thirst Hematologic/Lymphatic:  No purpura, petechiae. Allergic/Immunologic: no itchy/runny eyes, nasal congestion, recent allergic reactions, rashes  PHYSICAL EXAM: Vitals:   09/11/16 0740  BP: 100/70  Pulse: 60   General: No acute distress.  Patient appears well-groomed.  normal body habitus. Head:  Normocephalic/atraumatic Eyes:  Fundi examined but not visualized Neck: supple, no paraspinal tenderness, full range of motion Heart:  Regular rate and rhythm Lungs:  Clear to auscultation bilaterally Back: No paraspinal tenderness Neurological Exam: alert and oriented to person, place, and time. Attention span and concentration intact, recent and remote memory intact, fund of knowledge intact.  Speech fluent and not dysarthric, language intact.  CN II-XII intact. Bulk and tone normal, muscle strength 5/5 throughout.  Sensation to light touch  intact.  Deep tendon reflexes 2+ throughout.  Finger to nose testing intact.  Gait normal,  Romberg negative.  IMPRESSION: Migraine without aura, stable  PLAN: 1.  Continue propranolol ER 60mg  daily 2.  Instead of over the counter Aleve, will prescribe naproxen 550mg  to see if it is more effective 3.  Follow up in 9 months.  Metta Clines, DO  CC: Mauricio Po, FNP

## 2016-10-09 ENCOUNTER — Encounter: Payer: Self-pay | Admitting: Gynecology

## 2016-10-27 ENCOUNTER — Other Ambulatory Visit: Payer: Self-pay | Admitting: Neurology

## 2016-10-31 MED FILL — PROPRANOLOL ER 60 MG CAP: 60 | 30 days supply | Qty: 30 | Fill #2

## 2016-11-18 ENCOUNTER — Other Ambulatory Visit: Payer: Self-pay | Admitting: Neurology

## 2016-11-18 MED ORDER — PROPRANOLOL HCL ER 60 MG PO CP24: 60.0000 mg | ORAL_CAPSULE | Freq: Every day | ORAL | 1 refills | Status: DC

## 2016-11-18 NOTE — Telephone Encounter (Signed)
Called again, no answer

## 2016-11-18 NOTE — Telephone Encounter (Signed)
PT left a voicemail message asking Dr Tomi Likens fax over her prescription but did not say which prescription she was referring to

## 2016-11-18 NOTE — Telephone Encounter (Signed)
Patient left a voice mail that just stated that she needed a RX sent to a different  Pharmacy. She did not say where or what drug it was phone number is [phone]

## 2016-11-18 NOTE — Telephone Encounter (Signed)
Left message to call office. Unsure which pharmacy she would like refill sent to?

## 2016-11-18 NOTE — Telephone Encounter (Signed)
Patient states that for Propanolol she has to get it from another pharmacy for her insurance to pay for it. Patient provided this information:  Pharmacy name: [facility] Fax: [phone]

## 2016-12-10 ENCOUNTER — Telehealth: Payer: PRIVATE HEALTH INSURANCE | Admitting: Family

## 2016-12-10 DIAGNOSIS — J301 Allergic rhinitis due to pollen: Secondary | ICD-10-CM

## 2016-12-10 MED ORDER — PREDNISONE 10 MG (21) PO TBPK: ORAL_TABLET | ORAL | 0 refills | Status: DC

## 2016-12-10 MED FILL — predniSONE 10 MG (21) TBPK: 10 | 6 days supply | Qty: 21 | Fill #0

## 2016-12-10 NOTE — Progress Notes (Signed)
E visit for Allergic Rhinitis We are sorry that you are not feeling well.  Her is how we plan to help!  Based on what you have shared with me it looks like you have Allergic Rhinitis.  Rhinitis is when a reaction occurs that causes nasal congestion, runny nose, sneezing, and itching.  Most types of rhinitis are caused by an inflammation and are associated with symptoms in the eyes ears or throat. There are several types of rhinitis.  The most common are acute rhinitis, which is usually caused by a viral illness, allergic or seasonal rhinitis, and nonallergic or year-round rhinitis.  Nasal allergies occur certain times of the year.  Allergic rhinitis is caused when allergens in the air trigger the release of histamine in the body.  Histamine causes itching, swelling, and fluid to build up in the fragile linings of the nasal passages, sinuses and eyelids.  An itchy nose and clear discharge are common.   I would recommend a prescription of Prednisone to help control the symptoms. I have sent in a prescription to the pharmacy.   HOME CARE:   You can use an over-the-counter saline nasal spray as needed  Avoid areas where there is heavy dust, mites, or molds  Stay indoors on windy days during the pollen season  Keep windows closed in home, at least in bedroom; use air conditioner.  Use high-efficiency house air filter  Keep windows closed in car, turn AC on re-circulate  Avoid playing out with dog during pollen season  GET HELP RIGHT AWAY IF:   If your symptoms do not improve within 10 days  You become short of breath  You develop yellow or green discharge from your nose for over 3 days  You have coughing fits  MAKE SURE YOU:   Understand these instructions  Will watch your condition  Will get help right away if you are not doing well or get worse  Thank you for choosing an e-visit. Your e-visit answers were reviewed by a board certified advanced clinical practitioner to  complete your personal care plan. Depending upon the condition, your plan could have included both over the counter or prescription medications. Please review your pharmacy choice. Be sure that the pharmacy you have chosen is open so that you can pick up your prescription now.  If there is a problem you may message your provider in [place] to have the prescription routed to another pharmacy. Your safety is important to [place]. If you have drug allergies check your prescription carefully.  For the next 24 hours, you can use MyChart to ask questions about today's visit, request a non-urgent call back, or ask for a work or school excuse from your e-visit provider. You will get an email in the next two days asking about your experience. I hope that your e-visit has been valuable and will speed your recovery.

## 2016-12-11 ENCOUNTER — Encounter: Payer: Self-pay | Admitting: Family

## 2016-12-11 ENCOUNTER — Ambulatory Visit (INDEPENDENT_AMBULATORY_CARE_PROVIDER_SITE_OTHER): Payer: PRIVATE HEALTH INSURANCE | Admitting: Family

## 2016-12-11 VITALS — BP 112/70 | HR 65 | Temp 98.6°F | Resp 16 | Ht 67.0 in | Wt 159.1 lb

## 2016-12-11 DIAGNOSIS — J00 Acute nasopharyngitis [common cold]: Secondary | ICD-10-CM

## 2016-12-11 HISTORY — DX: Acute nasopharyngitis (common cold): J00

## 2016-12-11 MED ORDER — HYDROCODONE-HOMATROPINE 5-1.5 MG/5ML PO SYRP: 5.0000 mL | ORAL_SOLUTION | Freq: Three times a day (TID) | ORAL | 0 refills | Status: DC | PRN

## 2016-12-11 MED FILL — HYDROCODONE-HOMATROPINE SYR: 5-1.5 | 8 days supply | Qty: 120 | Fill #0

## 2016-12-11 NOTE — Patient Instructions (Signed)
Thank you for choosing [facility].  SUMMARY AND INSTRUCTIONS:  Please start the prednisone.   Consider Aleve-D as needed for congestion.  Start the Hycodan as needed for cough and sleep.   If your symptoms worsen please let us know.   Medication:  Your prescription(s) have been submitted to your pharmacy or been printed and provided for you. Please take as directed and contact our office if you believe you are having problem(s) with the medication(s) or have any questions.  Follow up:  If your symptoms worsen or fail to improve, please contact our office for further instruction, or in case of emergency go directly to the emergency room at the closest medical facility.   General Recommendations:    Please drink plenty of fluids.  Get plenty of rest   Sleep in humidified air  Use saline nasal sprays  Netti pot   OTC Medications:  Decongestants - helps relieve congestion   Flonase (generic fluticasone) or Nasacort (generic triamcinolone) - please make sure to use the "cross-over" technique at a 45 degree angle towards the opposite eye as opposed to straight up the nasal passageway.   Sudafed (generic pseudoephedrine - Note this is the one that is available behind the pharmacy counter); Products with phenylephrine (-PE) may also be used but is often not as effective as pseudoephedrine.   If you have HIGH BLOOD PRESSURE - Coricidin HBP; AVOID any product that is -D as this contains pseudoephedrine which may increase your blood pressure.  Afrin (oxymetazoline) every 6-8 hours for up to 3 days.   Allergies - helps relieve runny nose, itchy eyes and sneezing   Claritin (generic loratidine), Allegra (fexofenidine), or Zyrtec (generic cyrterizine) for runny nose. These medications should not cause drowsiness.  Note - Benadryl (generic diphenhydramine) may be used however may cause drowsiness  Cough -   Delsym or Robitussin (generic  dextromethorphan)  Expectorants - helps loosen mucus to ease removal   Mucinex (generic guaifenesin) as directed on the package.  Headaches / General Aches   Tylenol (generic acetaminophen) - DO NOT EXCEED 3 grams (3,000 mg) in a 24 hour time period  Advil/Motrin (generic ibuprofen)   Sore Throat -   Salt water gargle   Chloraseptic (generic benzocaine) spray or lozenges / Sucrets (generic dyclonine)

## 2016-12-11 NOTE — Progress Notes (Signed)
Subjective:    Patient ID: Penny Thomas, female    DOB: [birth year], 31 y.o.   MRN: [id number]  Chief Complaint  Patient presents with  . Nasal Congestion    ear popping, runny nose, congestion, cough and sore throat, x4 days    HPI:  Penny Thomas is a 31 y.o. 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 and presents today for an office visit.   This is a new problem. Associated symptoms of ear popping, runny nose, congestion, cough, sore throat, and sinus pressure have been going on for about 4 days. No fevers. Modifying factors include Zyrtec, Nasal spray, and inhalers which have helped a little. Course of the symptoms feels like it is worsening. Severity of the cough is enough to disturb her sleep. Timing of the symptoms is worse in the morning and at night.   No Known Allergies    Outpatient Medications Prior to Visit  Medication Sig Dispense Refill  . albuterol (PROVENTIL HFA;VENTOLIN HFA) 108 (90 BASE) MCG/ACT inhaler Inhale 1-2 puffs into the lungs every 6 (six) hours as needed for wheezing or shortness of breath. 1 Inhaler 0  . Biotin 1000 MCG tablet Take 2,000 mcg by mouth 3 (three) times daily.    . naproxen sodium (ANAPROX) 550 MG tablet Take 1 tablet (550 mg total) by mouth every 12 (twelve) hours as needed. 10 tablet 8  . propranolol ER (INDERAL LA) 60 MG 24 hr capsule Take 1 capsule (60 mg total) by mouth daily. 90 capsule 1  . predniSONE (STERAPRED UNI-PAK 21 TAB) 10 MG (21) TBPK tablet As directed 21 tablet 0   No facility-administered medications prior to visit.      Review of Systems  Constitutional: Negative for chills and  fever.  HENT: Positive for congestion, rhinorrhea, sinus pressure and sore throat. Negative for ear pain and sneezing.   Respiratory: Positive for cough. Negative for chest tightness, shortness of breath and wheezing.   Neurological: Positive for headaches.      Objective:    BP 112/70 (BP Location: Left Arm, Patient Position: Sitting, Cuff Size: Normal)   Pulse 65   Temp 98.6 F (37 C) (Oral)   Resp 16   Ht 5\' 7"  (1.702 m)   Wt 159 lb 1.9 oz (72.2 kg)   SpO2 96%   BMI 24.92 kg/m  Nursing note and vital signs reviewed.  Physical Exam  Constitutional: She is oriented to person, place, and time. She appears well-developed and well-nourished. No distress.  HENT:  Right Ear: Hearing, tympanic membrane, external ear and ear canal normal.  Left Ear: Hearing, tympanic membrane, external ear and ear canal normal.  Nose: Nose normal. Right sinus exhibits no maxillary sinus tenderness and no frontal sinus tenderness. Left sinus exhibits no maxillary sinus tenderness and no frontal sinus tenderness.  Mouth/Throat: Oropharynx is clear and moist and mucous membranes are normal.  Cardiovascular: Normal rate, regular rhythm and intact distal pulses.  Exam reveals no gallop and no friction rub.   No murmur heard. Pulmonary/Chest: Effort normal and breath sounds normal. No respiratory distress. She has no wheezes. She has no rales. She exhibits no tenderness.  Neurological: She is alert and oriented to person, place, and time.  Skin: Skin is warm and dry.  Psychiatric: She has a normal mood and affect. Her behavior is normal. Judgment and thought content normal.       Assessment & Plan:   Problem List Items Addressed This Visit      Respiratory   Acute nasopharyngitis - Primary    New onset symptoms consistent with upper respiratory infection or acute nasopharyngitis. Continue previously prescribed prednisone and over-the-counter medications as needed for symptom relief and supportive care.  Start Hycodan as needed for cough and sleep. Follow-up if symptoms worsen or do not improve.          I have discontinued Ms. Merrie Roof predniSONE. I am also having her start on HYDROcodone-homatropine. Additionally, I am having her maintain her albuterol, Biotin, naproxen sodium, and propranolol ER.   Meds ordered this encounter  Medications  . HYDROcodone-homatropine (HYCODAN) 5-1.5 MG/5ML syrup    Sig: Take 5 mLs by mouth every 8 (eight) hours as needed for cough.    Dispense:  120 mL    Refill:  0    Order Specific Question:   Supervising Provider    Answer:   Pricilla Holm A [[id number]]     Follow-up: Return if symptoms worsen or fail to improve.  Mauricio Po, FNP

## 2016-12-11 NOTE — Assessment & Plan Note (Signed)
New onset symptoms consistent with upper respiratory infection or acute nasopharyngitis. Continue previously prescribed prednisone and over-the-counter medications as needed for symptom relief and supportive care. Start Hycodan as needed for cough and sleep. Follow-up if symptoms worsen or do not improve.

## 2017-01-17 ENCOUNTER — Encounter: Payer: Self-pay | Admitting: Allergy

## 2017-01-17 ENCOUNTER — Ambulatory Visit (INDEPENDENT_AMBULATORY_CARE_PROVIDER_SITE_OTHER): Payer: PRIVATE HEALTH INSURANCE | Admitting: Allergy

## 2017-01-17 VITALS — BP 112/68 | HR 77 | Temp 98.1°F | Resp 17 | Ht 67.0 in | Wt 162.8 lb

## 2017-01-17 DIAGNOSIS — J309 Allergic rhinitis, unspecified: Secondary | ICD-10-CM

## 2017-01-17 DIAGNOSIS — J4540 Moderate persistent asthma, uncomplicated: Secondary | ICD-10-CM

## 2017-01-17 DIAGNOSIS — H1010 Acute atopic conjunctivitis, unspecified eye: Secondary | ICD-10-CM | POA: Diagnosis not present

## 2017-01-17 MED ORDER — OLOPATADINE HCL 0.2 % OP SOLN: 1.0000 [drp] | OPHTHALMIC | 5 refills | Status: DC

## 2017-01-17 MED ORDER — ALBUTEROL SULFATE HFA 108 (90 BASE) MCG/ACT IN AERS: 1.0000 | INHALATION_SPRAY | Freq: Four times a day (QID) | RESPIRATORY_TRACT | 2 refills | Status: DC | PRN

## 2017-01-17 MED ORDER — AZELASTINE HCL 0.1 % NA SOLN: 2.0000 | Freq: Two times a day (BID) | NASAL | 5 refills | Status: DC

## 2017-01-17 MED ORDER — BUDESONIDE-FORMOTEROL FUMARATE 160-4.5 MCG/ACT IN AERO: 2.0000 | INHALATION_SPRAY | Freq: Two times a day (BID) | RESPIRATORY_TRACT | 5 refills | Status: DC

## 2017-01-17 MED FILL — OLOPATADINE HCL 0.2 % SOLN: 0.2 | 25 days supply | Qty: 3 | Fill #0

## 2017-01-17 MED FILL — AZELASTINE 0.1% (137 MCG) S: 0.1 | 30 days supply | Qty: 30 | Fill #0

## 2017-01-17 MED FILL — SYMBICORT 160-4.5 MCG INH: 160-4.5 | 30 days supply | Qty: 10 | Fill #0

## 2017-01-17 NOTE — Patient Instructions (Addendum)
Allergic rhinoconjunctivitis    - Continue allergen avoidance measures    - resume Zyrtec 10mg  daily    - use Dymista nasal spray (this is a combination nasal spray with Flonase and Astelin combined) -- use 1 spray each nostril twice a day.      - will order Astelin to use once Dymista runs out.  Use 2 sprays each nostril twice a day for nasal drainage    - use Pataday 1 drop each eye daily as needed for itchy/watery/red eyes    - discussed allergen immunotherapy (allergy shots) as potential option of treating your allergies.   Will resume allergy regimen as above and if you still have symptoms consider restarting.  If you would like to restart will need to repeat allergy testing.    Asthma     - start symbicort 160 2 puffs twice a day     -  have access to albuterol inhaler 2 puffs every 4-6 hours as needed for cough/wheeze/shortness of breath/chest tightness.  May use 15-20 minutes prior to activity.   Monitor frequency of use.    Asthma control goals:   Full participation in all desired activities (may need albuterol before activity)  Albuterol use two time or less a week on average (not counting use with activity)  Cough interfering with sleep two time or less a month  Oral steroids no more than once a year  No hospitalizations  Follow-up 3 months or sooner if needed

## 2017-01-17 NOTE — Progress Notes (Signed)
New Patient Note  RE: Penny Thomas MRN: [id number] DOB: [birth year] Date of Office Visit: 01/17/2017  Referring provider: Golden Circle, FNP Primary care provider: Golden Circle, FNP  Chief Complaint: allergies  History of present illness: Penny Thomas is a 31 y.o. female presenting today for consultation for allergies.  She is a former patient of ours but has not been seen since 2013. She returns today as she's been having issues with controlling her allergies. She has a history of allergic rhinoconjunctivitis and asthma and states she has been out of all her medications for about the past year. She states she had change of insurance and was unable to get her medications until her insurance kicked in and she states she was making sure her kids were taking care of medically and now she is focusing on herself.  She reports symptoms of itchy and watery eyes and she feels like she wants to "scratch her eyes out". She also has nasal drainage and sneezing.  She reports the symptoms are worse in the summer and winter.  She was taking zyrtec and flonase but has been out for past year.   She reports the zyrtec would help control her eye and she has never used in eyedrop.   She states when she was tested years ago that she was positive to "all the pollen."  She did undergo AIT when she was around middle school/high school age and in is unsure how many years she was on AIT.  She does report having to take less medication when she was on AIT. She states she is not interested at this time and restarting.  She does have history of asthma diagnosed in childhood.  She used to take Advair and has been out for over a year.    She has been using albuterol 3-4 times a day.   Using albuterol as frequently for past year since being out of advair.  She has SOB, chest tightness and some wheezing.  Albuterol does help.  She states she probably should have gone to an urgent care or ED for  these symptoms but she has not. She has seen her PCP however who did give her a prednisone course within the past year.    No history of eczema.  She does report allergy to orange as she starts itching even with the smell of orange.  She states if she has to pill and orange for her kids that her hands will become itchy.    Review of systems: Review of Systems  Constitutional: Negative for chills, fever and malaise/fatigue.  HENT: Positive for congestion. Negative for ear discharge, ear pain, nosebleeds, sinus pain and sore throat.   Eyes: Positive for redness. Negative for blurred vision, pain and discharge.  Respiratory: Positive for cough, shortness of breath and wheezing. Negative for hemoptysis and sputum production.   Cardiovascular: Negative for chest pain.  Gastrointestinal: Negative for abdominal pain, constipation, diarrhea, heartburn, nausea and vomiting.  Musculoskeletal: Negative for joint pain.  Skin: Negative for itching and rash.  Neurological: Negative for headaches.    All other systems negative unless noted above in HPI  Past medical history: Past Medical History:  Diagnosis Date  . Allergy   . Anemia    takes iron supplement  . Asthma   . Asthma    prn inhaler  . Blood transfusion 2007  . Blood transfusion 2010  . Blood transfusion without reported diagnosis   . Chondromalacia of right  knee 01/2014  . Hemorrhage in uterus 01-20-09  . Migraines   . 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 Seasonal allergies   . Sickle cell anemia ([facility])    trait  . Sickle cell trait ([place])     Past surgical history: Past Surgical History:  Procedure Laterality Date  . CESAREAN SECTION  09-28-05  . CESAREAN SECTION  01-20-09  . CESAREAN SECTION    . ENDOMETRIAL ABLATION  10/2011   HEROPTIOIN  . KNEE ARTHROSCOPY Right 02/16/2014   Procedure: RIGHT ARTHROSCOPY KNEE;  Surgeon: Kerin Salen, MD;  Location: [facility];  Service: Orthopedics;  Laterality: Right;  .  POLYP REMOVAL  5-08   ?CERVICAL OR UTERINE POLYP  . TUBAL LIGATION  01-20-09   DONE WITH C-SECTION  . WISDOM TOOTH EXTRACTION Right 2014    Family history:  Family History  Problem Relation Age of Onset  . Fibromyalgia Mother   . Multiple sclerosis Mother   . Asthma Father   . Healthy Maternal Grandmother   . Healthy Maternal Grandfather   . Healthy Paternal Grandmother   . Healthy Paternal Grandfather   . Endometriosis Sister     Social history: Social History   Social History  . Marital status: Married    Spouse name: N/A  . Number of children: 4  . Years of education: 14   Occupational History  . [employer]    Social History Main Topics  . Smoking status: Never Smoker  . Smokeless tobacco: Never Used  . Alcohol use No     Comment: seldom  . Drug use: No  . Sexual activity: Yes    Birth control/ protection: Surgical     Comment: TUBAL LIGATION, INTERCOURSE AGE 69, SEXUAL PARTNERS 5   Other Topics Concern  . Not on file   Social History Narrative   Fun: Baking, playing with the children.   Denies abuse and feels safe at home.         Medication List: Allergies as of 01/17/2017   No Known Allergies     Medication List       Accurate as of 01/17/17  4:29 PM. Always use your most recent med list.          albuterol 108 (90 Base) MCG/ACT inhaler Commonly known as:  PROVENTIL HFA;VENTOLIN HFA Inhale 1-2 puffs into the lungs every 6 (six) hours as needed for wheezing or shortness of breath.   azelastine 0.1 % nasal spray Commonly known as:  ASTELIN Place 2 sprays into both nostrils 2 (two) times daily.   Biotin 1000 MCG tablet Take 2,000 mcg by mouth 3 (three) times daily.   budesonide-formoterol 160-4.5 MCG/ACT inhaler Commonly known as:  SYMBICORT Inhale 2 puffs into the lungs 2 (two) times daily.   naproxen sodium 550 MG tablet Commonly known as:  ANAPROX Take 1 tablet (550 mg total) by mouth every 12 (twelve) hours as needed.     Olopatadine HCl 0.2 % Soln Commonly known as:  PATADAY Place 1 drop into both eyes 1 day or 1 dose.   propranolol ER 60 MG 24 hr capsule Commonly known as:  INDERAL LA Take 1 capsule (60 mg total) by mouth daily.            Discharge Care Instructions        Start     Ordered   01/17/17 0000  Spirometry with Graph    Question Answer Comment  Where should this test be  performed? Other   Spirometry pre & post bronchodilator Yes      01/17/17 1621   01/17/17 0000  albuterol (PROVENTIL HFA;VENTOLIN HFA) 108 (90 Base) MCG/ACT inhaler  Every 6 hours PRN     01/17/17 1621   01/17/17 0000  budesonide-formoterol (SYMBICORT) 160-4.5 MCG/ACT inhaler  2 times daily     01/17/17 1621   01/17/17 0000  azelastine (ASTELIN) 0.1 % nasal spray  2 times daily     01/17/17 1621   01/17/17 0000  Olopatadine HCl (PATADAY) 0.2 % SOLN  1 Day/Dose     01/17/17 1621      Known medication allergies: No Known Allergies   Physical examination: Blood pressure 112/68, pulse 77, temperature 98.1 F (36.7 C), resp. rate 17, height 5\' 7"  (1.702 m), weight 162 lb 12.8 oz (73.8 kg), SpO2 98 %.  General: Alert, interactive, in no acute distress. HEENT: PERRLA, TMs pearly gray, turbinates mildly edematous with clear discharge, post-pharynx non erythematous. Neck: Supple without lymphadenopathy. Lungs: Clear to auscultation without wheezing, rhonchi or rales. {no increased work of breathing. CV: Normal S1, S2 without murmurs. Abdomen: Nondistended, nontender. Skin: Warm and dry, without lesions or rashes. Extremities:  No clubbing, cyanosis or edema. Neuro:   Grossly intact.  Diagnositics/Labs:  Spirometry: FEV1: 2.62L  76%, FVC: 2.85L  69%, ratio consistent with restrictive pattern  postbronchodilator she had no improvement   Assessment and plan:   Allergic rhinoconjunctivitis    - Continue allergen avoidance measures    - resume Zyrtec 10mg  daily    - use Dymista nasal spray (this is a  combination nasal spray with Flonase and Astelin combined) -- use 1 spray each nostril twice a day.      - will order Astelin to use once Dymista runs out.  Use 2 sprays each nostril twice a day for nasal drainage    - use Pataday 1 drop each eye daily as needed for itchy/watery/red eyes    - discussed allergen immunotherapy (allergy shots) as potential option of treating your allergies.   Will resume allergy regimen as above and if you still have symptoms consider restarting.  If you would like to restart will need to repeat allergy testing.    Asthma, moderate persistent      - start symbicort 160 2 puffs twice a day     -  have access to albuterol inhaler 2 puffs every 4-6 hours as needed for cough/wheeze/shortness of breath/chest tightness.  May use 15-20 minutes prior to activity.   Monitor frequency of use.    Asthma control goals:   Full participation in all desired activities (may need albuterol before activity)  Albuterol use two time or less a week on average (not counting use with activity)  Cough interfering with sleep two time or less a month  Oral steroids no more than once a year  No hospitalizations  Follow-up 3 months or sooner if needed  I appreciate the opportunity to take part in [place] care. Please do not hesitate to contact me with questions.  Sincerely,   Prudy Feeler, MD Allergy/Immunology [facility] [facility]

## 2017-01-21 ENCOUNTER — Encounter: Payer: Self-pay | Admitting: Women's Health

## 2017-01-22 ENCOUNTER — Telehealth: Payer: Self-pay | Admitting: Neurology

## 2017-01-22 ENCOUNTER — Other Ambulatory Visit: Payer: Self-pay | Admitting: Women's Health

## 2017-01-22 MED ORDER — OXYCODONE-ACETAMINOPHEN 5-325 MG PO TABS: 1.0000 | ORAL_TABLET | Freq: Four times a day (QID) | ORAL | 0 refills | Status: DC | PRN

## 2017-01-22 NOTE — Telephone Encounter (Signed)
PT called and wants to know if she can come in and get a headache cocktail or a prescription called in, please call and advise

## 2017-01-22 NOTE — Telephone Encounter (Signed)
Spoke w/Pt, she states has had headache off and on for a week, but constant x3 days. Is taking propranolol daily and naproxen as directed. Pt has driver for tomorrow, will come at 10:15am

## 2017-01-23 ENCOUNTER — Ambulatory Visit (INDEPENDENT_AMBULATORY_CARE_PROVIDER_SITE_OTHER): Payer: PRIVATE HEALTH INSURANCE

## 2017-01-23 DIAGNOSIS — G43009 Migraine without aura, not intractable, without status migrainosus: Secondary | ICD-10-CM

## 2017-01-23 MED ORDER — DIPHENHYDRAMINE HCL 50 MG/ML IJ SOLN
50.0000 mg | Freq: Once | INTRAMUSCULAR | Status: AC
  Administered 2017-01-23: 50 mg via INTRAMUSCULAR

## 2017-01-23 MED ORDER — KETOROLAC TROMETHAMINE 60 MG/2ML IM SOLN
60.0000 mg | Freq: Once | INTRAMUSCULAR | Status: AC
  Administered 2017-01-23: 60 mg via INTRAMUSCULAR

## 2017-01-23 MED ORDER — METOCLOPRAMIDE HCL 5 MG/ML IJ SOLN
10.0000 mg | Freq: Once | INTRAVENOUS | Status: AC
  Administered 2017-01-23: 10 mg via INTRAMUSCULAR

## 2017-02-06 MED FILL — OXYCOD/ACETAMINOPHEN 5-325M: 5-325 | 7 days supply | Qty: 30 | Fill #0

## 2017-02-27 ENCOUNTER — Encounter: Payer: Self-pay | Admitting: Women's Health

## 2017-03-13 MED FILL — OLOPATADINE HCL 0.2 % SOLN: 0.2 | 25 days supply | Qty: 3 | Fill #1

## 2017-03-19 ENCOUNTER — Ambulatory Visit (INDEPENDENT_AMBULATORY_CARE_PROVIDER_SITE_OTHER): Payer: PRIVATE HEALTH INSURANCE

## 2017-03-19 DIAGNOSIS — Z111 Encounter for screening for respiratory tuberculosis: Secondary | ICD-10-CM

## 2017-03-19 DIAGNOSIS — Z23 Encounter for immunization: Secondary | ICD-10-CM | POA: Diagnosis not present

## 2017-03-21 ENCOUNTER — Encounter: Payer: Self-pay | Admitting: *Deleted

## 2017-03-21 LAB — TB SKIN TEST
Induration: 0 mm
TB SKIN TEST: NEGATIVE

## 2017-04-10 ENCOUNTER — Ambulatory Visit: Payer: PRIVATE HEALTH INSURANCE | Admitting: Allergy

## 2017-04-10 ENCOUNTER — Encounter: Payer: Self-pay | Admitting: Allergy

## 2017-04-10 VITALS — BP 110/65 | HR 73 | Temp 98.5°F | Resp 16

## 2017-04-10 DIAGNOSIS — J4540 Moderate persistent asthma, uncomplicated: Secondary | ICD-10-CM | POA: Diagnosis not present

## 2017-04-10 DIAGNOSIS — J309 Allergic rhinitis, unspecified: Secondary | ICD-10-CM | POA: Diagnosis not present

## 2017-04-10 DIAGNOSIS — H1010 Acute atopic conjunctivitis, unspecified eye: Secondary | ICD-10-CM | POA: Diagnosis not present

## 2017-04-10 MED ORDER — CETIRIZINE HCL 10 MG PO TABS: 10.0000 mg | ORAL_TABLET | Freq: Every day | ORAL | 5 refills | Status: DC

## 2017-04-10 MED ORDER — OLOPATADINE HCL 0.1 % OP SOLN: 1.0000 [drp] | Freq: Two times a day (BID) | OPHTHALMIC | 5 refills | Status: DC | PRN

## 2017-04-10 MED FILL — OLOPATADINE HCL 0.1% EYE DR: 0.1 | 25 days supply | Qty: 5 | Fill #0

## 2017-04-10 NOTE — Progress Notes (Signed)
Follow-up Note  RE: Penny Thomas MRN: [id number] DOB: 1985-11-27 Date of Office Visit: 04/10/2017   History of present illness: Penny Thomas is a 31 y.o. female presenting today for follow-up of allergic rhinoconjunctivitis and asthma.  She was last seen in the office on 01/17/17 by myself.  Since this visit she has not had any major health changes, surgeries or hospitalizaitons.  She does state that her allergy symptoms are better.  She did use the Dymista sample provided at last visit and states he helped to decrease her nasal congestion/drainage.  She states she was able to discontinue use as her nasal symptoms resolved.  She does have flonase and astelin to use as needed as dymista was not covered by insurance.  She also takes zyrtec daily.  She has Pataday and asks if she can use it twice a day as she normally uses at night but does have more itchy eyes during the morning/afternoon.     With her asthma she feels symptoms have improved as well but she still needs to use albuterol about 2-3 times a day.  She states she is only using Symbicort 2 puffs once a day.  She states she has the most issues with the long walk from parking lot to her work now since is it colder.  She denies any nighttime awakenings, no ED/UC visits or oral steroid needs.     Review of systems: Review of Systems  Constitutional: Negative for chills, fever and malaise/fatigue.  HENT: Negative for congestion, ear discharge, ear pain, nosebleeds, sinus pain and sore throat.   Eyes: Negative for pain, discharge and redness.  Respiratory: Positive for cough and shortness of breath. Negative for hemoptysis, sputum production and wheezing.   Cardiovascular: Negative for chest pain.  Gastrointestinal: Negative for abdominal pain, constipation, diarrhea, heartburn, nausea and vomiting.  Musculoskeletal: Negative for joint pain.  Skin: Negative for itching and rash.  Neurological: Negative for headaches.     All other systems negative unless noted above in HPI  Past medical/social/surgical/family history have been reviewed and are unchanged unless specifically indicated below.  No changes  Medication List: Allergies as of 04/10/2017   No Known Allergies     Medication List        Accurate as of 04/10/17 10:41 AM. Always use your most recent med list.          albuterol 108 (90 Base) MCG/ACT inhaler Commonly known as:  PROVENTIL HFA;VENTOLIN HFA Inhale 1-2 puffs into the lungs every 6 (six) hours as needed for wheezing or shortness of breath.   azelastine 0.1 % nasal spray Commonly known as:  ASTELIN Place 2 sprays into both nostrils 2 (two) times daily.   Biotin 1000 MCG tablet Take 2,000 mcg by mouth 3 (three) times daily.   budesonide-formoterol 160-4.5 MCG/ACT inhaler Commonly known as:  SYMBICORT Inhale 2 puffs into the lungs 2 (two) times daily.   cetirizine 10 MG tablet Commonly known as:  ZYRTEC Take 1 tablet (10 mg total) daily by mouth.   naproxen sodium 550 MG tablet Commonly known as:  ANAPROX Take 1 tablet (550 mg total) by mouth every 12 (twelve) hours as needed.   Olopatadine HCl 0.2 % Soln Commonly known as:  PATADAY Place 1 drop into both eyes 1 day or 1 dose.   olopatadine 0.1 % ophthalmic solution Commonly known as:  PATANOL Place 1 drop 2 (two) times daily as needed into both eyes for allergies.   oxyCODONE-acetaminophen 5-325  MG tablet Commonly known as:  ROXICET Take 1 tablet by mouth every 6 (six) hours as needed for severe pain.   propranolol ER 60 MG 24 hr capsule Commonly known as:  INDERAL LA Take 1 capsule (60 mg total) by mouth daily.   rizatriptan 10 MG disintegrating tablet Commonly known as:  MAXALT-MLT   topiramate 50 MG tablet Commonly known as:  TOPAMAX       Known medication allergies: No Known Allergies   Physical examination: Blood pressure 110/65, pulse 73, temperature 98.5 F (36.9 C), temperature source  Oral, resp. rate 16, SpO2 97 %.  General: Alert, interactive, in no acute distress. HEENT: PERRLA, TMs pearly gray, turbinates minimally edematous without discharge, post-pharynx non erythematous. Neck: Supple without lymphadenopathy. Lungs: Clear to auscultation without wheezing, rhonchi or rales. {no increased work of breathing. CV: Normal S1, S2 without murmurs. Abdomen: Nondistended, nontender. Skin: Warm and dry, without lesions or rashes. Extremities:  No clubbing, cyanosis or edema. Neuro:   Grossly intact.  Diagnositics/Labs:  Spirometry: FEV1: 2.54L  85%, FVC: 2.86L  81%, ratio consistent with nonobstructive pattern  Assessment and plan:   Allergic rhinoconjunctivitis    - Continue allergen avoidance measures    - continue Zyrtec 10mg  daily    - use Flonase 1-2 sprays each nostril as needed for nasal congestion.  Use for 1-2 weeks at a time before stopping once symptoms improve    -  Use Astelin 2 sprays each nostril twice a day for nasal drainage    - will change to Patanol which can be use 1 drop twice a day as needed for itchy/watery/red eyes  Asthma, mod persistent     - continue symbicort 160 2 puffs and increase to twice a day     -  have access to albuterol inhaler 2 puffs every 4-6 hours as needed for cough/wheeze/shortness of breath/chest tightness.  May use 15-20 minutes prior to activity.   Monitor frequency of use.    Asthma control goals:   Full participation in all desired activities (may need albuterol before activity)  Albuterol use two time or less a week on average (not counting use with activity)  Cough interfering with sleep two time or less a month  Oral steroids no more than once a year  No hospitalizations  Follow-up 4 months or sooner if needed  I appreciate the opportunity to take part in [place] care. Please do not hesitate to contact me with questions.  Sincerely,   Prudy Feeler, MD Allergy/Immunology [facility]

## 2017-04-10 NOTE — Patient Instructions (Addendum)
Allergic rhinoconjunctivitis    - Continue allergen avoidance measures    - continue Zyrtec 10mg  daily    - use Flonase 1-2 sprays each nostril as needed for nasal congestion.  Use for 1-2 weeks at a time before stopping once symptoms improve    -  Use Astelin 2 sprays each nostril twice a day for nasal drainage    - will change to Patanol which can be use 1 drop twice a day as needed for itchy/watery/red eyes  Asthma     - continue symbicort 160 2 puffs and increase to twice a day     -  have access to albuterol inhaler 2 puffs every 4-6 hours as needed for cough/wheeze/shortness of breath/chest tightness.  May use 15-20 minutes prior to activity.   Monitor frequency of use.    Asthma control goals:   Full participation in all desired activities (may need albuterol before activity)  Albuterol use two time or less a week on average (not counting use with activity)  Cough interfering with sleep two time or less a month  Oral steroids no more than once a year  No hospitalizations  Follow-up 4 months or sooner if needed

## 2017-05-22 ENCOUNTER — Encounter: Payer: Self-pay | Admitting: Neurology

## 2017-05-26 ENCOUNTER — Other Ambulatory Visit: Payer: Self-pay

## 2017-05-26 MED ORDER — PROPRANOLOL HCL ER 60 MG PO CP24: 60.0000 mg | ORAL_CAPSULE | Freq: Every day | ORAL | 3 refills | Status: DC

## 2017-05-26 MED ORDER — NAPROXEN 500 MG PO TABS: 500.0000 mg | ORAL_TABLET | Freq: Two times a day (BID) | ORAL | 3 refills | Status: DC | PRN

## 2017-05-28 MED ORDER — NAPROXEN 500 MG PO TABS: 500.0000 mg | ORAL_TABLET | Freq: Two times a day (BID) | ORAL | 2 refills | Status: DC | PRN

## 2017-05-28 MED ORDER — PROPRANOLOL HCL ER 60 MG PO CP24: 60.0000 mg | ORAL_CAPSULE | Freq: Every day | ORAL | 2 refills | Status: DC

## 2017-06-16 ENCOUNTER — Encounter: Payer: Self-pay | Admitting: Neurology

## 2017-06-16 ENCOUNTER — Ambulatory Visit: Payer: PRIVATE HEALTH INSURANCE | Admitting: Neurology

## 2017-06-16 VITALS — BP 104/70 | HR 66 | Ht 67.0 in | Wt 176.1 lb

## 2017-06-16 DIAGNOSIS — G43109 Migraine with aura, not intractable, without status migrainosus: Secondary | ICD-10-CM

## 2017-06-16 MED ORDER — DICLOFENAC POTASSIUM(MIGRAINE) 50 MG PO PACK: 50.0000 mg | PACK | ORAL | 3 refills | Status: DC

## 2017-06-16 MED ORDER — DICLOFENAC POTASSIUM(MIGRAINE) 50 MG PO PACK: 50.0000 mg | PACK | ORAL | 0 refills | Status: DC

## 2017-06-16 NOTE — Patient Instructions (Signed)
Migraine Recommendations: 1.  Continue propranolol ER 60mg  daily 2.  At earliest onset of migraine, take Cambia (mix in water as directed).  Take once in 24 hours. 3.  Limit use of pain relievers to no more than 2 days out of the week.  These medications include acetaminophen, ibuprofen, triptans and narcotics.  This will help reduce risk of rebound headaches. 4.  Be aware of common food triggers such as processed sweets, processed foods with nitrites (such as deli meat, hot dogs, sausages), foods with MSG, alcohol (such as wine), chocolate, certain cheeses, certain fruits (dried fruits, bananas, some citrus fruit), vinegar, diet soda. 4.  Avoid caffeine 5.  Routine exercise 6.  Proper sleep hygiene 7.  Stay adequately hydrated with water 8.  Keep a headache diary. 9.  Maintain proper stress management. 10.  Do not skip meals. 11.  Consider supplements:  Magnesium citrate 400mg  to 600mg  daily, riboflavin 400mg , Coenzyme Q 10 100mg  three times daily 12.  Follow up in 6 months.

## 2017-06-16 NOTE — Progress Notes (Signed)
NEUROLOGY FOLLOW UP OFFICE NOTE  Penny Thomas [id number]  HISTORY OF PRESENT ILLNESS: Penny Thomas is a 32 year old right-handed female who follows up for migraine   UPDATE: Headaches had been well-controlled (2-3 a month) until November.  Changes in weather appear to be the trigger. Intensity:  6/10 Duration:  Usually 2-3 hours. Frequency:  2 to 3 days per month Rescue protocol:  1st naproxen 550mg , 2nd Excedrin Migraine (after 30 minutes) Current NSAIDS:  naproxen 550mg  Current analgesics:  Excedrin Migraine Current triptans:  no Current anti-nausea:  none Current muscle relaxants:  none Antihypertensive medications:  propranolol ER 60mg  Antidepressant medications:  none Anticonvulsant medications:  none Vitamins/Herbal/Supplements:  Biotin Antihistamines/Decongestants:  none Other therapy:  none   HISTORY: Onset:  32 years old Location:  Varies (bi-frontal, back of head) Quality:  Squeezing/"feels like I am hit in the head with a hammer" Initial Intensity:  9-10/10 Aura:  blurred vision Prodrome:  no Associated symptoms:  Nausea, dizziness, photophobia, osmophobia Initial Duration:  1 to 3 days initiail Frequency:  3 to 4 days per week (16 headache days per week) Triggers/exacerbating factors:  Menstrual cycle, caffeine, scents (perfume, deoderant, hand sanitizer), change in weather Relieving factors:  Laying down Activity:  Lays down if severe   Past NSAIDS:  naproxen 500mg , Mobic, ibuprofen 800mg  Past analgesics:  Hydrocodone-acetaminophen, tramadol 50mg , Roxicet, Excedrin Migraine Past abortive triptans:  sumatriptan 100mg , Maxalt 10mg , Relpax 20mg  Past muscle relaxants:  Flexeril Past anti-nausea:  Zofran 4mg , promethazine 25mg  Past antihypertensive medications:  no Past antidepressant medications:  amitriptyline 50mg  (weight gain) Past anticonvulsant medications:  topiramate 150mg  (effective but caused hair loss) Past  vitamins/Herbal/Supplements:  no Past antihistamines/decongestants:  no Other past medications:  no   Caffeine:  no Alcohol:  seldom Smoker:  no Depression/stress:  no Sleep hygiene:  worse Exercise:  No Diet:  Hydrates with water Family history of headache:  Mom, sister, grandmother  PAST MEDICAL HISTORY: Past Medical History:  Diagnosis Date  . Allergy   . Anemia    takes iron supplement  . Asthma   . Asthma    prn inhaler  . Blood transfusion 2007  . Blood transfusion 2010  . Blood transfusion without reported diagnosis   . 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 Hemorrhage in uterus 01-20-09  . Migraines   . 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 Seasonal allergies   . Sickle cell anemia (HCC)    trait  . Sickle cell trait ([place])     MEDICATIONS: Current Outpatient Medications on File Prior to Visit  Medication Sig Dispense Refill  . albuterol (PROVENTIL HFA;VENTOLIN HFA) 108 (90 Base) MCG/ACT inhaler Inhale 1-2 puffs into the lungs every 6 (six) hours as needed for wheezing or shortness of breath. 1 Inhaler 2  . azelastine (ASTELIN) 0.1 % nasal spray Place 2 sprays into both nostrils 2 (two) times daily. 30 mL 5  . Biotin 1000 MCG tablet Take 2,000 mcg by mouth 3 (three) times daily.    . budesonide-formoterol (SYMBICORT) 160-4.5 MCG/ACT inhaler Inhale 2 puffs into the lungs 2 (two) times daily. 1 Inhaler 5  . cetirizine (ZYRTEC) 10 MG tablet Take 1 tablet (10 mg total) daily by mouth. 30 tablet 5  . naproxen (NAPROSYN) 500 MG tablet Take 1 tablet (500 mg total) by mouth every 12 (twelve) hours as needed. 180 tablet 3  . olopatadine (PATANOL) 0.1 % ophthalmic solution Place 1 drop 2 (two) times daily as needed into both  eyes for allergies. 5 mL 5  . Olopatadine HCl (PATADAY) 0.2 % SOLN Place 1 drop into both eyes 1 day or 1 dose. 1 Bottle 5  . oxyCODONE-acetaminophen (ROXICET) 5-325 MG tablet Take 1 tablet by mouth every 6 (six) hours as needed for severe pain. 30  tablet 0  . propranolol ER (INDERAL LA) 60 MG 24 hr capsule Take 1 capsule (60 mg total) by mouth daily. 90 capsule 1   No current facility-administered medications on file prior to visit.     ALLERGIES: No Known Allergies  FAMILY HISTORY: Family History  Problem Relation Age of Onset  . Fibromyalgia Mother   . Multiple sclerosis Mother   . Asthma Father   . Healthy Maternal Grandmother   . Healthy Maternal Grandfather   . Healthy Paternal Grandmother   . Healthy Paternal Grandfather   . Endometriosis Sister     SOCIAL HISTORY: Social History   Socioeconomic History  . Marital status: Married    Spouse name: Not on file  . Number of children: 4  . Years of education: 36  . Highest education level: Not on file  Social Needs  . Financial resource strain: Not on file  . Food insecurity - worry: Not on file  . Food insecurity - inability: Not on file  . Transportation needs - medical: Not on file  . Transportation needs - non-medical: Not on file  Occupational History  . Occupation: NT/Transporter  Tobacco Use  . Smoking status: Never Smoker  . Smokeless tobacco: Never Used  Substance and Sexual Activity  . Alcohol use: No    Alcohol/week: 0.0 oz    Comment: seldom  . Drug use: No  . Sexual activity: Yes    Birth control/protection: Surgical    Comment: TUBAL LIGATION, INTERCOURSE AGE 45, SEXUAL PARTNERS 5  Other Topics Concern  . Not on file  Social History Narrative   Fun: Baking, playing with the children.   Denies abuse and feels safe at home.         REVIEW OF SYSTEMS: Constitutional: No fevers, chills, or sweats, no generalized fatigue, change in appetite Eyes: No visual changes, double vision, eye pain Ear, nose and throat: No hearing loss, ear pain, nasal congestion, sore throat Cardiovascular: No chest pain, palpitations Respiratory:  No shortness of breath at rest or with exertion, wheezes GastrointestinaI: No nausea, vomiting, diarrhea, abdominal  pain, fecal incontinence Genitourinary:  No dysuria, urinary retention or frequency Musculoskeletal:  No neck pain, back pain Integumentary: No rash, pruritus, skin lesions Neurological: as above Psychiatric: No depression, insomnia, anxiety Endocrine: No palpitations, fatigue, diaphoresis, mood swings, change in appetite, change in weight, increased thirst Hematologic/Lymphatic:  No purpura, petechiae. Allergic/Immunologic: no itchy/runny eyes, nasal congestion, recent allergic reactions, rashes  PHYSICAL EXAM: Vitals:   06/16/17 0724  BP: 104/70  Pulse: 66  SpO2: 99%   General: No acute distress.  Patient appears well-groomed.   Head:  Normocephalic/atraumatic Eyes:  Fundi examined but not visualized Neck: supple, no paraspinal tenderness, full range of motion Heart:  Regular rate and rhythm Lungs:  Clear to auscultation bilaterally Back: No paraspinal tenderness Neurological Exam: alert and oriented to person, place, and time. Attention span and concentration intact, recent and remote memory intact, fund of knowledge intact.  Speech fluent and not dysarthric, language intact.  CN II-XII intact. Bulk and tone normal, muscle strength 5/5 throughout.  Sensation to light touch  intact.  Deep tendon reflexes 2+ throughout.  Finger to nose testing intact.  Gait normal, Romberg negative.  IMPRESSION: Migraine with aura.    PLAN: At this point, she would rather not change or add another a pharmacologic preventative.  1.  Recommend taking Mg, B2 and CoQ10 2.  Increase exercise, follow sleep hygiene instructions and continue hydration. 3.  For abortive therapy, will try Cambia to shorten migraine duration. 4.  Follow up in 6 months.  Metta Clines, DO  CC:  Mauricio Po, FNP

## 2017-06-24 ENCOUNTER — Telehealth: Payer: Self-pay | Admitting: Neurology

## 2017-06-24 NOTE — Telephone Encounter (Signed)
Pt called and said the headache medication she is taking is not working and wants to know if Dr Tomi Likens can prescribe something else

## 2017-06-24 NOTE — Telephone Encounter (Signed)
LM on VM for Pt to call back

## 2017-06-26 ENCOUNTER — Encounter: Payer: Self-pay | Admitting: Neurology

## 2017-06-26 ENCOUNTER — Encounter: Payer: Self-pay | Admitting: Women's Health

## 2017-06-26 NOTE — Telephone Encounter (Signed)
Dr Loura Pardon sent a my chart message about a nasal spray or injection that was discussed at the last office visit. Her note states she doesn't want to add anything. I advised her she could come in for a headache cocktail if she had a driver, or Toradol if not. Pls advise about N/S or injection.

## 2017-06-27 ENCOUNTER — Other Ambulatory Visit: Payer: Self-pay

## 2017-06-27 MED ORDER — SUMATRIPTAN SUCCINATE 6 MG/0.5ML ~~LOC~~ SOAJ: 6.0000 mg | SUBCUTANEOUS | 3 refills | Status: DC | PRN

## 2017-06-27 NOTE — Telephone Encounter (Signed)
We can offer her: Migranal: 1 actuation in each nostril every 15 minutes x 2 (maximum 4 actuations/attack, 6 actuations/day and 8 actuations/week).  OR  Sumatriptan 6mg  [place].  May repeat once after 1 hour if needed (not to exceed two injections in 24 hours).

## 2017-06-27 NOTE — Telephone Encounter (Signed)
see email message

## 2017-07-01 ENCOUNTER — Encounter: Payer: Self-pay | Admitting: Family

## 2017-07-02 ENCOUNTER — Ambulatory Visit (INDEPENDENT_AMBULATORY_CARE_PROVIDER_SITE_OTHER): Payer: PRIVATE HEALTH INSURANCE | Admitting: Women's Health

## 2017-07-02 ENCOUNTER — Encounter: Payer: Self-pay | Admitting: Women's Health

## 2017-07-02 VITALS — BP 126/80 | Ht 67.0 in | Wt 173.0 lb

## 2017-07-02 DIAGNOSIS — Z13220 Encounter for screening for lipoid disorders: Secondary | ICD-10-CM | POA: Diagnosis not present

## 2017-07-02 DIAGNOSIS — Z01419 Encounter for gynecological examination (general) (routine) without abnormal findings: Secondary | ICD-10-CM | POA: Diagnosis not present

## 2017-07-02 MED ORDER — OXYCODONE-ACETAMINOPHEN 5-325 MG PO TABS: 1.0000 | ORAL_TABLET | Freq: Four times a day (QID) | ORAL | 0 refills | Status: DC | PRN

## 2017-07-02 NOTE — Progress Notes (Signed)
Penny Thomas May 05, 1986 [id number]    History:    Presents for annual exam. 2013 endometrial ablation amenorrheic/BTL. Continues with dysmenorrhea monthly with no bleeding occasional Roxicet. Normal Pap history. Migraines neurologist managing. History of sickle cell trait and GDM.   Past medical history, past surgical history, family history and social history were all reviewed and documented in the EPIC chart. CNA at [employer]. 4 sons ages 35, twins 71, 37-year-old. Mother diabetes.  ROS:  A ROS was performed and pertinent positives and negatives are included.  Exam:  Vitals:   07/02/17 0801  BP: 126/80  Weight: 173 lb (78.5 kg)  Height: 5\' 7"  (1.702 m)   Body mass index is 27.1 kg/m.   General appearance:  Normal Thyroid:  Symmetrical, normal in size, without palpable masses or nodularity. Respiratory  Auscultation:  Clear without wheezing or rhonchi Cardiovascular  Auscultation:  Regular rate, without rubs, murmurs or gallops  Edema/varicosities:  Not grossly evident Abdominal  Soft,nontender, without masses, guarding or rebound.  Liver/spleen:  No organomegaly noted  Hernia:  None appreciated  Skin  Inspection:  Grossly normal   Breasts: Examined lying and sitting.     Right: Without masses, retractions, discharge or axillary adenopathy.     Left: Without masses, retractions, discharge or axillary adenopathy. Gentitourinary   Inguinal/mons:  Normal without inguinal adenopathy  External genitalia:  Normal  BUS/Urethra/Skene's glands:  Normal  Vagina:  Normal  Cervix:  Normal  Uterus:   normal in size, shape and contour.  Midline and mobile  Adnexa/parametria:     Rt: Without masses or tenderness.   Lt: Without masses or tenderness.  Anus and perineum: Normal  Digital rectal exam: Normal sphincter tone without palpated masses or tenderness  Assessment/Plan:  32 y.o. MBF G3 P4  for annual exam with no complaints.  2013 endometrial  ablation-amenorrhea/BTL Migraines-neurologist managing Dysmenorrhea occasional Roxicet.  Plan: Roxicet 5/325 every 8 hours when necessary #30 no refills prescription, proper use, addictive properties aware to use sparingly. SBE's, exercise, calcium rich diet, MVI daily encouraged. Reviewed importance of decreasing calories/carbs for weight loss has gained 20 pounds  past year. CBC, lipid panel, hemoglobin A1c, Pap normal 2018, new screening guidelines reviewed.    [place], 8:28 AM 07/02/2017

## 2017-07-02 NOTE — Patient Instructions (Signed)
Migraine Headache A migraine headache is a very strong throbbing pain on one side or both sides of your head. Migraines can also cause other symptoms. Talk with your doctor about what things may bring on (trigger) your migraine headaches. Follow these instructions at home: Medicines  Take over-the-counter and prescription medicines only as told by your doctor.  Do not drive or use heavy machinery while taking prescription pain medicine.  To prevent or treat constipation while you are taking prescription pain medicine, your doctor may recommend that you: ? Drink enough fluid to keep your pee (urine) clear or pale yellow. ? Take over-the-counter or prescription medicines. ? Eat foods that are high in fiber. These include fresh fruits and vegetables, whole grains, and beans. ? Limit foods that are high in fat and processed sugars. These include fried and sweet foods. Lifestyle  Avoid alcohol.  Do not use any products that contain nicotine or tobacco, such as cigarettes and e-cigarettes. If you need help quitting, ask your doctor.  Get at least 8 hours of sleep every night.  Limit your stress. General instructions   Keep a journal to find out what may bring on your migraines. For example, write down: ? What you eat and drink. ? How much sleep you get. ? Any change in what you eat or drink. ? Any change in your medicines.  If you have a migraine: ? Avoid things that make your symptoms worse, such as bright lights. ? It may help to lie down in a dark, quiet room. ? Do not drive or use heavy machinery. ? Ask your doctor what activities are safe for you.  Keep all follow-up visits as told by your doctor. This is important. Contact a doctor if:  You get a migraine that is different or worse than your usual migraines. Get help right away if:  Your migraine gets very bad.  You have a fever.  You have a stiff neck.  You have trouble seeing.  Your muscles feel weak or like you  cannot control them.  You start to lose your balance a lot.  You start to have trouble walking.  You pass out (faint). This information is not intended to replace advice given to you by your health care provider. Make sure you discuss any questions you have with your health care provider. Document Released: 02/20/2008 Document Revised: 12/01/2015 Document Reviewed: 10/30/2015 Elsevier Interactive Patient Education  2018 [place] Maintenance, Female Adopting a healthy lifestyle and getting preventive care can go a long way to promote health and wellness. Talk with your health care provider about what schedule of regular examinations is right for you. This is a good chance for you to check in with your provider about disease prevention and staying healthy. In between checkups, there are plenty of things you can do on your own. Experts have done a lot of research about which lifestyle changes and preventive measures are most likely to keep you healthy. Ask your health care provider for more information. Weight and diet Eat a healthy diet  Be sure to include plenty of vegetables, fruits, low-fat dairy products, and lean protein.  Do not eat a lot of foods high in solid fats, added sugars, or salt.  Get regular exercise. This is one of the most important things you can do for your health. ? Most adults should exercise for at least 150 minutes each week. The exercise should increase your heart rate and make you sweat (moderate-intensity exercise). ? Most  adults should also do strengthening exercises at least twice a week. This is in addition to the moderate-intensity exercise.  Maintain a healthy weight  Body mass index (BMI) is a measurement that can be used to identify possible weight problems. It estimates body fat based on height and weight. Your health care provider can help determine your BMI and help you achieve or maintain a healthy weight.  For females 41 years of age and  older: ? A BMI below 18.5 is considered underweight. ? A BMI of 18.5 to 24.9 is normal. ? A BMI of 25 to 29.9 is considered overweight. ? A BMI of 30 and above is considered obese.  Watch levels of cholesterol and blood lipids  You should start having your blood tested for lipids and cholesterol at 32 years of age, then have this test every 5 years.  You may need to have your cholesterol levels checked more often if: ? Your lipid or cholesterol levels are high. ? You are older than 32 years of age. ? You are at high risk for heart disease.  Cancer screening Lung Cancer  Lung cancer screening is recommended for adults 30-58 years old who are at high risk for lung cancer because of a history of smoking.  A yearly low-dose CT scan of the lungs is recommended for people who: ? Currently smoke. ? Have quit within the past 15 years. ? Have at least a 30-pack-year history of smoking. A pack year is smoking an average of one pack of cigarettes a day for 1 year.  Yearly screening should continue until it has been 15 years since you quit.  Yearly screening should stop if you develop a health problem that would prevent you from having lung cancer treatment.  Breast Cancer  Practice breast self-awareness. This means understanding how your breasts normally appear and feel.  It also means doing regular breast self-exams. Let your health care provider know about any changes, no matter how small.  If you are in your 20s or 30s, you should have a clinical breast exam (CBE) by a health care provider every 1-3 years as part of a regular health exam.  If you are 3 or older, have a CBE every year. Also consider having a breast X-ray (mammogram) every year.  If you have a family history of breast cancer, talk to your health care provider about genetic screening.  If you are at high risk for breast cancer, talk to your health care provider about having an MRI and a mammogram every year.  Breast  cancer gene (BRCA) assessment is recommended for women who have family members with BRCA-related cancers. BRCA-related cancers include: ? Breast. ? Ovarian. ? Tubal. ? Peritoneal cancers.  Results of the assessment will determine the need for genetic counseling and BRCA1 and BRCA2 testing.  Cervical Cancer Your health care provider may recommend that you be screened regularly for cancer of the pelvic organs (ovaries, uterus, and vagina). This screening involves a pelvic examination, including checking for microscopic changes to the surface of your cervix (Pap test). You may be encouraged to have this screening done every 3 years, beginning at age 64.  For women ages 71-65, health care providers may recommend pelvic exams and Pap testing every 3 years, or they may recommend the Pap and pelvic exam, combined with testing for human papilloma virus (HPV), every 5 years. Some types of HPV increase your risk of cervical cancer. Testing for HPV may also be done on women of  any age with unclear Pap test results.  Other health care providers may not recommend any screening for nonpregnant women who are considered low risk for pelvic cancer and who do not have symptoms. Ask your health care provider if a screening pelvic exam is right for you.  If you have had past treatment for cervical cancer or a condition that could lead to cancer, you need Pap tests and screening for cancer for at least 20 years after your treatment. If Pap tests have been discontinued, your risk factors (such as having a new sexual partner) need to be reassessed to determine if screening should resume. Some women have medical problems that increase the chance of getting cervical cancer. In these cases, your health care provider may recommend more frequent screening and Pap tests.  Colorectal Cancer  This type of cancer can be detected and often prevented.  Routine colorectal cancer screening usually begins at 32 years of age and  continues through 32 years of age.  Your health care provider may recommend screening at an earlier age if you have risk factors for colon cancer.  Your health care provider may also recommend using home test kits to check for hidden blood in the stool.  A small camera at the end of a tube can be used to examine your colon directly (sigmoidoscopy or colonoscopy). This is done to check for the earliest forms of colorectal cancer.  Routine screening usually begins at age 103.  Direct examination of the colon should be repeated every 5-10 years through 32 years of age. However, you may need to be screened more often if early forms of precancerous polyps or small growths are found.  Skin Cancer  Check your skin from head to toe regularly.  Tell your health care provider about any new moles or changes in moles, especially if there is a change in a mole's shape or color.  Also tell your health care provider if you have a mole that is larger than the size of a pencil eraser.  Always use sunscreen. Apply sunscreen liberally and repeatedly throughout the day.  Protect yourself by wearing long sleeves, pants, a wide-brimmed hat, and sunglasses whenever you are outside.  Heart disease, diabetes, and high blood pressure  High blood pressure causes heart disease and increases the risk of stroke. High blood pressure is more likely to develop in: ? People who have blood pressure in the high end of the normal range (130-139/85-89 mm Hg). ? People who are overweight or obese. ? People who are African American.  If you are 82-87 years of age, have your blood pressure checked every 3-5 years. If you are 30 years of age or older, have your blood pressure checked every year. You should have your blood pressure measured twice-once when you are at a hospital or clinic, and once when you are not at a hospital or clinic. Record the average of the two measurements. To check your blood pressure when you are not  at a hospital or clinic, you can use: ? An automated blood pressure machine at a pharmacy. ? A home blood pressure monitor.  If you are between 68 years and 43 years old, ask your health care provider if you should take aspirin to prevent strokes.  Have regular diabetes screenings. This involves taking a blood sample to check your fasting blood sugar level. ? If you are at a normal weight and have a low risk for diabetes, have this test once every three years  after 32 years of age. ? If you are overweight and have a high risk for diabetes, consider being tested at a younger age or more often. Preventing infection Hepatitis B  If you have a higher risk for hepatitis B, you should be screened for this virus. You are considered at high risk for hepatitis B if: ? You were born in a country where hepatitis B is common. Ask your health care provider which countries are considered high risk. ? Your parents were born in a high-risk country, and you have not been immunized against hepatitis B (hepatitis B vaccine). ? You have HIV or AIDS. ? You use needles to inject street drugs. ? You live with someone who has hepatitis B. ? You have had sex with someone who has hepatitis B. ? You get hemodialysis treatment. ? You take certain medicines for conditions, including cancer, organ transplantation, and autoimmune conditions.  Hepatitis C  Blood testing is recommended for: ? Everyone born from [birth year] through 1965. ? Anyone with known risk factors for hepatitis C.  Sexually transmitted infections (STIs)  You should be screened for sexually transmitted infections (STIs) including gonorrhea and chlamydia if: ? You are sexually active and are younger than 32 years of age. ? You are older than 32 years of age and your health care provider tells you that you are at risk for this type of infection. ? Your sexual activity has changed since you were last screened and you are at an increased risk for chlamydia  or gonorrhea. Ask your health care provider if you are at risk.  If you do not have HIV, but are at risk, it may be recommended that you take a prescription medicine daily to prevent HIV infection. This is called pre-exposure prophylaxis (PrEP). You are considered at risk if: ? You are sexually active and do not regularly use condoms or know the HIV status of your partner(s). ? You take drugs by injection. ? You are sexually active with a partner who has HIV.  Talk with your health care provider about whether you are at high risk of being infected with HIV. If you choose to begin PrEP, you should first be tested for HIV. You should then be tested every 3 months for as long as you are taking PrEP. Pregnancy  If you are premenopausal and you may become pregnant, ask your health care provider about preconception counseling.  If you may become pregnant, take 400 to 800 micrograms (mcg) of folic acid every day.  If you want to prevent pregnancy, talk to your health care provider about birth control (contraception). Osteoporosis and menopause  Osteoporosis is a disease in which the bones lose minerals and strength with aging. This can result in serious bone fractures. Your risk for osteoporosis can be identified using a bone density scan.  If you are 80 years of age or older, or if you are at risk for osteoporosis and fractures, ask your health care provider if you should be screened.  Ask your health care provider whether you should take a calcium or vitamin D supplement to lower your risk for osteoporosis.  Menopause may have certain physical symptoms and risks.  Hormone replacement therapy may reduce some of these symptoms and risks. Talk to your health care provider about whether hormone replacement therapy is right for you. Follow these instructions at home:  Schedule regular health, dental, and eye exams.  Stay current with your immunizations.  Do not use any tobacco products  including  cigarettes, chewing tobacco, or electronic cigarettes.  If you are pregnant, do not drink alcohol.  If you are breastfeeding, limit how much and how often you drink alcohol.  Limit alcohol intake to no more than 1 drink per day for nonpregnant women. One drink equals 12 ounces of beer, 5 ounces of wine, or 1 ounces of hard liquor.  Do not use street drugs.  Do not share needles.  Ask your health care provider for help if you need support or information about quitting drugs.  Tell your health care provider if you often feel depressed.  Tell your health care provider if you have ever been abused or do not feel safe at home. This information is not intended to replace advice given to you by your health care provider. Make sure you discuss any questions you have with your health care provider. Document Released: 11/26/2010 Document Revised: 10/19/2015 Document Reviewed: 02/14/2015 Elsevier Interactive Patient Education  [employer].

## 2017-07-03 LAB — URINALYSIS, COMPLETE W/RFL CULTURE
BILIRUBIN URINE: NEGATIVE
Bacteria, UA: NONE SEEN /HPF
GLUCOSE, UA: NEGATIVE
HGB URINE DIPSTICK: NEGATIVE
HYALINE CAST: NONE SEEN /LPF
KETONES UR: NEGATIVE
LEUKOCYTE ESTERASE: NEGATIVE
NITRITES URINE, INITIAL: NEGATIVE
PROTEIN: NEGATIVE
RBC / HPF: NONE SEEN /HPF (ref 0–2)
Specific Gravity, Urine: 1.015 (ref 1.001–1.03)
Squamous Epithelial / LPF: NONE SEEN /HPF (ref <=5)
WBC UA: NONE SEEN /HPF (ref 0–5)
pH: <=5 (ref 5.0–8.0)

## 2017-07-03 LAB — CBC WITH DIFFERENTIAL/PLATELET
BASOS ABS: 21 {cells}/uL (ref 0–200)
Basophils Relative: 0.5 %
EOS PCT: 1.2 %
Eosinophils Absolute: 49 cells/uL (ref 15–500)
HCT: 38.7 % (ref 35.0–45.0)
Hemoglobin: 12.8 g/dL (ref 11.7–15.5)
Lymphs Abs: 1579 cells/uL (ref 850–3900)
MCH: 28.3 pg (ref 27.0–33.0)
MCHC: 33.1 g/dL (ref 32.0–36.0)
MCV: 85.4 fL (ref 80.0–100.0)
MONOS PCT: 10.9 %
MPV: 11.1 fL (ref 7.5–12.5)
Neutro Abs: 2005 cells/uL (ref 1500–7800)
Neutrophils Relative %: 48.9 %
Platelets: 246 10*3/uL (ref 140–400)
RBC: 4.53 10*6/uL (ref 3.80–5.10)
RDW: 13.4 % (ref 11.0–15.0)
TOTAL LYMPHOCYTE: 38.5 %
WBC mixed population: 447 cells/uL (ref 200–950)
WBC: 4.1 10*3/uL (ref 3.8–10.8)

## 2017-07-03 LAB — URINE CULTURE
MICRO NUMBER:: 90160293
SPECIMEN QUALITY:: ADEQUATE

## 2017-07-03 LAB — LIPID PANEL
CHOLESTEROL: 177 mg/dL (ref <200)
HDL: 60 mg/dL (ref >50)
LDL Cholesterol (Calc): 94 mg/dL (calc)
Non-HDL Cholesterol (Calc): 117 mg/dL (calc) (ref <130)
Total CHOL/HDL Ratio: 3 (calc) (ref <5.0)
Triglycerides: 125 mg/dL (ref <150)

## 2017-07-03 LAB — HEMOGLOBIN A1C
Hgb A1c MFr Bld: 5.4 % of total Hgb (ref <5.7)
Mean Plasma Glucose: 108 (calc)
eAG (mmol/L): 6 (calc)

## 2017-07-03 LAB — NO CULTURE INDICATED

## 2017-07-14 ENCOUNTER — Other Ambulatory Visit: Payer: Self-pay

## 2017-07-14 ENCOUNTER — Ambulatory Visit (HOSPITAL_COMMUNITY)
Admission: EM | Admit: 2017-07-14 | Discharge: 2017-07-14 | Disposition: A | Discharge status: Home / Self Care | Payer: PRIVATE HEALTH INSURANCE | Attending: Family Medicine | Admitting: Family Medicine

## 2017-07-14 ENCOUNTER — Encounter (HOSPITAL_COMMUNITY): Payer: Self-pay | Admitting: Emergency Medicine

## 2017-07-14 DIAGNOSIS — R6889 Other general symptoms and signs: Secondary | ICD-10-CM | POA: Diagnosis not present

## 2017-07-14 MED ORDER — OSELTAMIVIR PHOSPHATE 75 MG PO CAPS: 75.0000 mg | ORAL_CAPSULE | Freq: Two times a day (BID) | ORAL | 0 refills | Status: DC

## 2017-07-14 MED ORDER — BENZONATATE 100 MG PO CAPS: 100.0000 mg | ORAL_CAPSULE | Freq: Three times a day (TID) | ORAL | 0 refills | Status: DC | PRN

## 2017-07-14 NOTE — ED Triage Notes (Signed)
The patient presented to the [facility] with a complaint of a runny nose, cough and congestion x 4 days.

## 2017-07-14 NOTE — Discharge Instructions (Signed)
Continue to push fluids, practice good hand hygiene, and cover your mouth if you cough.  If you start having fevers, shaking or shortness of breath, seek immediate care.  Ibuprofen 400-600 mg (2-3 over the counter strength tabs) every 6 hours as needed for pain.  OK to take Tylenol 1000 mg (2 extra strength tabs) or 975 mg (3 regular strength tabs) every 6 hours as needed.

## 2017-07-14 NOTE — ED Provider Notes (Signed)
Penny Thomas    CSN: [id number] Arrival date & time: 07/14/17  1901     History   Chief Complaint Chief Complaint  Patient presents with  . Cough    HPI Penny Thomas is a 32 y.o. female.   HPI  Duration: 2 days  Associated symptoms: subjective fever, sinus congestion, rhinorrhea, chest tightness, myalgia and cough Denies: sinus pain, itchy watery eyes, ear pain, ear drainage and sore throat Treatment to date: Tylenol Sick contacts: No    Past Medical History:  Diagnosis Date  . Allergy   . Anemia    takes iron supplement  . Asthma   . Asthma    prn inhaler  . Blood transfusion 2007  . Blood transfusion 2010  . Blood transfusion without reported diagnosis   . 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 Hemorrhage in uterus 01-20-09  . Migraines   . 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 Seasonal allergies   . Sickle cell anemia (HCC)    trait  . Sickle cell trait [facility])     Patient Active Problem List   Diagnosis Date Noted  . Acute nasopharyngitis 12/11/2016  . Intractable chronic migraine without aura and without status migrainosus 06/23/2015  . Migraines 03/23/2015  . Right-sided low back pain without sciatica 03/23/2015  . Muscle cramps 03/23/2015  . Dysmenorrhea 04/08/2013  . Endometriosis 04/08/2013    Past Surgical History:  Procedure Laterality Date  . CESAREAN SECTION  09-28-05  . CESAREAN SECTION  01-20-09  . CESAREAN SECTION    . ENDOMETRIAL ABLATION  10/2011   HEROPTIOIN  . KNEE ARTHROSCOPY Right 02/16/2014   Procedure: RIGHT ARTHROSCOPY KNEE;  Surgeon: Kerin Salen, MD;  Location: [place];  Service: Orthopedics;  Laterality: Right;  . POLYP REMOVAL  5-08   ?CERVICAL OR UTERINE POLYP  . TUBAL LIGATION  01-20-09   DONE WITH C-SECTION  . WISDOM TOOTH EXTRACTION Right 2014    OB History    Gravida Para Term Preterm AB Living   3 3       4    SAB TAB Ectopic Multiple Live Births         1 4        Home Medications    Prior to Admission medications   Medication Sig Start Date End Date Taking? Authorizing Provider  albuterol (PROVENTIL HFA;VENTOLIN HFA) 108 (90 Base) MCG/ACT inhaler Inhale 1-2 puffs into the lungs every 6 (six) hours as needed for wheezing or shortness of breath. 01/17/17   Padgett, Rae Halsted, MD  azelastine (ASTELIN) 0.1 % nasal spray Place 2 sprays into both nostrils 2 (two) times daily. 01/17/17   Kennith Gain, MD  benzonatate (TESSALON) 100 MG capsule Take 1 capsule (100 mg total) by mouth 3 (three) times daily as needed. 07/14/17   Shelda Pal, DO  Biotin 1000 MCG tablet Take 2,000 mcg by mouth 3 (three) times daily.    [provider]  budesonide-formoterol (SYMBICORT) 160-4.5 MCG/ACT inhaler Inhale 2 puffs into the lungs 2 (two) times daily. 01/17/17   Kennith Gain, MD  cetirizine (ZYRTEC) 10 MG tablet Take 1 tablet (10 mg total) daily by mouth. 04/10/17   Padgett, Rae Halsted, MD  naproxen (NAPROSYN) 500 MG tablet Take 1 tablet (500 mg total) by mouth every 12 (twelve) hours as needed. 05/26/17   Pieter Partridge, DO  olopatadine (PATANOL) 0.1 % ophthalmic solution Place 1 drop 2 (two) times daily as needed  into both eyes for allergies. 04/10/17   Kennith Gain, MD  Olopatadine HCl (PATADAY) 0.2 % SOLN Place 1 drop into both eyes 1 day or 1 dose. 01/17/17   Kennith Gain, MD  oseltamivir (TAMIFLU) 75 MG capsule Take 1 capsule (75 mg total) by mouth every 12 (twelve) hours. 07/14/17   Shelda Pal, DO  oxyCODONE-acetaminophen (ROXICET) 5-325 MG tablet Take 1 tablet by mouth every 6 (six) hours as needed for severe pain. 07/02/17   Huel Cote, NP  propranolol ER (INDERAL LA) 60 MG 24 hr capsule Take 1 capsule (60 mg total) by mouth daily. 11/18/16   Pieter Partridge, DO  SUMAtriptan 6 MG/0.5ML SOAJ Inject 6 mg into the skin as needed. 06/27/17   Pieter Partridge, DO    Family  History Family History  Problem Relation Age of Onset  . Fibromyalgia Mother   . Multiple sclerosis Mother   . Asthma Father   . Healthy Maternal Grandmother   . Healthy Maternal Grandfather   . Healthy Paternal Grandmother   . Healthy Paternal Grandfather   . Endometriosis Sister     Social History Social History   Tobacco Use  . Smoking status: Never Smoker  . Smokeless tobacco: Never Used  Substance Use Topics  . Alcohol use: No    Alcohol/week: 0.0 oz    Comment: seldom  . Drug use: No     Allergies   Patient has no known allergies.   Review of Systems Review of Systems  Constitutional: Positive for fatigue.  Respiratory: Positive for cough.      Physical Exam Triage Vital Signs ED Triage Vitals  Enc Vitals Group     BP 07/14/17 1942 110/71     Pulse Rate 07/14/17 1942 85     Resp 07/14/17 1942 18     Temp 07/14/17 1942 98.5 F (36.9 C)     Temp Source 07/14/17 1942 Oral     SpO2 07/14/17 1942 100 %   Updated Vital Signs BP 110/71 (BP Location: Left Arm)   Pulse 85   Temp 98.5 F (36.9 C) (Oral)   Resp 18   SpO2 100%   Physical Exam  Constitutional: She appears well-developed.  HENT:  Head: Normocephalic.  Right Ear: External ear normal.  Left Ear: External ear normal.  Nose: Nose normal.  Mouth/Throat: Oropharynx is clear and moist. No oropharyngeal exudate.  Eyes: Pupils are equal, round, and reactive to light.  Neck: Normal range of motion.  Cardiovascular: Normal rate and regular rhythm.  No murmur heard. Pulmonary/Chest: Effort normal and breath sounds normal. No respiratory distress.  Skin: Skin is warm.  Psychiatric: She has a normal mood and affect. Judgment normal.     UC Treatments / Results  Procedures Procedures none  Initial Impression / Assessment and Plan / UC Course  I have reviewed the triage vital signs and the nursing notes.  Pertinent labs & imaging results that were available during my care of the patient were  reviewed by me and considered in my medical decision making (see chart for details).     Pt presents w s/s's of flu that started within past 2 days. Will tx with antiviral. Continue pushing fluids, ibuprofen, Tylenol, practice good hand hygiene. Letter for work given excusing her with return on 07/17/17. F/u with PCP prn. Pt voiced understanding and agreement to the plan.   Final Clinical Impressions(s) / UC Diagnoses   Final diagnoses:  Flu-like symptoms  ED Discharge Orders        Ordered    oseltamivir (TAMIFLU) 75 MG capsule  Every 12 hours     07/14/17 2003    benzonatate (TESSALON) 100 MG capsule  3 times daily PRN     07/14/17 2003       Controlled Substance Prescriptions  Controlled Substance Registry consulted? Not Applicable   Shelda Pal, [place] 07/14/17 2045

## 2017-08-07 ENCOUNTER — Ambulatory Visit (INDEPENDENT_AMBULATORY_CARE_PROVIDER_SITE_OTHER): Payer: PRIVATE HEALTH INSURANCE | Admitting: Allergy

## 2017-08-07 ENCOUNTER — Encounter: Payer: Self-pay | Admitting: Allergy

## 2017-08-07 DIAGNOSIS — H1010 Acute atopic conjunctivitis, unspecified eye: Secondary | ICD-10-CM

## 2017-08-07 DIAGNOSIS — J4540 Moderate persistent asthma, uncomplicated: Secondary | ICD-10-CM | POA: Diagnosis not present

## 2017-08-07 DIAGNOSIS — J309 Allergic rhinitis, unspecified: Secondary | ICD-10-CM

## 2017-08-07 MED ORDER — AZELASTINE HCL 0.1 % NA SOLN: 2.0000 | Freq: Two times a day (BID) | NASAL | 5 refills | Status: DC

## 2017-08-07 MED ORDER — ALBUTEROL SULFATE HFA 108 (90 BASE) MCG/ACT IN AERS
1.0000 | INHALATION_SPRAY | Freq: Four times a day (QID) | RESPIRATORY_TRACT | 1 refills | Status: DC | PRN
Start: 2017-08-07 — End: 2020-12-20

## 2017-08-07 MED ORDER — CETIRIZINE HCL 10 MG PO TABS: 10.0000 mg | ORAL_TABLET | Freq: Every day | ORAL | 5 refills | Status: DC

## 2017-08-07 MED ORDER — BUDESONIDE-FORMOTEROL FUMARATE 160-4.5 MCG/ACT IN AERO: 2.0000 | INHALATION_SPRAY | Freq: Two times a day (BID) | RESPIRATORY_TRACT | 5 refills | Status: DC

## 2017-08-07 MED ORDER — OLOPATADINE HCL 0.1 % OP SOLN: 1.0000 [drp] | Freq: Two times a day (BID) | OPHTHALMIC | 5 refills | Status: DC | PRN

## 2017-08-07 NOTE — Patient Instructions (Addendum)
Allergic rhinoconjunctivitis    - Continue allergen avoidance measures    - continue Zyrtec 10mg  daily.  If you feel that zyrtec is no longer effective recommend change to either Xyzal 5mg  or Allegra 180mg     - use Flonase 1-2 sprays each nostril as needed for nasal congestion.  Use for 1-2 weeks at a time before stopping once symptoms improve    -  Use Astelin 2 sprays each nostril twice a day for nasal drainage    -  Use Patanol 1 drop twice a day as needed for itchy/watery/red    Eyes    - may use saline spray (simply saline) in the nose to help moisturize the nose  Asthma     - continue symbicort 160 2 puffs twice a day     -  have access to albuterol inhaler 2 puffs every 4-6 hours as needed for cough/wheeze/shortness of breath/chest tightness.  May use 15-20 minutes prior to activity.   Monitor frequency of use.    Asthma control goals:   Full participation in all desired activities (may need albuterol before activity)  Albuterol use two time or less a week on average (not counting use with activity)  Cough interfering with sleep two time or less a month  Oral steroids no more than once a year  No hospitalizations  Follow-up 6 months or sooner if needed

## 2017-08-07 NOTE — Progress Notes (Signed)
Follow-up Note  RE: Penny Thomas MRN: [id number] DOB: 08/16/1985 Date of Office Visit: 08/07/2017   History of present illness: Penny Thomas is a 32 y.o. female presenting today for follow-up of allergic rhinoconjunctivitis and asthma.  She was last seen in the office on April 10, 2017 by myself.  She denies any major health changes, surgeries or hospitalizations since her last visit.  She states she has been doing really well as far as her asthma without any flares or need for ED or urgent care visits and no oral steroids.  She takes her Symbicort 2 puffs twice a day which she states she can definitely tell a difference.  She previously was only taking it once a day.  She states she is only used her albuterol maybe 1-2 times since her last visit with [place]. She does states she is having more watery eyes and sneezing with the onset of tree pollen.  She takes her Zyrtec daily.  She has both Flonase and Astelin but states she only use 1 of these due to a dry and itchy nose.  She also has had access to Patanol which is helped with her eye symptoms but she is out of this.  Review of systems: Review of Systems  Constitutional: Negative for chills, fever and malaise/fatigue.  HENT: Negative for congestion, ear discharge, ear pain, nosebleeds, sinus pain and sore throat.   Eyes: Negative for pain, discharge and redness.  Respiratory: Negative for cough, shortness of breath and wheezing.   Cardiovascular: Negative for chest pain.  Gastrointestinal: Negative for abdominal pain, constipation, diarrhea, heartburn, nausea and vomiting.  Musculoskeletal: Negative for joint pain.  Skin: Negative for itching and rash.  Neurological: Negative for headaches.    All other systems negative unless noted above in HPI  Past medical/social/surgical/family history have been reviewed and are unchanged unless specifically indicated below.  No changes  Medication List: Allergies as of  08/07/2017   No Known Allergies     Medication List        Accurate as of 08/07/17 12:19 PM. Always use your most recent med list.          albuterol 108 (90 Base) MCG/ACT inhaler Commonly known as:  PROVENTIL HFA;VENTOLIN HFA Inhale 1-2 puffs into the lungs every 6 (six) hours as needed for wheezing or shortness of breath.   azelastine 0.1 % nasal spray Commonly known as:  ASTELIN Place 2 sprays into both nostrils 2 (two) times daily.   Biotin 1000 MCG tablet Take 2,000 mcg by mouth 3 (three) times daily.   budesonide-formoterol 160-4.5 MCG/ACT inhaler Commonly known as:  SYMBICORT Inhale 2 puffs into the lungs 2 (two) times daily.   cetirizine 10 MG tablet Commonly known as:  ZYRTEC Take 1 tablet (10 mg total) by mouth daily.   naproxen 500 MG tablet Commonly known as:  NAPROSYN Take 1 tablet (500 mg total) by mouth every 12 (twelve) hours as needed.   olopatadine 0.1 % ophthalmic solution Commonly known as:  PATANOL Place 1 drop into both eyes 2 (two) times daily as needed for allergies.   oxyCODONE-acetaminophen 5-325 MG tablet Commonly known as:  ROXICET Take 1 tablet by mouth every 6 (six) hours as needed for severe pain.   SUMAtriptan 6 MG/0.5ML Soaj Inject 6 mg into the skin as needed.       Known medication allergies: No Known Allergies   Physical examination: Blood pressure 102/64, pulse 74, resp. rate 18, SpO2 98 %.  General: Alert, interactive, in no acute distress. HEENT: PERRLA, TMs pearly gray, turbinates non-edematous without discharge, post-pharynx non erythematous. Neck: Supple without lymphadenopathy. Lungs: Clear to auscultation without wheezing, rhonchi or rales. {no increased work of breathing. CV: Normal S1, S2 without murmurs. Abdomen: Nondistended, nontender. Skin: Warm and dry, without lesions or rashes. Extremities:  No clubbing, cyanosis or edema. Neuro:   Grossly intact.  Diagnositics/Labs:  Spirometry: FEV1: 2.65L  89%,  FVC: 2.91L  82%, ratio consistent with nonobstructive pattern   Assessment and plan:   Allergic rhinoconjunctivitis    - Continue allergen avoidance measures    - continue Zyrtec 10mg  daily.  If you feel that zyrtec is no longer effective recommend change to either Xyzal 5mg  or Allegra 180mg     - use Flonase 1-2 sprays each nostril as needed for nasal congestion.  Use for 1-2 weeks at a time before stopping once symptoms improve    -  Use Astelin 2 sprays each nostril twice a day for nasal drainage    -  Use Patanol 1 drop twice a day as needed for itchy/watery/red    Eyes    - may use saline spray (simply saline) in the nose to help moisturize the nose  Asthma, moderate persistent -well controlled     - continue symbicort 160 2 puffs twice a day     -  have access to albuterol inhaler 2 puffs every 4-6 hours as needed for cough/wheeze/shortness of breath/chest tightness.  May use 15-20 minutes prior to activity.   Monitor frequency of use.    Asthma control goals:   Full participation in all desired activities (may need albuterol before activity)  Albuterol use two time or less a week on average (not counting use with activity)  Cough interfering with sleep two time or less a month  Oral steroids no more than once a year  No hospitalizations  Follow-up 6 months or sooner if needed   I appreciate the opportunity to take part in [place] care. Please do not hesitate to contact me with questions.  Sincerely,   Prudy Feeler, MD Allergy/Immunology [facility]

## 2017-08-19 ENCOUNTER — Telehealth: Payer: Self-pay | Admitting: *Deleted

## 2017-08-19 ENCOUNTER — Ambulatory Visit: Payer: PRIVATE HEALTH INSURANCE | Admitting: Women's Health

## 2017-08-19 ENCOUNTER — Encounter: Payer: Self-pay | Admitting: Women's Health

## 2017-08-19 VITALS — BP 120/82

## 2017-08-19 DIAGNOSIS — R1030 Lower abdominal pain, unspecified: Secondary | ICD-10-CM

## 2017-08-19 DIAGNOSIS — N898 Other specified noninflammatory disorders of vagina: Secondary | ICD-10-CM | POA: Diagnosis not present

## 2017-08-19 LAB — WET PREP FOR TRICH, YEAST, CLUE

## 2017-08-19 MED ORDER — ELAGOLIX SODIUM 150 MG PO TABS: 150.0000 mg | ORAL_TABLET | Freq: Every morning | ORAL | 6 refills | Status: DC

## 2017-08-19 MED ORDER — METRONIDAZOLE 500 MG PO TABS: 500.0000 mg | ORAL_TABLET | Freq: Two times a day (BID) | ORAL | 0 refills | Status: DC

## 2017-08-19 MED ORDER — OXYCODONE-ACETAMINOPHEN 5-325 MG PO TABS: 1.0000 | ORAL_TABLET | Freq: Four times a day (QID) | ORAL | 0 refills | Status: DC | PRN

## 2017-08-19 NOTE — Telephone Encounter (Signed)
Medication approved through 02/19/18 per [employer]

## 2017-08-19 NOTE — Patient Instructions (Signed)

## 2017-08-19 NOTE — Progress Notes (Signed)
32 year old MBF G3P4 presents with complaint of abdominal/pelvic pain started 3 days ago endometriosis type pain rates 6 today was an 8.  History of debilitating menstrual cycles using Roxicet sparingly .  2013 endometrial ablation became amenorrheic and has had about a 75%  decrease in dysmenorrhea/endometriosis pain after ablation.  BTL.  Continues with monthly dysmenorrhea despite amenorrhea.  Reports had to take a half of a Roxicet at work this past weekend which she has never had to do be able to complete her shift.  CNA at [employer].  Denies fever, urinary symptoms, vaginal itching but has had an increase in discharge.  Medical problems include migraines and asthma.  Exam: Appears well.  Abdomen soft without rebound or radiation slight tenderness in low pelvic area with deep palpation.  External genitalia within normal limits, speculum exam moderate amount of milky discharge with mild odor noted, wet prep positive for clues, TNTC bacteria.  Bimanual no CMT but tenderness both adnexal without fullness.  Pelvic pain most likely related to endometriosis Bacterial vaginosis  Plan: 500 twice daily for 7 days, alcohol precautions reviewed.  Instructed to call if no relief of discharge.  Options for endometriosis type pain reviewed, will try [name], 150 mg daily, reviewed can be used for up to 6 months, reviewed it is a newer drug that is to decrease endometriosis type pain.  States is willing to try something that may help to decrease the pain.  Directed to call if pelvic pain persists.

## 2017-08-19 NOTE — Telephone Encounter (Signed)
Prior authorization for Orlissa 150 mg tablet done via cover my meds, will wait for response.

## 2017-08-21 ENCOUNTER — Other Ambulatory Visit: Payer: Self-pay | Admitting: Neurology

## 2017-09-19 ENCOUNTER — Encounter: Payer: Self-pay | Admitting: Neurology

## 2017-11-08 ENCOUNTER — Other Ambulatory Visit: Payer: Self-pay | Admitting: Neurology

## 2017-11-20 ENCOUNTER — Telehealth: Payer: Self-pay

## 2017-11-20 NOTE — Telephone Encounter (Signed)
Appointment scheduled.

## 2017-11-20 NOTE — Telephone Encounter (Signed)
Copied from [place] [id number]. Topic: Appointment Scheduling - Scheduling Inquiry for Clinic >> Nov 20, 2017  1:18 PM Boyd Kerbs wrote: Reason for CRM: pt has transfer of care appt on 7/15 with [facility] She is needing to have TB test done and wants to see if can come in next week (7/1 week) but needs order put in.  Please call pt and let her know  >> Nov 20, 2017  2:04 PM Morphies, Isidoro Donning wrote: Is it okay for patient to come in to have this done as a nurse visit?

## 2017-12-01 ENCOUNTER — Ambulatory Visit (INDEPENDENT_AMBULATORY_CARE_PROVIDER_SITE_OTHER): Payer: Self-pay | Admitting: *Deleted

## 2017-12-01 DIAGNOSIS — Z111 Encounter for screening for respiratory tuberculosis: Secondary | ICD-10-CM

## 2017-12-01 NOTE — Progress Notes (Signed)
ppd

## 2017-12-03 ENCOUNTER — Encounter: Payer: Self-pay | Admitting: *Deleted

## 2017-12-03 LAB — TB SKIN TEST
Induration: 0 mm
TB Skin Test: NEGATIVE

## 2017-12-08 ENCOUNTER — Encounter: Payer: PRIVATE HEALTH INSURANCE | Admitting: Family

## 2017-12-08 DIAGNOSIS — Z0289 Encounter for other administrative examinations: Secondary | ICD-10-CM

## 2017-12-09 ENCOUNTER — Encounter: Payer: Self-pay | Admitting: Family

## 2017-12-11 ENCOUNTER — Encounter: Payer: Self-pay | Admitting: *Deleted

## 2017-12-15 ENCOUNTER — Ambulatory Visit: Payer: PRIVATE HEALTH INSURANCE | Admitting: Neurology

## 2018-01-07 NOTE — Telephone Encounter (Signed)
Penny Thomas I did call patient assistance program and they will be mailing information for this program.

## 2018-01-22 ENCOUNTER — Telehealth: Payer: Self-pay | Admitting: *Deleted

## 2018-01-22 NOTE — Telephone Encounter (Signed)
Follow up from previous my chart patient message on 01/06/18. Patient has been approved for Orlissa up to 1 year per Patient Assistance Program [phone].

## 2018-02-05 ENCOUNTER — Ambulatory Visit: Payer: PRIVATE HEALTH INSURANCE | Admitting: Allergy

## 2018-02-20 ENCOUNTER — Other Ambulatory Visit: Payer: Self-pay

## 2018-02-21 ENCOUNTER — Other Ambulatory Visit: Payer: Self-pay

## 2018-02-23 ENCOUNTER — Telehealth: Payer: Self-pay | Admitting: Neurology

## 2018-02-23 NOTE — Telephone Encounter (Signed)
Patient is calling in needing a refill on her Propranolol medication. She is using the [employer]. If you need to call her it's [phone]. Thanks!

## 2018-02-23 NOTE — Telephone Encounter (Signed)
This medication has been sent. 

## 2018-02-24 ENCOUNTER — Other Ambulatory Visit: Payer: Self-pay | Admitting: Neurology

## 2018-02-24 MED FILL — PROPRANOLOL HCL ER 60 MG CP: 60 | 30 days supply | Qty: 30 | Fill #0

## 2018-02-26 ENCOUNTER — Other Ambulatory Visit: Payer: Self-pay

## 2018-02-28 MED ORDER — OXYCODONE-ACETAMINOPHEN 5-325 MG PO TABS: 1.0000 | ORAL_TABLET | Freq: Four times a day (QID) | ORAL | 0 refills | Status: DC | PRN

## 2018-02-28 NOTE — Telephone Encounter (Signed)
Ok for refill? 

## 2018-03-02 NOTE — Telephone Encounter (Signed)
Emailed patient back in My Chart to let her know Rx is ready for pick up at front desk.

## 2018-03-06 NOTE — Progress Notes (Signed)
NEUROLOGY FOLLOW UP OFFICE NOTE  Penny Thomas [id number]  HISTORY OF PRESENT ILLNESS: Penny Thomas is a 32 year old right-handed female who follows up for migraine.  UPDATE: Intensity:  Mild to moderate Duration:  Unknown.  She goes to sleep right after the sumatriptan shot. Frequency:  4 days a week (1 to 2 days a week severe migraine) Frequency of abortive medication: Naproxen 500mg  Rescue protocol:  Naproxen 500mg  first line (at work) and will take sumatriptan [place] at home. Current NSAIDS:  Naproxen 500mg  Current analgesics:  None Current triptans:  Sumatriptan [place] Current ergotamine:  none Current anti-emetic:  none Current muscle relaxants:  none Current anti-anxiolytic:  none Current sleep aide:  none Current Antihypertensive medications:  Propranolol ER 60mg  Current Antidepressant medications:  none Current Anticonvulsant medications:  none Current anti-CGRP:  none Current Vitamins/Herbal/Supplements:  none Current Antihistamines/Decongestants:  none Other therapy:  none Other medication:  none  Caffeine:  no Alcohol:  seldom Smoker:  no Diet:  hydrates Exercise:  no Depression:  no; Anxiety:  no Other pain:  no Sleep hygiene:  okay  HISTORY: Onset:  32 years old Location:  Varies (bi-frontal, back of head) Quality:  Squeezing/"feels like I am hit in the head with a hammer" Initial Intensity:  9-10/10 Aura:  blurred vision Prodrome:  no Associated symptoms:  Nausea, dizziness, photophobia, osmophobia Initial Duration:  1 to 3 days initiail Frequency:  3 to 4 days per week (16 headache days per week) Triggers/exacerbating factors:  Menstrual cycle, caffeine, scents (perfume, deoderant, hand sanitizer), change in weather Relieving factors:  Laying down Activity:  Lays down if severe   Past NSAIDS:  Mobic, ibuprofen 800mg , Cambia Past analgesics:  Hydrocodone-acetaminophen, tramadol 50mg , Roxicet, Excedrin Migraine Past abortive  triptans:  sumatriptan 100mg , Maxalt 10mg , Relpax 20mg  Past ergot:  none Past muscle relaxants:  Flexeril Past anti-nausea:  Zofran 4mg , promethazine 25mg  Past antihypertensive medications:  no Past antidepressant medications:  amitriptyline 50mg  (weight gain) Past anticonvulsant medications:  topiramate 150mg  (effective but caused hair loss) Past vitamins/Herbal/Supplements:  no Past antihistamines/decongestants:  no Other past medications:  no   Family history of headache:  Mom, sister, grandmother  PAST MEDICAL HISTORY: Past Medical History:  Diagnosis Date  . Allergy   . Anemia    takes iron supplement  . Asthma   . Asthma    prn inhaler  . Blood transfusion 2007  . Blood transfusion 2010  . Blood transfusion without reported diagnosis   . 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 Hemorrhage in uterus 01-20-09  . Migraines   . 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 Seasonal allergies   . Sickle cell anemia (HCC)    trait  . Sickle cell trait ([place])     MEDICATIONS: Current Outpatient Medications on File Prior to Visit  Medication Sig Dispense Refill  . albuterol (PROVENTIL HFA;VENTOLIN HFA) 108 (90 Base) MCG/ACT inhaler Inhale 1-2 puffs into the lungs every 6 (six) hours as needed for wheezing or shortness of breath. 1 Inhaler 1  . azelastine (ASTELIN) 0.1 % nasal spray Place 2 sprays into both nostrils 2 (two) times daily. 30 mL 5  . Biotin 1000 MCG tablet Take 2,000 mcg by mouth 3 (three) times daily.    . budesonide-formoterol (SYMBICORT) 160-4.5 MCG/ACT inhaler Inhale 2 puffs into the lungs 2 (two) times daily. 1 Inhaler 5  . cetirizine (ZYRTEC) 10 MG tablet Take 1 tablet (10 mg total) by mouth daily. 30 tablet 5  . Elagolix Sodium (  [name]) 150 MG TABS Take 150 mg by mouth every morning. 30 tablet 6  . metroNIDAZOLE (FLAGYL) 500 MG tablet Take 1 tablet (500 mg total) by mouth 2 (two) times daily. 14 tablet 0  . naproxen (NAPROSYN) 500 MG tablet Take 1 tablet (500 mg  total) by mouth every 12 (twelve) hours as needed. 180 tablet 3  . olopatadine (PATANOL) 0.1 % ophthalmic solution Place 1 drop into both eyes 2 (two) times daily as needed for allergies. 5 mL 5  . oxyCODONE-acetaminophen (ROXICET) 5-325 MG tablet Take 1 tablet by mouth every 6 (six) hours as needed for severe pain. 30 tablet 0  . propranolol ER (INDERAL LA) 60 MG 24 hr capsule TAKE 1 CAPSULE BY MOUTH DAILY. 90 capsule 0  . propranolol ER (INDERAL LA) 60 MG 24 hr capsule TAKE 1 CAPSULE BY MOUTH DAILY. 30 capsule 2  . SUMAtriptan 6 MG/0.5ML SOAJ Inject 6 mg into the skin as needed. 6 Syringe 3   No current facility-administered medications on file prior to visit.     ALLERGIES: No Known Allergies  FAMILY HISTORY: Family History  Problem Relation Age of Onset  . Fibromyalgia Mother   . Multiple sclerosis Mother   . Asthma Father   . Healthy Maternal Grandmother   . Healthy Maternal Grandfather   . Healthy Paternal Grandmother   . Healthy Paternal Grandfather   . Endometriosis Sister    SOCIAL HISTORY: Social History   Socioeconomic History  . Marital status: Married    Spouse name: Not on file  . Number of children: 4  . Years of education: 55  . Highest education level: Not on file  Occupational History  . Occupation: NT/Transporter  Social Needs  . Financial resource strain: Not on file  . Food insecurity:    Worry: Not on file    Inability: Not on file  . Transportation needs:    Medical: Not on file    Non-medical: Not on file  Tobacco Use  . Smoking status: Never Smoker  . Smokeless tobacco: Never Used  Substance and Sexual Activity  . Alcohol use: No    Alcohol/week: 0.0 standard drinks    Comment: seldom  . Drug use: No  . Sexual activity: Yes    Birth control/protection: Surgical    Comment: TUBAL LIGATION, INTERCOURSE AGE 35, SEXUAL PARTNERS 5  Lifestyle  . Physical activity:    Days per week: Not on file    Minutes per session: Not on file  . Stress:  Not on file  Relationships  . Social connections:    Talks on phone: Not on file    Gets together: Not on file    Attends religious service: Not on file    Active member of club or organization: Not on file    Attends meetings of clubs or organizations: Not on file    Relationship status: Not on file  . Intimate partner violence:    Fear of current or ex partner: Not on file    Emotionally abused: Not on file    Physically abused: Not on file    Forced sexual activity: Not on file  Other Topics Concern  . Not on file  Social History Narrative   Fun: Baking, playing with the children.   Denies abuse and feels safe at home.         REVIEW OF SYSTEMS: Constitutional: No fevers, chills, or sweats, no generalized fatigue, change in appetite Eyes: No visual changes, double vision,  eye pain Ear, nose and throat: No hearing loss, ear pain, nasal congestion, sore throat Cardiovascular: No chest pain, palpitations Respiratory:  No shortness of breath at rest or with exertion, wheezes GastrointestinaI: No nausea, vomiting, diarrhea, abdominal pain, fecal incontinence Genitourinary:  No dysuria, urinary retention or frequency Musculoskeletal:  No neck pain, back pain Integumentary: No rash, pruritus, skin lesions Neurological: as above Psychiatric: No depression, insomnia, anxiety Endocrine: No palpitations, fatigue, diaphoresis, mood swings, change in appetite, change in weight, increased thirst Hematologic/Lymphatic:  No purpura, petechiae. Allergic/Immunologic: no itchy/runny eyes, nasal congestion, recent allergic reactions, rashes  PHYSICAL EXAM: Blood pressure 128/62, pulse 91, height 5\' 7"  (1.702 m), weight 174 lb (78.9 kg), SpO2 98 %. General: No acute distress.  Patient appears well-groomed.  Head:  Normocephalic/atraumatic Eyes:  Fundi examined but not visualized Neck: supple, no paraspinal tenderness, full range of motion Heart:  Regular rate and rhythm Lungs:  Clear to  auscultation bilaterally Back: No paraspinal tenderness Neurological Exam: alert and oriented to person, place, and time. Attention span and concentration intact, recent and remote memory intact, fund of knowledge intact.  Speech fluent and not dysarthric, language intact.  CN II-XII intact. Bulk and tone normal, muscle strength 5/5 throughout.  Sensation to light touch, temperature and vibration intact.  Deep tendon reflexes 2+ throughout, toes downgoing.  Finger to nose and heel to shin testing intact.  Gait normal, Romberg negative.  IMPRESSION: Chronic migraine without aura, without status migrainosus, not intractable  PLAN: 1.  For preventative management, increase propranolol ER to 80mg  daily.  We can increase to 120mg  daily in 6 to 8 weeks if needed. 2.  For abortive therapy, naproxen and sumatriptan [place] 3.  Limit use of pain relievers to no more than 2 days out of week to prevent risk of rebound or medication-overuse headache. 4.  Keep headache diary 5.  Exercise, hydration, caffeine cessation, sleep hygiene, monitor for and avoid triggers 6.  Consider:  magnesium citrate 400mg  daily, riboflavin 400mg  daily, and coenzyme Q10 100mg  three times daily 7.  Follow up in 5 months.  Metta Clines, DO

## 2018-03-09 ENCOUNTER — Encounter: Payer: Self-pay | Admitting: Neurology

## 2018-03-09 ENCOUNTER — Ambulatory Visit: Payer: 59 | Admitting: Neurology

## 2018-03-09 VITALS — BP 128/62 | HR 91 | Ht 67.0 in | Wt 174.0 lb

## 2018-03-09 DIAGNOSIS — G43709 Chronic migraine without aura, not intractable, without status migrainosus: Secondary | ICD-10-CM | POA: Diagnosis not present

## 2018-03-09 DIAGNOSIS — G43109 Migraine with aura, not intractable, without status migrainosus: Secondary | ICD-10-CM

## 2018-03-09 MED ORDER — PROPRANOLOL HCL ER 80 MG PO CP24: 80.0000 mg | ORAL_CAPSULE | Freq: Every day | ORAL | 5 refills | Status: DC

## 2018-03-09 NOTE — Patient Instructions (Addendum)
Migraine Recommendations: 1.  Increase propranolol ER to 80mg  daily.  Contact me in 8 weeks with update and we can adjust dose if needed. 2.  Take sumatriptan shot at earliest onset of headache.  May repeat dose once in 1 hour if needed.  Do not exceed two doses in 24 hours.  May also take naproxen 500mg  3.  Limit use of pain relievers to no more than 2 days out of the week.  These medications include acetaminophen, ibuprofen, triptans and narcotics.  This will help reduce risk of rebound headaches. 4.  Be aware of common food triggers such as processed sweets, processed foods with nitrites (such as deli meat, hot dogs, sausages), foods with MSG, alcohol (such as wine), chocolate, certain cheeses, certain fruits (dried fruits, bananas, some citrus fruit), vinegar, diet soda. 4.  Avoid caffeine 5.  Routine exercise 6.  Proper sleep hygiene 7.  Stay adequately hydrated with water 8.  Keep a headache diary. 9.  Maintain proper stress management. 10.  Do not skip meals. 11.  Consider supplements:  Magnesium citrate 400mg  to 600mg  daily, riboflavin 400mg , Coenzyme Q 10 100mg  three times daily 12.  Follow up in 5 months

## 2018-03-09 NOTE — Telephone Encounter (Signed)
Penny Thomas any OTC cream you recommend patient can try for this area?

## 2018-03-10 ENCOUNTER — Telehealth: Payer: Self-pay | Admitting: Neurology

## 2018-03-10 MED ORDER — PROPRANOLOL HCL ER 80 MG PO CP24: 80.0000 mg | ORAL_CAPSULE | Freq: Every day | ORAL | 5 refills | Status: DC

## 2018-03-10 MED ORDER — NAPROXEN 500 MG PO TABS: 500.0000 mg | ORAL_TABLET | Freq: Two times a day (BID) | ORAL | 5 refills | Status: DC | PRN

## 2018-03-10 MED FILL — OXYCODONE-ACETAMINOPHEN 5-3: 5-325 | 7 days supply | Qty: 30 | Fill #0

## 2018-03-10 MED FILL — NAPROXEN 500 MG TABLET: 500 | 30 days supply | Qty: 60 | Fill #0

## 2018-03-10 NOTE — Telephone Encounter (Signed)
Patient called and stated that the medications Propanolol and Naproxen were sent to the wrong pharmacy. IT should be the [facility]. Please resubmit these. Thanks!

## 2018-03-11 MED FILL — PROPRANOLOL ER 80 MG CAP: 80 | 30 days supply | Qty: 30 | Fill #0

## 2018-04-07 MED FILL — PROPRANOLOL ER 80 MG CAP: 80 | 30 days supply | Qty: 30 | Fill #1

## 2018-05-09 MED FILL — PROPRANOLOL ER 80 MG CAP: 80 | 30 days supply | Qty: 30 | Fill #2

## 2018-05-18 MED FILL — PROMETHAZINE W/DM SYRUP: 6.25-15 | 7 days supply | Qty: 118 | Fill #0

## 2018-05-27 ENCOUNTER — Telehealth: Payer: 59 | Admitting: Family

## 2018-05-27 DIAGNOSIS — B373 Candidiasis of vulva and vagina: Secondary | ICD-10-CM

## 2018-05-27 DIAGNOSIS — B3731 Acute candidiasis of vulva and vagina: Secondary | ICD-10-CM

## 2018-05-27 MED ORDER — FLUCONAZOLE 150 MG PO TABS: 150.0000 mg | ORAL_TABLET | ORAL | 0 refills | Status: DC | PRN

## 2018-05-27 NOTE — Progress Notes (Signed)

## 2018-05-27 NOTE — Addendum Note (Signed)
Addended by: Evelina Dun A on: 05/27/2018 10:15 AM   Modules accepted: Orders

## 2018-05-28 ENCOUNTER — Other Ambulatory Visit: Payer: Self-pay | Admitting: Women's Health

## 2018-05-28 MED ORDER — FLUCONAZOLE 150 MG PO TABS: ORAL_TABLET | ORAL | 0 refills | Status: DC

## 2018-06-17 MED FILL — PROPRANOLOL ER 80 MG CAP: 80 | 30 days supply | Qty: 30 | Fill #3

## 2018-07-06 ENCOUNTER — Telehealth: Payer: 59 | Admitting: Family

## 2018-07-06 DIAGNOSIS — M5440 Lumbago with sciatica, unspecified side: Secondary | ICD-10-CM | POA: Diagnosis not present

## 2018-07-06 MED ORDER — BACLOFEN 10 MG PO TABS: 10.0000 mg | ORAL_TABLET | Freq: Three times a day (TID) | ORAL | 0 refills | Status: DC

## 2018-07-06 MED ORDER — NAPROXEN 500 MG PO TABS: 500.0000 mg | ORAL_TABLET | Freq: Two times a day (BID) | ORAL | 0 refills | Status: DC

## 2018-07-06 MED FILL — BACLOFEN 10 MG TABS: 10 | 10 days supply | Qty: 30 | Fill #0

## 2018-07-06 MED FILL — NAPROXEN 500 MG TABLET: 500 | 15 days supply | Qty: 30 | Fill #0

## 2018-07-06 NOTE — Progress Notes (Signed)

## 2018-07-07 ENCOUNTER — Other Ambulatory Visit: Payer: Self-pay | Admitting: Women's Health

## 2018-07-07 MED ORDER — OXYCODONE-ACETAMINOPHEN 5-325 MG PO TABS: 1.0000 | ORAL_TABLET | Freq: Four times a day (QID) | ORAL | 0 refills | Status: DC | PRN

## 2018-07-07 MED FILL — OXYCODONE-ACETAMINOPHEN 5-3: 5-325 | 7 days supply | Qty: 30 | Fill #0

## 2018-07-14 ENCOUNTER — Encounter: Payer: Self-pay | Admitting: Women's Health

## 2018-07-14 ENCOUNTER — Ambulatory Visit (INDEPENDENT_AMBULATORY_CARE_PROVIDER_SITE_OTHER): Payer: 59 | Admitting: Women's Health

## 2018-07-14 VITALS — BP 122/80 | Ht 67.0 in | Wt 173.0 lb

## 2018-07-14 DIAGNOSIS — Z01419 Encounter for gynecological examination (general) (routine) without abnormal findings: Secondary | ICD-10-CM

## 2018-07-14 DIAGNOSIS — R7309 Other abnormal glucose: Secondary | ICD-10-CM | POA: Diagnosis not present

## 2018-07-14 MED ORDER — ELAGOLIX SODIUM 150 MG PO TABS: 150.0000 mg | ORAL_TABLET | Freq: Every morning | ORAL | 4 refills | Status: DC

## 2018-07-14 MED ORDER — OXYCODONE-ACETAMINOPHEN 5-325 MG PO TABS: 1.0000 | ORAL_TABLET | Freq: Four times a day (QID) | ORAL | 0 refills | Status: DC | PRN

## 2018-07-14 NOTE — Patient Instructions (Addendum)
Health Maintenance, Female Adopting a healthy lifestyle and getting preventive care can go a long way to promote health and wellness. Talk with your health care provider about what schedule of regular examinations is right for you. This is a good chance for you to check in with your provider about disease prevention and staying healthy. In between checkups, there are plenty of things you can do on your own. Experts have done a lot of research about which lifestyle changes and preventive measures are most likely to keep you healthy. Ask your health care provider for more information. Weight and diet Eat a healthy diet  Be sure to include plenty of vegetables, fruits, low-fat dairy products, and lean protein.  Do not eat a lot of foods high in solid fats, added sugars, or salt.  Get regular exercise. This is one of the most important things you can do for your health. ? Most adults should exercise for at least 150 minutes each week. The exercise should increase your heart rate and make you sweat (moderate-intensity exercise). ? Most adults should also do strengthening exercises at least twice a week. This is in addition to the moderate-intensity exercise. Maintain a healthy weight  Body mass index (BMI) is a measurement that can be used to identify possible weight problems. It estimates body fat based on height and weight. Your health care provider can help determine your BMI and help you achieve or maintain a healthy weight.  For females 20 years of age and older: ? A BMI below 18.5 is considered underweight. ? A BMI of 18.5 to 24.9 is normal. ? A BMI of 25 to 29.9 is considered overweight. ? A BMI of 30 and above is considered obese. Watch levels of cholesterol and blood lipids  You should start having your blood tested for lipids and cholesterol at 33 years of age, then have this test every 5 years.  You may need to have your cholesterol levels checked more often if: ? Your lipid or  cholesterol levels are high. ? You are older than 33 years of age. ? You are at high risk for heart disease. Cancer screening Lung Cancer  Lung cancer screening is recommended for adults 55-80 years old who are at high risk for lung cancer because of a history of smoking.  A yearly low-dose CT scan of the lungs is recommended for people who: ? Currently smoke. ? Have quit within the past 15 years. ? Have at least a 30-pack-year history of smoking. A pack year is smoking an average of one pack of cigarettes a day for 1 year.  Yearly screening should continue until it has been 15 years since you quit.  Yearly screening should stop if you develop a health problem that would prevent you from having lung cancer treatment. Breast Cancer  Practice breast self-awareness. This means understanding how your breasts normally appear and feel.  It also means doing regular breast self-exams. Let your health care provider know about any changes, no matter how small.  If you are in your 20s or 30s, you should have a clinical breast exam (CBE) by a health care provider every 1-3 years as part of a regular health exam.  If you are 40 or older, have a CBE every year. Also consider having a breast X-ray (mammogram) every year.  If you have a family history of breast cancer, talk to your health care provider about genetic screening.  If you are at high risk for breast cancer, talk   to your health care provider about having an MRI and a mammogram every year.  Breast cancer gene (BRCA) assessment is recommended for women who have family members with BRCA-related cancers. BRCA-related cancers include: ? Breast. ? Ovarian. ? Tubal. ? Peritoneal cancers.  Results of the assessment will determine the need for genetic counseling and BRCA1 and BRCA2 testing. Cervical Cancer Your health care provider may recommend that you be screened regularly for cancer of the pelvic organs (ovaries, uterus, and vagina).  This screening involves a pelvic examination, including checking for microscopic changes to the surface of your cervix (Pap test). You may be encouraged to have this screening done every 3 years, beginning at age 21.  For women ages 30-65, health care providers may recommend pelvic exams and Pap testing every 3 years, or they may recommend the Pap and pelvic exam, combined with testing for human papilloma virus (HPV), every 5 years. Some types of HPV increase your risk of cervical cancer. Testing for HPV may also be done on women of any age with unclear Pap test results.  Other health care providers may not recommend any screening for nonpregnant women who are considered low risk for pelvic cancer and who do not have symptoms. Ask your health care provider if a screening pelvic exam is right for you.  If you have had past treatment for cervical cancer or a condition that could lead to cancer, you need Pap tests and screening for cancer for at least 20 years after your treatment. If Pap tests have been discontinued, your risk factors (such as having a new sexual partner) need to be reassessed to determine if screening should resume. Some women have medical problems that increase the chance of getting cervical cancer. In these cases, your health care provider may recommend more frequent screening and Pap tests. Colorectal Cancer  This type of cancer can be detected and often prevented.  Routine colorectal cancer screening usually begins at 33 years of age and continues through 33 years of age.  Your health care provider may recommend screening at an earlier age if you have risk factors for colon cancer.  Your health care provider may also recommend using home test kits to check for hidden blood in the stool.  A small camera at the end of a tube can be used to examine your colon directly (sigmoidoscopy or colonoscopy). This is done to check for the earliest forms of colorectal cancer.  Routine  screening usually begins at age 50.  Direct examination of the colon should be repeated every 5-10 years through 33 years of age. However, you may need to be screened more often if early forms of precancerous polyps or small growths are found. Skin Cancer  Check your skin from head to toe regularly.  Tell your health care provider about any new moles or changes in moles, especially if there is a change in a mole's shape or color.  Also tell your health care provider if you have a mole that is larger than the size of a pencil eraser.  Always use sunscreen. Apply sunscreen liberally and repeatedly throughout the day.  Protect yourself by wearing long sleeves, pants, a wide-brimmed hat, and sunglasses whenever you are outside. Heart disease, diabetes, and high blood pressure  High blood pressure causes heart disease and increases the risk of stroke. High blood pressure is more likely to develop in: ? People who have blood pressure in the high end of the normal range (130-139/85-89 mm Hg). ? People   who are overweight or obese. ? People who are African American.  If you are 84-22 years of age, have your blood pressure checked every 3-5 years. If you are 67 years of age or older, have your blood pressure checked every year. You should have your blood pressure measured twice-once when you are at a hospital or clinic, and once when you are not at a hospital or clinic. Record the average of the two measurements. To check your blood pressure when you are not at a hospital or clinic, you can use: ? An automated blood pressure machine at a pharmacy. ? A home blood pressure monitor.  If you are between 52 years and 3 years old, ask your health care provider if you should take aspirin to prevent strokes.  Have regular diabetes screenings. This involves taking a blood sample to check your fasting blood sugar level. ? If you are at a normal weight and have a low risk for diabetes, have this test once  every three years after 33 years of age. ? If you are overweight and have a high risk for diabetes, consider being tested at a younger age or more often. Preventing infection Hepatitis B  If you have a higher risk for hepatitis B, you should be screened for this virus. You are considered at high risk for hepatitis B if: ? You were born in a country where hepatitis B is common. Ask your health care provider which countries are considered high risk. ? Your parents were born in a high-risk country, and you have not been immunized against hepatitis B (hepatitis B vaccine). ? You have HIV or AIDS. ? You use needles to inject street drugs. ? You live with someone who has hepatitis B. ? You have had sex with someone who has hepatitis B. ? You get hemodialysis treatment. ? You take certain medicines for conditions, including cancer, organ transplantation, and autoimmune conditions. Hepatitis C  Blood testing is recommended for: ? Everyone born from [birth year] through 1965. ? Anyone with known risk factors for hepatitis C. Sexually transmitted infections (STIs)  You should be screened for sexually transmitted infections (STIs) including gonorrhea and chlamydia if: ? You are sexually active and are younger than 33 years of age. ? You are older than 33 years of age and your health care provider tells you that you are at risk for this type of infection. ? Your sexual activity has changed since you were last screened and you are at an increased risk for chlamydia or gonorrhea. Ask your health care provider if you are at risk.  If you do not have HIV, but are at risk, it may be recommended that you take a prescription medicine daily to prevent HIV infection. This is called pre-exposure prophylaxis (PrEP). You are considered at risk if: ? You are sexually active and do not regularly use condoms or know the HIV status of your partner(s). ? You take drugs by injection. ? You are sexually active with a partner  who has HIV. Talk with your health care provider about whether you are at high risk of being infected with HIV. If you choose to begin PrEP, you should first be tested for HIV. You should then be tested every 3 months for as long as you are taking PrEP. Pregnancy  If you are premenopausal and you may become pregnant, ask your health care provider about preconception counseling.  If you may become pregnant, take 400 to 800 micrograms (mcg) of folic acid every  day.  If you want to prevent pregnancy, talk to your health care provider about birth control (contraception). Osteoporosis and menopause  Osteoporosis is a disease in which the bones lose minerals and strength with aging. This can result in serious bone fractures. Your risk for osteoporosis can be identified using a bone density scan.  If you are 65 years of age or older, or if you are at risk for osteoporosis and fractures, ask your health care provider if you should be screened.  Ask your health care provider whether you should take a calcium or vitamin D supplement to lower your risk for osteoporosis.  Menopause may have certain physical symptoms and risks.  Hormone replacement therapy may reduce some of these symptoms and risks. Talk to your health care provider about whether hormone replacement therapy is right for you. Follow these instructions at home:  Schedule regular health, dental, and eye exams.  Stay current with your immunizations.  Do not use any tobacco products including cigarettes, chewing tobacco, or electronic cigarettes.  If you are pregnant, do not drink alcohol.  If you are breastfeeding, limit how much and how often you drink alcohol.  Limit alcohol intake to no more than 1 drink per day for nonpregnant women. One drink equals 12 ounces of beer, 5 ounces of wine, or 1 ounces of hard liquor.  Do not use street drugs.  Do not share needles.  Ask your health care provider for help if you need support  or information about quitting drugs.  Tell your health care provider if you often feel depressed.  Tell your health care provider if you have ever been abused or do not feel safe at home. This information is not intended to replace advice given to you by your health care provider. Make sure you discuss any questions you have with your health care provider. Document Released: 11/26/2010 Document Revised: 10/19/2015 Document Reviewed: 02/14/2015 Elsevier Interactive Patient Education  2019 [employer]  Carbohydrate Counting for Diabetes Mellitus, Adult  Carbohydrate counting is a method of keeping track of how many carbohydrates you eat. Eating carbohydrates naturally increases the amount of sugar (glucose) in the blood. Counting how many carbohydrates you eat helps keep your blood glucose within normal limits, which helps you manage your diabetes (diabetes mellitus). It is important to know how many carbohydrates you can safely have in each meal. This is different for every person. A diet and nutrition specialist (registered dietitian) can help you make a meal plan and calculate how many carbohydrates you should have at each meal and snack. Carbohydrates are found in the following foods:  Grains, such as breads and cereals.  Dried beans and soy products.  Starchy vegetables, such as potatoes, peas, and corn.  Fruit and fruit juices.  Milk and yogurt.  Sweets and snack foods, such as cake, cookies, candy, chips, and soft drinks. How do I count carbohydrates? There are two ways to count carbohydrates in food. You can use either of the methods or a combination of both. Reading "Nutrition Facts" on packaged food The "Nutrition Facts" list is included on the labels of almost all packaged foods and beverages in the [place] It includes:  The serving size.  Information about nutrients in each serving, including the grams (g) of carbohydrate per serving. To use the "Nutrition  Facts":  Decide how many servings you will have.  Multiply the number of servings by the number of carbohydrates per serving.  The resulting number is the total amount of   carbohydrates that you will be having. Learning standard serving sizes of other foods When you eat carbohydrate foods that are not packaged or do not include "Nutrition Facts" on the label, you need to measure the servings in order to count the amount of carbohydrates:  Measure the foods that you will eat with a food scale or measuring cup, if needed.  Decide how many standard-size servings you will eat.  Multiply the number of servings by 15. Most carbohydrate-rich foods have about 15 g of carbohydrates per serving. ? For example, if you eat 8 oz (170 g) of strawberries, you will have eaten 2 servings and 30 g of carbohydrates (2 servings x 15 g = 30 g).  For foods that have more than one food mixed, such as soups and casseroles, you must count the carbohydrates in each food that is included. The following list contains standard serving sizes of common carbohydrate-rich foods. Each of these servings has about 15 g of carbohydrates:   hamburger bun or  English muffin.   oz (15 mL) syrup.   oz (14 g) jelly.  1 slice of bread.  1 six-inch tortilla.  3 oz (85 g) cooked rice or pasta.  4 oz (113 g) cooked dried beans.  4 oz (113 g) starchy vegetable, such as peas, corn, or potatoes.  4 oz (113 g) hot cereal.  4 oz (113 g) mashed potatoes or  of a large baked potato.  4 oz (113 g) canned or frozen fruit.  4 oz (120 mL) fruit juice.  4-6 crackers.  6 chicken nuggets.  6 oz (170 g) unsweetened dry cereal.  6 oz (170 g) plain fat-free yogurt or yogurt sweetened with artificial sweeteners.  8 oz (240 mL) milk.  8 oz (170 g) fresh fruit or one small piece of fruit.  24 oz (680 g) popped popcorn. Example of carbohydrate counting Sample meal  3 oz (85 g) chicken breast.  6 oz (170 g) brown  rice.  4 oz (113 g) corn.  8 oz (240 mL) milk.  8 oz (170 g) strawberries with sugar-free whipped topping. Carbohydrate calculation 1. Identify the foods that contain carbohydrates: ? Rice. ? Corn. ? Milk. ? Strawberries. 2. Calculate how many servings you have of each food: ? 2 servings rice. ? 1 serving corn. ? 1 serving milk. ? 1 serving strawberries. 3. Multiply each number of servings by 15 g: ? 2 servings rice x 15 g = 30 g. ? 1 serving corn x 15 g = 15 g. ? 1 serving milk x 15 g = 15 g. ? 1 serving strawberries x 15 g = 15 g. 4. Add together all of the amounts to find the total grams of carbohydrates eaten: ? 30 g + 15 g + 15 g + 15 g = 75 g of carbohydrates total. Summary  Carbohydrate counting is a method of keeping track of how many carbohydrates you eat.  Eating carbohydrates naturally increases the amount of sugar (glucose) in the blood.  Counting how many carbohydrates you eat helps keep your blood glucose within normal limits, which helps you manage your diabetes.  A diet and nutrition specialist (registered dietitian) can help you make a meal plan and calculate how many carbohydrates you should have at each meal and snack. This information is not intended to replace advice given to you by your health care provider. Make sure you discuss any questions you have with your health care provider. Document Released: 05/13/2005 Document Revised: 11/20/2016   Document Reviewed: 10/25/2015 Elsevier Interactive Patient Education  [employer].

## 2018-07-14 NOTE — Progress Notes (Signed)
Penny Thomas [birth year] [id number]    History:    Presents for annual exam.  2013 endometrial ablation for severe  dysmenorrhea/endometriosis.  No bleeding but cramping week of cycle, started on oral Alesse 2019 and has had good relief of endometriosis pain.  BTL.  Normal Pap history.  GDM with last pregnancy.   Roxicet for dysmenorrhea.  Sickle cell trait.  Past medical history, past surgical history, family history and social history were all reviewed and documented in the EPIC chart.  CNA at [employer].  Mother diabetes.  4 sons ages 17, twins 48 and 28-year-old.  ROS:  A ROS was performed and pertinent positives and negatives are included.  Exam:  Vitals:   07/14/18 1541  BP: 122/80  Weight: 173 lb (78.5 kg)  Height: 5\' 7"  (1.702 m)   Body mass index is 27.1 kg/m.   General appearance:  Normal Thyroid:  Symmetrical, normal in size, without palpable masses or nodularity. Respiratory  Auscultation:  Clear without wheezing or rhonchi Cardiovascular  Auscultation:  Regular rate, without rubs, murmurs or gallops  Edema/varicosities:  Not grossly evident Abdominal  Soft,nontender, without masses, guarding or rebound.  Liver/spleen:  No organomegaly noted  Hernia:  None appreciated  Skin  Inspection:  Grossly normal   Breasts: Examined lying and sitting.     Right: Without masses, retractions, discharge or axillary adenopathy.     Left: Without masses, retractions, discharge or axillary adenopathy. Gentitourinary   Inguinal/mons:  Normal without inguinal adenopathy  External genitalia:  Normal  BUS/Urethra/Skene's glands:  Normal  Vagina:  Normal  Cervix:  Normal  Uterus:  normal in size, shape and contour.  Midline and mobile  Adnexa/parametria:     Rt: Without masses or tenderness.   Lt: Without masses or tenderness.  Anus and perineum: Normal  Digital rectal exam: Normal sphincter tone without palpated masses or tenderness  Assessment/Plan:  33 y.o. MBF G3,  P4 for annual exam.     2013 endometrial ablation for severe dysmenorrhea/endometriosis- amenorrhea, monthly cramping at time of cycle cycle/BTL Asthma, migraines-primary care manages  meds  Plan: [name] 150 mg tablets daily, prescription, proper use given and reviewed.  SBEs, exercise, calcium rich foods, MVI daily encouraged.  Refill of Roxicet to take every 6 hours as needed #30 no refills aware of addictive properties and to use sparingly.  CBC, hemoglobin A1c.  Normal Pap 2018, new screening guidelines reviewed.   [place], 3:48 PM 07/14/2018

## 2018-07-15 LAB — CBC WITH DIFFERENTIAL/PLATELET
Absolute Monocytes: 347 cells/uL (ref 200–950)
BASOS ABS: 10 {cells}/uL (ref 0–200)
Basophils Relative: 0.2 %
Eosinophils Absolute: 51 cells/uL (ref 15–500)
Eosinophils Relative: 1 %
HCT: 38.1 % (ref 35.0–45.0)
HEMOGLOBIN: 13.2 g/dL (ref 11.7–15.5)
Lymphs Abs: 1851 cells/uL (ref 850–3900)
MCH: 28.9 pg (ref 27.0–33.0)
MCHC: 34.6 g/dL (ref 32.0–36.0)
MCV: 83.4 fL (ref 80.0–100.0)
MONOS PCT: 6.8 %
MPV: 11.9 fL (ref 7.5–12.5)
NEUTROS PCT: 55.7 %
Neutro Abs: 2841 cells/uL (ref 1500–7800)
Platelets: 247 10*3/uL (ref 140–400)
RBC: 4.57 10*6/uL (ref 3.80–5.10)
RDW: 14.2 % (ref 11.0–15.0)
TOTAL LYMPHOCYTE: 36.3 %
WBC: 5.1 10*3/uL (ref 3.8–10.8)

## 2018-07-15 LAB — HEMOGLOBIN A1C
HEMOGLOBIN A1C: 5.6 %{Hb} (ref <5.7)
MEAN PLASMA GLUCOSE: 114 (calc)
eAG (mmol/L): 6.3 (calc)

## 2018-07-16 ENCOUNTER — Telehealth: Payer: Self-pay | Admitting: *Deleted

## 2018-07-16 MED ORDER — ELAGOLIX SODIUM 150 MG PO TABS: 150.0000 mg | ORAL_TABLET | Freq: Every morning | ORAL | 4 refills | Status: DC

## 2018-07-16 NOTE — Telephone Encounter (Signed)
Patient will need to use [facility] for Orlissa 150 mg tablet, Rx was prescribed on 07/14/18 at annual exam.  Rx sent

## 2018-07-22 MED FILL — PROPRANOLOL ER 80 MG CAP: 80 | 30 days supply | Qty: 30 | Fill #4

## 2018-07-24 ENCOUNTER — Telehealth: Payer: Self-pay | Admitting: *Deleted

## 2018-07-24 NOTE — Telephone Encounter (Signed)
Prior authorization done via [employer] approved for Orlissa 150 mg tablet until 01/21/19.

## 2018-08-11 NOTE — Progress Notes (Signed)
NEUROLOGY FOLLOW UP OFFICE NOTE  Penny Thomas [id number]  HISTORY OF PRESENT ILLNESS: Penny Thomas is a 33 year old right-handed woman who follows up for migraines.  UPDATE: Intensity:  Mild to moderate Duration:  3-4 hours with naproxen (did not take sumatriptan [place]) Frequency:  3 days in last 30 days, including today) Frequency of abortive medication: Naproxen 500mg  Rescue protocol:  Naproxen 500mg  first line (at work) and will take sumatriptan [place] at home. Current NSAIDS:  Naproxen 500mg  Current analgesics:  oxycodone  Current triptans:  Sumatriptan R[place]y  Current ergotamine:  none Current anti-emetic:  none Current muscle relaxants:  baclofen Current anti-anxiolytic:  none Current sleep aide:  none Current Antihypertensive medications:  Propranolol ER 80mg  Current Antidepressant medications:  none Current Anticonvulsant medications:  none Current anti-CGRP:  none Current Vitamins/Herbal/Supplements:  none Current Antihistamines/Decongestants:  none Other therapy:  none Other medication:  none  Caffeine:  no Alcohol:  seldom Smoker:  no Diet:  hydrates Exercise:  no Depression:  no; Anxiety:  no Other pain:  no Sleep hygiene:  okay  HISTORY:  Onset: Age 33 Location: Varies (bi-frontal, back of head) Quality: Squeezing/"feels like I am hit in the head with a hammer" Initial Intensity: 9-10/10 Aura: blurred vision Prodrome: no Associated symptoms: Nausea, dizziness, photophobia, osmophobia Initial Duration: 1 to 3 days initiail Frequency: 3 to 4 days per week (16 headache days per week) Triggers: Menstrual cycle, caffeine, scents (perfume, deoderant, hand sanitizer), change in weather Relieving factors: Laying down Activity: Lays down if severe  Past NSAIDS: Mobic, ibuprofen 800mg , Cambia Past analgesics: Hydrocodone-acetaminophen, tramadol 50mg , Roxicet, Excedrin Migraine Past abortive triptans: sumatriptan 100mg , Maxalt 10mg ,  Relpax 20mg  Past ergot:  none Past muscle relaxants: Flexeril Past anti-nausea: Zofran 4mg , promethazine 25mg  Past antihypertensive medications: no Past antidepressant medications: amitriptyline 50mg  (weight gain) Past anticonvulsant medications: topiramate 150mg  (effective but caused hair loss) Past vitamins/Herbal/Supplements: no Past antihistamines/decongestants: no Other past medications: no  Family history of headache: Mom, sister, grandmother  PAST MEDICAL HISTORY: Past Medical History:  Diagnosis Date  . Allergy   . Anemia    takes iron supplement  . Asthma   . Asthma    prn inhaler  . Blood transfusion 2007  . Blood transfusion 2010  . Blood transfusion without reported diagnosis   . 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 Hemorrhage in uterus 01-20-09  . Migraines   . 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 Seasonal allergies   . Sickle cell anemia (HCC)    trait  . Sickle cell trait ([place])     MEDICATIONS: Current Outpatient Medications on File Prior to Visit  Medication Sig Dispense Refill  . albuterol (PROVENTIL HFA;VENTOLIN HFA) 108 (90 Base) MCG/ACT inhaler Inhale 1-2 puffs into the lungs every 6 (six) hours as needed for wheezing or shortness of breath. 1 Inhaler 1  . azelastine (ASTELIN) 0.1 % nasal spray Place 2 sprays into both nostrils 2 (two) times daily. 30 mL 5  . baclofen (LIORESAL) 10 MG tablet Take 1 tablet (10 mg total) by mouth 3 (three) times daily. 30 each 0  . Biotin 1000 MCG tablet Take 2,000 mcg by mouth 3 (three) times daily.    . budesonide-formoterol (SYMBICORT) 160-4.5 MCG/ACT inhaler Inhale 2 puffs into the lungs 2 (two) times daily. 1 Inhaler 5  . cetirizine (ZYRTEC) 10 MG tablet Take 1 tablet (10 mg total) by mouth daily. 30 tablet 5  . Elagolix Sodium ([name]) 150 MG TABS Take 150 mg  by mouth every morning. 90 tablet 4  . fluconazole (DIFLUCAN) 150 MG tablet Take one today and repeat in 3 days if needed 2 tablet 0  .  naproxen (NAPROSYN) 500 MG tablet Take 1 tablet (500 mg total) by mouth 2 (two) times daily with a meal. 30 tablet 0  . olopatadine (PATANOL) 0.1 % ophthalmic solution Place 1 drop into both eyes 2 (two) times daily as needed for allergies. 5 mL 5  . oxyCODONE-acetaminophen (ROXICET) 5-325 MG tablet Take 1 tablet by mouth every 6 (six) hours as needed for severe pain. 30 tablet 0  . propranolol ER (INDERAL LA) 80 MG 24 hr capsule Take 1 capsule (80 mg total) by mouth daily. 30 capsule 5  . SUMAtriptan 6 MG/0.5ML SOAJ Inject 6 mg into the skin as needed. 6 Syringe 3   No current facility-administered medications on file prior to visit.     ALLERGIES: No Known Allergies  FAMILY HISTORY: Family History  Problem Relation Age of Onset  . Fibromyalgia Mother   . Multiple sclerosis Mother   . Asthma Father   . Healthy Maternal Grandmother   . Healthy Maternal Grandfather   . Healthy Paternal Grandmother   . Healthy Paternal Grandfather   . Endometriosis Sister    SOCIAL HISTORY: Social History   Socioeconomic History  . Marital status: Married    Spouse name: Not on file  . Number of children: 4  . Years of education: 71  . Highest education level: Not on file  Occupational History  . Occupation: NT/Transporter  Social Needs  . Financial resource strain: Not on file  . Food insecurity:    Worry: Not on file    Inability: Not on file  . Transportation needs:    Medical: Not on file    Non-medical: Not on file  Tobacco Use  . Smoking status: Never Smoker  . Smokeless tobacco: Never Used  Substance and Sexual Activity  . Alcohol use: No    Alcohol/week: 0.0 standard drinks    Comment: seldom  . Drug use: No  . Sexual activity: Yes    Birth control/protection: Surgical, None    Comment: TUBAL LIGATION, INTERCOURSE AGE 41, SEXUAL PARTNERS 5  Lifestyle  . Physical activity:    Days per week: Not on file    Minutes per session: Not on file  . Stress: Not on file   Relationships  . Social connections:    Talks on phone: Not on file    Gets together: Not on file    Attends religious service: Not on file    Active member of club or organization: Not on file    Attends meetings of clubs or organizations: Not on file    Relationship status: Not on file  . Intimate partner violence:    Fear of current or ex partner: Not on file    Emotionally abused: Not on file    Physically abused: Not on file    Forced sexual activity: Not on file  Other Topics Concern  . Not on file  Social History Narrative   Fun: Baking, playing with the children.   Denies abuse and feels safe at home.         REVIEW OF SYSTEMS: Constitutional: No fevers, chills, or sweats, no generalized fatigue, change in appetite Eyes: No visual changes, double vision, eye pain Ear, nose and throat: No hearing loss, ear pain, nasal congestion, sore throat Cardiovascular: No chest pain, palpitations Respiratory:  No shortness  of breath at rest or with exertion, wheezes GastrointestinaI: No nausea, vomiting, diarrhea, abdominal pain, fecal incontinence Genitourinary:  No dysuria, urinary retention or frequency Musculoskeletal:  No neck pain, back pain Integumentary: No rash, pruritus, skin lesions Neurological: as above Psychiatric: No depression, insomnia, anxiety Endocrine: No palpitations, fatigue, diaphoresis, mood swings, change in appetite, change in weight, increased thirst Hematologic/Lymphatic:  No purpura, petechiae. Allergic/Immunologic: no itchy/runny eyes, nasal congestion, recent allergic reactions, rashes  PHYSICAL EXAM: Blood pressure 108/76, pulse 82, temperature 98.2 F (36.8 C), height 5\' 7"  (1.702 m), weight 174 lb (78.9 kg), SpO2 99 %. General: No acute distress.  Patient appears well-groomed.   Head:  Normocephalic/atraumatic Eyes:  Fundi examined but not visualized Neck: supple, no paraspinal tenderness, full range of motion Heart:  Regular rate and rhythm  Lungs:  Clear to auscultation bilaterally Back: No paraspinal tenderness Neurological Exam: alert and oriented to person, place, and time. Attention span and concentration intact, recent and remote memory intact, fund of knowledge intact.  Speech fluent and not dysarthric, language intact.  CN II-XII intact. Bulk and tone normal, muscle strength 5/5 throughout.  Sensation to light touch intact.  Deep tendon reflexes 2+ throughout, toes downgoing.  Finger to nose and heel to shin testing intact.  Gait normal, Romberg negative.  IMPRESSION: Migraine without aura, without status migrainosus, not intractable Migraine with aura, without status migrainosus, not intractable  PLAN: 1.  For preventative management, continue propranolol ER 80mg  daily 2.  For abortive therapy, she will try Reyvow.  Hopefully, it won't cause the same drowsiness as sumatriptan so she can take at work.  She can still use sumatriptan at home if she feels that is more effective. 3.  Limit use of pain relievers to no more than 2 days out of week to prevent risk of rebound or medication-overuse headache. 4.  Keep headache diary 5.  Exercise, hydration, caffeine cessation, sleep hygiene, monitor for and avoid triggers 6.  Consider:  magnesium citrate 400mg  daily, riboflavin 400mg  daily, and coenzyme Q10 100mg  three times daily 7.  Follow up in 6 months.  Metta Clines, DO

## 2018-08-13 ENCOUNTER — Encounter: Payer: Self-pay | Admitting: Neurology

## 2018-08-13 ENCOUNTER — Ambulatory Visit (INDEPENDENT_AMBULATORY_CARE_PROVIDER_SITE_OTHER): Payer: 59 | Admitting: Neurology

## 2018-08-13 ENCOUNTER — Other Ambulatory Visit: Payer: Self-pay

## 2018-08-13 VITALS — BP 108/76 | HR 82 | Temp 98.2°F | Ht 67.0 in | Wt 174.0 lb

## 2018-08-13 DIAGNOSIS — G43009 Migraine without aura, not intractable, without status migrainosus: Secondary | ICD-10-CM

## 2018-08-13 DIAGNOSIS — G43109 Migraine with aura, not intractable, without status migrainosus: Secondary | ICD-10-CM | POA: Diagnosis not present

## 2018-08-13 MED ORDER — LASMIDITAN SUCCINATE 100 MG PO TABS: 1.0000 | ORAL_TABLET | Freq: Every day | ORAL | 3 refills | Status: DC | PRN

## 2018-08-13 MED ORDER — LASMIDITAN SUCCINATE 100 MG PO TABS: 1.0000 | ORAL_TABLET | Freq: Every day | ORAL | 0 refills | Status: DC | PRN

## 2018-08-13 NOTE — Patient Instructions (Signed)
1.  For preventative management, continue propranolol ER 80mg  daily 2.  For abortive therapy, try Reyvow.  Take 1 tablet.  No more than 1 tablet in 24 hours.   3.  Limit use of pain relievers to no more than 2 days out of week to prevent risk of rebound or medication-overuse headache. 4.  Keep headache diary 5.  Exercise, hydration, caffeine cessation, sleep hygiene, monitor for and avoid triggers 6.  Consider:  magnesium citrate 400mg  daily, riboflavin 400mg  daily, and coenzyme Q10 100mg  three times daily 7.  Follow up in 6 months.

## 2018-08-26 ENCOUNTER — Telehealth: Payer: Self-pay | Admitting: Neurology

## 2018-08-26 MED FILL — PROPRANOLOL HCL ER 80 MG CP: 80 | 30 days supply | Qty: 30 | Fill #5

## 2018-08-26 NOTE — Telephone Encounter (Signed)
Called and discussed with Pt the co-pay card I gave her with the samples. She said she was told it was not covering the medication when run with her insurance.. I advised her to call the pharmacy back and explain to them it was no to be used with her insurance and she was to receive the medication for free. Pt will call me back tomorrow if still having trouble and we will send to another pharmacy since [facility] is extremely busy right now.

## 2018-08-26 NOTE — Telephone Encounter (Signed)
Patient state that we gave her samples of the Reyvow and her ins will not pay for it so she would like [place] to call in something different in to the [facility] out patient pharmacy

## 2018-08-27 ENCOUNTER — Other Ambulatory Visit: Payer: Self-pay | Admitting: Neurology

## 2018-08-27 ENCOUNTER — Telehealth: Payer: 59 | Admitting: Family

## 2018-08-27 DIAGNOSIS — B9789 Other viral agents as the cause of diseases classified elsewhere: Secondary | ICD-10-CM

## 2018-08-27 DIAGNOSIS — J019 Acute sinusitis, unspecified: Principal | ICD-10-CM

## 2018-08-27 MED ORDER — LASMIDITAN SUCCINATE 100 MG PO TABS: 1.0000 | ORAL_TABLET | Freq: Every day | ORAL | 3 refills | Status: DC | PRN

## 2018-08-27 MED ORDER — FLUTICASONE PROPIONATE 50 MCG/ACT NA SUSP: 2.0000 | Freq: Every day | NASAL | 0 refills | Status: DC

## 2018-08-27 MED FILL — REYVOW 100 MG TABS: 100 | 30 days supply | Qty: 8 | Fill #0

## 2018-08-27 NOTE — Progress Notes (Signed)

## 2018-08-29 DIAGNOSIS — J0110 Acute frontal sinusitis, unspecified: Secondary | ICD-10-CM | POA: Diagnosis not present

## 2018-08-31 ENCOUNTER — Telehealth: Payer: 59 | Admitting: Family

## 2018-08-31 DIAGNOSIS — B9689 Other specified bacterial agents as the cause of diseases classified elsewhere: Secondary | ICD-10-CM

## 2018-08-31 DIAGNOSIS — J019 Acute sinusitis, unspecified: Secondary | ICD-10-CM | POA: Diagnosis not present

## 2018-08-31 MED ORDER — AMOXICILLIN-POT CLAVULANATE 875-125 MG PO TABS: 1.0000 | ORAL_TABLET | Freq: Two times a day (BID) | ORAL | 0 refills | Status: DC

## 2018-08-31 MED ORDER — FLUCONAZOLE 150 MG PO TABS: 150.0000 mg | ORAL_TABLET | Freq: Once | ORAL | 0 refills | Status: AC

## 2018-08-31 MED FILL — AMOX-CLAV 875-125 MG TABLET: 875-125 | 7 days supply | Qty: 14 | Fill #0

## 2018-08-31 MED FILL — FLUCONAZOLE 150 MG TABS: 150 | 1 days supply | Qty: 1 | Fill #0

## 2018-08-31 NOTE — Progress Notes (Signed)

## 2018-08-31 NOTE — Addendum Note (Signed)
Addended by: Dutch Quint B on: 08/31/2018 08:46 AM   Modules accepted: Orders

## 2018-09-23 ENCOUNTER — Other Ambulatory Visit: Payer: Self-pay | Admitting: Neurology

## 2018-09-23 MED FILL — PROPRANOLOL HCL ER 80 MG CP: 80 | 30 days supply | Qty: 30 | Fill #0

## 2018-10-01 ENCOUNTER — Telehealth: Payer: 59 | Admitting: Physician Assistant

## 2018-10-01 ENCOUNTER — Telehealth: Payer: 59 | Admitting: Family

## 2018-10-01 DIAGNOSIS — L739 Follicular disorder, unspecified: Secondary | ICD-10-CM

## 2018-10-01 DIAGNOSIS — B029 Zoster without complications: Secondary | ICD-10-CM

## 2018-10-01 MED ORDER — CLINDAMYCIN PHOSPHATE 1 % EX GEL: Freq: Two times a day (BID) | CUTANEOUS | 0 refills | Status: DC

## 2018-10-01 MED ORDER — VALACYCLOVIR HCL 1 G PO TABS: 1000.0000 mg | ORAL_TABLET | Freq: Three times a day (TID) | ORAL | 0 refills | Status: DC

## 2018-10-01 MED FILL — OXYCODONE-ACETAMINOPHEN 5-3: 5-325 | 8 days supply | Qty: 30 | Fill #0

## 2018-10-01 MED FILL — valACYclovir HCL 1 GM TABS: 1 | 7 days supply | Qty: 21 | Fill #0

## 2018-10-01 NOTE — Progress Notes (Signed)
I have spent 5 minutes in review of e-visit questionnaire, review and updating patient chart, medical decision making and response to patient.   Keaden Gunnoe Cody Azhar Knope, PA-C    

## 2018-10-01 NOTE — Progress Notes (Signed)
E-visit for Shingles   We are sorry that you are not feeling well. Here is how we plan to help!  Based on what you shared with me it looks like you have shingles.  Shingles or herpes zoster, is a common infection of the nerves.  It is a painful rash caused by the herpes zoster virus.  This is the same virus that causes chickenpox.  After a person has chickenpox, the virus remains inactive in the nerve cells.  Years later, the virus can become active again and travel to the skin.  It typically will appear on one side of the face or body.  Burning or shooting pain, tingling, or itching are early signs of the infection.  Blisters typically scab over in 7 to 10 days and clear up within 2-4 weeks. Shingles is only contagious to people that have never had the chickenpox, the chickenpox vaccine, or anyone who has a compromised immune system.  You should avoid contact with these type of people until your blisters scab over.  I have prescribed Valacyclovir 1g three times daily for 7 days   HOME CARE: Apply ice packs (wrapped in a thin towel), cool compresses, or soak in cool bath to help reduce pain. Use calamine lotion to calm itchy skin. Avoid scratching the rash. Avoid direct sunlight.  GET HELP RIGHT AWAY IF: Symptoms that don't away after treatment. A rash or blisters near your eye. Increased drainage, fever, or rash after treatment. Severe pain that doesn't go away.   MAKE SURE YOU   Understand these instructions. Will watch your condition. Will get help right away if you are not doing well or get worse.  Thank you for choosing an e-visit.  Your e-visit answers were reviewed by a board certified advanced clinical practitioner to complete your personal care plan. Depending upon the condition, your plan could have included both over the counter or prescription medications.  Please review your pharmacy choice. Make sure the pharmacy is open so you can pick up prescription now. If there is  a problem, you may contact your provider through MyChart messaging and have the prescription routed to another pharmacy.  Your safety is important to us. If you have drug allergies check your prescription carefully.   For the next 24 hours you can use MyChart to ask questions about today's visit, request a non-urgent call back, or ask for a work or school excuse. You will get an email in the next two days asking about your experience. I hope that your e-visit has been valuable and will speed your recovery.  

## 2018-10-01 NOTE — Progress Notes (Signed)
Greater than 5 minutes, yet less than 10 minutes of time have been spent researching, coordinating, and implementing care for this patient today.  Thank you for the details you included in the comment boxes. Those details are very helpful in determining the best course of treatment for you and help [place] to provide the best care.  I see the photo with the spot on your face. See plan below. As far as the part under your ear, this may be unrelated (typically). Please write [place] back and share more about the area under your ear. Cannot see in photo.   E Visit for Rash  We are sorry that you are not feeling well. Here is how we plan to help!   Based upon what you have shared with me it looks like you have a bacterial follicultits.  Folliculitis is inflammation of the hair follicles that can be caused by a superficial infection of the skin and is treated with an antibiotic. I have prescribed: and Topical clindamycin      HOME CARE:   Take cool showers and avoid direct sunlight.  Apply cool compress or wet dressings.  Take a bath in an oatmeal bath.  Sprinkle content of one Aveeno packet under running faucet with comfortably warm water.  Bathe for 15-20 minutes, 1-2 times daily.  Pat dry with a towel. Do not rub the rash.  Use hydrocortisone cream.  Take an antihistamine like Benadryl for widespread rashes that itch.  The adult dose of Benadryl is 25-50 mg by mouth 4 times daily.  Caution:  This type of medication may cause sleepiness.  Do not drink alcohol, drive, or operate dangerous machinery while taking antihistamines.  Do not take these medications if you have prostate enlargement.  Read package instructions thoroughly on all medications that you take.  GET HELP RIGHT AWAY IF:   Symptoms don't go away after treatment.  Severe itching that persists.  If you rash spreads or swells.  If you rash begins to smell.  If it blisters and opens or develops a yellow-brown crust.  You  develop a fever.  You have a sore throat.  You become short of breath.  MAKE SURE YOU:  Understand these instructions. Will watch your condition. Will get help right away if you are not doing well or get worse.  Thank you for choosing an e-visit. Your e-visit answers were reviewed by a board certified advanced clinical practitioner to complete your personal care plan. Depending upon the condition, your plan could have included both over the counter or prescription medications. Please review your pharmacy choice. Be sure that the pharmacy you have chosen is open so that you can pick up your prescription now.  If there is a problem you may message your provider in [place] to have the prescription routed to another pharmacy. Your safety is important to [place]. If you have drug allergies check your prescription carefully.  For the next 24 hours, you can use MyChart to ask questions about today's visit, request a non-urgent call back, or ask for a work or school excuse from your e-visit provider. You will get an email in the next two days asking about your experience. I hope that your e-visit has been valuable and will speed your recovery.

## 2018-10-05 ENCOUNTER — Telehealth: Payer: 59

## 2018-10-05 ENCOUNTER — Ambulatory Visit
Admission: RE | Admit: 2018-10-05 | Discharge: 2018-10-05 | Disposition: A | Discharge status: Home / Self Care | Payer: 59 | Source: Ambulatory Visit

## 2018-10-05 ENCOUNTER — Telehealth: Payer: 59 | Admitting: Physician Assistant

## 2018-10-05 ENCOUNTER — Encounter: Payer: Self-pay | Admitting: Physician Assistant

## 2018-10-05 DIAGNOSIS — H9202 Otalgia, left ear: Secondary | ICD-10-CM

## 2018-10-05 DIAGNOSIS — B028 Zoster with other complications: Secondary | ICD-10-CM

## 2018-10-05 MED ORDER — GABAPENTIN 300 MG PO CAPS: 300.0000 mg | ORAL_CAPSULE | Freq: Every day | ORAL | 0 refills | Status: DC

## 2018-10-05 MED ORDER — CAPSAICIN 0.025 % EX CREA: TOPICAL_CREAM | Freq: Two times a day (BID) | CUTANEOUS | 0 refills | Status: DC

## 2018-10-05 MED FILL — CAPSAICIN 0.025% CREAM: 0.025 | 30 days supply | Qty: 60 | Fill #0

## 2018-10-05 MED FILL — GABAPENTIN 300 MG CAPSULE: 300 | 30 days supply | Qty: 30 | Fill #0

## 2018-10-05 NOTE — Discharge Instructions (Addendum)
°  Use the cream as prescribed. Try this first. Take the gabapentin at bedtime for the first couple of days and then may increase if needed to twice a day. This medication may make you drowsy.   Follow up as needed for continued or worsening symptoms

## 2018-10-05 NOTE — ED Provider Notes (Addendum)
Virtual Visit via Video Note:  Penny Thomas  initiated request for Telemedicine visit with Penny Thomas [facility] team. I connected with Penny Thomas  on 10/05/2018 at 9:42 AM  for a synchronized telemedicine visit using a video enabled HIPPA compliant telemedicine application. I verified that I am speaking with Penny Thomas  using two identifiers. Penny July, NP  was physically located in a Penny Thomas and Penny Thomas was located at a different location.   The limitations of evaluation and management by telemedicine as well as the availability of in-person appointments were discussed. Patient was informed that she  may incur a bill ( including co-pay) for this virtual visit encounter. Penny Thomas  expressed understanding and gave verbal consent to proceed with virtual visit.     History of Present Illness:Penny Thomas  is a 33 y.o. female presents with continued  pain after being diagnosed with shingles. She is still taking the valtrex and the rash is still present. The rash is on ans round her left ear. She is having burning, sharp stabbing pain and unable to sleep at night. The pain is pretty constant. She has been using lidocaine without relief. No fever.   Past Medical History:  Diagnosis Date  . Allergy   . Anemia    takes iron supplement  . Asthma   . Asthma    prn inhaler  . Blood transfusion 2007  . Blood transfusion 2010  . Blood transfusion without reported diagnosis   . 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 Hemorrhage in uterus 01-20-09  . Migraines   . 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 Seasonal allergies   . Sickle cell anemia (HCC)    trait  . Sickle cell trait ([place])     No Known Allergies      Observations/Objective:   GENERAL APPEARANCE: Well developed, well nourished, alert and cooperative. Appears in pain with facial grimacing.  HEAD: normocephalic. Non labored  breathing, no dyspnea or distress Skin: Skin normal color  PSYCHIATRIC: The mental examination revealed the patient was oriented to person, place, and time. The patient was able to demonstrate good judgement and reason, without hallucinations, abnormal affect or abnormal behaviors during the examination. Patient is not suicidal    Assessment and Plan: Pt  with post herpetic neuralgia Treating with capsaicin cream and gabapentin at bedtime.  Medication precautions given.     Follow Up Instructions: Follow up as needed for continued or worsening symptoms    I discussed the assessment and treatment plan with the patient. The patient was provided an opportunity to ask questions and all were answered. The patient agreed with the plan and demonstrated an understanding of the instructions.   The patient was advised to call back or seek an in-person evaluation if the symptoms worsen or if the condition fails to improve as anticipated.    Penny July, NP  10/05/2018 9:42 AM     Penny July, NP 10/05/18 [id number]    Penny July, NP 10/05/18 1200

## 2018-10-05 NOTE — Progress Notes (Signed)
Based on what you shared with me, I feel your condition warrants further evaluation and I recommend that you be seen for a face to face office visit.  [name],  It is recommended that you have a face to face evaluation to rule out any secondary infections or complications of Shingles.    NOTE: If you entered your credit card information for this eVisit, you will not be charged. You may see a "hold" on your card for the $35 but that hold will drop off and you will not have a charge processed.  If you are having a true medical emergency please call 911.  If you need an urgent face to face visit, [place] has four urgent care centers for your convenience.    PLEASE NOTE: THE [facility] LOCATIONS AND URGENT CARE CLINICS DO NOT HAVE THE TESTING FOR CORONAVIRUS COVID19 AVAILABLE.  IF YOU FEEL YOU NEED THIS TEST YOU MUST GO TO A TRIAGE LOCATION AT [place]   [email or web address] to reserve your spot online an avoid wait times  [facility] [address] [place], [place] [address] Modified hours of operation: Monday-Friday, 12 PM to 6 PM  Saturday & Sunday 10 AM to 4 PM *Across the street from [place] (New Address!) [address], [place], [place] [address] *Just off [address], across the road from [place] hours of operation: Monday-Friday, 12 PM to 6 PM  Closed Saturday & Sunday  [facility]'s modified hours of operation will be in effect from May 1 until May 31   The following sites will take your insurance:  . [facility] [place] a Provider at this Location  [address] [place], [place] [address] . 10 am to 8 pm Monday-Friday . 12 pm to 8 pm Saturday-Sunday   . [facility] at [place] a Provider at this Location  [address] [place], [place], [place] [address] . 8 am to 8 pm Monday-Friday . 9 am to 6 pm Saturday . 11 am to 6 pm Sunday   . [facility] at [place] Get Driving Directions  [address] [place], [place] [address] . 8 am to 8 pm Monday-Friday . 8 am to 4 pm Saturday-Sunday   Your e-visit answers were reviewed by a board certified advanced clinical practitioner to complete your personal care plan.  Thank you for using e-Visits. I have spent 7 min in completion and review of this note- Lacy Duverney [facility]

## 2018-10-15 DIAGNOSIS — B028 Zoster with other complications: Secondary | ICD-10-CM | POA: Diagnosis not present

## 2018-10-15 MED FILL — valACYclovir HCL 1 GM TABS: 1 | 7 days supply | Qty: 21 | Fill #0

## 2018-10-20 MED FILL — PROPRANOLOL HCL ER 80 MG CP: 80 | 30 days supply | Qty: 30 | Fill #1

## 2018-10-26 ENCOUNTER — Other Ambulatory Visit: Payer: Self-pay | Admitting: Allergy

## 2018-10-26 DIAGNOSIS — J309 Allergic rhinitis, unspecified: Secondary | ICD-10-CM

## 2018-10-26 DIAGNOSIS — H1010 Acute atopic conjunctivitis, unspecified eye: Secondary | ICD-10-CM

## 2018-10-28 ENCOUNTER — Telehealth: Payer: Self-pay | Admitting: *Deleted

## 2018-10-28 MED ORDER — ELAGOLIX SODIUM 150 MG PO TABS: 150.0000 mg | ORAL_TABLET | Freq: Every morning | ORAL | 0 refills | Status: DC

## 2018-10-28 NOTE — Telephone Encounter (Signed)
Ori for me called stating patient needs refill on Orlissa 150 mg #90, cannot send via epic. Rx printed and faxed to [phone].

## 2018-11-24 ENCOUNTER — Other Ambulatory Visit: Payer: Self-pay | Admitting: Women's Health

## 2018-11-24 ENCOUNTER — Encounter: Payer: Self-pay | Admitting: Women's Health

## 2018-11-24 MED ORDER — FLUCONAZOLE 150 MG PO TABS: 150.0000 mg | ORAL_TABLET | Freq: Once | ORAL | 0 refills | Status: AC

## 2018-11-24 MED FILL — PROPRANOLOL HCL ER 80 MG CP: 80 | 30 days supply | Qty: 30 | Fill #2

## 2018-11-24 MED FILL — FLUCONAZOLE 150 MG TABS: 150 | 1 days supply | Qty: 1 | Fill #0

## 2018-12-02 ENCOUNTER — Other Ambulatory Visit: Payer: Self-pay

## 2018-12-02 ENCOUNTER — Ambulatory Visit: Payer: 59 | Admitting: Family Medicine

## 2018-12-02 ENCOUNTER — Encounter: Payer: Self-pay | Admitting: Family Medicine

## 2018-12-02 VITALS — BP 110/70 | HR 78 | Temp 99.0°F | Ht 68.0 in | Wt 178.2 lb

## 2018-12-02 DIAGNOSIS — Z8632 Personal history of gestational diabetes: Secondary | ICD-10-CM

## 2018-12-02 DIAGNOSIS — R7303 Prediabetes: Secondary | ICD-10-CM

## 2018-12-02 DIAGNOSIS — M5442 Lumbago with sciatica, left side: Secondary | ICD-10-CM

## 2018-12-02 DIAGNOSIS — J4530 Mild persistent asthma, uncomplicated: Secondary | ICD-10-CM | POA: Diagnosis not present

## 2018-12-02 HISTORY — DX: Personal history of gestational diabetes: Z86.32

## 2018-12-02 LAB — POCT GLYCOSYLATED HEMOGLOBIN (HGB A1C): Hemoglobin A1C: 6.1 % — AB (ref 4.0–5.6)

## 2018-12-02 MED ORDER — MELOXICAM 15 MG PO TABS: 15.0000 mg | ORAL_TABLET | Freq: Every day | ORAL | 0 refills | Status: DC

## 2018-12-02 MED ORDER — METHOCARBAMOL 500 MG PO TABS: 500.0000 mg | ORAL_TABLET | Freq: Every evening | ORAL | 0 refills | Status: DC | PRN

## 2018-12-02 MED FILL — METHOCARBAMOL 500 MG TABS: 500 | 30 days supply | Qty: 30 | Fill #0

## 2018-12-02 MED FILL — MELOXICAM 15 MG TABLET: 15 | 30 days supply | Qty: 30 | Fill #0

## 2018-12-02 NOTE — Patient Instructions (Signed)
Stop all over the counter anti-inflammatories such as Aleve or naproxen and start taking the meloxicam once daily with food and a full glass of water. You may take the muscle relaxant if needed for severe pain.  Be aware that this medication is sedating and particularly sedating if you take it in conjunction with the migraine medication.  Continue using heat or ice.  You may also use a topical analgesic such as Biofreeze, salon pause or others.  Do the stretches as discussed.  Let me know if you are worsening or if your symptoms are not improving in the next 2 to 3 weeks.    Sciatica  Sciatica is pain, numbness, weakness, or tingling along the path of the sciatic nerve. The sciatic nerve starts in the lower back and runs down the back of each leg. The nerve controls the muscles in the lower leg and in the back of the knee. It also provides feeling (sensation) to the back of the thigh, the lower leg, and the sole of the foot. Sciatica is a symptom of another medical condition that pinches or puts pressure on the sciatic nerve. Sciatica most often only affects one side of the body. Sciatica usually goes away on its own or with treatment. In some cases, sciatica may come back (recur). What are the causes? This condition is caused by pressure on the sciatic nerve or pinching of the nerve. This may be the result of:  A disk in between the bones of the spine bulging out too far (herniated disk).  Age-related changes in the spinal disks.  A pain disorder that affects a muscle in the buttock.  Extra bone growth near the sciatic nerve.  A break (fracture) of the pelvis.  Pregnancy.  Tumor. This is rare. What increases the risk? The following factors may make you more likely to develop this condition:  Playing sports that place pressure or stress on the spine.  Having poor strength and flexibility.  A history of back injury or surgery.  Sitting for long periods of time.  Doing  activities that involve repetitive bending or lifting.  Obesity. What are the signs or symptoms? Symptoms can vary from mild to very severe, and they may include:  Any of these problems in the lower back, leg, hip, or buttock: ? Mild tingling, numbness, or dull aches. ? Burning sensations. ? Sharp pains.  Numbness in the back of the calf or the sole of the foot.  Leg weakness.  Severe back pain that makes movement difficult. Symptoms may get worse when you cough, sneeze, or laugh, or when you sit or stand for long periods of time. How is this diagnosed? This condition may be diagnosed based on:  Your symptoms and medical history.  A physical exam.  Blood tests.  Imaging tests, such as: ? X-rays. ? MRI. ? CT scan. How is this treated? In many cases, this condition improves on its own without treatment. However, treatment may include:  Reducing or modifying physical activity.  Exercising and stretching.  Icing and applying heat to the affected area.  Medicines that help to: ? Relieve pain and swelling. ? Relax your muscles.  Injections of medicines that help to relieve pain, irritation, and inflammation around the sciatic nerve (steroids).  Surgery. Follow these instructions at home: Medicines  Take over-the-counter and prescription medicines only as told by your health care provider.  Ask your health care provider if the medicine prescribed to you: ? Requires you to avoid driving or  using heavy machinery. ? Can cause constipation. You may need to take these actions to prevent or treat constipation:  Drink enough fluid to keep your urine pale yellow.  Take over-the-counter or prescription medicines.  Eat foods that are high in fiber, such as beans, whole grains, and fresh fruits and vegetables.  Limit foods that are high in fat and processed sugars, such as fried or sweet foods. Managing pain      If directed, put ice on the affected area. ? Put ice  in a plastic bag. ? Place a towel between your skin and the bag. ? Leave the ice on for 20 minutes, 2-3 times a day.  If directed, apply heat to the affected area. Use the heat source that your health care provider recommends, such as a moist heat pack or a heating pad. ? Place a towel between your skin and the heat source. ? Leave the heat on for 20-30 minutes. ? Remove the heat if your skin turns bright red. This is especially important if you are unable to feel pain, heat, or cold. You may have a greater risk of getting burned. Activity   Return to your normal activities as told by your health care provider. Ask your health care provider what activities are safe for you.  Avoid activities that make your symptoms worse.  Take brief periods of rest throughout the day. ? When you rest for longer periods, mix in some mild activity or stretching between periods of rest. This will help to prevent stiffness and pain. ? Avoid sitting for long periods of time without moving. Get up and move around at least one time each hour.  Exercise and stretch regularly, as told by your health care provider.  Do not lift anything that is heavier than 10 lb (4.5 kg) while you have symptoms of sciatica. When you do not have symptoms, you should still avoid heavy lifting, especially repetitive heavy lifting.  When you lift objects, always use proper lifting technique, which includes: ? Bending your knees. ? Keeping the load close to your body. ? Avoiding twisting. General instructions  Maintain a healthy weight. Excess weight puts extra stress on your back.  Wear supportive, comfortable shoes. Avoid wearing high heels.  Avoid sleeping on a mattress that is too soft or too hard. A mattress that is firm enough to support your back when you sleep may help to reduce your pain.  Keep all follow-up visits as told by your health care provider. This is important. Contact a health care provider if:  You have  pain that: ? Wakes you up when you are sleeping. ? Gets worse when you lie down. ? Is worse than you have experienced in the past. ? Lasts longer than 4 weeks.  You have an unexplained weight loss. Get help right away if:  You are not able to control when you urinate or have bowel movements (incontinence).  You have: ? Weakness in your lower back, pelvis, buttocks, or legs that gets worse. ? Redness or swelling of your back. ? A burning sensation when you urinate. Summary  Sciatica is pain, numbness, weakness, or tingling along the path of the sciatic nerve.  This condition is caused by pressure on the sciatic nerve or pinching of the nerve.  Sciatica can cause pain, numbness, or tingling in the lower back, legs, hips, and buttocks.  Treatment often includes rest, exercise, medicines, and applying ice or heat. This information is not intended to replace advice given  to you by your health care provider. Make sure you discuss any questions you have with your health care provider. Document Released: 05/07/2001 Document Revised: 06/01/2018 Document Reviewed: 06/01/2018 Elsevier Patient Education  [place] your health care provider which exercises are safe for you. Do exercises exactly as told by your health care provider and adjust them as directed. It is normal to feel mild stretching, pulling, tightness, or discomfort as you do these exercises. Stop right away if you feel sudden pain or your pain gets worse. Do not begin these exercises until told by your health care provider. Stretching and range-of-motion exercises These exercises warm up your muscles and joints and improve the movement and flexibility of your hips and back. These exercises also help to relieve pain, numbness, and tingling. Sciatic nerve glide 1. Sit in a chair with your head facing down toward your chest. Place your hands behind your back. Let your shoulders slump forward. 2. Slowly  straighten one of your legs while you tilt your head back as if you are looking toward the ceiling. Only straighten your leg as far as you can without making your symptoms worse. 3. Hold this position for __________ seconds. 4. Slowly return to the starting position. 5. Repeat with your other leg. Repeat __________ times. Complete this exercise __________ times a day. Knee to chest with hip adduction and internal rotation  1. Lie on your back on a firm surface with both legs straight. 2. Bend one of your knees and move it up toward your chest until you feel a gentle stretch in your lower back and buttock. Then, move your knee toward the shoulder that is on the opposite side from your leg. This is hip adduction and internal rotation. ? Hold your leg in this position by holding on to the front of your knee. 3. Hold this position for __________ seconds. 4. Slowly return to the starting position. 5. Repeat with your other leg. Repeat __________ times. Complete this exercise __________ times a day. Prone extension on elbows  1. Lie on your abdomen on a firm surface. A bed may be too soft for this exercise. 2. Prop yourself up on your elbows. 3. Use your arms to help lift your chest up until you feel a gentle stretch in your abdomen and your lower back. ? This will place some of your body weight on your elbows. If this is uncomfortable, try stacking pillows under your chest. ? Your hips should stay down, against the surface that you are lying on. Keep your hip and back muscles relaxed. 4. Hold this position for __________ seconds. 5. Slowly relax your upper body and return to the starting position. Repeat __________ times. Complete this exercise __________ times a day. Strengthening exercises These exercises build strength and endurance in your back. Endurance is the ability to use your muscles for a long time, even after they get tired. Pelvic tilt This exercise strengthens the muscles that lie  deep in the abdomen. 1. Lie on your back on a firm surface. Bend your knees and keep your feet flat on the floor. 2. Tense your abdominal muscles. Tip your pelvis up toward the ceiling and flatten your lower back into the floor. ? To help with this exercise, you may place a small towel under your lower back and try to push your back into the towel. 3. Hold this position for __________ seconds. 4. Let your muscles relax completely before you repeat this exercise.  Repeat __________ times. Complete this exercise __________ times a day. Alternating arm and leg raises  1. Get on your hands and knees on a firm surface. If you are on a hard floor, you may want to use padding, such as an exercise mat, to cushion your knees. 2. Line up your arms and legs. Your hands should be directly below your shoulders, and your knees should be directly below your hips. 3. Lift your left leg behind you. At the same time, raise your right arm and straighten it in front of you. ? Do not lift your leg higher than your hip. ? Do not lift your arm higher than your shoulder. ? Keep your abdominal and back muscles tight. ? Keep your hips facing the ground. ? Do not arch your back. ? Keep your balance carefully, and do not hold your breath. 4. Hold this position for __________ seconds. 5. Slowly return to the starting position. 6. Repeat with your right leg and your left arm. Repeat __________ times. Complete this exercise __________ times a day. Posture and body mechanics Good posture and healthy body mechanics can help to relieve stress in your body's tissues and joints. Body mechanics refers to the movements and positions of your body while you do your daily activities. Posture is part of body mechanics. Good posture means:  Your spine is in its natural S-curve position (neutral).  Your shoulders are pulled back slightly.  Your head is not tipped forward. Follow these guidelines to improve your posture and body  mechanics in your everyday activities. Standing   When standing, keep your spine neutral and your feet about hip width apart. Keep a slight bend in your knees. Your ears, shoulders, and hips should line up.  When you do a task in which you stand in one place for a long time, place one foot up on a stable object that is 2-4 inches (5-10 cm) high, such as a footstool. This helps keep your spine neutral. Sitting   When sitting, keep your spine neutral and keep your feet flat on the floor. Use a footrest, if necessary, and keep your thighs parallel to the floor. Avoid rounding your shoulders, and avoid tilting your head forward.  When working at a desk or a computer, keep your desk at a height where your hands are slightly lower than your elbows. Slide your chair under your desk so you are close enough to maintain good posture.  When working at a computer, place your monitor at a height where you are looking straight ahead and you do not have to tilt your head forward or downward to look at the screen. Resting  When lying down and resting, avoid positions that are most painful for you.  If you have pain with activities such as sitting, bending, stooping, or squatting, lie in a position in which your body does not bend very much. For example, avoid curling up on your side with your arms and knees near your chest (fetal position).  If you have pain with activities such as standing for a long time or reaching with your arms, lie with your spine in a neutral position and bend your knees slightly. Try the following positions: ? Lying on your side with a pillow between your knees. ? Lying on your back with a pillow under your knees. Lifting   When lifting objects, keep your feet at least shoulder width apart and tighten your abdominal muscles.  Bend your knees and hips and keep your  spine neutral. It is important to lift using the strength of your legs, not your back. Do not lock your knees  straight out.  Always ask for help to lift heavy or awkward objects. This information is not intended to replace advice given to you by your health care provider. Make sure you discuss any questions you have with your health care provider. Document Released: 05/13/2005 Document Revised: 09/04/2018 Document Reviewed: 06/04/2018 Elsevier Patient Education  2020 [employer].

## 2018-12-02 NOTE — Progress Notes (Signed)
Subjective:    Patient ID: Penny Thomas, female    DOB: Jun 30, 1985, 33 y.o.   MRN: [id number]  HPI Chief Complaint  Patient presents with  . new pt    new pt get established, back pain- 2 week- doesn't remember doing anything. blood sugar- prediabetes back 2010.    She is new to the practice.  Other providers:  Dr. Tomi Likens- neurologist for migraines  Elon Alas- OB/GYN   Complains of a 2 week history of low back pain, mostly on the left. Pain shoots down into her buttock but not into her leg.   Denies injury. No history of similar back pain or back surgery.   States she has tried using a heating pad and Aleve. Has taken 3 at one time twice daily without relief.  States she took an oxycodone that she has for endometriosis pain.   History of low back pain in 06/2018. States it was not like this.   No numbness, tingling or weakness. No saddle anesthesia. No loss of control of bowels or bladder.  Denies fever, chills, chest pain, palpitations, shortness of breath, abdominal pain, N/V/D, urinary symptoms.    Reports having a history of prediabetes in 2010. Gestational diabetes with her last child. Would like to have this checked.   Asthma- since childhood. Triggers are "everything" per patient. Using Symbicort daily. No flares recently   Denies fever, chills, dizziness, chest pain, palpitations, shortness of breath, abdominal pain, N/V/D, urinary symptoms, LE edema.   She is married and has 4 kids. Twins who are age 32, a 36 and 61 year old. States she works for [employer].   Reviewed allergies, medications, past medical, surgical, family, and social history.   Review of Systems Pertinent positives and negatives in the history of present illness.     Objective:   Physical Exam BP 110/70   Pulse 78   Temp 99 F (37.2 C) (Oral)   Ht 5\' 8"  (1.727 m)   Wt 178 lb 3.2 oz (80.8 kg)   BMI 27.10 kg/m   Alert and oriented and in no distress.  Neck is supple with normal  range of motion.  Cardiac exam shows a regular sinus rhythm without murmurs or gallops. Lungs are clear to auscultation.  Back with normal sensation and motion.  She does have pain with extreme flexion, hyperextension and right lateral bending.  No skin changes.  She has TTP over left sciatic notch.  I am able to reproduce her pain by pressing deeply into the left buttock.  Bilateral lower extremities with equal strength.  DTRs intact and symmetric.  Negative straight leg raise.       Assessment & Plan:  Acute left-sided low back pain with left-sided sciatica - Plan: meloxicam (MOBIC) 15 MG tablet, methocarbamol (ROBAXIN) 500 MG tablet.  No known injury.  Discussed conservative treatment for acute back pain.  No neurological deficits. Stop Aleve and try the meloxicam. Also use heat or ice and topical analgesic. I gave her a list of back exercises to try if tolerated.  Discussed other treatment options such as physical therapy and she would like to hold off on this for now.  I also offered to send her to Ortho if this does not resolve or if she has further issues with her back.  Prediabetes - Plan: HgB A1c is 6.1% and she continues being in the prediabetes range.  Discussed the diabetes spectrum and that our goal is to postpone or prevent her from having  diabetes.  Discussed healthy lifestyle with low-carb diet and getting adequate physical activity.  History of gestational diabetes - Plan: Discussed that this does put her at an increased risk of developing diabetes down the road.  Mild persistent asthma without complication - Plan: No recent flares.  Doing well with Symbicort twice daily.  She tries to avoid triggers.

## 2018-12-07 ENCOUNTER — Encounter: Payer: Self-pay | Admitting: Women's Health

## 2018-12-08 ENCOUNTER — Other Ambulatory Visit: Payer: Self-pay

## 2018-12-08 ENCOUNTER — Encounter: Payer: Self-pay | Admitting: Women's Health

## 2018-12-08 ENCOUNTER — Ambulatory Visit: Payer: 59 | Admitting: Women's Health

## 2018-12-08 VITALS — BP 118/80

## 2018-12-08 DIAGNOSIS — R102 Pelvic and perineal pain: Secondary | ICD-10-CM

## 2018-12-08 DIAGNOSIS — R109 Unspecified abdominal pain: Secondary | ICD-10-CM

## 2018-12-08 LAB — WET PREP FOR TRICH, YEAST, CLUE

## 2018-12-08 MED ORDER — TRAMADOL HCL 50 MG PO TABS: 50.0000 mg | ORAL_TABLET | Freq: Four times a day (QID) | ORAL | 0 refills | Status: DC | PRN

## 2018-12-08 MED FILL — traMADol HCL 50 MG TABS: 50 | 8 days supply | Qty: 30 | Fill #0

## 2018-12-08 NOTE — Progress Notes (Signed)
33 year old MBF G4, P4 (sons) presents with severe pelvic cramping for 1 week, states pain was at an 8-9 last week is at a 6 today but has not felt this bad for at least 1 year.  Long-term history of endometriosis with severe dysmenorrhea.  2013 endometrial ablation, has been on [name] since 2019 amenorrheic with good relief of endometriosis pain.  BTL.  Denies dyspareunia but has cramping discomfort after intercourse.  Denies vaginal discharge, urinary symptoms, change in GI, constipation/diarrhea or nausea.  Uses Roxicet for severe pain, states had to use several last week.  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  Exam: Appears comfortable, no CVAT.  Abdomen soft, no rebound, external genitalia within normal limits, speculum exam scant white discharge, wet prep negative.  Bimanual tenderness with exam right, center and left pelvis.  Pelvic cramping/endometriosis on [name]  Plan: Ultram 50 mg every 8 hours as needed, reviewed addictive properties, less strong/addictive than Roxicet.  Offered ultrasound, will watch at this time.  Continue [name].  reviewed laparoscopic surgery, sister had hysterectomy due to endometriosis.

## 2018-12-08 NOTE — Patient Instructions (Signed)
Pelvic Pain, Female Pelvic pain is pain in your lower abdomen, below your belly button and between your hips. The pain may start suddenly (be acute), keep coming back (be recurring), or last a long time (become chronic). Pelvic pain that lasts longer than 6 months is considered chronic. Pelvic pain may affect your:  Reproductive organs.  Urinary system.  Digestive tract.  Musculoskeletal system. There are many potential causes of pelvic pain. Sometimes, the pain can be a result of digestive or urinary conditions, strained muscles or ligaments, or reproductive conditions. Sometimes the cause of pelvic pain is not known. Follow these instructions at home:   Take over-the-counter and prescription medicines only as told by your health care provider.  Rest as told by your health care provider.  Do not have sex if it hurts.  Keep a journal of your pelvic pain. Write down: ? When the pain started. ? Where the pain is located. ? What seems to make the pain better or worse, such as food or your period (menstrual cycle). ? Any symptoms you have along with the pain.  Keep all follow-up visits as told by your health care provider. This is important. Contact a health care provider if:  Medicine does not help your pain.  Your pain comes back.  You have new symptoms.  You have abnormal vaginal discharge or bleeding, including bleeding after menopause.  You have a fever or chills.  You are constipated.  You have blood in your urine or stool.  You have foul-smelling urine.  You feel weak or light-headed. Get help right away if:  You have sudden severe pain.  Your pain gets steadily worse.  You have severe pain along with fever, nausea, vomiting, or excessive sweating.  You lose consciousness. Summary  Pelvic pain is pain in your lower abdomen, below your belly button and between your hips.  There are many potential causes of pelvic pain.  Keep a journal of your pelvic  pain. This information is not intended to replace advice given to you by your health care provider. Make sure you discuss any questions you have with your health care provider. Document Released: 04/09/2004 Document Revised: 10/29/2017 Document Reviewed: 10/29/2017 Elsevier Patient Education  2020 [employer].

## 2018-12-12 ENCOUNTER — Encounter: Payer: Self-pay | Admitting: Family Medicine

## 2018-12-12 ENCOUNTER — Encounter: Payer: Self-pay | Admitting: Women's Health

## 2018-12-12 DIAGNOSIS — M5442 Lumbago with sciatica, left side: Secondary | ICD-10-CM

## 2018-12-15 ENCOUNTER — Other Ambulatory Visit: Payer: Self-pay

## 2018-12-16 ENCOUNTER — Ambulatory Visit: Payer: 59 | Admitting: Obstetrics & Gynecology

## 2018-12-16 ENCOUNTER — Encounter: Payer: Self-pay | Admitting: Obstetrics & Gynecology

## 2018-12-16 VITALS — BP 124/78

## 2018-12-16 DIAGNOSIS — R102 Pelvic and perineal pain: Secondary | ICD-10-CM | POA: Diagnosis not present

## 2018-12-16 DIAGNOSIS — IMO0002 Reserved for concepts with insufficient information to code with codable children: Secondary | ICD-10-CM

## 2018-12-16 DIAGNOSIS — N803 Endometriosis of pelvic peritoneum: Secondary | ICD-10-CM

## 2018-12-16 NOTE — Progress Notes (Signed)
Penny Thomas May 23, 1986 [id number]   History:    33 y.o. G3P3L4 Married.  S/P Tubal ligation. Nurse.  Children 74, twin 27, 30 yo.  RP:  Consultation for management of Endometriosis  HPI:  Had 3 C/S, 74rd with a TL.  Menorrhagia with dysmenorrhea/pelvic pain x many yrs which led to [name] Endometrial ablation in 2013.  Since then, no menses, but still having a week of menstrual-like cramps every month.  No surgical/pathologic confirmation of Dx of Endometriosis.  Patient's pelvic pain increased sharply at the end of 07/2017.  Patient had been on Narcotics to control the pain without success.  She was started on [name] 150 mg daily in 08/2017.  Penny Thomas improved her level of pain significantly for about a year, but now the pain is increasing again.  She is using Tramadol to be able to stay functional and work.  Pain post coitally.  Urine/BMs normal.    Past medical history,surgical history, family history and social history were all reviewed and documented in the EPIC chart.  Gynecologic History No LMP recorded. Patient has had an ablation. Contraception: tubal ligation Last Pap: 06/2016. Results were: Negative Last mammogram: Never  Obstetric History OB History  Gravida Para Term Preterm AB Living  3 3       4   SAB TAB Ectopic Multiple Live Births        1 4    # Outcome Date GA Lbr Len/2nd Weight Sex Delivery Anes PTL Lv  3 Para 01/20/09 [redacted]w[redacted]d    CS-Classical   LIV  2A Para 09/28/05 [redacted]w[redacted]d    Vag-Spont   LIV     Birth Comments: twins  2B Para 09/28/05 [redacted]w[redacted]d    CS-Classical   LIV  1 Para 07/03/04 [redacted]w[redacted]d    Vag-Spont   LIV     ROS: A ROS was performed and pertinent positives and negatives are included in the history.  GENERAL: No fevers or chills. HEENT: No change in vision, no earache, sore throat or sinus congestion. NECK: No pain or stiffness. CARDIOVASCULAR: No chest pain or pressure. No palpitations. PULMONARY: No shortness of breath, cough or wheeze.  GASTROINTESTINAL: No abdominal pain, nausea, vomiting or diarrhea, melena or bright red blood per rectum. GENITOURINARY: No urinary frequency, urgency, hesitancy or dysuria. MUSCULOSKELETAL: No joint or muscle pain, no back pain, no recent trauma. DERMATOLOGIC: No rash, no itching, no lesions. ENDOCRINE: No polyuria, polydipsia, no heat or cold intolerance. No recent change in weight. HEMATOLOGICAL: No anemia or easy bruising or bleeding. NEUROLOGIC: No headache, seizures, numbness, tingling or weakness. PSYCHIATRIC: No depression, no loss of interest in normal activity or change in sleep pattern.     Exam:   BP 124/78   General appearance : Well developed well nourished female. No acute distress HEENT: Eyes: no retinal hemorrhage or exudates,  Neck supple, trachea midline, no carotid bruits, no thyroidmegaly Lungs: Clear to auscultation, no rhonchi or wheezes, or rib retractions  Heart: Regular rate and rhythm, no murmurs or gallops Breast:Examined in sitting and supine position were symmetrical in appearance, no palpable masses or tenderness,  no skin retraction, no nipple inversion, no nipple discharge, no skin discoloration, no axillary or supraclavicular lymphadenopathy Abdomen: no palpable masses or tenderness, no rebound or guarding Extremities: no edema or skin discoloration or tenderness  Pelvic: Vulva: Normal             Vagina: No gross lesions or discharge  Cervix: No gross lesions or discharge  Uterus  AV, normal size, shape and consistency, mildly tender and mobile  Adnexa  Without masses.  Mild tenderness bilaterally.  Anus: Normal   Assessment/Plan:  33 y.o. female for annual exam   1. Pelvic pain in female Chronic pelvic pain which is probably due to endometriosis, but no surgical/pathology: Diagnosis made so far.  Patient has taken narcotics for many years and is now trying to control the pain with tramadol to remain functional at work.  On [name] since April 2019 with  improvement of the pain for about a year and now worsening again.  Depo-Lupron discussed but not recommended following her release.  Decision to proceed with a Dx LPS with conservative treatment of endometriosis.  We will complete the investigation with a pelvic ultrasound at follow-up preop visit. - US Transvaginal Non-OB; Future  2. Endometriosis of pelvis Decision to proceed with a Dx LPS with conservative treatment of endometriosis.  Final decision on management plan at follow-up with the pelvic ultrasound results. - US Transvaginal Non-OB; Future  Counseling on above issues and coordination of care more than 50% for 25 minutes.  Penny Bruins MD, 3:05 PM 12/16/2018

## 2018-12-16 NOTE — Patient Instructions (Signed)
  1. Pelvic pain in female Chronic pelvic pain which is probably due to endometriosis, but no surgical/pathology: Diagnosis made so far.  Patient has taken narcotics for many years and is now trying to control the pain with tramadol to remain functional at work.  On [name] since April 2019 with improvement of the pain for about a year and now worsening again.  Depo-Lupron discussed but not recommended following her release.  Decision to proceed with a Dx LPS with conservative treatment of endometriosis.  We will complete the investigation with a pelvic ultrasound at follow-up preop visit. - US Transvaginal Non-OB; Future  2. Endometriosis of pelvis Decision to proceed with a Dx LPS with conservative treatment of endometriosis.  Final decision on management plan at follow-up with the pelvic ultrasound results. - US Transvaginal Non-OB; Future  [name], it was a pleasure meeting you today!

## 2018-12-21 ENCOUNTER — Encounter: Payer: Self-pay | Admitting: Family Medicine

## 2018-12-21 ENCOUNTER — Ambulatory Visit (INDEPENDENT_AMBULATORY_CARE_PROVIDER_SITE_OTHER): Payer: 59

## 2018-12-21 ENCOUNTER — Ambulatory Visit (INDEPENDENT_AMBULATORY_CARE_PROVIDER_SITE_OTHER): Payer: 59 | Admitting: Family Medicine

## 2018-12-21 DIAGNOSIS — M5442 Lumbago with sciatica, left side: Secondary | ICD-10-CM

## 2018-12-21 DIAGNOSIS — M533 Sacrococcygeal disorders, not elsewhere classified: Secondary | ICD-10-CM | POA: Diagnosis not present

## 2018-12-21 MED ORDER — TIZANIDINE HCL 2 MG PO TABS: 2.0000 mg | ORAL_TABLET | Freq: Four times a day (QID) | ORAL | 1 refills | Status: DC | PRN

## 2018-12-21 MED ORDER — NABUMETONE 750 MG PO TABS: 750.0000 mg | ORAL_TABLET | Freq: Two times a day (BID) | ORAL | 6 refills | Status: DC | PRN

## 2018-12-21 MED FILL — NABUMETONE 750 MG TABS: 750 | 30 days supply | Qty: 60 | Fill #0

## 2018-12-21 MED FILL — PROPRANOLOL HCL ER 80 MG CP: 80 | 30 days supply | Qty: 30 | Fill #3

## 2018-12-21 MED FILL — tiZANidine HCL 2 MG TABS: 2 | 8 days supply | Qty: 60 | Fill #0

## 2018-12-21 NOTE — Progress Notes (Signed)
Penny Thomas - 33 y.o. female MRN [id number]  Date of birth: 1985/10/28  Office Visit Note: Visit Date: 12/21/2018 PCP: Patient, No Pcp Per Referred by: Girtha Rm, NP-C  Subjective: Chief Complaint  Patient presents with  . Lower Back - Pain    Low back pain x 3 months. On left more than right. Occasional sharp pains & "locking up" of the left leg. Numbness/tingling in the foot. NKI. Had problems 4 years ago on right side.   HPI: Penny Thomas is a 33 y.o. female who comes in today with low back pain.  She reports that she has had low back pain for the past 3 months that has been gradually worsening. It has gotten to the point where it hurts in every position. Now when she walks in the neighborhood, her left lower back starts throbbing after 5 minutes. Reports that she has pain in back all day- ranges from 1/10 to 9/10. Currently an 8/10. Meloxicam and Robaxin are not helping.  Shooting pains down left leg with left foot numbness 1-2 x a week. Denies inciting injury or single event.  Back pain started in 2016. She has 4 kids, born 2006-2010. Works as a Product/process development scientist. Able to work but in pain.   Otherwise per HPI.  Assessment & Plan: Visit Diagnoses:  1. Left-sided low back pain with left-sided sciatica, unspecified chronicity   2. Sacroiliac joint dysfunction of left side    Exam findings consistent with SI joint dysfunction with tenderness over SI joint, pain with FABER, stork sign.   Plan:  - referral to chiropractor  - pain medications as below  Meds & Orders:  Meds ordered this encounter  Medications  . tiZANidine (ZANAFLEX) 2 MG tablet    Sig: Take 1-2 tablets (2-4 mg total) by mouth every 6 (six) hours as needed for muscle spasms.    Dispense:  60 tablet    Refill:  1  . nabumetone (RELAFEN) 750 MG tablet    Sig: Take 1 tablet (750 mg total) by mouth 2 (two) times daily as needed.    Dispense:  60 tablet    Refill:  6    Orders Placed  This Encounter  Procedures  . XR Lumbar Spine 2-3 Views  . Ambulatory referral to Chiropractic    Follow-up: PRN  Procedures: No procedures performed  No notes on file   Clinical History: No specialty comments available.   She reports that she has never smoked. She has never used smokeless tobacco.  Recent Labs    07/14/18 1618 12/02/18 1406  HGBA1C 5.6 6.1*    Objective:  VS:  HT:    WT:   BMI:     BP:   HR: bpm  TEMP: ( )  RESP:  Physical Exam  PHYSICAL EXAM: Gen: NAD, alert, cooperative with exam, well-appearing HEENT: clear conjunctiva,  CV:  no edema, capillary refill brisk, normal rate Resp: non-labored Skin: no rashes, normal turgor  Neuro: no gross deficits.  Psych:  alert and oriented  Ortho Exam  Lumbar spine: - Inspection: no gross deformity or asymmetry, swelling or ecchymosis - Palpation: TTP over L5 spinous process, paraspinal muscles, point of maximal tenderness is left SI joint - ROM: limited lumbar spine flexion and extension secondary to pain - Strength: 5/5 strength of lower extremity in L4-S1 nerve root distributions b/l; normal gait - Neuro: sensation intact in the L4-S1 nerve root distribution b/l, 2+ L4 and S1 reflexes - Special testing: Negative  straight leg raise, negative slump, positive left side Stork test, Positive FABER on left and positive cross FABER on right  Imaging: Xr Lumbar Spine 2-3 Views  Result Date: 12/21/2018 X-rays show mild degenerative disc disease at L5-S1.  No sign of compression deformity.  No congenital deformities.   Past Medical/Family/Surgical/Social History: Medications & Allergies reviewed per EMR, new medications updated. Patient Active Problem List   Diagnosis Date Noted  . History of gestational diabetes 12/02/2018  . Prediabetes 12/02/2018  . Mild persistent asthma without complication 09/98/3382  . Acute nasopharyngitis 12/11/2016  . Intractable chronic migraine without aura and without status  migrainosus 06/23/2015  . Migraines 03/23/2015  . Right-sided low back pain without sciatica 03/23/2015  . Muscle cramps 03/23/2015  . Dysmenorrhea 04/08/2013  . Endometriosis 04/08/2013   Past Medical History:  Diagnosis Date  . Allergy   . Anemia    takes iron supplement  . Asthma   . Asthma    prn inhaler  . Blood transfusion 2007  . Blood transfusion 2010  . Blood transfusion without reported diagnosis   . 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 Hemorrhage in uterus 01-20-09  . Migraines   . 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 Seasonal allergies   . Sickle cell anemia (HCC)    trait  . Sickle cell trait (HCC)    Family History  Problem Relation Age of Onset  . Fibromyalgia Mother   . Multiple sclerosis Mother   . Asthma Father   . Healthy Maternal Grandmother   . Healthy Maternal Grandfather   . Healthy Paternal Grandmother   . Healthy Paternal Grandfather   . Endometriosis Sister    Past Surgical History:  Procedure Laterality Date  . CESAREAN SECTION  09-28-05  . CESAREAN SECTION  01-20-09  . CESAREAN SECTION    . ENDOMETRIAL ABLATION  10/2011   HEROPTIOIN  . KNEE ARTHROSCOPY Right 02/16/2014   Procedure: RIGHT ARTHROSCOPY KNEE;  Surgeon: Kerin Salen, MD;  Location: [facility];  Service: Orthopedics;  Laterality: Right;  . POLYP REMOVAL  5-08   ?CERVICAL OR UTERINE POLYP  . TUBAL LIGATION  01-20-09   DONE WITH C-SECTION  . WISDOM TOOTH EXTRACTION Right 2014   Social History   Occupational History  . Occupation: NT/Transporter  Tobacco Use  . Smoking status: Never Smoker  . Smokeless tobacco: Never Used  Substance and Sexual Activity  . Alcohol use: No    Alcohol/week: 0.0 standard drinks    Comment: seldom  . Drug use: No  . Sexual activity: Yes    Birth control/protection: Surgical, None    Comment: TUBAL LIGATION, INTERCOURSE AGE 49, SEXUAL PARTNERS 5

## 2018-12-21 NOTE — Progress Notes (Signed)
.  I saw and examined the patient with Dr. Mayer Masker and agree with assessment and plan as outlined.  Examination consistent with left-sided SI joint dysfunction.  She does have some paresthesias in the left leg but there was no injury to cause this pain.  Cannot completely rule out disc protrusion, although I think SI dysfunction is more likely.  We will refer her to chiropractor and give her some different medications.  If this is not helping then a short course of physical therapy before ordering MRI scan.

## 2019-01-07 ENCOUNTER — Encounter: Payer: Self-pay | Admitting: Women's Health

## 2019-01-07 MED ORDER — TRAMADOL HCL 50 MG PO TABS: 50.0000 mg | ORAL_TABLET | Freq: Four times a day (QID) | ORAL | 0 refills | Status: DC | PRN

## 2019-01-07 MED FILL — traMADol HCL 50 MG TABS: 50 | 7 days supply | Qty: 30 | Fill #0

## 2019-01-07 NOTE — Telephone Encounter (Signed)
Per [facility] staff message "Ok to give refill of tramadol #30 no refills "   Rx called in

## 2019-01-15 MED FILL — tiZANidine HCL 2 MG TABS: 2 | 8 days supply | Qty: 60 | Fill #1

## 2019-01-15 MED FILL — PROPRANOLOL HCL ER 80 MG CP: 80 | 30 days supply | Qty: 30 | Fill #4

## 2019-01-22 ENCOUNTER — Other Ambulatory Visit: Payer: Self-pay

## 2019-01-25 ENCOUNTER — Ambulatory Visit: Payer: 59 | Admitting: Obstetrics & Gynecology

## 2019-01-25 ENCOUNTER — Ambulatory Visit (INDEPENDENT_AMBULATORY_CARE_PROVIDER_SITE_OTHER): Payer: 59

## 2019-01-25 ENCOUNTER — Encounter: Payer: Self-pay | Admitting: Obstetrics & Gynecology

## 2019-01-25 ENCOUNTER — Other Ambulatory Visit: Payer: Self-pay

## 2019-01-25 ENCOUNTER — Encounter: Payer: Self-pay | Admitting: Family Medicine

## 2019-01-25 ENCOUNTER — Other Ambulatory Visit: Payer: Self-pay | Admitting: Obstetrics & Gynecology

## 2019-01-25 VITALS — BP 126/78

## 2019-01-25 DIAGNOSIS — N858 Other specified noninflammatory disorders of uterus: Secondary | ICD-10-CM

## 2019-01-25 DIAGNOSIS — R102 Pelvic and perineal pain: Secondary | ICD-10-CM

## 2019-01-25 DIAGNOSIS — Z9889 Other specified postprocedural states: Secondary | ICD-10-CM | POA: Diagnosis not present

## 2019-01-25 DIAGNOSIS — M533 Sacrococcygeal disorders, not elsewhere classified: Secondary | ICD-10-CM

## 2019-01-25 DIAGNOSIS — M5442 Lumbago with sciatica, left side: Secondary | ICD-10-CM

## 2019-01-25 NOTE — Progress Notes (Signed)
    Penny Thomas [birth year] [id number]        33 y.o.  G3P3L3 Married.  S/P Tubal ligation. Nurse.  Children 67, twin 13, 11 yo.  RP:  Pelvic pain s/p Endometrial ablation for Pelvic US  HPI:  Had 3 C/S, 3rd with a TL.  Menorrhagia with dysmenorrhea/pelvic pain x many yrs which led to [name] Endometrial ablation in 2013.  Since then, no menses, but still having a week of menstrual-like cramps every month.  No surgical/pathologic confirmation of Dx of Endometriosis.  Patient's pelvic pain increased sharply at the end of 07/2017.  Patient had been on Narcotics to control the pain without success.  She was started on [name] 150 mg daily in 08/2017.  [name] improved her level of pain significantly for about a year, but now the pain is increasing again.  She is using Tramadol to be able to stay functional and work.  Pain post coitally.  Urine/BMs normal.      OB History  Gravida Para Term Preterm AB Living  3 3       4   SAB TAB Ectopic Multiple Live Births        1 4    # Outcome Date GA Lbr Len/2nd Weight Sex Delivery Anes PTL Lv  3 Para 01/20/09 [redacted]w[redacted]d    CS-Classical   LIV  2A Para 09/28/05 [redacted]w[redacted]d    Vag-Spont   LIV     Birth Comments: twins  2B Para 09/28/05 [redacted]w[redacted]d    CS-Classical   LIV  1 Para 07/03/04 [redacted]w[redacted]d    Vag-Spont   LIV    Past medical history,surgical history, problem list, medications, allergies, family history and social history were all reviewed and documented in the EPIC chart.   Directed ROS with pertinent positives and negatives documented in the history of present illness/assessment and plan.  Exam:  Vitals:   01/25/19 0916  BP: 126/78   General appearance:  Normal  Pelvic US today: T/V images.  Uterus anteverted homogenous measuring 6.72 x 5.28 x 3.21 cm.  Endometrial lining thin and normal at the lower uterine segment measuring 3.4 mm.  At the fundus the endometrial lining cannot be assessed because of an adjacent thin-walled cystic mass measuring  2.0 x 0.8 x 2.1 cm suggestive of a hydrometra, negative color flow Doppler.  Right and left ovaries normal.  No apparent mass in the right or left adnexa.  No free fluid in the posterior cul-de-sac.   Assessment/Plan:  33 y.o. G3P3   1. Pelvic pain in female Pelvic ultrasound findings reviewed with patient.  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  Probably the cause of patient's pelvic pain and cramping.  We will follow-up with a sonohysterogram to complete the investigation.  Patient agrees with plan.  Sonohysterogram scheduled. - [place] Sonohysterogram; Future  2. 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 - [place] Sonohysterogram; Future  Counseling on above issues and coordination of care more than 50% for 15 minutes.  [name] MD, 9:35 AM 01/25/2019

## 2019-01-25 NOTE — Patient Instructions (Signed)
1. Pelvic pain in female Pelvic ultrasound findings reviewed with patient.  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  Probably the cause of patient's pelvic pain and cramping.  We will follow-up with a sonohysterogram to complete the investigation.  Patient agrees with plan.  Sonohysterogram scheduled. - [place] Sonohysterogram; Future  2. 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 - [place] Sonohysterogram; Future  [name], it was a pleasure seeing you today!

## 2019-02-05 ENCOUNTER — Encounter: Payer: Self-pay | Admitting: Family Medicine

## 2019-02-09 ENCOUNTER — Other Ambulatory Visit: Payer: Self-pay

## 2019-02-14 NOTE — Progress Notes (Addendum)
Virtual Visit via Video Note The purpose of this virtual visit is to provide medical care while limiting exposure to the novel coronavirus.    Consent was obtained for video visit:  Yes Answered questions that patient had about telehealth interaction:  Yes I discussed the limitations, risks, security and privacy concerns of performing an evaluation and management service by telemedicine. I also discussed with the patient that there may be a patient responsible charge related to this service. The patient expressed understanding and agreed to proceed.  Pt location: Home Physician Location: office Name of referring provider:  Girtha Rm, NP-C I connected with Silvestre Mesi at patients initiation/request on 02/15/2019 at  3:30 PM EDT by video enabled telemedicine application and verified that I am speaking with the correct person using two identifiers. Pt MRN:  [id number] Pt DOB:  05-Apr-1986 Video Participants:  Silvestre Mesi   History of Present Illness:  Penny Thomas is a 33 year old right-handed woman who follows up for migraines.  UPDATE: Has not taken the Reyvow.  No migraines for the past 3 months. Intensity:  0 Duration:  0 Frequency:  0 Frequency of abortive medication:0 Rescue protocol: First line, naproxen 500mg  (at work), will take sumatriptan [place] or Reyvow at home. Current NSAID:  Naproxen 500mg  Current analgesics:none Current triptans:Sumatriptan [place]  Current ergotamine:none Other abortive therapy:  Reyvow Current anti-emetic:none Current muscle relaxants:tizanidine 2mg , Robaxin Current anti-anxiolytic:none Current sleep aide:none Current Antihypertensive medications:Propranolol ER 80mg  Current Antidepressant medications:none Current Anticonvulsant medications:none Current anti-CGRP:none Current Vitamins/Herbal/Supplements:none Current Antihistamines/Decongestants:none Other therapy:none Hormone/birth  control:  [name] Other medication:none  Caffeine:no Alcohol:seldom Smoker:no Diet:hydrates Exercise:no Depression:no; Anxiety:no Other pain:no Sleep hygiene:okay  HISTORY: Onset:  Age 49. Location:Varies (bi-frontal, back of head) Quality:Squeezing/"feels like I am hit in the head with a hammer" Initial Intensity:9-10/10 Aura:blurred vision Prodrome:no Associated symptoms: Nausea, dizziness, photophobia, osmophobia Initial Duration:1 to 3 days initiail Frequency:3 to 4 days per week (16 headache days per week) Triggers:  Menstrual cycle, caffeine, scents (perfume, deoderant, hand sanitizer), change in weather Relieving factors: Laying down Activity:Lays down if severe  Past NSAIDS:Mobic, ibuprofen 800mg , [place] Past analgesics:Hydrocodone-acetaminophen, tramadol 50mg , Roxicet, Excedrin Migraine Past abortive triptans:sumatriptan 100mg , Maxalt 10mg , Relpax 20mg , sumatriptan  Past ergot: none Past muscle relaxants:Flexeril, baclofen Past anti-nausea:Zofran 4mg , promethazine 25mg  Past antihypertensive medications:no Past antidepressant medications:amitriptyline 50mg  (weight gain) Past anticonvulsant medications:topiramate 150mg  (effective but caused hair loss) Past vitamins/Herbal/Supplements:no Past antihistamines/decongestants:no Other past medications:no  Family history of headache:Mom, sister, grandmother  Past Medical History: Past Medical History:  Diagnosis Date  . Allergy   . Anemia    takes iron supplement  . Asthma   . Asthma    prn inhaler  . Blood transfusion 2007  . Blood transfusion 2010  . Blood transfusion without reported diagnosis   . 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 Hemorrhage in uterus 01-20-09  . Migraines   . 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 Seasonal allergies   . Sickle cell anemia (HCC)    trait  . Sickle cell trait (HCC)     Medications: Outpatient  Encounter Medications as of 02/15/2019  Medication Sig  . albuterol (PROVENTIL HFA;VENTOLIN HFA) 108 (90 Base) MCG/ACT inhaler Inhale 1-2 puffs into the lungs every 6 (six) hours as needed for wheezing or shortness of breath.  . Biotin 1000 MCG tablet Take 2,000 mcg by mouth 3 (three) times daily.  . budesonide-formoterol (SYMBICORT) 160-4.5 MCG/ACT inhaler Inhale 2 puffs into the lungs 2 (two) times daily.  Marland Kitchen  cetirizine (ZYRTEC) 10 MG tablet Take 1 tablet (10 mg total) by mouth daily.  . Elagolix Sodium ([name]) 150 MG TABS Take 150 mg by mouth every morning.  [place] Succinate (REYVOW) 100 MG TABS Take 1 tablet by mouth daily as needed (No more than 1 tab in 24 h).  . meloxicam (MOBIC) 15 MG tablet Take 1 tablet (15 mg total) by mouth daily.  . methocarbamol (ROBAXIN) 500 MG tablet Take 1 tablet (500 mg total) by mouth at bedtime as needed for muscle spasms.  Marland Kitchen olopatadine (PATANOL) 0.1 % ophthalmic solution Place 1 drop into both eyes 2 (two) times daily as needed for allergies.  Marland Kitchen propranolol ER (INDERAL LA) 80 MG 24 hr capsule TAKE 1 CAPSULE BY MOUTH ONCE DAILY  . tiZANidine (ZANAFLEX) 2 MG tablet Take 1-2 tablets (2-4 mg total) by mouth every 6 (six) hours as needed for muscle spasms.   No facility-administered encounter medications on file as of 02/15/2019.     Allergies: No Known Allergies  Family History: Family History  Problem Relation Age of Onset  . Fibromyalgia Mother   . Multiple sclerosis Mother   . Asthma Father   . Healthy Maternal Grandmother   . Healthy Maternal Grandfather   . Healthy Paternal Grandmother   . Healthy Paternal Grandfather   . Endometriosis Sister   . Healthy Brother   . Healthy Child     Social History: Social History   Socioeconomic History  . Marital status: Married    Spouse name: Not on file  . Number of children: 4  . Years of education: 32  . Highest education level: Associate degree: academic program  Occupational History   . Occupation: NT/Transporter  Social Needs  . Financial resource strain: Not on file  . Food insecurity    Worry: Not on file    Inability: Not on file  . Transportation needs    Medical: Not on file    Non-medical: Not on file  Tobacco Use  . Smoking status: Never Smoker  . Smokeless tobacco: Never Used  Substance and Sexual Activity  . Alcohol use: No    Alcohol/week: 0.0 standard drinks    Comment: seldom  . Drug use: No  . Sexual activity: Yes    Birth control/protection: Surgical, None    Comment: TUBAL LIGATION, INTERCOURSE AGE 62, SEXUAL PARTNERS 5  Lifestyle  . Physical activity    Days per week: Not on file    Minutes per session: Not on file  . Stress: Not on file  Relationships  . Social Herbalist on phone: Not on file    Gets together: Not on file    Attends religious service: Not on file    Active member of club or organization: Not on file    Attends meetings of clubs or organizations: Not on file    Relationship status: Not on file  . Intimate partner violence    Fear of current or ex partner: Not on file    Emotionally abused: Not on file    Physically abused: Not on file    Forced sexual activity: Not on file  Other Topics Concern  . Not on file  Social History Narrative   Fun: Baking, playing with the children.   Denies abuse and feels safe at home.       Pt does not drink coffee, tea or soda   Pt lives with spouse and 4 children   Pt is right  handed   She has a associates degree        Observations/Objective:   Height 5\' 7"  (1.702 m), weight 168 lb (76.2 kg). No acute distress.  Alert and oriented.  Speech fluent and not dysarthric.  Language intact.  Eyes orthophoric on primary gaze.  Face symmetric.  Assessment and Plan:   Migraine with aura, without status migrainosus, not intractable Migraine without aura, without status migrainosus, not intractable STABLE  1.  For preventative management, propranolol ER 80mg  able  (refilled) 2.  For abortive therapy, 1.  Naproxen, 2.  Sumatriptan NS or Reyvow 3.  Limit use of pain relievers to no more than 2 days out of week to prevent risk of rebound or medication-overuse headache. 4.  Keep headache diary 5.  Exercise, hydration, caffeine cessation, sleep hygiene, monitor for and avoid triggers 6.  Consider:  magnesium citrate 400mg  daily, riboflavin 400mg  daily, and coenzyme Q10 100mg  three times daily 7. Always keep in mind that currently taking a hormone or birth control may be a possible trigger or aggravating factor for migraine. 8. Follow up in 6 months.   Follow Up Instructions:    -I discussed the assessment and treatment plan with the patient. The patient was provided an opportunity to ask questions and all were answered. The patient agreed with the plan and demonstrated an understanding of the instructions.   The patient was advised to call back or seek an in-person evaluation if the symptoms worsen or if the condition fails to improve as anticipated.   Dudley Major, DO

## 2019-02-15 ENCOUNTER — Telehealth (INDEPENDENT_AMBULATORY_CARE_PROVIDER_SITE_OTHER): Payer: 59 | Admitting: Neurology

## 2019-02-15 ENCOUNTER — Encounter: Payer: Self-pay | Admitting: Neurology

## 2019-02-15 ENCOUNTER — Other Ambulatory Visit: Payer: Self-pay

## 2019-02-15 ENCOUNTER — Encounter: Payer: Self-pay | Admitting: Gynecology

## 2019-02-15 VITALS — Ht 67.0 in | Wt 168.0 lb

## 2019-02-15 DIAGNOSIS — G43009 Migraine without aura, not intractable, without status migrainosus: Secondary | ICD-10-CM

## 2019-02-15 DIAGNOSIS — G43109 Migraine with aura, not intractable, without status migrainosus: Secondary | ICD-10-CM

## 2019-02-15 MED ORDER — PROPRANOLOL HCL ER 80 MG PO CP24: 80.0000 mg | ORAL_CAPSULE | Freq: Every day | ORAL | 8 refills | Status: DC

## 2019-02-15 MED FILL — PROPRANOLOL HCL ER 80 MG CP: 80 | 30 days supply | Qty: 30 | Fill #0

## 2019-02-18 ENCOUNTER — Encounter: Payer: Self-pay | Admitting: Women's Health

## 2019-02-18 ENCOUNTER — Encounter: Payer: Self-pay | Admitting: Family Medicine

## 2019-02-22 DIAGNOSIS — M545 Low back pain: Secondary | ICD-10-CM | POA: Diagnosis not present

## 2019-02-22 MED FILL — tiZANidine HCL 2 MG TABS: 2 | 8 days supply | Qty: 60 | Fill #1

## 2019-02-23 ENCOUNTER — Ambulatory Visit: Payer: 59 | Admitting: Obstetrics & Gynecology

## 2019-02-23 ENCOUNTER — Other Ambulatory Visit: Payer: 59

## 2019-02-24 ENCOUNTER — Other Ambulatory Visit: Payer: Self-pay

## 2019-02-25 ENCOUNTER — Ambulatory Visit (INDEPENDENT_AMBULATORY_CARE_PROVIDER_SITE_OTHER): Payer: 59

## 2019-02-25 ENCOUNTER — Other Ambulatory Visit: Payer: Self-pay | Admitting: Obstetrics & Gynecology

## 2019-02-25 ENCOUNTER — Ambulatory Visit: Payer: 59 | Admitting: Obstetrics & Gynecology

## 2019-02-25 DIAGNOSIS — Z9889 Other specified postprocedural states: Secondary | ICD-10-CM

## 2019-02-25 DIAGNOSIS — N857 Hematometra: Secondary | ICD-10-CM

## 2019-02-25 DIAGNOSIS — R102 Pelvic and perineal pain: Secondary | ICD-10-CM

## 2019-02-25 DIAGNOSIS — M545 Low back pain: Secondary | ICD-10-CM | POA: Diagnosis not present

## 2019-02-25 DIAGNOSIS — N803 Endometriosis of pelvic peritoneum: Secondary | ICD-10-CM

## 2019-02-25 DIAGNOSIS — IMO0002 Reserved for concepts with insufficient information to code with codable children: Secondary | ICD-10-CM

## 2019-02-25 MED ORDER — NORETHINDRONE 0.35 MG PO TABS: 1.0000 | ORAL_TABLET | Freq: Every day | ORAL | 4 refills | Status: DC

## 2019-02-25 MED FILL — NORETHINDRONE 0.35 MG TABS: 0.35 | 28 days supply | Qty: 28 | Fill #0

## 2019-02-25 NOTE — Progress Notes (Signed)
    MANOLA BRUMFIELD Jul 10, 1985 [id number]        33 y.o.  G3P3L4 Married.  S/P TL.  Nurse.  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  Severe monthly pelvic pains/cramps x about 5 days with persistent lower intensity pain continuously.  Deep pain with IC.  Pelvic US suggestive of Hematometra 01/25/2019.  H/O Endometriosis, stopped [name] because of hallucinations at night.   OB History  Gravida Para Term Preterm AB Living  3 3       4   SAB TAB Ectopic Multiple Live Births        1 4    # Outcome Date GA Lbr Len/2nd Weight Sex Delivery Anes PTL Lv  3 Para 01/20/09 [redacted]w[redacted]d    CS-Classical   LIV  2A Para 09/28/05 [redacted]w[redacted]d    Vag-Spont   LIV     Birth Comments: twins  2B Para 09/28/05 [redacted]w[redacted]d    CS-Classical   LIV  1 Para 07/03/04 [redacted]w[redacted]d    Vag-Spont   LIV    Past medical history,surgical history, problem list, medications, allergies, family history and social history were all reviewed and documented in the EPIC chart.   Directed ROS with pertinent positives and negatives documented in the history of present illness/assessment and plan.  Exam:  There were no vitals filed for this visit. General appearance:  Normal  Pelvic US today: T/V images.  Anteverted uterus normal in size and shape with no myometrial mass, the uterus measures 7.72 x 4.04 x 3.63 cm.  Irregular fluid collection measuring 5.5 cm in left cornual area extending into the proximal aspect of the left tube.  Part of the fluid collection shows debris measuring 2.1 x 1 cm.  The endometrium is irregular, but not thickened, probably secondary to endometrial ablation.  Both ovaries are normal in size with normal follicular pattern.  A dominant follicle is present on the left ovary measuring 2.7 cm.  No adnexal mass.  No free fluid in the posterior cul-de-sac.   Assessment/Plan:  33 y.o. G3P3   1. Pelvic pain in female Progressive  increase in pelvic pain with severe dysmenorrhea and dyspareunia.  Probably associated with known increase in endometriosis and hematometra secondary to endometrial ablation.  Pelvic ultrasound findings reviewed with patient confirming hematometra with a normal uterus otherwise and normal ovaries bilaterally.  Decision to start on the progestin only pill to decrease the menstrual flow which hopefully will help decrease the hematometra and might also stabilize the symptoms of endometriosis.  Will also start on Depo-Lupron for 6 months as soon as insurance coverage is obtained.  Usage, benefits and risks of those treatment approaches were discussed with patient who voiced understanding and agreement with plan.  Depo-Provera was also discussed but patient has not had good results in the past and preferred not to start back on it.  If medical treatment does not control patient's pains to a satisfactory level, will proceed with total hysterectomy.  2. Endometriosis of pelvis Will start on the Progestin-only pill.  DepoLupron injection when cleared from insurance.  3. S/P endometrial ablation Probably causing the endometrium metrorrhagia.  4. Hematometra Confirmed by pelvic ultrasound today  Other orders - norethindrone (MICRONOR) 0.35 MG tablet; Take 1 tablet (0.35 mg total) by mouth daily.  Counseling on above issues and coordination of care more than 50% for 25 minutes.  [name] MD, 9:39 AM 02/25/2019

## 2019-02-26 ENCOUNTER — Encounter: Payer: Self-pay | Admitting: Family Medicine

## 2019-02-26 ENCOUNTER — Telehealth: Payer: Self-pay

## 2019-02-26 ENCOUNTER — Encounter: Payer: Self-pay | Admitting: Obstetrics & Gynecology

## 2019-02-26 MED ORDER — METAXALONE 800 MG PO TABS: 800.0000 mg | ORAL_TABLET | Freq: Three times a day (TID) | ORAL | 1 refills | Status: DC | PRN

## 2019-02-26 MED FILL — METAXALONE 800 MG TABS: 800 | 30 days supply | Qty: 90 | Fill #0

## 2019-02-26 NOTE — Telephone Encounter (Signed)
I called patient to let her know Dr. Marguerita Merles had sent me an order for Lupron for her but it is currently unavailable with no definite date of when it might be available again.  I told her I will hold her order and as soon as I find out it is available I will call her and let her know and start the order process.

## 2019-02-26 NOTE — Patient Instructions (Signed)
1. Pelvic pain in female Progressive increase in pelvic pain with severe dysmenorrhea and dyspareunia.  Probably associated with known increase in endometriosis and hematometra secondary to endometrial ablation.  Pelvic ultrasound findings reviewed with patient confirming hematometra with a normal uterus otherwise and normal ovaries bilaterally.  Decision to start on the progestin only pill to decrease the menstrual flow which hopefully will help decrease the hematometra and might also stabilize the symptoms of endometriosis.  Will also start on Depo-Lupron for 6 months as soon as insurance coverage is obtained.  Usage, benefits and risks of those treatment approaches were discussed with patient who voiced understanding and agreement with plan.  Depo-Provera was also discussed but patient has not had good results in the past and preferred not to start back on it.  If medical treatment does not control patient's pains to a satisfactory level, will proceed with total hysterectomy.  2. Endometriosis of pelvis Will start on the Progestin-only pill.  DepoLupron injection when cleared from insurance.  3. S/P endometrial ablation Probably causing the endometrium metrorrhagia.  4. Hematometra Confirmed by pelvic ultrasound today  Other orders - norethindrone (MICRONOR) 0.35 MG tablet; Take 1 tablet (0.35 mg total) by mouth daily.  Penny Thomas, it was a pleasure seeing you today!

## 2019-03-01 ENCOUNTER — Encounter: Payer: Self-pay | Admitting: Women's Health

## 2019-03-01 ENCOUNTER — Other Ambulatory Visit: Payer: Self-pay | Admitting: Women's Health

## 2019-03-01 MED ORDER — TRAMADOL HCL 50 MG PO TABS: 50.0000 mg | ORAL_TABLET | Freq: Four times a day (QID) | ORAL | 0 refills | Status: DC | PRN

## 2019-03-01 NOTE — Telephone Encounter (Signed)
Earlier message routed to Dr. Marguerita Merles but she is off today.

## 2019-03-06 DIAGNOSIS — Z23 Encounter for immunization: Secondary | ICD-10-CM | POA: Diagnosis not present

## 2019-03-09 DIAGNOSIS — M545 Low back pain: Secondary | ICD-10-CM | POA: Diagnosis not present

## 2019-03-19 MED FILL — NORETHINDRONE 0.35 MG TABS: 0.35 | 28 days supply | Qty: 28 | Fill #1

## 2019-03-22 MED FILL — PROPRANOLOL HCL ER 80 MG CP: 80 | 30 days supply | Qty: 30 | Fill #1

## 2019-04-21 MED FILL — NORETHINDRONE 0.35 MG TABS: 0.35 | 28 days supply | Qty: 28 | Fill #2

## 2019-04-21 MED FILL — PROPRANOLOL HCL ER 80 MG CP: 80 | 30 days supply | Qty: 30 | Fill #2

## 2019-04-25 ENCOUNTER — Emergency Department (HOSPITAL_COMMUNITY)
Admission: EM | Admit: 2019-04-25 | Discharge: 2019-04-25 | Disposition: A | Discharge status: Home / Self Care | Payer: 59 | Attending: Emergency Medicine | Admitting: Emergency Medicine

## 2019-04-25 ENCOUNTER — Encounter (HOSPITAL_COMMUNITY): Payer: Self-pay | Admitting: Emergency Medicine

## 2019-04-25 ENCOUNTER — Other Ambulatory Visit: Payer: Self-pay

## 2019-04-25 DIAGNOSIS — J45909 Unspecified asthma, uncomplicated: Secondary | ICD-10-CM | POA: Diagnosis not present

## 2019-04-25 DIAGNOSIS — M5431 Sciatica, right side: Secondary | ICD-10-CM | POA: Insufficient documentation

## 2019-04-25 DIAGNOSIS — M5441 Lumbago with sciatica, right side: Secondary | ICD-10-CM | POA: Diagnosis not present

## 2019-04-25 DIAGNOSIS — M545 Low back pain: Secondary | ICD-10-CM | POA: Diagnosis present

## 2019-04-25 DIAGNOSIS — Z79899 Other long term (current) drug therapy: Secondary | ICD-10-CM | POA: Insufficient documentation

## 2019-04-25 MED ORDER — MELOXICAM 7.5 MG PO TABS: 7.5000 mg | ORAL_TABLET | Freq: Every day | ORAL | 0 refills | Status: AC

## 2019-04-25 MED ORDER — KETOROLAC TROMETHAMINE 30 MG/ML IJ SOLN: 30.0000 mg | Freq: Once | INTRAMUSCULAR | Status: DC

## 2019-04-25 MED ORDER — KETOROLAC TROMETHAMINE 60 MG/2ML IM SOLN
30.0000 mg | Freq: Once | INTRAMUSCULAR | Status: AC
  Administered 2019-04-25: 30 mg via INTRAMUSCULAR
  Filled 2019-04-25: qty 2

## 2019-04-25 MED ORDER — LIDOCAINE 5 % EX PTCH
1.0000 | MEDICATED_PATCH | CUTANEOUS | Status: DC
  Administered 2019-04-25: 1 via TRANSDERMAL
  Filled 2019-04-25: qty 1

## 2019-04-25 NOTE — ED Provider Notes (Signed)
[facility] EMERGENCY DEPARTMENT Provider Note   CSN: [id number] Arrival date & time: 04/25/19  [id number]     History   Chief Complaint Chief Complaint  Patient presents with  . Sciatica    HPI Penny Thomas is a 33 y.o. female.     33 year old female presents with complaint of right low back pain. Patient states that she has had this pain for several months, has been to a chiropractor and through physical therapy, is currently taking Skelaxin or tizanidine, Ultram, gabapentin without improvement in her pain. Patient states pain has been present for the past few days, not improving with rest or above medications, does not radiate, is worse with any movement. Denies IV drug use, fevers, abdominal pain, groin numbness, leg weakness or loss of bowel or bladder control. No other complaints or concerns. Denies any falls or trauma.     Past Medical History:  Diagnosis Date  . Allergy   . Anemia    takes iron supplement  . Asthma   . Asthma    prn inhaler  . Blood transfusion 2007  . Blood transfusion 2010  . Blood transfusion without reported diagnosis   . 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 Hemorrhage in uterus 01-20-09  . Migraines   . 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 Seasonal allergies   . Sickle cell anemia (HCC)    trait  . Sickle cell trait [facility])     Patient Active Problem List   Diagnosis Date Noted  . History of gestational diabetes 12/02/2018  . Prediabetes 12/02/2018  . Mild persistent asthma without complication [id number]  . Acute nasopharyngitis 12/11/2016  . Intractable chronic migraine without aura and without status migrainosus 06/23/2015  . Migraines 03/23/2015  . Right-sided low back pain without sciatica 03/23/2015  . Muscle cramps 03/23/2015  . Dysmenorrhea 04/08/2013  . Endometriosis 04/08/2013    Past Surgical History:  Procedure Laterality Date  . CESAREAN SECTION  09-28-05  . CESAREAN SECTION  01-20-09  .  CESAREAN SECTION    . ENDOMETRIAL ABLATION  10/2011   HEROPTIOIN  . KNEE ARTHROSCOPY Right 02/16/2014   Procedure: RIGHT ARTHROSCOPY KNEE;  Surgeon: Kerin Salen, MD;  Location: [place];  Service: Orthopedics;  Laterality: Right;  . POLYP REMOVAL  5-08   ?CERVICAL OR UTERINE POLYP  . TUBAL LIGATION  01-20-09   DONE WITH C-SECTION  . WISDOM TOOTH EXTRACTION Right 2014     OB History    Gravida  3   Para  3   Term      Preterm      AB      Living  4     SAB      TAB      Ectopic      Multiple  1   Live Births  4            Home Medications    Prior to Admission medications   Medication Sig Start Date End Date Taking? Authorizing Provider  albuterol (PROVENTIL HFA;VENTOLIN HFA) 108 (90 Base) MCG/ACT inhaler Inhale 1-2 puffs into the lungs every 6 (six) hours as needed for wheezing or shortness of breath. 08/07/17   Kennith Gain, MD  Biotin 1000 MCG tablet Take 2,000 mcg by mouth 3 (three) times daily.    [provider]  budesonide-formoterol (SYMBICORT) 160-4.5 MCG/ACT inhaler Inhale 2 puffs into the lungs 2 (two) times daily. 08/07/17   Prudy Feeler  Mardene Celeste, MD  cetirizine (ZYRTEC) 10 MG tablet Take 1 tablet (10 mg total) by mouth daily. 08/07/17   Kennith Gain, MD  Lasmiditan Succinate (REYVOW) 100 MG TABS Take 1 tablet by mouth daily as needed (No more than 1 tab in 24 h). 08/27/18   Tomi Likens, Adam R, DO  meloxicam (MOBIC) 7.5 MG tablet Take 1 tablet (7.5 mg total) by mouth daily for 10 days. 04/25/19 05/05/19  Tacy Learn, PA-C  metaxalone (SKELAXIN) 800 MG tablet Take 1 tablet (800 mg total) by mouth 3 (three) times daily as needed for muscle spasms. 02/26/19   Hilts, Legrand Como, MD  methocarbamol (ROBAXIN) 500 MG tablet Take 1 tablet (500 mg total) by mouth at bedtime as needed for muscle spasms. 12/02/18   Henson, Vickie L, NP-C  norethindrone (MICRONOR) 0.35 MG tablet Take 1 tablet (0.35 mg total) by mouth  daily. 02/25/19   [name], MD  olopatadine (PATANOL) 0.1 % ophthalmic solution Place 1 drop into both eyes 2 (two) times daily as needed for allergies. 08/07/17   Kennith Gain, MD  propranolol ER (INDERAL LA) 80 MG 24 hr capsule Take 1 capsule (80 mg total) by mouth daily. 02/15/19   Pieter Partridge, DO  tiZANidine (ZANAFLEX) 2 MG tablet Take 1-2 tablets (2-4 mg total) by mouth every 6 (six) hours as needed for muscle spasms. 12/21/18   Hilts, Legrand Como, MD  traMADol (ULTRAM) 50 MG tablet Take 1 tablet (50 mg total) by mouth every 6 (six) hours as needed. 03/01/19   Huel Cote, NP    Family History Family History  Problem Relation Age of Onset  . Fibromyalgia Mother   . Multiple sclerosis Mother   . Asthma Father   . Healthy Maternal Grandmother   . Healthy Maternal Grandfather   . Healthy Paternal Grandmother   . Healthy Paternal Grandfather   . Endometriosis Sister   . Healthy Brother   . Healthy Child     Social History Social History   Tobacco Use  . Smoking status: Never Smoker  . Smokeless tobacco: Never Used  Substance Use Topics  . Alcohol use: No    Alcohol/week: 0.0 standard drinks    Comment: seldom  . Drug use: No     Allergies   Patient has no known allergies.   Review of Systems Review of Systems  Constitutional: Negative for chills and fever.  Gastrointestinal: Negative for abdominal pain.  Genitourinary: Negative for difficulty urinating.  Musculoskeletal: Positive for back pain. Negative for gait problem.  Skin: Negative for rash and wound.  Allergic/Immunologic: Negative for immunocompromised state.  Neurological: Negative for weakness and numbness.  Hematological: Does not bruise/bleed easily.  Psychiatric/Behavioral: Negative for confusion.  All other systems reviewed and are negative.    Physical Exam Updated Vital Signs BP 126/77   Pulse 78   Temp 98.1 F (36.7 C)   Resp 17   SpO2 99%   Physical Exam Vitals  signs and nursing note reviewed.  Constitutional:      General: She is not in acute distress.    Appearance: She is well-developed. She is not diaphoretic.  HENT:     Head: Normocephalic and atraumatic.  Pulmonary:     Effort: Pulmonary effort is normal.  Abdominal:     Palpations: Abdomen is soft.     Tenderness: There is no abdominal tenderness.  Musculoskeletal:        General: Tenderness present.       Back:  Right lower leg: No edema.     Left lower leg: No edema.  Skin:    General: Skin is warm and dry.     Findings: No erythema or rash.  Neurological:     Mental Status: She is alert and oriented to person, place, and time.     Sensory: No sensory deficit.     Motor: No weakness.     Gait: Gait normal.     Deep Tendon Reflexes: Reflexes normal.  Psychiatric:        Behavior: Behavior normal.      ED Treatments / Results  Labs (all labs ordered are listed, but only abnormal results are displayed) Labs Reviewed - No data to display  EKG None  Radiology No results found.  Procedures Procedures (including critical care time)  Medications Ordered in ED Medications  ketorolac (TORADOL) 30 MG/ML injection 30 mg (has no administration in time range)  lidocaine (LIDODERM) 5 % 1 patch (has no administration in time range)     Initial Impression / Assessment and Plan / ED Course  I have reviewed the triage vital signs and the nursing notes.  Pertinent labs & imaging results that were available during my care of the patient were reviewed by me and considered in my medical decision making (see chart for details).  Clinical Course as of Apr 24 1013  Sun Nov 29, 676  2762 33 year old female with history of sciatica presents with complaint of right lower back pain without fall or injury, not improving with her medications. On exam patient has tenderness along her right paraspinous lumbar spine into her right SI area, no midline or bony tenderness. Pain is worse  with extension of right leg or rotation of right hip with flexion. Sensation intact, pulses present normal, reflexes symmetric and normal, leg strength equal. Patient will be given Lidoderm patch and injection of Toradol, recommend start anti-inflammatory, given prescription for meloxicam. Patient was advised to take one of her muscle relaxers either the Skelaxin or the tizanidine but not both. She can also take Tylenol. Recommend warm compresses and gentle stretching. Patient follow-up with PCP next week.   [LM]    Clinical Course User Index [LM] Tacy Learn, PA-C      Final Clinical Impressions(s) / ED Diagnoses   Final diagnoses:  Right sided sciatica    ED Discharge Orders         Ordered    meloxicam (MOBIC) 7.5 MG tablet  Daily     04/25/19 1010           Roque Lias 04/25/19 [place], DO 04/25/19 1056

## 2019-04-25 NOTE — Discharge Instructions (Addendum)
Apply heating pad for 30 minutes at a time 3 times daily. Gentle stretching as discussed following heating pad. Take meloxicam daily. Take either Skelaxin or tizanidine as prescribed. You can also take Tylenol as directed. You can apply topical pain patches to area, these are available at the pharmacy. Follow-up with your primary care provider.

## 2019-04-25 NOTE — ED Notes (Signed)
Pt discharge instructions and prescriptions reviewed with the patient. Pt verbalized understanding of instructions. Pt discharged.

## 2019-04-25 NOTE — ED Triage Notes (Signed)
Pt here for uncontrolled pain related to her sciatica not relieved by her prescribed meds. Pt has pain to the right hip/buttocks.

## 2019-04-27 ENCOUNTER — Other Ambulatory Visit: Payer: Self-pay

## 2019-04-27 ENCOUNTER — Ambulatory Visit: Payer: Self-pay

## 2019-04-27 ENCOUNTER — Ambulatory Visit: Payer: 59 | Admitting: Family Medicine

## 2019-04-27 ENCOUNTER — Encounter: Payer: Self-pay | Admitting: Family Medicine

## 2019-04-27 DIAGNOSIS — M533 Sacrococcygeal disorders, not elsewhere classified: Secondary | ICD-10-CM

## 2019-04-27 MED ORDER — TRAMADOL HCL 50 MG PO TABS: 50.0000 mg | ORAL_TABLET | Freq: Four times a day (QID) | ORAL | 0 refills | Status: DC | PRN

## 2019-04-27 MED FILL — traMADol HCL 50 MG TABS: 50 | 7 days supply | Qty: 30 | Fill #0

## 2019-04-27 NOTE — Progress Notes (Signed)
[name] - 33 y.o. female MRN [id number]  Date of birth: 01/02/1986  Office Visit Note: Visit Date: 04/27/2019 PCP: Girtha Rm, NP-C Referred by: Girtha Rm, NP-C  Subjective: Chief Complaint  Patient presents with  . Lower Back - Pain    Persistent lower back pain. Right now, the pain is in the center of her lower back. ER on 11/29, because the pain had gotten so bad. Toradol injection & lidocaine patch helped until they wore off. Pain was not as severe afterward. Heat gives most relief.   HPI: Penny Thomas is a 33 y.o. female who comes in today with persistent left lower back pain. She was last seen in our office in July for similar problem. She reports that her back pain improved slightly when she was in PT in October and they were using TENs unit and heat on back. Prior to that, she went to chiropractor which helped briefly as well.  Since late October, she has had worsening pain that has not responded to skelaxin, robaxin, tizanidine, Meloxicam. Tramadol helps the pain some. On Friday 11/27, pain was so severe that she could not move or roll over in bed without pain. She presented to the ED, had Toradol injection and lidocaine patch that helped pain. After it wore off, her pain was still present but not as severe. Denies numbness/tingling down right or left leg. No shooting pain.    ROS Otherwise per HPI.  Assessment & Plan: Visit Diagnoses:  1. Sacroiliac joint dysfunction of right side     Plan: Corticosteroid injection of SI joint provided relief of pain, able to ambulate and bear weight on right leg better than prior to injection. Provided tramadol for relief when anesthetic effect wears off. If pain does not improve, will obtain lumbar MRI.   Meds & Orders:  Meds ordered this encounter  Medications  . traMADol (ULTRAM) 50 MG tablet    Sig: Take 1 tablet (50 mg total) by mouth every 6 (six) hours as needed.    Dispense:  30 tablet   Refill:  0    Orders Placed This Encounter  Procedures  . [facility] [place] - NO CHARGES    Follow-up: No follow-ups on file.   Procedures: Right SI joint ultrasound-guided intraarticular injection:  After sterile prep with betadine, injected 8 cc 1 % lido and 40 mg methylprednisolone with a 22 gauge spinal needle, passing it from medial to lateral at area of sacroiliac joint. Injectate seen filling area.  Very good immediate relief.  Clinical History: No specialty comments available.   She reports that she has never smoked. She has never used smokeless tobacco.  Recent Labs    07/14/18 1618 12/02/18 1406  HGBA1C 5.6 6.1*    Objective:  VS:  HT:    WT:   BMI:     BP:   HR: bpm  TEMP: ( )  RESP:  Physical Exam  PHYSICAL EXAM: Gen: NAD, alert, cooperative with exam, well-appearing HEENT: clear conjunctiva,  CV:  no edema, capillary refill brisk, normal rate Resp: non-labored Skin: no rashes, normal turgor  Neuro: no gross deficits.  Psych:  alert and oriented  Ortho Exam  Lumbar spine: - Inspection: no gross deformity or asymmetry, swelling or ecchymosis - Palpation: No TTP over the spinous processes. Tight lumbar paraspinal muscles. Maximal point of tenderness over right SI joint - ROM: full active ROM of the lumbar spine in flexion and extension- pain in SI joint with  extension - Strength: 5/5 strength of lower extremity in L4-S1 nerve root distributions b/l - Neuro: sensation intact in the L4-S1 nerve root distribution b/l, 2+ L4 and S1 reflexes - Special testing: positive Stork test right side, positive FABER right side  Imaging: [facility] [place] - No Charges  Result Date: 04/27/2019 Please see Notes tab for imaging impression.   Past Medical/Family/Surgical/Social History: Medications & Allergies reviewed per EMR, new medications updated. Patient Active Problem List   Diagnosis Date Noted  . History of gestational diabetes 12/02/2018  . Prediabetes 12/02/2018  . Mild  persistent asthma without complication [id number]  . Acute nasopharyngitis 12/11/2016  . Intractable chronic migraine without aura and without status migrainosus 06/23/2015  . Migraines 03/23/2015  . Right-sided low back pain without sciatica 03/23/2015  . Muscle cramps 03/23/2015  . Dysmenorrhea 04/08/2013  . Endometriosis 04/08/2013   Past Medical History:  Diagnosis Date  . Allergy   . Anemia    takes iron supplement  . Asthma   . Asthma    prn inhaler  . Blood transfusion 2007  . Blood transfusion 2010  . Blood transfusion without reported diagnosis   . 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 Hemorrhage in uterus 01-20-09  . Migraines   . 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 Seasonal allergies   . Sickle cell anemia (HCC)    trait  . Sickle cell trait (HCC)    Family History  Problem Relation Age of Onset  . Fibromyalgia Mother   . Multiple sclerosis Mother   . Asthma Father   . Healthy Maternal Grandmother   . Healthy Maternal Grandfather   . Healthy Paternal Grandmother   . Healthy Paternal Grandfather   . Endometriosis Sister   . Healthy Brother   . Healthy Child    Past Surgical History:  Procedure Laterality Date  . CESAREAN SECTION  09-28-05  . CESAREAN SECTION  01-20-09  . CESAREAN SECTION    . ENDOMETRIAL ABLATION  10/2011   HEROPTIOIN  . KNEE ARTHROSCOPY Right 02/16/2014   Procedure: RIGHT ARTHROSCOPY KNEE;  Surgeon: Kerin Salen, MD;  Location: [place];  Service: Orthopedics;  Laterality: Right;  . POLYP REMOVAL  5-08   ?CERVICAL OR UTERINE POLYP  . TUBAL LIGATION  01-20-09   DONE WITH C-SECTION  . WISDOM TOOTH EXTRACTION Right 2014   Social History   Occupational History  . Occupation: [employer]  Tobacco Use  . Smoking status: Never Smoker  . Smokeless tobacco: Never Used  Substance and Sexual Activity  . Alcohol use: No    Alcohol/week: 0.0 standard drinks    Comment: seldom  . Drug use: No  . Sexual activity: Yes     Birth control/protection: Surgical, None    Comment: TUBAL LIGATION, INTERCOURSE AGE 1, SEXUAL PARTNERS 5

## 2019-04-27 NOTE — Progress Notes (Signed)
I saw and examined the patient with Dr. Mayer Masker and agree with assessment and plan as outlined.    Right-sided SI-area pain. Will inject the area with cortisone today.  Lumbar MRI if fails to improve.

## 2019-04-30 ENCOUNTER — Encounter: Payer: Self-pay | Admitting: Family Medicine

## 2019-04-30 DIAGNOSIS — M5442 Lumbago with sciatica, left side: Secondary | ICD-10-CM

## 2019-04-30 NOTE — Addendum Note (Signed)
Addended by: Hortencia Pilar on: 04/30/2019 12:58 PM   Modules accepted: Orders

## 2019-05-19 ENCOUNTER — Other Ambulatory Visit: Payer: Self-pay | Admitting: Family Medicine

## 2019-05-19 MED ORDER — TRAMADOL HCL 50 MG PO TABS: 50.0000 mg | ORAL_TABLET | Freq: Four times a day (QID) | ORAL | 0 refills | Status: DC | PRN

## 2019-05-19 MED FILL — NORETHINDRONE 0.35 MG TABS: 0.35 | 28 days supply | Qty: 28 | Fill #3

## 2019-05-19 MED FILL — traMADol HCL 50 MG TABS: 50 | 7 days supply | Qty: 30 | Fill #0

## 2019-05-19 MED FILL — PROPRANOLOL HCL ER 80 MG CP: 80 | 30 days supply | Qty: 30 | Fill #3

## 2019-05-19 NOTE — Telephone Encounter (Signed)
Please advise 

## 2019-05-24 ENCOUNTER — Ambulatory Visit
Admission: RE | Admit: 2019-05-24 | Discharge: 2019-05-24 | Disposition: A | Discharge status: Home / Self Care | Payer: 59 | Source: Ambulatory Visit | Attending: Family Medicine | Admitting: Family Medicine

## 2019-05-24 DIAGNOSIS — M5442 Lumbago with sciatica, left side: Secondary | ICD-10-CM

## 2019-05-24 DIAGNOSIS — M48061 Spinal stenosis, lumbar region without neurogenic claudication: Secondary | ICD-10-CM | POA: Diagnosis not present

## 2019-05-25 ENCOUNTER — Telehealth: Payer: Self-pay | Admitting: Family Medicine

## 2019-05-25 DIAGNOSIS — M5442 Lumbago with sciatica, left side: Secondary | ICD-10-CM

## 2019-05-25 NOTE — Addendum Note (Signed)
Addended by: Hortencia Pilar on: 05/25/2019 11:28 AM   Modules accepted: Orders

## 2019-05-25 NOTE — Telephone Encounter (Signed)
Lumbar MRI scan shows a disc protrusion at L5-S1 which is near both of the S1 nerve roots but not compressing them.  This could be the source of pain, however.  Remainder of MRI scan looks normal.  Treatment options would include a referral for a lumbar epidural steroid injection, or possibly resumption of physical therapy, or else surgical consultation.

## 2019-06-14 ENCOUNTER — Other Ambulatory Visit: Payer: Self-pay

## 2019-06-14 ENCOUNTER — Ambulatory Visit: Payer: 59 | Admitting: Physical Medicine and Rehabilitation

## 2019-06-14 ENCOUNTER — Encounter: Payer: Self-pay | Admitting: Physical Medicine and Rehabilitation

## 2019-06-14 ENCOUNTER — Ambulatory Visit: Payer: Self-pay

## 2019-06-14 VITALS — BP 125/77 | HR 76

## 2019-06-14 DIAGNOSIS — M5116 Intervertebral disc disorders with radiculopathy, lumbar region: Secondary | ICD-10-CM | POA: Diagnosis not present

## 2019-06-14 MED ORDER — METHYLPREDNISOLONE ACETATE 80 MG/ML IJ SUSP
40.0000 mg | Freq: Once | INTRAMUSCULAR | Status: AC
  Administered 2019-06-14: 15:00:00 40 mg

## 2019-06-14 NOTE — Progress Notes (Signed)
   Numeric Pain Rating Scale and Functional Assessment Average Pain (9)   In the last MONTH (on 0-10 scale) has pain interfered with the following?  1. General activity like being  able to carry out your everyday physical activities such as walking, climbing stairs, carrying groceries, or moving a chair?  Rating(10)   +Driver, -BT, -Dye Allergies.

## 2019-06-15 NOTE — Procedures (Signed)
Lumbar Epidural Steroid Injection - Interlaminar Approach with Fluoroscopic Guidance  Patient: Penny Thomas      Date of Birth: Feb 28, 1986 MRN: [id number] PCP: Girtha Rm, NP-C      Visit Date: 06/14/2019   Universal Protocol:     Consent Given By: the patient  Position: PRONE  Additional Comments: Vital signs were monitored before and after the procedure. Patient was prepped and draped in the usual sterile fashion. The correct patient, procedure, and site was verified.   Injection Procedure Details:  Procedure Site One Meds Administered:  Meds ordered this encounter  Medications  . methylPREDNISolone acetate (DEPO-MEDROL) injection 40 mg     Laterality: Right  Location/Site:  L5-S1  Needle size: 20 G  Needle type: Tuohy  Needle Placement: Paramedian epidural  Findings:   -Comments: Excellent flow of contrast into the epidural space.  Procedure Details: Using a paramedian approach from the side mentioned above, the region overlying the inferior lamina was localized under fluoroscopic visualization and the soft tissues overlying this structure were infiltrated with 4 ml. of 1% Lidocaine without Epinephrine. The Tuohy needle was inserted into the epidural space using a paramedian approach.   The epidural space was localized using loss of resistance along with lateral and bi-planar fluoroscopic views.  After negative aspirate for air, blood, and CSF, a 2 ml. volume of Isovue-250 was injected into the epidural space and the flow of contrast was observed. Radiographs were obtained for documentation purposes.    The injectate was administered into the level noted above.   Additional Comments:  The patient tolerated the procedure well Dressing: 2 x 2 sterile gauze and Band-Aid    Post-procedure details: Patient was observed during the procedure. Post-procedure instructions were reviewed.  Patient left the clinic in stable condition.

## 2019-06-15 NOTE — Progress Notes (Signed)
Penny Thomas - 34 y.o. female MRN [id number]  Date of birth: [birth year]  Office Visit Note: Visit Date: 06/14/2019 PCP: Girtha Rm, NP-C Referred by: Girtha Rm, NP-C  Subjective: Chief Complaint  Patient presents with  . Lower Back - Pain   HPI:  Penny Thomas is a 34 y.o. female who comes in today For planned right L5-S1 interlaminar epidural steroid injection at the request of Dr. Legrand Como Hilts.  She has been having right more than left low back pain and hip pain.  She has MRI showing disc protrusion at L5-S1.  MRI reviewed with the patient.  Injection will be diagnostic hopefully therapeutic.  She has failed conservative care otherwise.  ROS Otherwise per HPI.  Assessment & Plan: Visit Diagnoses:  1. Radiculopathy due to lumbar intervertebral disc disorder     Plan: No additional findings.   Meds & Orders:  Meds ordered this encounter  Medications  . methylPREDNISolone acetate (DEPO-MEDROL) injection 40 mg    Orders Placed This Encounter  Procedures  . XR C-ARM NO REPORT  . Epidural Steroid injection    Follow-up: Return for visit to requesting physician as needed.   Procedures: No procedures performed  Lumbar Epidural Steroid Injection - Interlaminar Approach with Fluoroscopic Guidance  Patient: Penny Thomas      Date of Birth: [birth year] MRN: [id number] PCP: Girtha Rm, NP-C      Visit Date: 06/14/2019   Universal Protocol:     Consent Given By: the patient  Position: PRONE  Additional Comments: Vital signs were monitored before and after the procedure. Patient was prepped and draped in the usual sterile fashion. The correct patient, procedure, and site was verified.   Injection Procedure Details:  Procedure Site One Meds Administered:  Meds ordered this encounter  Medications  . methylPREDNISolone acetate (DEPO-MEDROL) injection 40 mg     Laterality: Right  Location/Site:  L5-S1  Needle size:  20 G  Needle type: Tuohy  Needle Placement: Paramedian epidural  Findings:   -Comments: Excellent flow of contrast into the epidural space.  Procedure Details: Using a paramedian approach from the side mentioned above, the region overlying the inferior lamina was localized under fluoroscopic visualization and the soft tissues overlying this structure were infiltrated with 4 ml. of 1% Lidocaine without Epinephrine. The Tuohy needle was inserted into the epidural space using a paramedian approach.   The epidural space was localized using loss of resistance along with lateral and bi-planar fluoroscopic views.  After negative aspirate for air, blood, and CSF, a 2 ml. volume of Isovue-250 was injected into the epidural space and the flow of contrast was observed. Radiographs were obtained for documentation purposes.    The injectate was administered into the level noted above.   Additional Comments:  The patient tolerated the procedure well Dressing: 2 x 2 sterile gauze and Band-Aid    Post-procedure details: Patient was observed during the procedure. Post-procedure instructions were reviewed.  Patient left the clinic in stable condition.    Clinical History: MRI LUMBAR SPINE WITHOUT CONTRAST  TECHNIQUE: Multiplanar, multisequence MR imaging of the lumbar spine was performed. No intravenous contrast was administered.  COMPARISON:  Prior radiograph from 12/21/2018.  FINDINGS: Segmentation: Standard. Lowest well-formed disc space labeled the L5-S1 level.  Alignment: Physiologic with preservation of the normal lumbar lordosis. No listhesis.  Vertebrae: Vertebral body height maintained without evidence for acute or chronic fracture. Bone marrow signal intensity within normal limits. No discrete or worrisome osseous  lesions. No abnormal marrow edema.  Conus medullaris and cauda equina: Conus extends to the L1 level. Conus medullaris within normal limits. Incidental note  made of a small fatty filum terminale. Nerve roots of the cauda equina otherwise unremarkable.  Paraspinal and other soft tissues: Paraspinous soft tissues within normal limits. Visualized visceral structures are normal.  Disc levels:  L1-2:  Unremarkable.  L2-3:  Unremarkable.  L3-4: Normal interspace. Mild bilateral facet hypertrophy. No stenosis or impingement.  L4-5: Normal interspace. Mild bilateral facet hypertrophy. No stenosis or impingement.  L5-S1: Disc desiccation. Superimposed shallow central disc protrusion with associated annular fissure. Protruding disc closely approximates both of the descending S1 nerve roots as they course through the lateral recesses without frank impingement or displacement. No significant spinal stenosis. Foramina remain patent.  IMPRESSION: 1. Shallow central disc protrusion at L5-S1, closely approximating and potentially irritating either of the descending S1 nerve roots. No frank impingement. 2. Otherwise essentially normal MRI of the lumbar spine.   Electronically Signed   By: Jeannine Boga M.D.   On: 05/24/2019 23:19     Objective:  VS:  HT:    WT:   BMI:     BP:125/77  HR:76bpm  TEMP: ( )  RESP:  Physical Exam  Ortho Exam Imaging: XR C-ARM NO REPORT  Result Date: 06/14/2019 Please see Notes tab for imaging impression.

## 2019-06-16 ENCOUNTER — Encounter: Payer: Self-pay | Admitting: Family Medicine

## 2019-06-16 ENCOUNTER — Ambulatory Visit: Payer: 59 | Admitting: Family Medicine

## 2019-06-16 ENCOUNTER — Other Ambulatory Visit: Payer: Self-pay

## 2019-06-16 ENCOUNTER — Ambulatory Visit
Admission: RE | Admit: 2019-06-16 | Discharge: 2019-06-16 | Disposition: A | Discharge status: Home / Self Care | Payer: 59 | Source: Ambulatory Visit | Attending: Family Medicine | Admitting: Family Medicine

## 2019-06-16 VITALS — Wt 168.0 lb

## 2019-06-16 DIAGNOSIS — F40241 Acrophobia: Secondary | ICD-10-CM

## 2019-06-16 DIAGNOSIS — F418 Other specified anxiety disorders: Secondary | ICD-10-CM | POA: Diagnosis not present

## 2019-06-16 DIAGNOSIS — F410 Panic disorder [episodic paroxysmal anxiety] without agoraphobia: Secondary | ICD-10-CM | POA: Diagnosis not present

## 2019-06-16 DIAGNOSIS — F40242 Fear of bridges: Secondary | ICD-10-CM

## 2019-06-16 DIAGNOSIS — M79675 Pain in left toe(s): Secondary | ICD-10-CM

## 2019-06-16 DIAGNOSIS — M7989 Other specified soft tissue disorders: Secondary | ICD-10-CM | POA: Diagnosis not present

## 2019-06-16 DIAGNOSIS — M79672 Pain in left foot: Secondary | ICD-10-CM | POA: Diagnosis not present

## 2019-06-16 DIAGNOSIS — R2689 Other abnormalities of gait and mobility: Secondary | ICD-10-CM | POA: Diagnosis not present

## 2019-06-16 DIAGNOSIS — R69 Illness, unspecified: Secondary | ICD-10-CM | POA: Diagnosis not present

## 2019-06-16 DIAGNOSIS — F4010 Social phobia, unspecified: Secondary | ICD-10-CM | POA: Insufficient documentation

## 2019-06-16 HISTORY — DX: Fear of bridges: F40.242

## 2019-06-16 HISTORY — DX: Pain in left toe(s): M79.675

## 2019-06-16 HISTORY — DX: Acrophobia: F40.241

## 2019-06-16 MED ORDER — DICLOFENAC SODIUM 75 MG PO TBEC: 75.0000 mg | DELAYED_RELEASE_TABLET | Freq: Two times a day (BID) | ORAL | 0 refills | Status: DC

## 2019-06-16 MED ORDER — SERTRALINE HCL 50 MG PO TABS: 50.0000 mg | ORAL_TABLET | Freq: Every day | ORAL | 1 refills | Status: DC

## 2019-06-16 MED FILL — SERTRALINE HCL 50 MG TABLET: 50 | 30 days supply | Qty: 27 | Fill #0

## 2019-06-16 MED FILL — DICLOFENAC SODIUM 75 MG TAB: 75 | 15 days supply | Qty: 30 | Fill #0

## 2019-06-16 MED FILL — PROPRANOLOL HCL ER 80 MG CP: 80 | 30 days supply | Qty: 30 | Fill #4

## 2019-06-16 MED FILL — NORETHINDRONE 0.35 MG TABS: 0.35 | 28 days supply | Qty: 28 | Fill #4

## 2019-06-16 NOTE — Progress Notes (Signed)
done

## 2019-06-16 NOTE — Patient Instructions (Addendum)
Stop Aleve and switch to diclofenac for foot and toe pain. Take this with food. The sertraline I also prescribed you has the potential to increase your risk of GI upset and bleeding so be aware and only take the diclofenac as needed.  Continue with ice and elevating.   We may need to have you contact Dr. Junius Roads office in case you need a boot.  I will be in touch with your X ray result.   Start with 1/2 tablet of the sertraline for the first week and if you are doing well, increase to the whole tablet week 2.  We will follow up in 2 weeks or sooner if needed.   You can call to schedule your appointment with the counselor. A few offices are listed below for you to call.      [facility] [address] [place], [place] [address] [phone]  [facility]  [place]  (across from [facility])  [facility]  [address] [place], [place], [place] [address]  Phone: [phone]   [place] [address] [place], [place] [address]  Phone: [phone]

## 2019-06-16 NOTE — Progress Notes (Signed)
Subjective:  Documentation for virtual audio and video telecommunications through [place] encounter:  The patient was located at home. 2 patient identifiers used.  The provider was located in the office. The patient did consent to this visit and is aware of possible charges through their insurance for this visit.  The other persons participating in this telemedicine service were her husband.     Patient ID: Penny Thomas, female    DOB: 1986/01/24, 34 y.o.   MRN: [id number]  HPI Chief Complaint  Patient presents with  . broke toe    possible broke toe- thursday or friday of last week, breaking up a dog fight, not sure if they stepped on it. anixety been going on a while but getting worse   Complains of a swollen and painful left great toe for the past 2 days. Pain is sharp, stabbing and keeps her awake. Pain is worse with weight bearing. States she is walking on the side of her foot due to pain of great toe.  States her dogs were fighting and may have stepped on her toe.  Denies any broken skin or bleeding.   States she has been taking Aleve 2 pills 3 times per day. She is elevating and icing.   She also complains of anxiety and panic attacks.  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 States lately she has been having to drive to locations that requires driving over bridges and sometimes bridges over water which causes her to panic.   Reports of fear of heights and bridges as long as she can remember.  Has been worse over the past 2-3 months due to being forced to drive over bridges more often with work.   States her heart starts racing and she gets "jittery and cries".   She has tried to do some desensitization on her own. Has not been in therapy for this but is willing.   Has never taken medication either. Is interested.   Contraception: tubal ligation and ablation,.   Birth control for pain.   Denies fever, chills, abdominal pain, N/V/D.   Reviewed allergies, medications,  past medical, surgical, family, and social history.   Review of Systems Pertinent positives and negatives in the history of present illness.     Objective:   Physical Exam Wt 168 lb (76.2 kg)   BMI 26.31 kg/m   Alert and oriented and in no acute distress. Unable to further examine.       Assessment & Plan:  Great toe pain, left - Plan: diclofenac (VOLTAREN) 75 MG EC tablet, DG Foot Complete Left  Limping - Plan: DG Foot Complete Left  Situational anxiety - Plan: sertraline (ZOLOFT) 50 MG tablet  Panic attacks - Plan: sertraline (ZOLOFT) 50 MG tablet  Fear of heights  Fear of bridges  Discussed limitations of virtual visit. She will try to send me a picture of her foot via My Chart since video did not work on our call today.  No signs of broken skin or infection per patient.  Plan to send her for a left foot XR and switch her from Aleve to diclofenac. Discussed rest, ice, elevation. Consider referring her back to Dr. Junius Roads if needed. She may need a walking boot.  Situational anxiety and panic attacks- plan to start her on sertraline. Discussed taking 1/2 tablet the first week and potential side effects. AVS sent discussing potential risk of GI upset and bleeding with NSAID and SSRI.  Recommend counseling and a list of offices will  be sent to her.  Prefer to avoid benzodiazepines due to short time of panic attacks and for the fact that she is driving when this occurs.  Follow up in 2 weeks virtual ok.   Addendum:  Picture of left great toe shows swelling without deformity.   Time spent on call was 22 minutes and in total for visit 30 minutes.   This virtual service is not related to other E/M service within previous 7 days.

## 2019-06-18 ENCOUNTER — Encounter: Payer: Self-pay | Admitting: Physical Medicine and Rehabilitation

## 2019-06-23 ENCOUNTER — Other Ambulatory Visit: Payer: Self-pay | Admitting: Family Medicine

## 2019-06-23 MED ORDER — TRAMADOL HCL 50 MG PO TABS: 50.0000 mg | ORAL_TABLET | Freq: Four times a day (QID) | ORAL | 0 refills | Status: DC | PRN

## 2019-06-23 MED FILL — traMADol HCL 50 MG TABS: 50 | 7 days supply | Qty: 30 | Fill #0

## 2019-06-23 NOTE — Telephone Encounter (Signed)
Hilts pt 

## 2019-07-05 ENCOUNTER — Other Ambulatory Visit: Payer: Self-pay | Admitting: Women's Health

## 2019-07-05 MED ORDER — NORETHINDRONE ACET-ETHINYL EST 1-20 MG-MCG PO TABS: ORAL_TABLET | ORAL | 1 refills | Status: DC

## 2019-07-05 MED ORDER — NORETHINDRONE ACET-ETHINYL EST 1-20 MG-MCG PO TABS: ORAL_TABLET | ORAL | 6 refills | Status: DC

## 2019-07-05 MED FILL — LARIN 21 1-20 TABLET: 1-20 | 21 days supply | Qty: 28 | Fill #0

## 2019-07-05 NOTE — Telephone Encounter (Signed)
Telephone call, states she is to get cycle end of month and still gets cramping at about the time that cycle would have been monthly.  Currently on Micronor daily.  Non-smoker, health problems include endometriosis.  Had good relief of pain with [name] but began to have hallucinations.  Has had endometrial ablation, amenorrheic.  Options for Pelvic pain/cramping discussed, will start Micronor (started 02/2019), try Loestrin 1/20 continuously, skip placebo week start a new pack to see if any relief of pelvic cramping.  Annual due in March will evaluate then.

## 2019-07-09 ENCOUNTER — Encounter: Payer: Self-pay | Admitting: Physical Medicine and Rehabilitation

## 2019-07-13 MED FILL — NORETHINDRONE 0.35 MG TABS: 0.35 | 28 days supply | Qty: 28 | Fill #5

## 2019-07-13 MED FILL — SERTRALINE HCL 50 MG TABLET: 50 | 30 days supply | Qty: 27 | Fill #1

## 2019-07-13 MED FILL — PROPRANOLOL HCL ER 80 MG CP: 80 | 30 days supply | Qty: 30 | Fill #5

## 2019-08-10 ENCOUNTER — Ambulatory Visit (INDEPENDENT_AMBULATORY_CARE_PROVIDER_SITE_OTHER): Payer: 59 | Admitting: Women's Health

## 2019-08-10 ENCOUNTER — Other Ambulatory Visit: Payer: Self-pay

## 2019-08-10 ENCOUNTER — Encounter: Payer: Self-pay | Admitting: Women's Health

## 2019-08-10 VITALS — BP 118/80 | Ht 67.0 in | Wt 172.0 lb

## 2019-08-10 DIAGNOSIS — R7309 Other abnormal glucose: Secondary | ICD-10-CM | POA: Diagnosis not present

## 2019-08-10 DIAGNOSIS — Z01419 Encounter for gynecological examination (general) (routine) without abnormal findings: Secondary | ICD-10-CM | POA: Diagnosis not present

## 2019-08-10 MED ORDER — OXYCODONE-ACETAMINOPHEN 5-325 MG PO TABS: 1.0000 | ORAL_TABLET | ORAL | 0 refills | Status: DC | PRN

## 2019-08-10 MED ORDER — NORETHINDRONE 0.35 MG PO TABS: 1.0000 | ORAL_TABLET | Freq: Every day | ORAL | 4 refills | Status: DC

## 2019-08-10 MED FILL — NORETHINDRONE 0.35 MG TABS: 0.35 | 28 days supply | Qty: 28 | Fill #0

## 2019-08-10 NOTE — Progress Notes (Signed)
Penny Thomas 04/13/1986 [id number]    History:    Presents for annual exam.  2013 endometrial ablation amenorrheic/ BTL.  Severe monthly menstrual cramping/endometriosis.  Had been on [name] with good relief of pelvic pain after approximately 1 year on it started having nighttime hallucinations and stopped.  Takes an occasional Roxicet is aware it is addictive and uses sparingly.  Ultrasound 02/2019 hematometra noted and was started on Micronor, some relief of discomfort.  Normal Pap history.  Hemoglobin A1c 6.1 11/2018.  Numerous family members with type 2 diabetes.  Gestational diabetes with last pregnancy.  Past medical history, past surgical history, family history and social history were all reviewed and documented in the EPIC chart.  CNA at [employer]  4 sons ages 84, twins 59, and 23.  ROS:  A ROS was performed and pertinent positives and negatives are included.  Exam:  Vitals:   08/10/19 1601  BP: 118/80  Weight: 172 lb (78 kg)  Height: 5\' 7"  (1.702 m)   Body mass index is 26.94 kg/m.   General appearance:  Normal Thyroid:  Symmetrical, normal in size, without palpable masses or nodularity. Respiratory  Auscultation:  Clear without wheezing or rhonchi Cardiovascular  Auscultation:  Regular rate, without rubs, murmurs or gallops  Edema/varicosities:  Not grossly evident Abdominal  Soft,nontender, without masses, guarding or rebound.  Liver/spleen:  No organomegaly noted  Hernia:  None appreciated  Skin  Inspection:  Grossly normal   Breasts: Examined lying and sitting.     Right: Without masses, retractions, discharge or axillary adenopathy.     Left: Without masses, retractions, discharge or axillary adenopathy. Gentitourinary   Inguinal/mons:  Normal without inguinal adenopathy  External genitalia:  Normal  BUS/Urethra/Skene's glands:  Normal  Vagina:  Normal  Cervix:  Normal  Uterus:  normal in size, shape and contour.  Midline and mobile minimal  tenderness with exam  Adnexa/parametria:     Rt: Without masses or tenderness.   Lt: Without masses or tenderness.  Anus and perineum: Normal  Digital rectal exam: Normal sphincter tone without palpated masses or tenderness  Assessment/Plan:  34 y.o. MBF G3, P4 for annual exam with continued monthly menstrual cramping/pain without bleeding.  2013 endometrial ablation amenorrheic/BTL Chronic pelvic pain /hematometra/endometriosis Anxiety/phobias, Asthma-primary care manages meds  Plan: Options reviewed we will continue Micronor daily and call if symptoms worsen.  Refill of Roxicet given is aware of active properties and to use sparingly.  SBEs, exercise, calcium rich foods, vitamin D 1000 IUs daily encouraged.  Aware of need to decrease saturated fat, simple sugars and carbs will repeat hemoglobin A1c today.  CBC, lipid panel, Pap with HR HPV typing, new screening guidelines reviewed.  [place], 4:31 PM 08/10/2019

## 2019-08-10 NOTE — Patient Instructions (Addendum)
Vit D daily 1000 iu  Health Maintenance, Female Adopting a healthy lifestyle and getting preventive care are important in promoting health and wellness. Ask your health care provider about:  The right schedule for you to have regular tests and exams.  Things you can do on your own to prevent diseases and keep yourself healthy. What should I know about diet, weight, and exercise? Eat a healthy diet   Eat a diet that includes plenty of vegetables, fruits, low-fat dairy products, and lean protein.  Do not eat a lot of foods that are high in solid fats, added sugars, or sodium. Maintain a healthy weight Body mass index (BMI) is used to identify weight problems. It estimates body fat based on height and weight. Your health care provider can help determine your BMI and help you achieve or maintain a healthy weight. Get regular exercise Get regular exercise. This is one of the most important things you can do for your health. Most adults should:  Exercise for at least 150 minutes each week. The exercise should increase your heart rate and make you sweat (moderate-intensity exercise).  Do strengthening exercises at least twice a week. This is in addition to the moderate-intensity exercise.  Spend less time sitting. Even light physical activity can be beneficial. Watch cholesterol and blood lipids Have your blood tested for lipids and cholesterol at 34 years of age, then have this test every 5 years. Have your cholesterol levels checked more often if:  Your lipid or cholesterol levels are high.  You are older than 34 years of age.  You are at high risk for heart disease. What should I know about cancer screening? Depending on your health history and family history, you may need to have cancer screening at various ages. This may include screening for:  Breast cancer.  Cervical cancer.  Colorectal cancer.  Skin cancer.  Lung cancer. What should I know about heart disease, diabetes,  and high blood pressure? Blood pressure and heart disease  High blood pressure causes heart disease and increases the risk of stroke. This is more likely to develop in people who have high blood pressure readings, are of African descent, or are overweight.  Have your blood pressure checked: ? Every 3-5 years if you are 95-69 years of age. ? Every year if you are 15 years old or older. Diabetes Have regular diabetes screenings. This checks your fasting blood sugar level. Have the screening done:  Once every three years after age 6 if you are at a normal weight and have a low risk for diabetes.  More often and at a younger age if you are overweight or have a high risk for diabetes. What should I know about preventing infection? Hepatitis B If you have a higher risk for hepatitis B, you should be screened for this virus. Talk with your health care provider to find out if you are at risk for hepatitis B infection. Hepatitis C Testing is recommended for:  Everyone born from [birth year] through 1965.  Anyone with known risk factors for hepatitis C. Sexually transmitted infections (STIs)  Get screened for STIs, including gonorrhea and chlamydia, if: ? You are sexually active and are younger than 34 years of age. ? You are older than 34 years of age and your health care provider tells you that you are at risk for this type of infection. ? Your sexual activity has changed since you were last screened, and you are at increased risk for chlamydia or gonorrhea.  Ask your health care provider if you are at risk.  Ask your health care provider about whether you are at high risk for HIV. Your health care provider may recommend a prescription medicine to help prevent HIV infection. If you choose to take medicine to prevent HIV, you should first get tested for HIV. You should then be tested every 3 months for as long as you are taking the medicine. Pregnancy  If you are about to stop having your period  (premenopausal) and you may become pregnant, seek counseling before you get pregnant.  Take 400 to 800 micrograms (mcg) of folic acid every day if you become pregnant.  Ask for birth control (contraception) if you want to prevent pregnancy. Osteoporosis and menopause Osteoporosis is a disease in which the bones lose minerals and strength with aging. This can result in bone fractures. If you are 60 years old or older, or if you are at risk for osteoporosis and fractures, ask your health care provider if you should:  Be screened for bone loss.  Take a calcium or vitamin D supplement to lower your risk of fractures.  Be given hormone replacement therapy (HRT) to treat symptoms of menopause. Follow these instructions at home: Lifestyle  Do not use any products that contain nicotine or tobacco, such as cigarettes, e-cigarettes, and chewing tobacco. If you need help quitting, ask your health care provider.  Do not use street drugs.  Do not share needles.  Ask your health care provider for help if you need support or information about quitting drugs. Alcohol use  Do not drink alcohol if: ? Your health care provider tells you not to drink. ? You are pregnant, may be pregnant, or are planning to become pregnant.  If you drink alcohol: ? Limit how much you use to 0-1 drink a day. ? Limit intake if you are breastfeeding.  Be aware of how much alcohol is in your drink. In the [place] one drink equals one 12 oz bottle of beer (355 mL), one 5 oz glass of wine (148 mL), or one 1 oz glass of hard liquor (44 mL). General instructions  Schedule regular health, dental, and eye exams.  Stay current with your vaccines.  Tell your health care provider if: ? You often feel depressed. ? You have ever been abused or do not feel safe at home. Summary  Adopting a healthy lifestyle and getting preventive care are important in promoting health and wellness.  Follow your health care provider's  instructions about healthy diet, exercising, and getting tested or screened for diseases.  Follow your health care provider's instructions on monitoring your cholesterol and blood pressure. This information is not intended to replace advice given to you by your health care provider. Make sure you discuss any questions you have with your health care provider. Document Revised: 05/06/2018 Document Reviewed: 05/06/2018 Elsevier Patient Education  2020 [employer] for Diabetes Mellitus, Adult  Carbohydrate counting is a method of keeping track of how many carbohydrates you eat. Eating carbohydrates naturally increases the amount of sugar (glucose) in the blood. Counting how many carbohydrates you eat helps keep your blood glucose within normal limits, which helps you manage your diabetes (diabetes mellitus). It is important to know how many carbohydrates you can safely have in each meal. This is different for every person. A diet and nutrition specialist (registered dietitian) can help you make a meal plan and calculate how many carbohydrates you should have at each meal and  snack. Carbohydrates are found in the following foods:  Grains, such as breads and cereals.  Dried beans and soy products.  Starchy vegetables, such as potatoes, peas, and corn.  Fruit and fruit juices.  Milk and yogurt.  Sweets and snack foods, such as cake, cookies, candy, chips, and soft drinks. How do I count carbohydrates? There are two ways to count carbohydrates in food. You can use either of the methods or a combination of both. Reading "Nutrition Facts" on packaged food The "Nutrition Facts" list is included on the labels of almost all packaged foods and beverages in the [place] It includes:  The serving size.  Information about nutrients in each serving, including the grams (g) of carbohydrate per serving. To use the "Nutrition Facts":  Decide how many servings you will  have.  Multiply the number of servings by the number of carbohydrates per serving.  The resulting number is the total amount of carbohydrates that you will be having. Learning standard serving sizes of other foods When you eat carbohydrate foods that are not packaged or do not include "Nutrition Facts" on the label, you need to measure the servings in order to count the amount of carbohydrates:  Measure the foods that you will eat with a food scale or measuring cup, if needed.  Decide how many standard-size servings you will eat.  Multiply the number of servings by 15. Most carbohydrate-rich foods have about 15 g of carbohydrates per serving. ? For example, if you eat 8 oz (170 g) of strawberries, you will have eaten 2 servings and 30 g of carbohydrates (2 servings x 15 g = 30 g).  For foods that have more than one food mixed, such as soups and casseroles, you must count the carbohydrates in each food that is included. The following list contains standard serving sizes of common carbohydrate-rich foods. Each of these servings has about 15 g of carbohydrates:   hamburger bun or  English muffin.   oz (15 mL) syrup.   oz (14 g) jelly.  1 slice of bread.  1 six-inch tortilla.  3 oz (85 g) cooked rice or pasta.  4 oz (113 g) cooked dried beans.  4 oz (113 g) starchy vegetable, such as peas, corn, or potatoes.  4 oz (113 g) hot cereal.  4 oz (113 g) mashed potatoes or  of a large baked potato.  4 oz (113 g) canned or frozen fruit.  4 oz (120 mL) fruit juice.  4-6 crackers.  6 chicken nuggets.  6 oz (170 g) unsweetened dry cereal.  6 oz (170 g) plain fat-free yogurt or yogurt sweetened with artificial sweeteners.  8 oz (240 mL) milk.  8 oz (170 g) fresh fruit or one small piece of fruit.  24 oz (680 g) popped popcorn. Example of carbohydrate counting Sample meal  3 oz (85 g) chicken breast.  6 oz (170 g) brown rice.  4 oz (113 g) corn.  8 oz (240 mL)  milk.  8 oz (170 g) strawberries with sugar-free whipped topping. Carbohydrate calculation 1. Identify the foods that contain carbohydrates: ? Rice. ? Corn. ? Milk. ? Strawberries. 2. Calculate how many servings you have of each food: ? 2 servings rice. ? 1 serving corn. ? 1 serving milk. ? 1 serving strawberries. 3. Multiply each number of servings by 15 g: ? 2 servings rice x 15 g = 30 g. ? 1 serving corn x 15 g = 15 g. ? 1 serving milk x  15 g = 15 g. ? 1 serving strawberries x 15 g = 15 g. 4. Add together all of the amounts to find the total grams of carbohydrates eaten: ? 30 g + 15 g + 15 g + 15 g = 75 g of carbohydrates total. Summary  Carbohydrate counting is a method of keeping track of how many carbohydrates you eat.  Eating carbohydrates naturally increases the amount of sugar (glucose) in the blood.  Counting how many carbohydrates you eat helps keep your blood glucose within normal limits, which helps you manage your diabetes.  A diet and nutrition specialist (registered dietitian) can help you make a meal plan and calculate how many carbohydrates you should have at each meal and snack. This information is not intended to replace advice given to you by your health care provider. Make sure you discuss any questions you have with your health care provider. Document Revised: 12/05/2016 Document Reviewed: 10/25/2015 Elsevier Patient Education  [employer].

## 2019-08-11 ENCOUNTER — Telehealth: Payer: Self-pay

## 2019-08-11 DIAGNOSIS — F410 Panic disorder [episodic paroxysmal anxiety] without agoraphobia: Secondary | ICD-10-CM

## 2019-08-11 DIAGNOSIS — F418 Other specified anxiety disorders: Secondary | ICD-10-CM

## 2019-08-11 LAB — LIPID PANEL
Cholesterol: 225 mg/dL — ABNORMAL HIGH (ref <200)
HDL: 63 mg/dL (ref >=50)
LDL Cholesterol (Calc): 140 mg/dL (calc) — ABNORMAL HIGH
Non-HDL Cholesterol (Calc): 162 mg/dL (calc) — ABNORMAL HIGH (ref <130)
Total CHOL/HDL Ratio: 3.6 (calc) (ref <5.0)
Triglycerides: 104 mg/dL (ref <150)

## 2019-08-11 LAB — CBC WITH DIFFERENTIAL/PLATELET
Absolute Monocytes: 474 cells/uL (ref 200–950)
Basophils Absolute: 18 cells/uL (ref 0–200)
Basophils Relative: 0.3 %
Eosinophils Absolute: 42 cells/uL (ref 15–500)
Eosinophils Relative: 0.7 %
HCT: 41.6 % (ref 35.0–45.0)
Hemoglobin: 13.6 g/dL (ref 11.7–15.5)
Lymphs Abs: 2286 cells/uL (ref 850–3900)
MCH: 29.4 pg (ref 27.0–33.0)
MCHC: 32.7 g/dL (ref 32.0–36.0)
MCV: 90 fL (ref 80.0–100.0)
MPV: 10.9 fL (ref 7.5–12.5)
Monocytes Relative: 7.9 %
Neutro Abs: 3180 cells/uL (ref 1500–7800)
Neutrophils Relative %: 53 %
Platelets: 270 10*3/uL (ref 140–400)
RBC: 4.62 10*6/uL (ref 3.80–5.10)
RDW: 13.8 % (ref 11.0–15.0)
Total Lymphocyte: 38.1 %
WBC: 6 10*3/uL (ref 3.8–10.8)

## 2019-08-11 LAB — HEMOGLOBIN A1C
Hgb A1c MFr Bld: 5.3 % of total Hgb (ref <5.7)
Mean Plasma Glucose: 105 (calc)
eAG (mmol/L): 5.8 (calc)

## 2019-08-11 MED ORDER — SERTRALINE HCL 50 MG PO TABS: 50.0000 mg | ORAL_TABLET | Freq: Every day | ORAL | 1 refills | Status: DC

## 2019-08-11 MED FILL — OXYCODONE-ACETAMINOPHEN 5-3: 5-325 | 5 days supply | Qty: 30 | Fill #0

## 2019-08-11 MED FILL — PROPRANOLOL HCL ER 80 MG CP: 80 | 30 days supply | Qty: 30 | Fill #6

## 2019-08-11 MED FILL — SERTRALINE HCL 50 MG TABLET: 50 | 30 days supply | Qty: 30 | Fill #0

## 2019-08-11 NOTE — Telephone Encounter (Signed)
done

## 2019-08-11 NOTE — Telephone Encounter (Signed)
Please check on this.

## 2019-08-11 NOTE — Telephone Encounter (Signed)
I received a fax from [facility] for a refill on the pts. Sertraline 50 mg last apt was 06/16/19.

## 2019-08-12 ENCOUNTER — Other Ambulatory Visit: Payer: Self-pay | Admitting: Neurology

## 2019-08-12 MED ORDER — NAPROXEN 500 MG PO TABS: 500.0000 mg | ORAL_TABLET | Freq: Two times a day (BID) | ORAL | 1 refills | Status: DC | PRN

## 2019-08-12 MED FILL — NAPROXEN 500 MG TABS: 500 | 30 days supply | Qty: 60 | Fill #0

## 2019-08-13 LAB — PAP, TP IMAGING W/ HPV RNA, RFLX HPV TYPE 16,18/45: HPV DNA High Risk: NOT DETECTED

## 2019-08-18 ENCOUNTER — Other Ambulatory Visit: Payer: Self-pay | Admitting: Family Medicine

## 2019-08-19 ENCOUNTER — Other Ambulatory Visit: Payer: Self-pay

## 2019-08-19 ENCOUNTER — Encounter: Payer: Self-pay | Admitting: Neurology

## 2019-08-19 ENCOUNTER — Telehealth (INDEPENDENT_AMBULATORY_CARE_PROVIDER_SITE_OTHER): Payer: 59 | Admitting: Neurology

## 2019-08-19 VITALS — Ht 67.0 in | Wt 171.0 lb

## 2019-08-19 DIAGNOSIS — G43009 Migraine without aura, not intractable, without status migrainosus: Secondary | ICD-10-CM

## 2019-08-19 DIAGNOSIS — G43109 Migraine with aura, not intractable, without status migrainosus: Secondary | ICD-10-CM

## 2019-08-19 MED ORDER — TRAMADOL HCL 50 MG PO TABS: 50.0000 mg | ORAL_TABLET | Freq: Two times a day (BID) | ORAL | 0 refills | Status: DC | PRN

## 2019-08-19 MED FILL — traMADol HCL 50 MG TABS: 50 | 10 days supply | Qty: 20 | Fill #0

## 2019-08-19 NOTE — Progress Notes (Signed)
Virtual Visit via Video Note The purpose of this virtual visit is to provide medical care while limiting exposure to the novel coronavirus.    Consent was obtained for video visit:  Yes.   Answered questions that patient had about telehealth interaction:  Yes.   I discussed the limitations, risks, security and privacy concerns of performing an evaluation and management service by telemedicine. I also discussed with the patient that there may be a patient responsible charge related to this service. The patient expressed understanding and agreed to proceed.  Pt location: Home Physician Location: office Name of referring provider:  Girtha Rm, NP-C I connected with Penny Thomas at patients initiation/request on 08/19/2019 at 10:50 AM EDT by video enabled telemedicine application and verified that I am speaking with the correct person using two identifiers. Pt MRN:  [id number] Pt DOB:  July 03, 1985 Video Participants:  Penny Thomas   History of Present Illness:  Penny Thomas is a 34 year old right-handed woman who follows up for migraines.  UPDATE: She had 3 migraines over the past month that responded to naproxen in an hour.  Previously, she would have 1 a month.  Change in weather is a trigger.  Rescue protocol: First line, naproxen 500mg  (at work), will take sumatriptan Hutchins or Reyvow at home. Current NSAID:  Naproxen 500mg  Current analgesics:tramadol 50mg  Current triptans:Sumatriptan Fallis(have not used them) Current ergotamine:none Other abortive therapy:  Reyvow Current anti-emetic:none Current muscle relaxants:none Current anti-anxiolytic:none Current sleep aide:none Current Antihypertensive medications:Propranolol ER80mg  Current Antidepressant medications:sertraline 50mg  Current Anticonvulsant medications:none Current anti-CGRP:none Current Vitamins/Herbal/Supplements:none Current  Antihistamines/Decongestants:none Other therapy:Reyvow Hormone/birth control:  [name] Other medication:none  Caffeine:no Alcohol:seldom Smoker:no Diet:hydrates Exercise:no Depression:no; Anxiety:no Other pain:no Sleep hygiene:okay  HISTORY: Onset:  Age 61. Location:Varies (bi-frontal, back of head) Quality:Squeezing/"feels like I am hit in the head with a hammer" Initial Intensity:9-10/10 Aura:blurred vision Prodrome:no Associated symptoms: Nausea, dizziness, photophobia, osmophobia Initial Duration:1 to 3 days initiail Frequency:3 to 4 days per week (16 headache days per week) Triggers:  Menstrual cycle, caffeine, scents (perfume, deoderant, hand sanitizer), change in weather Relieving factors: Laying down Activity:Lays down if severe  Past NSAIDS:Mobic, ibuprofen 800mg , Cambia Past analgesics:Hydrocodone-acetaminophen, tramadol 50mg , Roxicet, Excedrin Migraine Past abortive triptans:sumatriptan 100mg , Maxalt 10mg , Relpax 20mg , sumatriptan [place] Past ergot: none Past muscle relaxants:Flexeril, baclofen, tizanidine 2mg , Robaxin Past anti-nausea:Zofran 4mg , promethazine 25mg  Past antihypertensive medications:no Past antidepressant medications:amitriptyline 50mg  (weight gain) Past anticonvulsant medications:topiramate 150mg  (effective but caused hair loss) Past vitamins/Herbal/Supplements:no Past antihistamines/decongestants:no Other past medications:no  Family history of headache:Mom, sister, grandmother  Past Medical History: Past Medical History:  Diagnosis Date  . Allergy   . Anemia    takes iron supplement  . Asthma   . Asthma    prn inhaler  . Blood transfusion 2007  . Blood transfusion 2010  . Blood transfusion without reported diagnosis   . 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 Hemorrhage in uterus 01-20-09  . Migraines   . 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 Seasonal allergies   .  Sickle cell anemia (HCC)    trait  . Sickle cell trait (HCC)     Medications: Outpatient Encounter Medications as of 08/19/2019  Medication Sig  . albuterol (PROVENTIL HFA;VENTOLIN HFA) 108 (90 Base) MCG/ACT inhaler Inhale 1-2 puffs into the lungs every 6 (six) hours as needed for wheezing or shortness of breath.  . budesonide-formoterol (SYMBICORT) 160-4.5 MCG/ACT inhaler Inhale 2 puffs into the lungs 2 (two) times daily.  . cetirizine (ZYRTEC)  10 MG tablet Take 1 tablet (10 mg total) by mouth daily.  [place] Succinate (REYVOW) 100 MG TABS Take 1 tablet by mouth daily as needed (No more than 1 tab in 24 h).  . naproxen (NAPROSYN) 500 MG tablet Take 1 tablet (500 mg total) by mouth every 12 (twelve) hours as needed.  . norethindrone (MICRONOR) 0.35 MG tablet Take 1 tablet (0.35 mg total) by mouth daily.  Marland Kitchen olopatadine (PATANOL) 0.1 % ophthalmic solution Place 1 drop into both eyes 2 (two) times daily as needed for allergies.  Marland Kitchen oxyCODONE-acetaminophen (PERCOCET/ROXICET) 5-325 MG tablet Take 1 tablet by mouth every 4 (four) hours as needed for severe pain.  Marland Kitchen propranolol ER (INDERAL LA) 80 MG 24 hr capsule Take 1 capsule (80 mg total) by mouth daily.  . sertraline (ZOLOFT) 50 MG tablet Take 1 tablet (50 mg total) by mouth daily. Start 25 mg daily x 1 week.  Marland Kitchen tiZANidine (ZANAFLEX) 2 MG tablet Take 1-2 tablets (2-4 mg total) by mouth every 6 (six) hours as needed for muscle spasms.  . traMADol (ULTRAM) 50 MG tablet Take 1 tablet (50 mg total) by mouth every 6 (six) hours as needed.   No facility-administered encounter medications on file as of 08/19/2019.    Allergies: Allergies  Allergen Reactions  . [name] [Elagolix] Other (See Comments)    Hallucinations    Family History: Family History  Problem Relation Age of Onset  . Fibromyalgia Mother   . Multiple sclerosis Mother   . Asthma Father   . Healthy Maternal Grandmother   . Healthy Maternal Grandfather   . Healthy  Paternal Grandmother   . Healthy Paternal Grandfather   . Endometriosis Sister   . Healthy Brother   . Healthy Child     Social History: Social History   Socioeconomic History  . Marital status: Married    Spouse name: Not on file  . Number of children: 4  . Years of education: 59  . Highest education level: Associate degree: academic program  Occupational History  . Occupation: NT/Transporter  Tobacco Use  . Smoking status: Never Smoker  . Smokeless tobacco: Never Used  Substance and Sexual Activity  . Alcohol use: No    Alcohol/week: 0.0 standard drinks    Comment: seldom  . Drug use: No  . Sexual activity: Yes    Birth control/protection: Surgical, None    Comment: TUBAL LIGATION, INTERCOURSE AGE 33, SEXUAL PARTNERS 5  Other Topics Concern  . Not on file  Social History Narrative   Fun: Baking, playing with the children.   Denies abuse and feels safe at home.       Pt does not drink coffee, tea or soda   Pt lives with spouse and 4 children   Pt is right handed   She has a associates degree       Social Determinants of Radio broadcast assistant Strain:   . Difficulty of Paying Living Expenses:   Food Insecurity:   . Worried About Charity fundraiser in the Last Year:   . Arboriculturist in the Last Year:   Transportation Needs:   . Film/video editor (Medical):   Marland Kitchen Lack of Transportation (Non-Medical):   Physical Activity:   . Days of Exercise per Week:   . Minutes of Exercise per Session:   Stress:   . Feeling of Stress :   Social Connections:   . Frequency of Communication with Friends and Family:   .  Frequency of Social Gatherings with Friends and Family:   . Attends Religious Services:   . Active Member of Clubs or Organizations:   . Attends Archivist Meetings:   Marland Kitchen Marital Status:   Intimate Partner Violence:   . Fear of Current or Ex-Partner:   . Emotionally Abused:   Marland Kitchen Physically Abused:   . Sexually Abused:      Observations/Objective:   Height 5\' 7"  (1.702 m), weight 171 lb (77.6 kg). No acute distress.  Alert and oriented.  Speech fluent and not dysarthric.  Language intact.  Eyes orthophoric on primary gaze.  Face symmetric.  Assessment and Plan:   1.  Migraine with aura, without status migrainosus, not intractable 2.  Migraine without aura, without status migrainosus, not intractable  1.  For preventative management, propranolol ER 80mg  daily 2.  For abortive therapy:  1.  Naproxen; 2.  Sumatriptan NS or Reyvow 3.  Limit use of pain relievers to no more than 2 days out of week to prevent risk of rebound or medication-overuse headache. 4.  Keep headache diary 5.  Exercise, hydration, caffeine cessation, sleep hygiene, monitor for and avoid triggers 6. Follow up 9 months   Follow Up Instructions:    -I discussed the assessment and treatment plan with the patient. The patient was provided an opportunity to ask questions and all were answered. The patient agreed with the plan and demonstrated an understanding of the instructions.   The patient was advised to call back or seek an in-person evaluation if the symptoms worsen or if the condition fails to improve as anticipated.    Dudley Major, DO

## 2019-08-19 NOTE — Telephone Encounter (Signed)
Ok to rf? 

## 2019-09-09 ENCOUNTER — Telehealth: Payer: Self-pay

## 2019-09-09 MED FILL — SERTRALINE HCL 50 MG TABLET: 50 | 30 days supply | Qty: 30 | Fill #1

## 2019-09-09 MED FILL — NORETHINDRONE 0.35 MG TABS: 0.35 | 28 days supply | Qty: 28 | Fill #1

## 2019-09-09 MED FILL — PROPRANOLOL HCL ER 80 MG CP: 80 | 30 days supply | Qty: 30 | Fill #7

## 2019-09-09 NOTE — Telephone Encounter (Signed)
Following email received from patient on 48/21: "Is their anyway i can try a muscle relaxer  that I can take while I am at work?"  On 4/9/21Jennifer replied:I see you have a primary care physician Dr.Henson I think she would be the correct doctor to send this request. Muscle relaxer can make you feel fatigue so I am not sure it that's something you would want to take at work.  Patient replied today 09/09/19: "My primary told.me.to ask my obgyn since its a female issue is their anything I can take for the pain or if it gets to the point which should I go to the hospital or women's ? It continues pain and not like it was before  what can I do?  I replied to patient today " I can send a message to [place] to see what she recommends. [facility] no longer exists so you would want to go to [facility] ER where the [facility] is now and they could help you. If pain has worsened Izora Gala might recommend you make an appointment but I will send her a message. She is off on Thursday and Friday but usually checks her inbox periodically. "

## 2019-09-09 NOTE — Telephone Encounter (Signed)
Telephone call to [facility], states has had some relief from every effort we have made from [place], combination birth control pills, Micronor, ablation they have all helped for a while and then the intense cramping and pain comes back.  Has pain most weeks of the month week prior to when cycles due, week of cycle 1 week after although not having any bleeding. Minimal relief with any med and states does not want to take narcotics but do take the edge off but would prefer a lasting treatment.  Option of hysterectomy reviewed.  Would like to discuss with Dr Dellis Filbert.  Juliann Pulse, can you get her scheduled for an appointment with Dr. Dellis Filbert to discuss possible hysterectomy/treatment for endometriosis/chronic pelvic pain.  Fridays are best and Friday after 3 would be ideal.  thanks

## 2019-09-09 NOTE — Telephone Encounter (Signed)
Forwarded appt request to [facility] to contact patient and schedule appt.

## 2019-09-10 ENCOUNTER — Other Ambulatory Visit: Payer: Self-pay | Admitting: Women's Health

## 2019-09-10 MED ORDER — CELECOXIB 50 MG PO CAPS: 50.0000 mg | ORAL_CAPSULE | Freq: Two times a day (BID) | ORAL | 2 refills | Status: DC

## 2019-09-10 MED FILL — CELECOXIB 50 MG CAPS: 50 | 15 days supply | Qty: 30 | Fill #0

## 2019-09-23 MED FILL — CELECOXIB 50 MG CAPS: 50 | 15 days supply | Qty: 30 | Fill #1

## 2019-09-27 ENCOUNTER — Other Ambulatory Visit: Payer: Self-pay | Admitting: Nurse Practitioner

## 2019-09-27 DIAGNOSIS — N946 Dysmenorrhea, unspecified: Secondary | ICD-10-CM

## 2019-09-27 MED ORDER — CELECOXIB 100 MG PO CAPS: 100.0000 mg | ORAL_CAPSULE | Freq: Two times a day (BID) | ORAL | 2 refills | Status: DC

## 2019-09-27 MED FILL — CELECOXIB 100 MG CAPS: 100 | 30 days supply | Qty: 60 | Fill #0

## 2019-10-03 ENCOUNTER — Other Ambulatory Visit: Payer: Self-pay | Admitting: Family Medicine

## 2019-10-04 MED ORDER — TRAMADOL HCL 50 MG PO TABS: 50.0000 mg | ORAL_TABLET | Freq: Two times a day (BID) | ORAL | 0 refills | Status: DC | PRN

## 2019-10-04 NOTE — Telephone Encounter (Signed)
Please advise 

## 2019-10-04 NOTE — Telephone Encounter (Signed)
Hilts Patient

## 2019-10-14 ENCOUNTER — Other Ambulatory Visit: Payer: Self-pay | Admitting: Family Medicine

## 2019-10-14 DIAGNOSIS — F418 Other specified anxiety disorders: Secondary | ICD-10-CM

## 2019-10-14 DIAGNOSIS — F410 Panic disorder [episodic paroxysmal anxiety] without agoraphobia: Secondary | ICD-10-CM

## 2019-10-14 NOTE — Telephone Encounter (Signed)
Left message for pt to call back to schedule an appt to follow-up but anymore refills. Was suppose to follow-up 2 weeks after last appt in janaury and never did

## 2019-10-18 ENCOUNTER — Other Ambulatory Visit: Payer: Self-pay

## 2019-10-18 ENCOUNTER — Telehealth (INDEPENDENT_AMBULATORY_CARE_PROVIDER_SITE_OTHER): Payer: 59 | Admitting: Family Medicine

## 2019-10-18 ENCOUNTER — Other Ambulatory Visit: Payer: Self-pay | Admitting: Family Medicine

## 2019-10-18 ENCOUNTER — Encounter: Payer: Self-pay | Admitting: Family Medicine

## 2019-10-18 VITALS — Wt 165.0 lb

## 2019-10-18 DIAGNOSIS — F418 Other specified anxiety disorders: Secondary | ICD-10-CM

## 2019-10-18 DIAGNOSIS — F410 Panic disorder [episodic paroxysmal anxiety] without agoraphobia: Secondary | ICD-10-CM

## 2019-10-18 DIAGNOSIS — F40242 Fear of bridges: Secondary | ICD-10-CM

## 2019-10-18 DIAGNOSIS — R69 Illness, unspecified: Secondary | ICD-10-CM | POA: Diagnosis not present

## 2019-10-18 DIAGNOSIS — F40241 Acrophobia: Secondary | ICD-10-CM

## 2019-10-18 MED ORDER — SERTRALINE HCL 50 MG PO TABS: 50.0000 mg | ORAL_TABLET | Freq: Every day | ORAL | 4 refills | Status: DC

## 2019-10-18 MED FILL — SERTRALINE HCL 50 MG TABS: 50 | 30 days supply | Qty: 30 | Fill #0

## 2019-10-18 NOTE — Patient Instructions (Addendum)
I am refilling your sertraline (Zoloft). Be aware that taking this medication along with NSAIDs such as naproxen, Aleve, ibuprofen, Advil, Motrin, or aspirin containing products may increase your risk of gastrointestinal bleeding.   Follow up with me in 4 months.

## 2019-10-18 NOTE — Progress Notes (Addendum)
   Subjective:  Documentation for virtual audio and video telecommunications through [place] encounter:  The patient was located at home. 2 patient identifiers used.  The provider was located in the office. The patient did consent to this visit and is aware of possible charges through their insurance for this visit.  The other persons participating in this telemedicine service were none. Time spent on call was 10 minutes and in review of previous records 15 minutes total.  This virtual service is not related to other E/M service within previous 7 days.   Patient ID: Penny Thomas, female    DOB: 1986/03/17, 34 y.o.   MRN: [id number]  HPI Chief Complaint  Patient presents with  . other    virtual f/u on new medication   This is a follow up on anxiety related to certain situations including driving over bridges, heights and other situations. She has done well since starting on sertraline in January 2021. She did not follow up with me until today. States her medication refills ran out and she would like refills. States she medication worked well for her anxiety and panic attacks. States she and her husband could tell this weekend that her anxiety was worse off of the medication.   Denies fever, chills, dizziness, chest pain, palpitations, shortness of breath, abdominal pain, N/V/D.   States she is supposed to follow up with OB/GYN and states she may have to have a hysterectomy.   Reviewed allergies, medications, past medical, surgical, family, and social history.    Review of Systems Pertinent positives and negatives in the history of present illness.     Objective:   Physical Exam Wt 165 lb (74.8 kg)   BMI 25.84 kg/m   Alert and oriented in no acute distress.  Normal speech, mood and thought process.      Assessment & Plan:  Situational anxiety - Plan: sertraline (ZOLOFT) 50 MG tablet  Panic attacks - Plan: sertraline (ZOLOFT) 50 MG tablet  Fear of heights   Fear of bridges  She did not follow up with me after starting on sertraline in January but reports that she has been doing well on the medication and would like to continue taking it for her anxiety and panic attacks.  She can tell when she was not on it but the medication was effective.  I will refill sertraline and have her follow-up in approximately 4 months.  After visit summary sent advising her of the increased risk of GI bleeding with SSRIs and NSAIDs.

## 2019-10-19 MED FILL — PROPRANOLOL HCL ER 80 MG CP: 80 | 30 days supply | Qty: 30 | Fill #8

## 2019-10-19 NOTE — Progress Notes (Signed)
Mailed AVS and left message for pt to call pt and schedule 4 month fu

## 2019-10-21 ENCOUNTER — Other Ambulatory Visit: Payer: Self-pay

## 2019-10-22 ENCOUNTER — Encounter: Payer: Self-pay | Admitting: Obstetrics & Gynecology

## 2019-10-22 ENCOUNTER — Ambulatory Visit: Payer: 59 | Admitting: Obstetrics & Gynecology

## 2019-10-22 VITALS — BP 124/78

## 2019-10-22 DIAGNOSIS — N946 Dysmenorrhea, unspecified: Secondary | ICD-10-CM | POA: Diagnosis not present

## 2019-10-22 DIAGNOSIS — N857 Hematometra: Secondary | ICD-10-CM

## 2019-10-22 DIAGNOSIS — Z9889 Other specified postprocedural states: Secondary | ICD-10-CM

## 2019-10-22 DIAGNOSIS — R102 Pelvic and perineal pain: Secondary | ICD-10-CM | POA: Diagnosis not present

## 2019-10-22 DIAGNOSIS — N803 Endometriosis of pelvic peritoneum: Secondary | ICD-10-CM

## 2019-10-22 DIAGNOSIS — IMO0002 Reserved for concepts with insufficient information to code with codable children: Secondary | ICD-10-CM

## 2019-10-22 MED ORDER — OXYCODONE-ACETAMINOPHEN 7.5-325 MG PO TABS: 1.0000 | ORAL_TABLET | Freq: Four times a day (QID) | ORAL | 0 refills | Status: DC | PRN

## 2019-10-22 MED ORDER — TRAMADOL HCL 50 MG PO TABS: 50.0000 mg | ORAL_TABLET | Freq: Two times a day (BID) | ORAL | 0 refills | Status: DC | PRN

## 2019-10-22 MED ORDER — NORETHINDRONE 0.35 MG PO TABS: 1.0000 | ORAL_TABLET | Freq: Every day | ORAL | 1 refills | Status: DC

## 2019-10-22 MED FILL — traMADol HCL 50 MG TABS: 50 | 6 days supply | Qty: 12 | Fill #0

## 2019-10-22 MED FILL — OXYCODON-ACETAMINOPHEN 7.5-: 7.5-325 | 3 days supply | Qty: 12 | Fill #0

## 2019-10-22 MED FILL — NORETHINDRONE 0.35 MG TABS: 0.35 | 28 days supply | Qty: 28 | Fill #0

## 2019-10-22 NOTE — Progress Notes (Signed)
Penny Thomas 03/22/1986 [id number]        34 y.o.    G3P3L4 Married.  S/P TL.  Nurse.  RP: Severe pelvic pain post Endometrial Ablation/Endometriosis   HPI: Started on the Progestin pill 02/2019 which helped for a couple of months, but then the pain/dysmenorrhea worsened again so patient stopped the pill.  Insurance didn't cover DepoLupron.  S/P Endometrial Ablation.  Severe monthly pelvic pains/cramps x about 5 days with persistent lower intensity pain continuously.  Deep pain with IC.  Pelvic US suggestive of Hematometra 02/2019.  H/O Endometriosis, stopped [name] because of hallucinations at night.  OB History  Gravida Para Term Preterm AB Living  3 3     0 4  SAB TAB Ectopic Multiple Live Births      0 1 4    # Outcome Date GA Lbr Len/2nd Weight Sex Delivery Anes PTL Lv  3 Para 01/20/09 [redacted]w[redacted]d    CS-Classical   LIV  2A Para 09/28/05 [redacted]w[redacted]d    Vag-Spont   LIV     Birth Comments: twins  2B Para 09/28/05 [redacted]w[redacted]d    CS-Classical   LIV  1 Para 07/03/04 [redacted]w[redacted]d    Vag-Spont   LIV    Past medical history,surgical history, problem list, medications, allergies, family history and social history were all reviewed and documented in the EPIC chart.   Directed ROS with pertinent positives and negatives documented in the history of present illness/assessment and plan.  Exam:  Vitals:   10/22/19 1253  BP: 124/78   General appearance:  Normal  Abdomen: Normal  Gynecologic exam: Vulva normal.  Bimanual exam:  Uterus AV, normal volume, mobile, tender.  No adnexal mass felt, NT bilaterally.   Pelvic US 02/25/2019: T/V images.  Anteverted uterus normal in size and shape with no myometrial mass, the uterus measures 7.72 x 4.04 x 3.63 cm.  Irregular fluid collection measuring 5.5 cm in left cornual area extending into the proximal aspect of the left tube.  Part of the fluid collection shows debris measuring 2.1 x 1 cm.  The endometrium is irregular, but not thickened, probably  secondary to endometrial ablation.  Both ovaries are normal in size with normal follicular pattern.  A dominant follicle is present on the left ovary measuring 2.7 cm.  No adnexal mass.  No free fluid in the posterior cul-de-sac.    Assessment/Plan:  34 y.o. G3P0004   1. Pelvic pain in female Worsening pelvic pain especially dysmenorrhea with known hematometra status post endometrial ablation and history of endometriosis.  Pain not controlled on the progestin pill.  Patient requiring narcotics to control the pain.  Decision to proceed with XI robotic total laparoscopic hysterectomy with bilateral salpingectomy.  Patient is status post bilateral tubal ligation and had to C-sections.  Information and pamphlet given.  Will schedule a preop televisit.  Will start back on the progestin pill until surgery.  2. S/P endometrial ablation As above.  3. Dysmenorrhea Severe dysmenorrhea not controlled on the progestin pill.  Ibuprofen and tramadol not sufficient to control the pain.  We will send a prescription of Percocet as needed.  4. Hematometra Pelvic ultrasound October 2020 showed an intra uterine fluid collection extending to the proximal aspect of the left tube.  5. Endometriosis of pelvis History of endometriosis.  Other orders - norethindrone (MICRONOR) 0.35 MG tablet; Take 1 tablet (0.35 mg total) by mouth daily. - oxyCODONE-acetaminophen (PERCOCET) 7.5-325 MG tablet; Take 1 tablet by mouth every 6 (six)  hours as needed for severe pain.  [name] MD, 1:18 PM 10/22/2019

## 2019-10-22 NOTE — Patient Instructions (Signed)
1. Pelvic pain in female Worsening pelvic pain especially dysmenorrhea with known hematometra status post endometrial ablation and history of endometriosis.  Pain not controlled on the progestin pill.  Patient requiring narcotics to control the pain.  Decision to proceed with XI robotic total laparoscopic hysterectomy with bilateral salpingectomy.  Patient is status post bilateral tubal ligation and had to C-sections.  Information and pamphlet given.  Will schedule a preop televisit.  Will start back on the progestin pill until surgery.  2. S/P endometrial ablation As above.  3. Dysmenorrhea Severe dysmenorrhea not controlled on the progestin pill.  Ibuprofen and tramadol not sufficient to control the pain.  We will send a prescription of Percocet as needed.  4. Hematometra Pelvic ultrasound October 2020 showed an intra uterine fluid collection extending to the proximal aspect of the left tube.  5. Endometriosis of pelvis History of endometriosis.  Other orders - norethindrone (MICRONOR) 0.35 MG tablet; Take 1 tablet (0.35 mg total) by mouth daily. - oxyCODONE-acetaminophen (PERCOCET) 7.5-325 MG tablet; Take 1 tablet by mouth every 6 (six) hours as needed for severe pain.  [name], it was a pleasure seeing you today!

## 2019-10-26 ENCOUNTER — Telehealth: Payer: Self-pay

## 2019-10-26 DIAGNOSIS — Z90710 Acquired absence of both cervix and uterus: Secondary | ICD-10-CM

## 2019-10-26 HISTORY — DX: Acquired absence of both cervix and uterus: Z90.710

## 2019-10-26 NOTE — Telephone Encounter (Signed)
I called patient to discuss scheduling surgery. We reviewed her ins benefits and her estimated GGA surgery prepymt due by week before surgery based on those benefits.  I scheduled her for 12/01/19 at 8:30am at [facility].  I advised her of need for Covid test prior to surgery and appointment was scheduled accordingly.  I reviewed with her the quarantine protocol for after the Covid test.  Pre op appt was scheduled. Packet will be mailed to her.

## 2019-10-29 ENCOUNTER — Encounter: Payer: Self-pay | Admitting: Anesthesiology

## 2019-11-04 ENCOUNTER — Encounter: Payer: Self-pay | Admitting: Family Medicine

## 2019-11-04 ENCOUNTER — Encounter: Payer: Self-pay | Admitting: Anesthesiology

## 2019-11-05 ENCOUNTER — Encounter: Payer: Self-pay | Admitting: Anesthesiology

## 2019-11-05 MED ORDER — CYCLOBENZAPRINE HCL 10 MG PO TABS
10.0000 mg | ORAL_TABLET | Freq: Three times a day (TID) | ORAL | 1 refills | Status: DC | PRN
Start: 2019-11-05 — End: 2020-05-24

## 2019-11-13 ENCOUNTER — Other Ambulatory Visit: Payer: Self-pay

## 2019-11-13 MED FILL — SERTRALINE HCL 50 MG TABS: 50 | 30 days supply | Qty: 30 | Fill #1

## 2019-11-15 ENCOUNTER — Other Ambulatory Visit: Payer: Self-pay | Admitting: Neurology

## 2019-11-15 ENCOUNTER — Other Ambulatory Visit: Payer: Self-pay

## 2019-11-15 MED ORDER — OXYCODONE-ACETAMINOPHEN 7.5-325 MG PO TABS: 1.0000 | ORAL_TABLET | Freq: Four times a day (QID) | ORAL | 0 refills | Status: DC | PRN

## 2019-11-15 MED FILL — PROPRANOLOL HCL ER 80 MG CP: 80 | 30 days supply | Qty: 30 | Fill #0

## 2019-11-15 MED FILL — OXYCODON-ACETAMINOPHEN 7.5-: 7.5-325 | 3 days supply | Qty: 12 | Fill #0

## 2019-11-15 NOTE — Telephone Encounter (Signed)
Patient informed by email reply.

## 2019-11-15 NOTE — Telephone Encounter (Signed)
10/22/19 Dr. Marguerita Merles wrote "3. Dysmenorrhea Severe dysmenorrhea not controlled on the progestin pill.  Ibuprofen and tramadol not sufficient to control the pain.  We will send a prescription of Percocet as needed."   Scheduled for TLH, BSE on 12/01/19.

## 2019-11-15 NOTE — Telephone Encounter (Signed)
Dr Raliegh Ip, please sign the Percocet Rx. Thanks

## 2019-11-15 NOTE — Telephone Encounter (Signed)
Penny Thomas for refill of percocet, note says tramadol did not work. Use ibuprofen 600 mg every 6 hours with food and percocet as needed. #12 tabs, follow up with Dr. Marguerita Merles next week

## 2019-11-17 NOTE — Patient Instructions (Addendum)
DUE TO COVID-19 ONLY ONE VISITOR IS ALLOWED IN WAITING ROOM (VISITOR WILL HAVE A TEMPERATURE CHECK ON ARRIVAL AND MUST WEAR A FACE MASK THE ENTIRE TIME.)  ONCE YOU ARE ADMITTED TO YOUR PRIVATE ROOM, THE SAME ONE VISITOR IS ALLOWED TO VISIT DURING VISITING HOURS ONLY.  Your COVID swab testing is scheduled for: 11/27/19  at 11:30 am  , You must self quarantine after your testing per handout given to you at the testing site.  ([facility] testing enter pre-surgical testing line)   Your procedure is scheduled on: 12/01/19  Report to [facility] AT : 6:30 A. M.   Call this number if you have problems the morning of surgery:[phone].   OUR ADDRESS IS [place].  WE ARE LOCATED IN THE [place]                                   [facility].                                     REMEMBER:   DO NOT EAT FOOD OR DRINK LIQUIDS AFTER MIDNIGHT .    BRUSH YOUR TEETH THE MORNING OF SURGERY.  TAKE THESE MEDICATIONS MORNING OF SURGERY WITH A SIP OF WATER: cetirizine.  DO NOT WEAR JEWERLY, MAKE UP, OR NAIL POLISH.  DO NOT WEAR LOTIONS, POWDERS, PERFUMES/COLOGNE OR DEODORANT.  DO NOT SHAVE FOR 24 HOURS PRIOR TO DAY OF SURGERY.  YOU MAY BRING A SMALL OVERNIGHT BAG  CONTACTS, GLASSES, OR DENTURES MAY NOT BE WORN TO SURGERY.                                    [facility] IS NOT RESPONSIBLE  FOR ANY BELONGINGS.          BRING ALL PRESCRIPTION MEDICATIONS WITH YOU THE DAY OF SURGERY IN ORIGINAL CONTAINERS                                                               .[facility] - Preparing for Surgery Before surgery, you can play an important role.  Because skin is not sterile, your skin needs to be as free of germs as possible.  You can reduce the number of germs on your skin by washing with CHG (chlorahexidine gluconate) soap before surgery.  CHG is an antiseptic cleaner which kills germs and bonds with the skin to  continue killing germs even after washing. Please DO NOT use if you have an allergy to CHG or antibacterial soaps.  If your skin becomes reddened/irritated stop using the CHG and inform your nurse when you arrive at Short Stay. Do not shave (including legs and underarms) for at least 48 hours prior to the first CHG shower.  You may shave your face/neck. Please follow these instructions carefully:  1.  Shower with CHG Soap the night before surgery and the  morning of Surgery.  2.  If you choose to wash your hair, wash your hair first as  usual with your  normal  shampoo.  3.  After you shampoo, rinse your hair and body thoroughly to remove the  shampoo.                           4.  Use CHG as you would any other liquid soap.  You can apply chg directly  to the skin and wash                       Gently with a scrungie or clean washcloth.  5.  Apply the CHG Soap to your body ONLY FROM THE NECK DOWN.   Do not use on face/ open                           Wound or open sores. Avoid contact with eyes, ears mouth and genitals (private parts).                       Wash face,  Genitals (private parts) with your normal soap.             6.  Wash thoroughly, paying special attention to the area where your surgery  will be performed.  7.  Thoroughly rinse your body with warm water from the neck down.  8.  DO NOT shower/wash with your normal soap after using and rinsing off  the CHG Soap.                9.  Pat yourself dry with a clean towel.            10.  Wear clean pajamas.            11.  Place clean sheets on your bed the night of your first shower and do not  sleep with pets. Day of Surgery : Do not apply any lotions/deodorants the morning of surgery.  Please wear clean clothes to the hospital/surgery center.  FAILURE TO FOLLOW THESE INSTRUCTIONS MAY RESULT IN THE CANCELLATION OF YOUR SURGERY PATIENT SIGNATURE_________________________________  NURSE  SIGNATURE__________________________________  ________________________________________________________________________

## 2019-11-18 ENCOUNTER — Encounter (HOSPITAL_COMMUNITY): Payer: Self-pay

## 2019-11-18 ENCOUNTER — Encounter (HOSPITAL_COMMUNITY)
Admission: RE | Admit: 2019-11-18 | Discharge: 2019-11-18 | Disposition: A | Discharge status: Home / Self Care | Payer: 59 | Source: Ambulatory Visit | Attending: Obstetrics & Gynecology | Admitting: Obstetrics & Gynecology

## 2019-11-18 ENCOUNTER — Other Ambulatory Visit: Payer: Self-pay

## 2019-11-18 DIAGNOSIS — Z01818 Encounter for other preprocedural examination: Secondary | ICD-10-CM | POA: Insufficient documentation

## 2019-11-18 HISTORY — DX: Anxiety disorder, unspecified: F41.9

## 2019-11-18 HISTORY — DX: Essential (primary) hypertension: I10

## 2019-11-18 HISTORY — DX: Prediabetes: R73.03

## 2019-11-18 NOTE — Progress Notes (Signed)
COVID Vaccine Completed:No Date COVID Vaccine completed: COVID vaccine manufacturer: [place]   PCP - Harland Dingwall. NP-C. LOV: 10/18/19 Cardiologist -   Chest x-ray -  EKG -  Stress Test -  ECHO -  Cardiac Cath -   Sleep Study -  CPAP -   Fasting Blood Sugar -  Checks Blood Sugar _____ times a day  Blood Thinner Instructions: Aspirin Instructions: Last Dose:  Anesthesia review:   Patient denies shortness of breath, fever, cough and chest pain at PAT appointment   Patient verbalized understanding of instructions that were given to them at the PAT appointment. Patient was also instructed that they will need to review over the PAT instructions again at home before surgery.

## 2019-11-19 ENCOUNTER — Encounter (HOSPITAL_COMMUNITY)
Admission: RE | Admit: 2019-11-19 | Discharge: 2019-11-19 | Disposition: A | Discharge status: Home / Self Care | Payer: 59 | Source: Ambulatory Visit | Attending: Obstetrics & Gynecology | Admitting: Obstetrics & Gynecology

## 2019-11-19 DIAGNOSIS — Z01818 Encounter for other preprocedural examination: Secondary | ICD-10-CM | POA: Diagnosis not present

## 2019-11-19 LAB — CBC
HCT: 40.1 % (ref 36.0–46.0)
Hemoglobin: 13.5 g/dL (ref 12.0–15.0)
MCH: 29.6 pg (ref 26.0–34.0)
MCHC: 33.7 g/dL (ref 30.0–36.0)
MCV: 87.9 fL (ref 80.0–100.0)
Platelets: 273 10*3/uL (ref 150–400)
RBC: 4.56 MIL/uL (ref 3.87–5.11)
RDW: 13.2 % (ref 11.5–15.5)
WBC: 5.6 10*3/uL (ref 4.0–10.5)
nRBC: 0 % (ref 0.0–0.2)

## 2019-11-19 LAB — BASIC METABOLIC PANEL
Anion gap: 9 (ref 5–15)
BUN: 13 mg/dL (ref 6–20)
CO2: 25 mmol/L (ref 22–32)
Calcium: 9.1 mg/dL (ref 8.9–10.3)
Chloride: 105 mmol/L (ref 98–111)
Creatinine, Ser: 1 mg/dL (ref 0.44–1.00)
GFR calc Af Amer: >60 mL/min (ref >60)
GFR calc non Af Amer: >60 mL/min (ref >60)
Glucose, Bld: 111 mg/dL — ABNORMAL HIGH (ref 70–99)
Potassium: 4 mmol/L (ref 3.5–5.1)
Sodium: 139 mmol/L (ref 135–145)

## 2019-11-19 LAB — ABO/RH: ABO/RH(D): O POS

## 2019-11-22 NOTE — Progress Notes (Signed)
Per blood bank T&S needs to be recollected the day of surgery.

## 2019-11-23 ENCOUNTER — Telehealth: Payer: Self-pay

## 2019-11-23 NOTE — Telephone Encounter (Signed)
I called patient to discuss Covid protocol changes. She is not vaccinated for Covid 19.  She has not been exposed to anyone with Covid at this point prior to surgery and she is asymptomatic.  I canceleld her Covid test for this upcoming Saturday but told her to call and report if any known exposure or symptoms.

## 2019-11-24 ENCOUNTER — Encounter: Payer: Self-pay | Admitting: Obstetrics & Gynecology

## 2019-11-24 ENCOUNTER — Telehealth (INDEPENDENT_AMBULATORY_CARE_PROVIDER_SITE_OTHER): Payer: 59 | Admitting: Obstetrics & Gynecology

## 2019-11-24 ENCOUNTER — Other Ambulatory Visit: Payer: Self-pay

## 2019-11-24 DIAGNOSIS — N803 Endometriosis of pelvic peritoneum: Secondary | ICD-10-CM

## 2019-11-24 DIAGNOSIS — R102 Pelvic and perineal pain: Secondary | ICD-10-CM | POA: Diagnosis not present

## 2019-11-24 DIAGNOSIS — N946 Dysmenorrhea, unspecified: Secondary | ICD-10-CM

## 2019-11-24 DIAGNOSIS — N857 Hematometra: Secondary | ICD-10-CM | POA: Diagnosis not present

## 2019-11-24 DIAGNOSIS — Z9889 Other specified postprocedural states: Secondary | ICD-10-CM

## 2019-11-24 DIAGNOSIS — IMO0002 Reserved for concepts with insufficient information to code with codable children: Secondary | ICD-10-CM

## 2019-11-24 NOTE — Progress Notes (Signed)
Penny Thomas 09-Nov-1985 [id number]        34 y.o.  G3P0004 Televisit for Preop XI Robotic TLH/Bilateral Salpingectomy.  Verbal informed consent obtained for televisit.  Patient well identified.  Preop counseling between patient and her home and myself at my [place] office.  Counseling for 20 minutes.  RP: Preop XI Robotic TLH/Bilateral Salpingectomy.    HPI: No change since last visit on 10/22/2019 when we noted: Started on the Progestin pill 02/2019 which helped for a couple of months, but then the pain/dysmenorrhea worsened again so patient stopped the pill.  Insurance didn't cover DepoLupron.  S/P Endometrial Ablation. Severe monthly pelvic pains/cramps x about 5 days with persistent lower intensity pain continuously. Deep pain with IC. Pelvic US suggestive of Hematometra 02/2019. H/O Endometriosis, stopped [name] because of hallucinations at night.   OB History  Gravida Para Term Preterm AB Living  3 3     0 4  SAB TAB Ectopic Multiple Live Births      0 1 4    # Outcome Date GA Lbr Len/2nd Weight Sex Delivery Anes PTL Lv  3 Para 01/20/09 [redacted]w[redacted]d    CS-Classical   LIV  2A Para 09/28/05 [redacted]w[redacted]d    Vag-Spont   LIV     Birth Comments: twins  2B Para 09/28/05 [redacted]w[redacted]d    CS-Classical   LIV  1 Para 07/03/04 [redacted]w[redacted]d    Vag-Spont   LIV    Past medical history,surgical history, problem list, medications, allergies, family history and social history were all reviewed and documented in the EPIC chart.   Directed ROS with pertinent positives and negatives documented in the history of present illness/assessment and plan.  Exam:  There were no vitals filed for this visit. General appearance:  Normal  Televisit  Pelvic US 02/2019:  T/V images.  Anteverted uterus normal in size and shape with no myometrial mass, the uterus measures 7.72 x 4.04 x 3.63 cm.  Irregular fluid collection measuring 5.5 cm in left cornual area extending into the proximal aspect of the left tube.  Part of the  fluid collection shows debris measuring 2.1 x 1 cm.  The endometrium is irregular, but not thickened, probably secondary to endometrial ablation.  Both ovaries are normal in size with normal follicular pattern.  A dominant follicle is present on the left ovary measuring 2.7 cm.  No adnexal mass.  No free fluid in the posterior cul-de-sac.   Assessment/Plan:  34 y.o. G3P0004   1. Pelvic pain in female Persistent severe pelvic pain with dysmenorrhea and dyspareunia.  Recurrence of pain even on the progestin pill.  History of endometrial ablation and endometriosis.  Had to stop her release up because of elucidation's.  Pelvic ultrasound suggestive of hematometra in October 2020.  Pelvic ultrasound today still showing an irregular fluid collection extending to the left tube.  Both ovaries are normal in size.  No adnexal mass.  Decision to proceed with X I total laparoscopic hysterectomy with bilateral salpingectomy.  Preop preparation, surgery and risks thoroughly reviewed.  Postop precautions and expectations discussed.  Patient voiced understanding and agreement with plan.  2. Dysmenorrhea As above.  3. Hematometra As above.  4. S/P endometrial ablation  5. Endometriosis of pelvis                        Patient was counseled as to the risk of surgery to include the following:  1. Infection (prohylactic antibiotics will be administered)  2. DVT/Pulmonary Embolism (prophylactic pneumo compression stockings will be used)  3.Trauma to internal organs requiring additional surgical procedure to repair any injury to internal organs requiring perhaps additional hospitalization days.  4.Hemmorhage requiring transfusion and blood products which carry risks such as  anaphylactic reaction, hepatitis and AIDS  Patient had received literature information on the procedure scheduled and all her questions were answered and fully accepts all risk.    [name] MD, 12:35 PM 11/24/2019

## 2019-11-25 ENCOUNTER — Telehealth: Payer: Self-pay

## 2019-11-25 NOTE — Telephone Encounter (Signed)
Patient called back in my voice mail and confirmed she got the message and will go Saturday for Covid testing.

## 2019-11-25 NOTE — Telephone Encounter (Signed)
I called patient and left detailed message in voice mail per [facility] access note on file.  I apologized to her for some confusion that has occurred with Covid-19 protocol changes. I was informed she would not need test on handout received 2 days ago but today Preadmitting nurse called and said she will need a test. I let her know I rescheduled her back in her same spot on 7/2 at 11:30am.  I asked her to call me back and let me know she got my message and understood Covid test is needed pre op.

## 2019-11-27 ENCOUNTER — Encounter: Payer: Self-pay | Admitting: Obstetrics & Gynecology

## 2019-11-27 ENCOUNTER — Other Ambulatory Visit (HOSPITAL_COMMUNITY)
Admission: RE | Admit: 2019-11-27 | Discharge: 2019-11-27 | Disposition: A | Discharge status: Home / Self Care | Payer: 59 | Source: Ambulatory Visit | Attending: Obstetrics & Gynecology | Admitting: Obstetrics & Gynecology

## 2019-11-27 ENCOUNTER — Other Ambulatory Visit (HOSPITAL_COMMUNITY): Payer: 59

## 2019-11-27 DIAGNOSIS — Z20822 Contact with and (suspected) exposure to covid-19: Secondary | ICD-10-CM | POA: Insufficient documentation

## 2019-11-27 DIAGNOSIS — Z01812 Encounter for preprocedural laboratory examination: Secondary | ICD-10-CM | POA: Diagnosis not present

## 2019-11-27 LAB — SARS CORONAVIRUS 2 (TAT 6-24 HRS): SARS Coronavirus 2: NEGATIVE

## 2019-11-27 NOTE — Patient Instructions (Signed)
1. Pelvic pain in female Persistent severe pelvic pain with dysmenorrhea and dyspareunia.  Recurrence of pain even on the progestin pill.  History of endometrial ablation and endometriosis.  Had to stop her release up because of elucidation's.  Pelvic ultrasound suggestive of hematometra in October 2020.  Pelvic ultrasound today still showing an irregular fluid collection extending to the left tube.  Both ovaries are normal in size.  No adnexal mass.  Decision to proceed with X I total laparoscopic hysterectomy with bilateral salpingectomy.  Preop preparation, surgery and risks thoroughly reviewed.  Postop precautions and expectations discussed.  Patient voiced understanding and agreement with plan.  2. Dysmenorrhea As above.  3. Hematometra As above.  4. S/P endometrial ablation  5. Endometriosis of pelvis                        Patient was counseled as to the risk of surgery to include the following:  1. Infection (prohylactic antibiotics will be administered)  2. DVT/Pulmonary Embolism (prophylactic pneumo compression stockings will be used)  3.Trauma to internal organs requiring additional surgical procedure to repair any injury to internal organs requiring perhaps additional hospitalization days.  4.Hemmorhage requiring transfusion and blood products which carry risks such as  anaphylactic reaction, hepatitis and AIDS  Patient had received literature information on the procedure scheduled and all her questions were answered and fully accepts all risk.  [name], it was a pleasure seeing you today!

## 2019-11-30 NOTE — Anesthesia Preprocedure Evaluation (Addendum)
Anesthesia Evaluation  Patient identified by MRN, date of birth, ID band Patient awake    Reviewed: Allergy & Precautions, NPO status , Patient's Chart, lab work & pertinent test results  History of Anesthesia Complications Negative for: history of anesthetic complications  Airway Mallampati: II  TM Distance: >3 FB Neck ROM: Full    Dental  (+) Dental Advisory Given, Teeth Intact   Pulmonary asthma ,    Pulmonary exam normal        Cardiovascular hypertension, Pt. on home beta blockers and Pt. on medications Normal cardiovascular exam     Neuro/Psych  Headaches, PSYCHIATRIC DISORDERS Anxiety    GI/Hepatic negative GI ROS, Neg liver ROS,   Endo/Other   Pre-DM   Renal/GU negative Renal ROS     Musculoskeletal  (+) Arthritis ,   Abdominal   Peds  Hematology  (+) Sickle cell trait ,   Anesthesia Other Findings Covid test negative   Reproductive/Obstetrics                            Anesthesia Physical Anesthesia Plan  ASA: II  Anesthesia Plan: General   Post-op Pain Management:    Induction: Intravenous  PONV Risk Score and Plan: 4 or greater and Treatment may vary due to age or medical condition, Ondansetron, Scopolamine patch - Pre-op, Midazolam and Dexamethasone  Airway Management Planned: Oral ETT  Additional Equipment: None  Intra-op Plan:   Post-operative Plan: Extubation in OR  Informed Consent: I have reviewed the patients History and Physical, chart, labs and discussed the procedure including the risks, benefits and alternatives for the proposed anesthesia with the patient or authorized representative who has indicated his/her understanding and acceptance.     Dental advisory given  Plan Discussed with: CRNA and Anesthesiologist  Anesthesia Plan Comments:        Anesthesia Quick Evaluation

## 2019-12-01 ENCOUNTER — Ambulatory Visit (HOSPITAL_BASED_OUTPATIENT_CLINIC_OR_DEPARTMENT_OTHER)
Admission: RE | Admit: 2019-12-01 | Discharge: 2019-12-01 | Disposition: A | Discharge status: Home / Self Care | Payer: 59 | Source: Ambulatory Visit | Attending: Obstetrics & Gynecology | Admitting: Obstetrics & Gynecology

## 2019-12-01 ENCOUNTER — Encounter (HOSPITAL_BASED_OUTPATIENT_CLINIC_OR_DEPARTMENT_OTHER)
Admission: RE | Disposition: A | Discharge status: Home / Self Care | Payer: Self-pay | Source: Ambulatory Visit | Attending: Obstetrics & Gynecology

## 2019-12-01 ENCOUNTER — Ambulatory Visit (HOSPITAL_BASED_OUTPATIENT_CLINIC_OR_DEPARTMENT_OTHER): Payer: 59 | Admitting: Anesthesiology

## 2019-12-01 ENCOUNTER — Other Ambulatory Visit: Payer: Self-pay

## 2019-12-01 ENCOUNTER — Encounter (HOSPITAL_BASED_OUTPATIENT_CLINIC_OR_DEPARTMENT_OTHER): Payer: Self-pay | Admitting: Obstetrics & Gynecology

## 2019-12-01 DIAGNOSIS — Z79899 Other long term (current) drug therapy: Secondary | ICD-10-CM | POA: Insufficient documentation

## 2019-12-01 DIAGNOSIS — Z90710 Acquired absence of both cervix and uterus: Secondary | ICD-10-CM | POA: Insufficient documentation

## 2019-12-01 DIAGNOSIS — F419 Anxiety disorder, unspecified: Secondary | ICD-10-CM | POA: Diagnosis not present

## 2019-12-01 DIAGNOSIS — G43909 Migraine, unspecified, not intractable, without status migrainosus: Secondary | ICD-10-CM | POA: Diagnosis not present

## 2019-12-01 DIAGNOSIS — N946 Dysmenorrhea, unspecified: Secondary | ICD-10-CM | POA: Diagnosis not present

## 2019-12-01 DIAGNOSIS — M1990 Unspecified osteoarthritis, unspecified site: Secondary | ICD-10-CM | POA: Insufficient documentation

## 2019-12-01 DIAGNOSIS — I10 Essential (primary) hypertension: Secondary | ICD-10-CM | POA: Insufficient documentation

## 2019-12-01 DIAGNOSIS — N736 Female pelvic peritoneal adhesions (postinfective): Secondary | ICD-10-CM | POA: Insufficient documentation

## 2019-12-01 DIAGNOSIS — Z9889 Other specified postprocedural states: Secondary | ICD-10-CM

## 2019-12-01 DIAGNOSIS — N801 Endometriosis of ovary: Secondary | ICD-10-CM | POA: Diagnosis not present

## 2019-12-01 DIAGNOSIS — Z888 Allergy status to other drugs, medicaments and biological substances status: Secondary | ICD-10-CM | POA: Diagnosis not present

## 2019-12-01 DIAGNOSIS — R7303 Prediabetes: Secondary | ICD-10-CM | POA: Insufficient documentation

## 2019-12-01 DIAGNOSIS — D259 Leiomyoma of uterus, unspecified: Secondary | ICD-10-CM | POA: Diagnosis not present

## 2019-12-01 DIAGNOSIS — N857 Hematometra: Secondary | ICD-10-CM | POA: Diagnosis not present

## 2019-12-01 DIAGNOSIS — Z90711 Acquired absence of uterus with remaining cervical stump: Secondary | ICD-10-CM | POA: Insufficient documentation

## 2019-12-01 DIAGNOSIS — K5651 Intestinal adhesions [bands], with partial obstruction: Secondary | ICD-10-CM | POA: Diagnosis not present

## 2019-12-01 DIAGNOSIS — J45909 Unspecified asthma, uncomplicated: Secondary | ICD-10-CM | POA: Diagnosis not present

## 2019-12-01 DIAGNOSIS — D252 Subserosal leiomyoma of uterus: Secondary | ICD-10-CM | POA: Diagnosis not present

## 2019-12-01 DIAGNOSIS — Z793 Long term (current) use of hormonal contraceptives: Secondary | ICD-10-CM | POA: Diagnosis not present

## 2019-12-01 DIAGNOSIS — Z9079 Acquired absence of other genital organ(s): Secondary | ICD-10-CM | POA: Diagnosis not present

## 2019-12-01 DIAGNOSIS — N809 Endometriosis, unspecified: Secondary | ICD-10-CM | POA: Diagnosis not present

## 2019-12-01 DIAGNOSIS — R69 Illness, unspecified: Secondary | ICD-10-CM | POA: Diagnosis not present

## 2019-12-01 HISTORY — DX: Other specified postprocedural states: Z98.890

## 2019-12-01 HISTORY — PX: ROBOTIC ASSISTED LAPAROSCOPIC HYSTERECTOMY AND SALPINGECTOMY: SHX6379

## 2019-12-01 HISTORY — DX: Acquired absence of uterus with remaining cervical stump: Z90.711

## 2019-12-01 LAB — CBC
HCT: 37.8 % (ref 36.0–46.0)
Hemoglobin: 13.2 g/dL (ref 12.0–15.0)
MCH: 29.8 pg (ref 26.0–34.0)
MCHC: 34.9 g/dL (ref 30.0–36.0)
MCV: 85.3 fL (ref 80.0–100.0)
Platelets: 227 10*3/uL (ref 150–400)
RBC: 4.43 MIL/uL (ref 3.87–5.11)
RDW: 12.8 % (ref 11.5–15.5)
WBC: 9.5 10*3/uL (ref 4.0–10.5)
nRBC: 0 % (ref 0.0–0.2)

## 2019-12-01 LAB — POCT PREGNANCY, URINE: Preg Test, Ur: NEGATIVE

## 2019-12-01 LAB — TYPE AND SCREEN
ABO/RH(D): O POS
Antibody Screen: POSITIVE

## 2019-12-01 SURGERY — XI ROBOTIC ASSISTED LAPAROSCOPIC HYSTERECTOMY AND SALPINGECTOMY
Anesthesia: General | Site: Abdomen | Laterality: Bilateral

## 2019-12-01 MED ORDER — HYDROCODONE-ACETAMINOPHEN 5-325 MG PO TABS: 1.0000 | ORAL_TABLET | ORAL | Status: DC | PRN

## 2019-12-01 MED ORDER — HYDROMORPHONE HCL 1 MG/ML IJ SOLN
0.5000 mg | INTRAMUSCULAR | Status: DC | PRN
  Administered 2019-12-01 (×2): 0.5 mg via INTRAVENOUS

## 2019-12-01 MED ORDER — OXYCODONE-ACETAMINOPHEN 5-325 MG PO TABS: 2.0000 | ORAL_TABLET | ORAL | Status: DC | PRN

## 2019-12-01 MED ORDER — LACTATED RINGERS IV SOLN: INTRAVENOUS | Status: DC

## 2019-12-01 MED ORDER — FENTANYL CITRATE (PF) 100 MCG/2ML IJ SOLN
25.0000 ug | INTRAMUSCULAR | Status: DC | PRN
  Administered 2019-12-01 (×2): 50 ug via INTRAVENOUS

## 2019-12-01 MED ORDER — OXYCODONE-ACETAMINOPHEN 7.5-325 MG PO TABS: 1.0000 | ORAL_TABLET | Freq: Four times a day (QID) | ORAL | 0 refills | Status: AC | PRN

## 2019-12-01 MED ORDER — MIDAZOLAM HCL 2 MG/2ML IJ SOLN
INTRAMUSCULAR | Status: AC
  Filled 2019-12-01: qty 2

## 2019-12-01 MED ORDER — OXYCODONE HCL 5 MG PO TABS
ORAL_TABLET | ORAL | Status: AC
  Filled 2019-12-01: qty 1

## 2019-12-01 MED ORDER — FENTANYL CITRATE (PF) 250 MCG/5ML IJ SOLN
INTRAMUSCULAR | Status: DC | PRN
  Administered 2019-12-01: 50 ug via INTRAVENOUS
  Administered 2019-12-01: 150 ug via INTRAVENOUS
  Administered 2019-12-01: 50 ug via INTRAVENOUS

## 2019-12-01 MED ORDER — PHENYLEPHRINE 40 MCG/ML (10ML) SYRINGE FOR IV PUSH (FOR BLOOD PRESSURE SUPPORT)
PREFILLED_SYRINGE | INTRAVENOUS | Status: AC
  Filled 2019-12-01: qty 10

## 2019-12-01 MED ORDER — KETOROLAC TROMETHAMINE 30 MG/ML IJ SOLN
INTRAMUSCULAR | Status: DC | PRN
  Administered 2019-12-01: 30 mg via INTRAVENOUS

## 2019-12-01 MED ORDER — CEFAZOLIN SODIUM-DEXTROSE 2-4 GM/100ML-% IV SOLN
INTRAVENOUS | Status: AC
  Filled 2019-12-01: qty 100

## 2019-12-01 MED ORDER — HYDROMORPHONE HCL 1 MG/ML IJ SOLN
INTRAMUSCULAR | Status: AC
  Filled 2019-12-01: qty 1

## 2019-12-01 MED ORDER — OXYCODONE HCL 5 MG PO TABS
5.0000 mg | ORAL_TABLET | Freq: Once | ORAL | Status: AC | PRN
  Administered 2019-12-01: 5 mg via ORAL

## 2019-12-01 MED ORDER — SCOPOLAMINE 1 MG/3DAYS TD PT72
MEDICATED_PATCH | TRANSDERMAL | Status: AC
  Filled 2019-12-01: qty 1

## 2019-12-01 MED ORDER — BUPIVACAINE HCL (PF) 0.25 % IJ SOLN
INTRAMUSCULAR | Status: DC | PRN
  Administered 2019-12-01: 15 mL

## 2019-12-01 MED ORDER — PROPOFOL 10 MG/ML IV BOLUS
INTRAVENOUS | Status: DC | PRN
  Administered 2019-12-01: 200 mg via INTRAVENOUS

## 2019-12-01 MED ORDER — DEXAMETHASONE SODIUM PHOSPHATE 10 MG/ML IJ SOLN
INTRAMUSCULAR | Status: DC | PRN
  Administered 2019-12-01: 5 mg via INTRAVENOUS

## 2019-12-01 MED ORDER — ONDANSETRON HCL 4 MG/2ML IJ SOLN
INTRAMUSCULAR | Status: DC | PRN
  Administered 2019-12-01: 4 mg via INTRAVENOUS

## 2019-12-01 MED ORDER — PROMETHAZINE HCL 25 MG/ML IJ SOLN: 6.2500 mg | INTRAMUSCULAR | Status: DC | PRN

## 2019-12-01 MED ORDER — CEFAZOLIN SODIUM-DEXTROSE 2-4 GM/100ML-% IV SOLN
2.0000 g | INTRAVENOUS | Status: AC
  Administered 2019-12-01: 2 g via INTRAVENOUS

## 2019-12-01 MED ORDER — ROCURONIUM BROMIDE 10 MG/ML (PF) SYRINGE
PREFILLED_SYRINGE | INTRAVENOUS | Status: AC
  Filled 2019-12-01: qty 10

## 2019-12-01 MED ORDER — ACETAMINOPHEN 10 MG/ML IV SOLN
1000.0000 mg | Freq: Four times a day (QID) | INTRAVENOUS | Status: DC
  Administered 2019-12-01: 1000 mg via INTRAVENOUS

## 2019-12-01 MED ORDER — SUGAMMADEX SODIUM 200 MG/2ML IV SOLN
INTRAVENOUS | Status: DC | PRN
  Administered 2019-12-01: 170 mg via INTRAVENOUS

## 2019-12-01 MED ORDER — PROPOFOL 10 MG/ML IV BOLUS
INTRAVENOUS | Status: AC
  Filled 2019-12-01: qty 40

## 2019-12-01 MED ORDER — MIDAZOLAM HCL 2 MG/2ML IJ SOLN
INTRAMUSCULAR | Status: DC | PRN
  Administered 2019-12-01: 2 mg via INTRAVENOUS

## 2019-12-01 MED ORDER — CHLORHEXIDINE GLUCONATE 0.12 % MT SOLN: 15.0000 mL | Freq: Once | OROMUCOSAL | Status: DC

## 2019-12-01 MED ORDER — FENTANYL CITRATE (PF) 250 MCG/5ML IJ SOLN
INTRAMUSCULAR | Status: AC
  Filled 2019-12-01: qty 5

## 2019-12-01 MED ORDER — OXYCODONE HCL 5 MG PO TABS
5.0000 mg | ORAL_TABLET | ORAL | Status: DC | PRN
  Administered 2019-12-01: 5 mg via ORAL

## 2019-12-01 MED ORDER — ONDANSETRON HCL 4 MG/2ML IJ SOLN
INTRAMUSCULAR | Status: AC
  Filled 2019-12-01: qty 2

## 2019-12-01 MED ORDER — SCOPOLAMINE 1 MG/3DAYS TD PT72
MEDICATED_PATCH | TRANSDERMAL | Status: DC | PRN
  Administered 2019-12-01: 1 via TRANSDERMAL

## 2019-12-01 MED ORDER — OXYCODONE HCL 5 MG/5ML PO SOLN: 5.0000 mg | Freq: Once | ORAL | Status: AC | PRN

## 2019-12-01 MED ORDER — ORAL CARE MOUTH RINSE: 15.0000 mL | Freq: Once | OROMUCOSAL | Status: DC

## 2019-12-01 MED ORDER — KETOROLAC TROMETHAMINE 30 MG/ML IJ SOLN
INTRAMUSCULAR | Status: AC
  Filled 2019-12-01: qty 1

## 2019-12-01 MED ORDER — LIDOCAINE 2% (20 MG/ML) 5 ML SYRINGE
INTRAMUSCULAR | Status: AC
  Filled 2019-12-01: qty 5

## 2019-12-01 MED ORDER — ACETAMINOPHEN 325 MG PO TABS: 650.0000 mg | ORAL_TABLET | ORAL | Status: DC | PRN

## 2019-12-01 MED ORDER — ROCURONIUM BROMIDE 10 MG/ML (PF) SYRINGE
PREFILLED_SYRINGE | INTRAVENOUS | Status: DC | PRN
  Administered 2019-12-01: 20 mg via INTRAVENOUS
  Administered 2019-12-01: 70 mg via INTRAVENOUS

## 2019-12-01 MED ORDER — ACETAMINOPHEN 10 MG/ML IV SOLN
INTRAVENOUS | Status: AC
  Filled 2019-12-01: qty 100

## 2019-12-01 MED ORDER — LIDOCAINE HCL (CARDIAC) PF 100 MG/5ML IV SOSY
PREFILLED_SYRINGE | INTRAVENOUS | Status: DC | PRN
  Administered 2019-12-01: 60 mg via INTRAVENOUS

## 2019-12-01 SURGICAL SUPPLY — 65 items
ADH SKN CLS APL DERMABOND .7 (GAUZE/BANDAGES/DRESSINGS) ×1
BARRIER ADHS 3X4 INTERCEED (GAUZE/BANDAGES/DRESSINGS) IMPLANT
BLADE 11 SAFETY STRL DISP (BLADE) ×2 IMPLANT
BRR ADH 4X3 ABS CNTRL BYND (GAUZE/BANDAGES/DRESSINGS)
CANISTER SUCT 3000ML PPV (MISCELLANEOUS) ×2 IMPLANT
CATH FOLEY 3WAY  5CC 16FR (CATHETERS) ×2
CATH FOLEY 3WAY 5CC 16FR (CATHETERS) ×1 IMPLANT
COVER BACK TABLE 60X90IN (DRAPES) ×2 IMPLANT
COVER TIP SHEARS 8 DVNC (MISCELLANEOUS) ×1 IMPLANT
COVER TIP SHEARS 8MM DA VINCI (MISCELLANEOUS) ×2
COVER WAND RF STERILE (DRAPES) ×2 IMPLANT
DECANTER SPIKE VIAL GLASS SM (MISCELLANEOUS) IMPLANT
DEFOGGER SCOPE WARMER CLEARIFY (MISCELLANEOUS) IMPLANT
DERMABOND ADVANCED (GAUZE/BANDAGES/DRESSINGS) ×1
DERMABOND ADVANCED .7 DNX12 (GAUZE/BANDAGES/DRESSINGS) ×1 IMPLANT
DRAPE ARM DVNC X/XI (DISPOSABLE) ×4 IMPLANT
DRAPE COLUMN DVNC XI (DISPOSABLE) ×1 IMPLANT
DRAPE DA VINCI XI ARM (DISPOSABLE) ×8
DRAPE DA VINCI XI COLUMN (DISPOSABLE) ×2
DRAPE UTILITY XL STRL (DRAPES) ×2 IMPLANT
DURAPREP 26ML APPLICATOR (WOUND CARE) ×2 IMPLANT
ELECT REM PT RETURN 9FT ADLT (ELECTROSURGICAL) ×2
ELECTRODE REM PT RTRN 9FT ADLT (ELECTROSURGICAL) ×1 IMPLANT
GAUZE PETROLATUM 1 X8 (GAUZE/BANDAGES/DRESSINGS) IMPLANT
GLOVE BIO SURGEON STRL SZ 6.5 (GLOVE) ×6 IMPLANT
GLOVE BIO SURGEON STRL SZ8 (GLOVE) ×1 IMPLANT
GLOVE BIOGEL PI IND STRL 7.0 (GLOVE) ×5 IMPLANT
GLOVE BIOGEL PI IND STRL 8 (GLOVE) IMPLANT
GLOVE BIOGEL PI INDICATOR 7.0 (GLOVE) ×5
GLOVE BIOGEL PI INDICATOR 8 (GLOVE) ×1
GOWN STRL REUS W/TWL XL LVL3 (GOWN DISPOSABLE) ×1 IMPLANT
IRRIG SUCT STRYKERFLOW 2 WTIP (MISCELLANEOUS) ×2
IRRIGATION SUCT STRKRFLW 2 WTP (MISCELLANEOUS) ×1 IMPLANT
LEGGING LITHOTOMY PAIR STRL (DRAPES) ×2 IMPLANT
OBTURATOR OPTICAL STANDARD 8MM (TROCAR) ×2
OBTURATOR OPTICAL STND 8 DVNC (TROCAR) ×1
OBTURATOR OPTICALSTD 8 DVNC (TROCAR) ×1 IMPLANT
OCCLUDER COLPOPNEUMO (BALLOONS) ×2 IMPLANT
PACK ROBOT WH (CUSTOM PROCEDURE TRAY) ×2 IMPLANT
PACK ROBOTIC GOWN (GOWN DISPOSABLE) ×2 IMPLANT
PACK TRENDGUARD 450 HYBRID PRO (MISCELLANEOUS) ×1 IMPLANT
PAD PREP 24X48 CUFFED NSTRL (MISCELLANEOUS) ×2 IMPLANT
POUCH ENDO CATCH II 15MM (MISCELLANEOUS) IMPLANT
PROTECTOR NERVE ULNAR (MISCELLANEOUS) ×4 IMPLANT
RTRCTR WOUND ALEXIS 18CM SML (INSTRUMENTS)
SAVER CELL AAL HAEMONETICS (INSTRUMENTS) IMPLANT
SEAL CANN UNIV 5-8 DVNC XI (MISCELLANEOUS) ×4 IMPLANT
SEAL XI 5MM-8MM UNIVERSAL (MISCELLANEOUS) ×8
SET IRRIG Y TYPE TUR BLADDER L (SET/KITS/TRAYS/PACK) IMPLANT
SET TRI-LUMEN FLTR TB AIRSEAL (TUBING) ×2 IMPLANT
SUT ETHIBOND 0 (SUTURE) IMPLANT
SUT VIC AB 4-0 PS2 27 (SUTURE) ×6 IMPLANT
SUT VICRYL 0 UR6 27IN ABS (SUTURE) ×2 IMPLANT
SUT VLOC 180 0 9IN  GS21 (SUTURE) ×2
SUT VLOC 180 0 9IN GS21 (SUTURE) ×1 IMPLANT
SUT VLOC 180 2-0 6IN GS21 (SUTURE) IMPLANT
TIP RUMI ORANGE 6.7MMX12CM (TIP) IMPLANT
TIP UTERINE 5.1X6CM LAV DISP (MISCELLANEOUS) IMPLANT
TIP UTERINE 6.7X10CM GRN DISP (MISCELLANEOUS) IMPLANT
TIP UTERINE 6.7X6CM WHT DISP (MISCELLANEOUS) IMPLANT
TIP UTERINE 6.7X8CM BLUE DISP (MISCELLANEOUS) ×2 IMPLANT
TOWEL OR 17X26 10 PK STRL BLUE (TOWEL DISPOSABLE) ×2 IMPLANT
TRENDGUARD 450 HYBRID PRO PACK (MISCELLANEOUS) ×2
TROCAR PORT AIRSEAL 5X120 (TROCAR) ×2 IMPLANT
WATER STERILE IRR 1000ML POUR (IV SOLUTION) ×2 IMPLANT

## 2019-12-01 NOTE — Op Note (Addendum)
Operative Note  12/01/2019  11:34 AM  PATIENT:  Penny Thomas  34 y.o. female  PRE-OPERATIVE DIAGNOSIS:  Severe dysmenorrhea, hematometra post endometrial ablation, history of endometriosis  POST-OPERATIVE DIAGNOSIS:  Severe dysmenorrhea, hematometra post endometrial ablation, history of endometriosis, moderate endometriosis with severe adhesions, absence of left tube  PROCEDURE:  Procedure(s): XI ROBOTIC ASSISTED TOTAL LAPAROSCOPIC HYSTERECTOMY AND RIGHT SALPINGECTOMY, EXTENSIVE LYSIS OF ADHESIONS  SURGEON:  Surgeon(s): [name], MD Joseph Pierini, MD  First assistant:  A qualified surgical assistant was required as an active participant in this case because skilled help was needed in, retraction of the operative site, safe exposure of anatomy, cutting suture materials, and assistance with obtaining hemostasis with irrigation and suction. The surgical assistant was actively involved during the entire surgery.    ANESTHESIA:   general  FINDINGS: Uterus with fibroid, endometrioma at the left cornua, absence of left tube, right tubal adhesions, right and left ovary normal.  Left intestino-ovarian adhesions.  Severe adhesions between bladder and lower uterine segment.  DESCRIPTION OF OPERATION: Under general anesthesia with endotracheal intubation the patient is in lithotomy position.  She is prepped with DuraPrep on the abdomen and Betadine on the suprapubic, vulvar and vaginal areas.  She is draped as usual.  Timeout is done.  The Foley is inserted in the bladder.  The vaginal exam reveals an anteverted uterus, normal volume, mobile.  No adnexal mass felt.  The weighted speculum is inserted in the vagina and the anterior lip of the cervix is grasped with a tenaculum.  The hysterometry is at 8 cm.  Dilation of the cervix done.  A #8 Rumi with a medium Koh ring are put in place without difficulty.  The other instruments are removed.  We go to the abdomen.  The supraumbilical  area is infiltrated with Marcaine one quarter plain.  A 1.5 cm incision is made with the scalpel at that level.  The aponeurosis is grasped with cokers and opened with Mayo scissors.  The parietal peritoneum is opened bluntly with the finger.  A pursestring stitch of Vicryl 0 is done at the aponeurosis.  The Sheryle Hail is inserted under direct vision and up pneumoperitoneum is created with CO2.  The camera is inserted at that level.  The anterior wall of the abdomen is free of adhesions where ports will be inserted.  The uterus is within normal size with 1 subserosal fibroid present.  The right tube is adherent to the right pelvic wall.  The left tube is absent and an endometrioma is present at the left cornua of the uterus.  Both ovaries are normal in size and appearance.  Left ovarian intestinal adhesions are present.  The skin is marked for port placement.  The skin is infiltrated at each level with Marcaine one quarter plain.  Small incisions are made with the scalpel.  All ports are inserted under direct vision with 2 robotic ports on the right side in line with the umbilicus and 1 robotic ports on the left distal side in line with the umbilicus and an 8 mm assistant ports on the left medial side slightly above the umbilical line.  The patient is positioned in deep Trendelenburg at 30 degree which she tolerates well.  The robot is docked starting with targeting.  Robotic instruments are inserted under direct vision with the fenestrated clamp in the fourth arm, the scissors in the third arm, the camera in the second arm and the long bipolar in the first arm.  We  go to the console.      Both ureters are seen in normal anatomic position with good peristalsis.  We started on the right side with lysis of adhesions to free the right tube.  We then cauterized and section the right utero-ovarian ligament.  We cauterized and section the right round ligament.  We opened the broad ligament on the right and stop just before  the right uterine artery.  On the left side we partially free the ovary from the intestinal adhesions to release any tension.  The left tube is absent.  A large endometrioma is present at the left uterine cornua.  We cauterized and section the left utero-ovarian ligament.  We cauterized and sectioned the left round ligament.  We opened the left broad ligament and stopped just before the left uterine artery.  The bladder is severely adherent to the lower uterine segment.  We meticulously lysed those adhesions very close to the uterus side.  We successfully descend the bladder past the [facility] ring.  We then cauterized and section the left uterine artery.  We cauterized and section the right uterine artery.  The vaginal occluder is filled up.  The vagina is opened on top of the Koh ring with the tip of the scissors starting posteriorly than anteriorly and finishing on the sides.  The uterus is completely detached with the cervix and right tube.  It is easily passed vaginally.  The specimen is sent to pathology.  The vaginal occluder is put back in place in the vagina.  We irrigate and suction the pelvic cavity.  We verify hemostasis and it is adequate on all pedicles and at the vaginal vault.  The instruments are changed to the fenestrated clamp with power in the fourth arm, the cutting needle driver in the third arm, the long tip in the first arm.  The V lock 0 at 9 inches was inserted through arm 1 before inserting the long tip.  We closed the vaginal vault with a running suture of V-Loc 0 starting at the right angle all the way to the left angle and then back to the middle.  Hemostasis is adequate at all levels.  The vaginal vault is very well closed.  The suture is removed from the pelvis.  Pictures were taken at the beginning of the surgery and at the end.  All robotic instruments are removed.  The robot is undocked.  We go by laparoscopy.  We confirmed hemostasis once more.  The Trendelenburg is removed and no  further fluid needs to be suctioned.  The ports are removed under direct vision.  The CO2 is evacuated.  The pursestring stitch is closed at the supraumbilical incision to close the aponeurosis.  We closed the skin with Vicryl 4-0 with a subcuticular stitch at all incisions.  Dermabond is added on all incisions.  The vaginal occluder is removed.  Hemostasis is adequate at that level.  The patient is brought to recovery room in good and stable status.  Extensive lysis of adhesions performed with increased the duration of the case by 30 minutes.   ESTIMATED BLOOD LOSS: 20 mL   Intake/Output Summary (Last 24 hours) at 12/01/2019 1134 Last data filed at 12/01/2019 1128 Gross per 24 hour  Intake 1100 ml  Output 20 ml  Net 1080 ml     BLOOD ADMINISTERED:none   LOCAL MEDICATIONS USED:  MARCAINE     SPECIMEN:  Source of Specimen:  Uterus with cervix and right tube  DISPOSITION  OF SPECIMEN:  PATHOLOGY  COUNTS:  YES  PLAN OF CARE: Transfer to PACU  Marie-Lyne LavoieMD11:34 AM

## 2019-12-01 NOTE — Anesthesia Postprocedure Evaluation (Signed)
Anesthesia Post Note  Patient: Penny Thomas  Procedure(s) Performed: XI ROBOTIC ASSISTEDTOTAL TOTAL LAPAROSCOPIC HYSTERECTOMY AND RIGHT SALPINGECTOMY , EXTENSIVE LYSIS OF ADHESIONS (Bilateral Abdomen)     Patient location during evaluation: PACU Anesthesia Type: General Level of consciousness: awake and alert Pain management: pain level controlled Vital Signs Assessment: post-procedure vital signs reviewed and stable Respiratory status: spontaneous breathing, nonlabored ventilation and respiratory function stable Cardiovascular status: blood pressure returned to baseline and stable Postop Assessment: no apparent nausea or vomiting Anesthetic complications: no   No complications documented.  Last Vitals:  Vitals:   12/01/19 1245 12/01/19 1330  BP:  138/78  Pulse:  88  Resp: 14 12  Temp:  36.8 C  SpO2:  100%                   Audry Pili

## 2019-12-01 NOTE — Anesthesia Procedure Notes (Signed)
Procedure Name: Intubation Date/Time: 12/01/2019 8:32 AM Performed by: Raenette Rover, CRNA Pre-anesthesia Checklist: Patient identified, Emergency Drugs available, Suction available and Patient being monitored Patient Re-evaluated:Patient Re-evaluated prior to induction Oxygen Delivery Method: Circle system utilized Preoxygenation: Pre-oxygenation with 100% oxygen Induction Type: IV induction Ventilation: Mask ventilation without difficulty Laryngoscope Size: Mac and 3 Grade View: Grade I Tube type: Oral Tube size: 7.0 mm Number of attempts: 1 Airway Equipment and Method: Stylet Placement Confirmation: positive ETCO2,  ETT inserted through vocal cords under direct vision and breath sounds checked- equal and bilateral Secured at: 21 cm Tube secured with: Tape Dental Injury: Teeth and Oropharynx as per pre-operative assessment

## 2019-12-01 NOTE — Discharge Summary (Signed)
Physician Discharge Summary  Patient ID: Penny Thomas MRN: [id number] DOB/AGE: 1985/10/05 34 y.o.  Admit date: 12/01/2019 Discharge date: 12/01/2019  Admission Diagnoses: Postoperative state [Z98.890]   Discharge Diagnoses:  Active Problems:   Postoperative state   Discharged Condition: good  Consults:None  Significant Diagnostic Studies: None  Treatments:surgery: XI Robotic Total Laparoscopic Hysterectomy with right salpingectomy, extensive lysis of adhesions  Vitals:   12/01/19 1400 12/01/19 1500  BP: 134/84 132/80  Pulse: 87 88  Resp: 16 14  Temp: 98.3 F (36.8 C) 98.2 F (36.8 C)  SpO2: 100% 100%     Total I/O In: 1100 [I.V.:1000; IV Piggyback:100] Out: 770 [Urine:750; Blood:20]   Hospital Course: Good  Discharge Exam: Normal postop per nursing  Disposition: D/C Home     Allergies as of 12/01/2019      Reactions   [name] [elagolix] Other (See Comments)   Hallucinations      Medication List    STOP taking these medications   norethindrone 0.35 MG tablet Commonly known as: MICRONOR   traMADol 50 MG tablet Commonly known as: ULTRAM     TAKE these medications   albuterol 108 (90 Base) MCG/ACT inhaler Commonly known as: VENTOLIN HFA Inhale 1-2 puffs into the lungs every 6 (six) hours as needed for wheezing or shortness of breath.   celecoxib 100 MG capsule Commonly known as: CeleBREX Take 1 capsule (100 mg total) by mouth 2 (two) times daily. As needed for pain What changed: when to take this   cetirizine 10 MG tablet Commonly known as: ZYRTEC Take 1 tablet (10 mg total) by mouth daily.   cyclobenzaprine 10 MG tablet Commonly known as: FLEXERIL Take 1 tablet (10 mg total) by mouth 3 (three) times daily as needed for muscle spasms.   Lasmiditan Succinate 100 MG Tabs Commonly known as: Reyvow Take 1 tablet by mouth daily as needed (No more than 1 tab in 24 h).   naproxen 500 MG tablet Commonly known as: NAPROSYN Take 1 tablet  (500 mg total) by mouth every 12 (twelve) hours as needed. What changed: reasons to take this   olopatadine 0.1 % ophthalmic solution Commonly known as: Patanol Place 1 drop into both eyes 2 (two) times daily as needed for allergies.   oxyCODONE-acetaminophen 7.5-325 MG tablet Commonly known as: Percocet Take 1 tablet by mouth every 6 (six) hours as needed for up to 5 days for severe pain.   propranolol ER 80 MG 24 hr capsule Commonly known as: INDERAL LA TAKE 1 CAPSULE BY MOUTH ONCE DAILY   sertraline 50 MG tablet Commonly known as: ZOLOFT Take 1 tablet (50 mg total) by mouth daily. Start 25 mg daily x 1 week. What changed:   when to take this  additional instructions   simethicone 125 MG chewable tablet Commonly known as: MYLICON Chew 267 mg by mouth in the morning and at bedtime.         Follow-up Information    [name], MD Follow up in 3 week(s).   Specialty: Obstetrics and Gynecology Contact information: [address] [place] [address] [phone]                Signed: [name] 12/01/2019, 6:18 PM

## 2019-12-01 NOTE — Discharge Instructions (Signed)
Total Laparoscopic Hysterectomy, Care After °This sheet gives you information about how to care for yourself after your procedure. Your health care provider may also give you more specific instructions. If you have problems or questions, contact your health care provider. °What can I expect after the procedure? °After the procedure, it is common to have: °· Pain and bruising around your incisions. °· A sore throat, if a breathing tube was used during surgery. °· Fatigue. °· Poor appetite. °· Less interest in sex. °If your ovaries were also removed, it is also common to have symptoms of menopause such as hot flashes, night sweats, and lack of sleep (insomnia). °Follow these instructions at home: °Bathing °· Do not take baths, swim, or use a hot tub until your health care provider approves. You may need to only take showers for 2-3 weeks. °· Keep your bandage (dressing) dry until your health care provider says it can be removed. °Incision care ° °· Follow instructions from your health care provider about how to take care of your incisions. Make sure you: °? Wash your hands with soap and water before you change your dressing. If soap and water are not available, use hand sanitizer. °? Change your dressing as told by your health care provider. °? Leave stitches (sutures), skin glue, or adhesive strips in place. These skin closures may need to stay in place for 2 weeks or longer. If adhesive strip edges start to loosen and curl up, you may trim the loose edges. Do not remove adhesive strips completely unless your health care provider tells you to do that. °· Check your incision area every day for signs of infection. Check for: °? Redness, swelling, or pain. °? Fluid or blood. °? Warmth. °? Pus or a bad smell. °Activity °· Get plenty of rest and sleep. °· Do not lift anything that is heavier than 10 lbs (4.5 kg) for one month after surgery, or as long as told by your health care provider. °· Do not drive or use heavy  machinery while taking prescription pain medicine. °· Do not drive for 24 hours if you were given a medicine to help you relax (sedative). °· Return to your normal activities as told by your health care provider. Ask your health care provider what activities are safe for you. °Lifestyle ° °· Do not use any products that contain nicotine or tobacco, such as cigarettes and e-cigarettes. These can delay healing. If you need help quitting, ask your health care provider. °· Do not drink alcohol until your health care provider approves. °General instructions °· Do not douche, use tampons, or have sex for at least 6 weeks, or as told by your health care provider. °· Take over-the-counter and prescription medicines only as told by your health care provider. °· To monitor yourself for a fever, take your temperature at least once a day during recovery. °· If you struggle with physical or emotional changes after your procedure, speak with your health care provider or a therapist. °· To prevent or treat constipation while you are taking prescription pain medicine, your health care provider may recommend that you: °? Drink enough fluid to keep your urine clear or pale yellow. °? Take over-the-counter or prescription medicines. °? Eat foods that are high in fiber, such as fresh fruits and vegetables, whole grains, and beans. °? Limit foods that are high in fat and processed sugars, such as fried and sweet foods. °· Keep all follow-up visits as told by your health care provider.   This is important. Contact a health care provider if:  You have chills or a fever.  You have redness, swelling, or pain around an incision.  You have fluid or blood coming from an incision.  Your incision feels warm to the touch.  You have pus or a bad smell coming from an incision.  An incision breaks open.  You feel dizzy or light-headed.  You have pain or bleeding when you urinate.  You have diarrhea, nausea, or vomiting that does not  go away.  You have abnormal vaginal discharge.  You have a rash.  You have pain that does not get better with medicine. Get help right away if:  You have a fever and your symptoms suddenly get worse.  You have severe abdominal pain.  You have chest pain.  You have shortness of breath.  You faint.  You have pain, swelling, or redness on your leg.  You have heavy vaginal bleeding with blood clots. Summary  After the procedure it is common to have abdominal pain. Your provider will give you medication for this.  Do not take baths, swim, or use a hot tub until your health care provider approves.  Do not lift anything that is heavier than 10 lbs (4.5 kg) for one month after surgery, or as long as told by your health care provider.  Notify your provider if you have any signs or symptoms of infection after the procedure. This information is not intended to replace advice given to you by your health care provider. Make sure you discuss any questions you have with your health care provider. Document Revised: 04/25/2017 Document Reviewed: 07/24/2016 Elsevier Patient Education  [place] Instructions  Activity: Get plenty of rest for the remainder of the day. A responsible individual must stay with you for 24 hours following the procedure.  For the next 24 hours, DO NOT: -Drive a car -Paediatric nurse -Drink alcoholic beverages -Take any medication unless instructed by your physician -Make any legal decisions or sign important papers.  Meals: Start with liquid foods such as gelatin or soup. Progress to regular foods as tolerated. Avoid greasy, spicy, heavy foods. If nausea and/or vomiting occur, drink only clear liquids until the nausea and/or vomiting subsides. Call your physician if vomiting continues.  Special Instructions/Symptoms: Your throat may feel dry or sore from the anesthesia or the breathing tube placed in your throat during  surgery. If this causes discomfort, gargle with warm salt water. The discomfort should disappear within 24 hours.  If you had a scopolamine patch placed behind your ear for the management of post- operative nausea and/or vomiting:  1. The medication in the patch is effective for 72 hours, after which it should be removed.  Wrap patch in a tissue and discard in the trash. Wash hands thoroughly with soap and water. 2. You may remove the patch earlier than 72 hours if you experience unpleasant side effects which may include dry mouth, dizziness or visual disturbances. 3. Avoid touching the patch. Wash your hands with soap and water after contact with the patch.     Laparoscopically Assisted Vaginal Hysterectomy, Care After This sheet gives you information about how to care for yourself after your procedure. Your health care provider may also give you more specific instructions. If you have problems or questions, contact your health care provider. What can I expect after the procedure? After the procedure, it is common to have:  Soreness and numbness in your  incision areas.  Abdominal pain. You will be given pain medicine to control it.  Vaginal bleeding and discharge. You will need to use a sanitary napkin after this procedure.  Sore throat from the breathing tube that was inserted during surgery. Follow these instructions at home: Medicines  Take over-the-counter and prescription medicines only as told by your health care provider.  Do not take aspirin or ibuprofen. These medicines can cause bleeding.  Do not drive or use heavy machinery while taking prescription pain medicine.  Do not drive for 24 hours if you were given a medicine to help you relax (sedative) during the procedure. Incision care   Follow instructions from your health care provider about how to take care of your incisions. Make sure you: ? Wash your hands with soap and water before you change your bandage (dressing).  If soap and water are not available, use hand sanitizer. ? Change your dressing as told by your health care provider. ? Leave stitches (sutures), skin glue, or adhesive strips in place. These skin closures may need to stay in place for 2 weeks or longer. If adhesive strip edges start to loosen and curl up, you may trim the loose edges. Do not remove adhesive strips completely unless your health care provider tells you to do that.  Check your incision area every day for signs of infection. Check for: ? Redness, swelling, or pain. ? Fluid or blood. ? Warmth. ? Pus or a bad smell. Activity  Get regular exercise as told by your health care provider. You may be told to take short walks every day and go farther each time.  Return to your normal activities as told by your health care provider. Ask your health care provider what activities are safe for you.  Do not douche, use tampons, or have sexual intercourse for at least 6 weeks, or until your health care provider gives you permission.  Do not lift anything that is heavier than 10 lb (4.5 kg), or the limit that your health care provider tells you, until he or she says that it is safe. General instructions  Do not take baths, swim, or use a hot tub until your health care provider approves. Take showers instead of baths.  Do not drive for 24 hours if you received a sedative.  Do not drive or operate heavy machinery while taking prescription pain medicine.  To prevent or treat constipation while you are taking prescription pain medicine, your health care provider may recommend that you: ? Drink enough fluid to keep your urine clear or pale yellow. ? Take over-the-counter or prescription medicines. ? Eat foods that are high in fiber, such as fresh fruits and vegetables, whole grains, and beans. ? Limit foods that are high in fat and processed sugars, such as fried and sweet foods.  Keep all follow-up visits as told by your health care  provider. This is important. Contact a health care provider if:  You have signs of infection, such as: ? Redness, swelling, or pain around your incision sites. ? Fluid or blood coming from an incision. ? An incision that feels warm to the touch. ? Pus or a bad smell coming from an incision.  Your incision breaks open.  Your pain medicine is not helping.  You feel dizzy or light-headed.  You have pain or bleeding when you urinate.  You have persistent nausea and vomiting.  You have blood, pus, or a bad-smelling discharge from your vagina. Get help right away if:  You have a fever.  You have severe abdominal pain.  You have chest pain.  You have shortness of breath.  You faint.  You have pain, swelling, or redness in your leg.  You have heavy bleeding from your vagina. Summary  After the procedure, it is common to have abdominal pain and vaginal bleeding.  You should not drive or lift heavy objects until your health care provider says that it is safe.  Contact your health care provider if you have any symptoms of infection, excessive vaginal bleeding, nausea, vomiting, or shortness of breath. This information is not intended to replace advice given to you by your health care provider. Make sure you discuss any questions you have with your health care provider. Document Revised: 04/25/2017 Document Reviewed: 07/09/2016 Elsevier Patient Education  2020 [employer].

## 2019-12-01 NOTE — H&P (Signed)
Penny Thomas is an 34 y.o. female. G3P3L4 (1 twin pregnancy)  RP: XI Robotic TLH/Bilateral Salpingectomy.    HPI: No change since last visit on 11/24/2019 when we noted: Started on the Progestin pill 02/2019 which helped for a couple of months, but then the pain/dysmenorrhea worsened again so patient stopped the pill. Insurance didn't cover DepoLupron. S/P Endometrial Ablation. Severe monthly pelvic pains/cramps x about 5 days with persistent lower intensity pain continuously. Deep pain with IC. Pelvic US suggestive of Hematometra10/2020. H/O Endometriosis, stopped [name] because of hallucinations at night.   Pertinent Gynecological History: Blood transfusions: none Sexually transmitted diseases: no past history  Last pap: normal    Menstrual History:  No LMP recorded. Patient has had an ablation.    Past Medical History:  Diagnosis Date  . Allergy   . Anemia    takes iron supplement  . Anxiety   . Asthma   . Asthma    prn inhaler  . Blood transfusion 2007  . Blood transfusion 2010  . Blood transfusion without reported diagnosis   . 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 Hemorrhage in uterus 01-20-09  . Hypertension   . Migraines   . 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 Pre-diabetes   . Seasonal allergies   . Sickle cell anemia (HCC)    trait  . Sickle cell trait [facility])     Past Surgical History:  Procedure Laterality Date  . CESAREAN SECTION  09-28-05  . CESAREAN SECTION  01-20-09  . CESAREAN SECTION    . ENDOMETRIAL ABLATION  10/2011   HEROPTIOIN  . KNEE ARTHROSCOPY Right 02/16/2014   Procedure: RIGHT ARTHROSCOPY KNEE;  Surgeon: Kerin Salen, MD;  Location: [facility];  Service: Orthopedics;  Laterality: Right;  . POLYP REMOVAL  5-08   ?CERVICAL OR UTERINE POLYP  . TUBAL LIGATION  01-20-09   DONE WITH C-SECTION  . WISDOM TOOTH EXTRACTION Right 2014    Family History  Problem Relation Age of Onset  . Fibromyalgia Mother    . Multiple sclerosis Mother   . Asthma Father   . Healthy Maternal Grandmother   . Healthy Maternal Grandfather   . Healthy Paternal Grandmother   . Healthy Paternal Grandfather   . Endometriosis Sister   . Healthy Brother   . Healthy Child     Social History:  reports that she has never smoked. She has never used smokeless tobacco. She reports current alcohol use. She reports that she does not use drugs.  Allergies:  Allergies  Allergen Reactions  . [name] [Elagolix] Other (See Comments)    Hallucinations    Medications Prior to Admission  Medication Sig Dispense Refill Last Dose  . albuterol (PROVENTIL HFA;VENTOLIN HFA) 108 (90 Base) MCG/ACT inhaler Inhale 1-2 puffs into the lungs every 6 (six) hours as needed for wheezing or shortness of breath. 1 Inhaler 1   . celecoxib (CELEBREX) 100 MG capsule Take 1 capsule (100 mg total) by mouth 2 (two) times daily. As needed for pain (Patient taking differently: Take 100 mg by mouth daily. As needed for pain) 60 capsule 2   . cetirizine (ZYRTEC) 10 MG tablet Take 1 tablet (10 mg total) by mouth daily. 30 tablet 5   . cyclobenzaprine (FLEXERIL) 10 MG tablet Take 1 tablet (10 mg total) by mouth 3 (three) times daily as needed for muscle spasms. 90 tablet 1   . naproxen (NAPROSYN) 500 MG tablet Take 1 tablet (500 mg total) by  mouth every 12 (twelve) hours as needed. (Patient taking differently: Take 500 mg by mouth every 12 (twelve) hours as needed (migraine). ) 60 tablet 1   . norethindrone (MICRONOR) 0.35 MG tablet Take 1 tablet (0.35 mg total) by mouth daily. 3 Package 1   . sertraline (ZOLOFT) 50 MG tablet Take 1 tablet (50 mg total) by mouth daily. Start 25 mg daily x 1 week. (Patient taking differently: Take 50 mg by mouth at bedtime. ) 30 tablet 4   . simethicone (MYLICON) 076 MG chewable tablet Chew 125 mg by mouth in the morning and at bedtime.     . traMADol (ULTRAM) 50 MG tablet Take 1 tablet (50 mg total) by mouth 2 (two) times  daily as needed. 12 tablet 0   . Lasmiditan Succinate (REYVOW) 100 MG TABS Take 1 tablet by mouth daily as needed (No more than 1 tab in 24 h). 30 tablet 3 Not Taking at Unknown time  . olopatadine (PATANOL) 0.1 % ophthalmic solution Place 1 drop into both eyes 2 (two) times daily as needed for allergies. 5 mL 5 Not Taking at Unknown time  . oxyCODONE-acetaminophen (PERCOCET) 7.5-325 MG tablet Take 1 tablet by mouth every 6 (six) hours as needed for severe pain. 12 tablet 0   . propranolol ER (INDERAL LA) 80 MG 24 hr capsule TAKE 1 CAPSULE BY MOUTH ONCE DAILY 30 capsule 3     REVIEW OF SYSTEMS: A ROS was performed and pertinent positives and negatives are included in the history.  GENERAL: No fevers or chills. HEENT: No change in vision, no earache, sore throat or sinus congestion. NECK: No pain or stiffness. CARDIOVASCULAR: No chest pain or pressure. No palpitations. PULMONARY: No shortness of breath, cough or wheeze. GASTROINTESTINAL: No abdominal pain, nausea, vomiting or diarrhea, melena or bright red blood per rectum. GENITOURINARY: No urinary frequency, urgency, hesitancy or dysuria. MUSCULOSKELETAL: No joint or muscle pain, no back pain, no recent trauma. DERMATOLOGIC: No rash, no itching, no lesions. ENDOCRINE: No polyuria, polydipsia, no heat or cold intolerance. No recent change in weight. HEMATOLOGICAL: No anemia or easy bruising or bleeding. NEUROLOGIC: No headache, seizures, numbness, tingling or weakness. PSYCHIATRIC: No depression, no loss of interest in normal activity or change in sleep pattern.     There were no vitals taken for this visit.  Physical Exam:  See office notes  Covid Negative Hb 13.5  Pelvic US 02/2019:  T/V images. Anteverted uterus normal in size and shape with no myometrial mass, the uterus measures 7.72 x 4.04 x 3.63 cm. Irregular fluid collection measuring 5.5 cm in left cornual area extending into the proximal aspect of the left tube. Part of the fluid  collection shows debris measuring 2.1 x 1 cm. The endometrium is irregular, but not thickened, probably secondary to endometrial ablation. Both ovaries are normal in size with normal follicular pattern. A dominant follicle is present on the left ovary measuring 2.7 cm. No adnexal mass. No free fluid in the posterior cul-de-sac.   Assessment/Plan:  34 y.o. G3P0004   1. Pelvic pain in female Persistent severe pelvic pain with dysmenorrhea and dyspareunia.  Recurrence of pain even on the progestin pill.  History of endometrial ablation and endometriosis.  Had to stop Isaac Bliss because of hallucinations.  Pelvic ultrasound suggestive of hematometra in 12/2018.  Pelvic ultrasound 02/2019 showing an irregular fluid collection extending to the left tube.  Both ovaries are normal in size.  No adnexal mass.  Decision to proceed with XI  total laparoscopic hysterectomy with bilateral salpingectomy.  Preop preparation, surgery and risks thoroughly reviewed.  Postop precautions and expectations discussed.  Patient voiced understanding and agreement with plan.  2. Dysmenorrhea As above.  3. Hematometra As above.  4. S/P endometrial ablation  5. Endometriosis of pelvis                        Patient was counseled as to the risk of surgery to include the following:  1. Infection (prohylactic antibiotics will be administered)  2. DVT/Pulmonary Embolism (prophylactic pneumo compression stockings will be used)  3.Trauma to internal organs requiring additional surgical procedure to repair any injury to internal organs requiring perhaps additional hospitalization days.  4.Hemmorhage requiring transfusion and blood products which carry risks such as  anaphylactic reaction, hepatitis and AIDS  Patient had received literature information on the procedure scheduled and all her questions were answered and fully accepts all risk.   Marie-Lyne Tereza Gilham 12/01/2019, 6:55 AM

## 2019-12-01 NOTE — Progress Notes (Signed)
Pt with several attempts to void unsuccessful, bladder scan reveals 220cc volume. Will push fluids and discuss with MD.

## 2019-12-01 NOTE — Progress Notes (Signed)
Pt crying and whimpering in pain, states pain med is not helping and pain is a stabbing pain in lower abdomen. Abdomen soft and lap incisions CDI. Unable to reach Dr. Dellis Filbert, Dr. Fransisco Beau made aware and orders received for IV pain meds. Will continue to monitor and manage.

## 2019-12-01 NOTE — Transfer of Care (Signed)
Immediate Anesthesia Transfer of Care Note  Patient: Penny Thomas  Procedure(s) Performed: XI ROBOTIC ASSISTEDTOTAL TOTAL LAPAROSCOPIC HYSTERECTOMY AND RIGHT SALPINGECTOMY , EXTENSIVE LYSIS OF ADHESIONS (Bilateral Abdomen)  Patient Location: PACU  Anesthesia Type:General  Level of Consciousness: drowsy  Airway & Oxygen Therapy: Patient Spontanous Breathing and Patient connected to nasal cannula oxygen  Post-op Assessment: Report given to RN  Post vital signs: Reviewed and stable  Last Vitals:  Vitals Value Taken Time  BP 134/69   Temp    Pulse 82   Resp 15   SpO2 100%     Last Pain:  Vitals:   12/01/19 0635  TempSrc: Oral         Complications: No complications documented.

## 2019-12-02 LAB — SURGICAL PATHOLOGY

## 2019-12-03 ENCOUNTER — Encounter (HOSPITAL_BASED_OUTPATIENT_CLINIC_OR_DEPARTMENT_OTHER): Payer: Self-pay | Admitting: Obstetrics & Gynecology

## 2019-12-05 LAB — TYPE AND SCREEN
ABO/RH(D): O POS
Antibody Screen: POSITIVE
Unit division: 0
Unit division: 0

## 2019-12-05 LAB — BPAM RBC
Blood Product Expiration Date: 202107312359
Blood Product Expiration Date: 202107312359
Unit Type and Rh: 5100
Unit Type and Rh: 5100

## 2019-12-07 NOTE — Telephone Encounter (Signed)
I spoke with patient. No rash. Everything looks fine. Just some itching around the incisions where they are healing.

## 2019-12-15 MED FILL — PROPRANOLOL HCL ER 80 MG CP: 80 | 30 days supply | Qty: 30 | Fill #1

## 2019-12-15 MED FILL — SERTRALINE HCL 50 MG TABS: 50 | 30 days supply | Qty: 30 | Fill #2

## 2019-12-23 ENCOUNTER — Other Ambulatory Visit: Payer: Self-pay

## 2019-12-23 ENCOUNTER — Encounter: Payer: Self-pay | Admitting: Family Medicine

## 2019-12-23 ENCOUNTER — Telehealth: Payer: Self-pay

## 2019-12-23 ENCOUNTER — Encounter: Payer: Self-pay | Admitting: Obstetrics & Gynecology

## 2019-12-23 ENCOUNTER — Ambulatory Visit (INDEPENDENT_AMBULATORY_CARE_PROVIDER_SITE_OTHER): Payer: 59 | Admitting: Obstetrics & Gynecology

## 2019-12-23 VITALS — BP 128/84

## 2019-12-23 DIAGNOSIS — Z09 Encounter for follow-up examination after completed treatment for conditions other than malignant neoplasm: Secondary | ICD-10-CM

## 2019-12-23 DIAGNOSIS — K5900 Constipation, unspecified: Secondary | ICD-10-CM | POA: Diagnosis not present

## 2019-12-23 NOTE — Telephone Encounter (Signed)
Called patient and scheduled her for 12/30/19 @ 3:15

## 2019-12-23 NOTE — Progress Notes (Signed)
° ° °  Penny Thomas [birth year] [id number]        34 y.o.  G3P0004   RP: Postop XI Robotic TLH/Bilateral Salpingectomy 12/01/2019  HPI: Bloated at times with some constipation.  BMs every 1-3 days. Urine normal.  No vaginal bleeding.  Normal vaginal secretions.  Urine normal.  No fever.   OB History  Gravida Para Term Preterm AB Living  3 3     0 4  SAB TAB Ectopic Multiple Live Births      0 1 4    # Outcome Date GA Lbr Len/2nd Weight Sex Delivery Anes PTL Lv  3 Para 01/20/09 [redacted]w[redacted]d    CS-Classical   LIV  2A Para 09/28/05 [redacted]w[redacted]d    Vag-Spont   LIV     Birth Comments: twins  2B Para 09/28/05 [redacted]w[redacted]d    CS-Classical   LIV  1 Para 07/03/04 [redacted]w[redacted]d    Vag-Spont   LIV    Past medical history,surgical history, problem list, medications, allergies, family history and social history were all reviewed and documented in the EPIC chart.   Directed ROS with pertinent positives and negatives documented in the history of present illness/assessment and plan.  Exam:  Vitals:   12/23/19 1412  BP: 128/84   General appearance:  Normal  Abdomen: Soft, no distention.  Incisions healing well.  Closed.  No erythema or induration.  Gynecologic exam: Vulva normal.  Speculum:  Vaginal vault closed.  No bleeding.  Normal secretions.   Assessment/Plan:  34 y.o. G3P0004   1. Status post gynecological surgery, follow-up exam Good postop evolution, healing well.  Tendency to constipation with some intermittent intestinal bloating.  Management of constipation thoroughly reviewed including nutrition, hydration and ambulation, as well as stool softeners.  Precautions reviewed.  No intercourse until follow-up.  Patient will return to work at 6 weeks.  Follow-up in 4 weeks.  2. Constipation, unspecified constipation type Tendency to constipation with some intermittent intestinal bloating.  Management of constipation thoroughly reviewed including nutrition, hydration and ambulation, as well as stool  softeners.  [name] MD, 2:45 PM 12/23/2019

## 2019-12-23 NOTE — Telephone Encounter (Signed)
Patient called stating that at visit yesterday Dr. Marguerita Merles recommended she take two more weeks out of work to be out 6 weeks instead of the 4 originally planned. Revised FMLA form was emailed to her employer as requested.

## 2019-12-30 ENCOUNTER — Ambulatory Visit: Payer: Self-pay | Admitting: Family Medicine

## 2019-12-30 MED FILL — CYCLOBENZAPRINE HCL 10 MG T: 10 | 30 days supply | Qty: 90 | Fill #1

## 2020-01-03 ENCOUNTER — Ambulatory Visit: Payer: 59 | Admitting: Family Medicine

## 2020-01-03 ENCOUNTER — Institutional Professional Consult (permissible substitution): Payer: 59 | Admitting: Family Medicine

## 2020-01-03 ENCOUNTER — Other Ambulatory Visit: Payer: Self-pay

## 2020-01-03 ENCOUNTER — Other Ambulatory Visit: Payer: Self-pay | Admitting: Family Medicine

## 2020-01-03 VITALS — BP 130/80 | HR 91 | Wt 186.4 lb

## 2020-01-03 DIAGNOSIS — F410 Panic disorder [episodic paroxysmal anxiety] without agoraphobia: Secondary | ICD-10-CM

## 2020-01-03 DIAGNOSIS — K219 Gastro-esophageal reflux disease without esophagitis: Secondary | ICD-10-CM | POA: Diagnosis not present

## 2020-01-03 DIAGNOSIS — G4700 Insomnia, unspecified: Secondary | ICD-10-CM

## 2020-01-03 DIAGNOSIS — K5900 Constipation, unspecified: Secondary | ICD-10-CM | POA: Diagnosis not present

## 2020-01-03 DIAGNOSIS — F418 Other specified anxiety disorders: Secondary | ICD-10-CM | POA: Diagnosis not present

## 2020-01-03 DIAGNOSIS — R69 Illness, unspecified: Secondary | ICD-10-CM | POA: Diagnosis not present

## 2020-01-03 MED ORDER — SERTRALINE HCL 50 MG PO TABS: 50.0000 mg | ORAL_TABLET | Freq: Every day | ORAL | 5 refills | Status: DC

## 2020-01-03 MED ORDER — ESOMEPRAZOLE MAGNESIUM 40 MG PO CPDR: 40.0000 mg | DELAYED_RELEASE_CAPSULE | Freq: Every day | ORAL | 1 refills | Status: DC

## 2020-01-03 MED FILL — ESOMEPRAZOLE MAG DR 40 MG C: 40 | 30 days supply | Qty: 30 | Fill #0

## 2020-01-03 NOTE — Patient Instructions (Signed)
Switch your Zoloft to morning and see how you do.   Start high dose Nexium and avoid food triggers.  This is short term and you should go see GI if you are not improving.  Avoid NSAIDs   Increase Miralax to twice daily and increase your activity level.      Gastroesophageal Reflux Disease, Adult Gastroesophageal reflux (GER) happens when acid from the stomach flows up into the tube that connects the mouth and the stomach (esophagus). Normally, food travels down the esophagus and stays in the stomach to be digested. With GER, food and stomach acid sometimes move back up into the esophagus. You may have a disease called gastroesophageal reflux disease (GERD) if the reflux:  Happens often.  Causes frequent or very bad symptoms.  Causes problems such as damage to the esophagus. When this happens, the esophagus becomes sore and swollen (inflamed). Over time, GERD can make small holes (ulcers) in the lining of the esophagus. What are the causes? This condition is caused by a problem with the muscle between the esophagus and the stomach. When this muscle is weak or not normal, it does not close properly to keep food and acid from coming back up from the stomach. The muscle can be weak because of:  Tobacco use.  Pregnancy.  Having a certain type of hernia (hiatal hernia).  Alcohol use.  Certain foods and drinks, such as coffee, chocolate, onions, and peppermint. What increases the risk? You are more likely to develop this condition if you:  Are overweight.  Have a disease that affects your connective tissue.  Use NSAID medicines. What are the signs or symptoms? Symptoms of this condition include:  Heartburn.  Difficult or painful swallowing.  The feeling of having a lump in the throat.  A bitter taste in the mouth.  Bad breath.  Having a lot of saliva.  Having an upset or bloated stomach.  Belching.  Chest pain. Different conditions can cause chest pain. Make sure  you see your doctor if you have chest pain.  Shortness of breath or noisy breathing (wheezing).  Ongoing (chronic) cough or a cough at night.  Wearing away of the surface of teeth (tooth enamel).  Weight loss. How is this treated? Treatment will depend on how bad your symptoms are. Your doctor may suggest:  Changes to your diet.  Medicine.  Surgery. Follow these instructions at home: Eating and drinking   Follow a diet as told by your doctor. You may need to avoid foods and drinks such as: ? Coffee and tea (with or without caffeine). ? Drinks that contain alcohol. ? Energy drinks and sports drinks. ? Bubbly (carbonated) drinks or sodas. ? Chocolate and cocoa. ? Peppermint and mint flavorings. ? Garlic and onions. ? Horseradish. ? Spicy and acidic foods. These include peppers, chili powder, curry powder, vinegar, hot sauces, and BBQ sauce. ? Citrus fruit juices and citrus fruits, such as oranges, lemons, and limes. ? Tomato-based foods. These include red sauce, chili, salsa, and pizza with red sauce. ? Fried and fatty foods. These include donuts, french fries, potato chips, and high-fat dressings. ? High-fat meats. These include hot dogs, rib eye steak, sausage, ham, and bacon. ? High-fat dairy items, such as whole milk, butter, and cream cheese.  Eat small meals often. Avoid eating large meals.  Avoid drinking large amounts of liquid with your meals.  Avoid eating meals during the 2-3 hours before bedtime.  Avoid lying down right after you eat.  Do not  exercise right after you eat. Lifestyle   Do not use any products that contain nicotine or tobacco. These include cigarettes, e-cigarettes, and chewing tobacco. If you need help quitting, ask your doctor.  Try to lower your stress. If you need help doing this, ask your doctor.  If you are overweight, lose an amount of weight that is healthy for you. Ask your doctor about a safe weight loss goal. General  instructions  Pay attention to any changes in your symptoms.  Take over-the-counter and prescription medicines only as told by your doctor. Do not take aspirin, ibuprofen, or other NSAIDs unless your doctor says it is okay.  Wear loose clothes. Do not wear anything tight around your waist.  Raise (elevate) the head of your bed about 6 inches (15 cm).  Avoid bending over if this makes your symptoms worse.  Keep all follow-up visits as told by your doctor. This is important. Contact a doctor if:  You have new symptoms.  You lose weight and you do not know why.  You have trouble swallowing or it hurts to swallow.  You have wheezing or a cough that keeps happening.  Your symptoms do not get better with treatment.  You have a hoarse voice. Get help right away if:  You have pain in your arms, neck, jaw, teeth, or back.  You feel sweaty, dizzy, or light-headed.  You have chest pain or shortness of breath.  You throw up (vomit) and your throw-up looks like blood or coffee grounds.  You pass out (faint).  Your poop (stool) is bloody or black.  You cannot swallow, drink, or eat. Summary  If a person has gastroesophageal reflux disease (GERD), food and stomach acid move back up into the esophagus and cause symptoms or problems such as damage to the esophagus.  Treatment will depend on how bad your symptoms are.  Follow a diet as told by your doctor.  Take all medicines only as told by your doctor. This information is not intended to replace advice given to you by your health care provider. Make sure you discuss any questions you have with your health care provider. Document Revised: 11/19/2017 Document Reviewed: 11/19/2017 Elsevier Patient Education  [employer].

## 2020-01-03 NOTE — Progress Notes (Signed)
Subjective:    Patient ID: Penny Thomas, female    DOB: 05-May-1986, 34 y.o.   MRN: [id number]  HPI Chief Complaint  Patient presents with  . multiple issues    needs refill on zoloft, sleep issues- not sleeping at night., acid reflux- smelling anything and throat gets hot and and burping   She is here to discuss multiple issues including anxiety, insomnia, constipation and GERD.  She has been taking Zoloft daily for situational anxiety and panic attacks. 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 States she can tell the medication is working. She no longer has panic attacks when she is on bridges. Would like to continue the medication.  Currently taking Zoloft at night and wants to start taking it in the morning.   States she has trouble falling asleep. This has been going on for the past 4-5 weeks since having a hysterectomy. States she has been home and not working so she has been sleeping until 9 or 10 am. Normally she has to get up at 6:30 am with her job.  States she goes to bed at 9 pm and cannot fall asleep until 4 am. Denies watching TV. No caffeine or eating before bedtime.  States she tried melatonin and it did not help.  States she has never tried any other sleep medication.   GERD- has been having acid reflux flare ups over the past 2-3 weeks.  States she was taking ibuprofen after surgery for pain.  Prilosec in the past.  Now using Tums and not helping.   States she had an endoscopy 2 years ago and normal. Has not seen GI since.   Complains of constipation for the past couple of weeks. Taking Senna, Miralax and Colace. States she was taking oxycodone after hysterectomy last month. Is no longer taking it. Has been less active than usual. Drinks plenty of water.   Denies fever, chills, dizziness, chest pain, palpitations, shortness of breath, abdominal pain, N/V/D, urinary symptoms, LE edema.   Reviewed allergies, medications, past medical, surgical,  family, and social history.   Review of Systems Pertinent positives and negatives in the history of present illness.     Objective:   Physical Exam BP 130/80   Pulse 91   Wt 186 lb 6.4 oz (84.6 kg)   LMP 11/29/2015   BMI 29.19 kg/m   Alert and oriented and in no acute distress. Respirations unlabored. Extremities without edema. Normal gait. Normal speech, mood and thought process.       Assessment & Plan:  Situational anxiety - Plan: sertraline (ZOLOFT) 50 MG tablet, TSH, T4, free, T3 -seems to be doing well on sertraline. Continue medication but she will try switching from bedtime to morning dosing due to her sleep cycle and see how she does.   Insomnia, unspecified type - Plan: CBC with Differential/Platelet, Comprehensive metabolic panel, TSH, T4, free, T3 -counseling on good sleep hygiene. Recommend switching sertraline to morning time to see if this helps her sleep schedule. I also recommend that she go ahead and get back on her normal sleep cycle for work since she starts back to work next week. She gets up typically at 6:30 am. Recommend that if she has not fallen asleep after 30 minutes, that she get up and do something until she is ready to fall asleep. Discussed re-training herself to go to sleep when she goes to bed. Check labs and follow up  Constipation, unspecified constipation type - Plan: CBC with Differential/Platelet,  Comprehensive metabolic panel, TSH, T4, free, T3 -suspect this is related to recent Opioid use post hysterectomy and inactivity. She will increase her Miralax to twice daily for now and continue Colace. Stay well hydrated and increase physical activity as tolerated. Follow up if not improving.   Gastroesophageal reflux disease, unspecified whether esophagitis present - Plan: esomeprazole (NEXIUM) 40 MG capsule -she was taking ibuprofen after surgery. Hold NSAIDs now and watch for food triggers. Avoid eating large meals and eating and laying down. Try  high does Nexium for the next 4 weeks and cut back if under better control. If not, I recommend follow up with her GI. She agrees.   Panic attacks - Plan: sertraline (ZOLOFT) 50 MG tablet Controlled. Continue sertraline.

## 2020-01-04 ENCOUNTER — Encounter: Payer: Self-pay | Admitting: Family Medicine

## 2020-01-04 LAB — COMPREHENSIVE METABOLIC PANEL
ALT: 18 IU/L (ref 0–32)
AST: 18 IU/L (ref 0–40)
Albumin/Globulin Ratio: 1.6 (ref 1.2–2.2)
Albumin: 4.5 g/dL (ref 3.8–4.8)
Alkaline Phosphatase: 77 IU/L (ref 48–121)
BUN/Creatinine Ratio: 16 (ref 9–23)
BUN: 13 mg/dL (ref 6–20)
Bilirubin Total: 0.6 mg/dL (ref 0.0–1.2)
CO2: 22 mmol/L (ref 20–29)
Calcium: 9.6 mg/dL (ref 8.7–10.2)
Chloride: 100 mmol/L (ref 96–106)
Creatinine, Ser: 0.79 mg/dL (ref 0.57–1.00)
GFR calc Af Amer: 113 mL/min/{1.73_m2} (ref >59)
GFR calc non Af Amer: 98 mL/min/{1.73_m2} (ref >59)
Globulin, Total: 2.8 g/dL (ref 1.5–4.5)
Glucose: 109 mg/dL — ABNORMAL HIGH (ref 65–99)
Potassium: 4.4 mmol/L (ref 3.5–5.2)
Sodium: 136 mmol/L (ref 134–144)
Total Protein: 7.3 g/dL (ref 6.0–8.5)

## 2020-01-04 LAB — T3: T3, Total: 91 ng/dL (ref 71–180)

## 2020-01-04 LAB — CBC WITH DIFFERENTIAL/PLATELET
Basophils Absolute: 0 10*3/uL (ref 0.0–0.2)
Basos: 0 %
EOS (ABSOLUTE): 0.1 10*3/uL (ref 0.0–0.4)
Eos: 1 %
Hematocrit: 39.8 % (ref 34.0–46.6)
Hemoglobin: 13.3 g/dL (ref 11.1–15.9)
Immature Grans (Abs): 0 10*3/uL (ref 0.0–0.1)
Immature Granulocytes: 0 %
Lymphocytes Absolute: 1.8 10*3/uL (ref 0.7–3.1)
Lymphs: 27 %
MCH: 28.4 pg (ref 26.6–33.0)
MCHC: 33.4 g/dL (ref 31.5–35.7)
MCV: 85 fL (ref 79–97)
Monocytes Absolute: 0.5 10*3/uL (ref 0.1–0.9)
Monocytes: 8 %
Neutrophils Absolute: 4.1 10*3/uL (ref 1.4–7.0)
Neutrophils: 64 %
Platelets: 281 10*3/uL (ref 150–450)
RBC: 4.69 x10E6/uL (ref 3.77–5.28)
RDW: 13.5 % (ref 11.7–15.4)
WBC: 6.5 10*3/uL (ref 3.4–10.8)

## 2020-01-04 LAB — T4, FREE: Free T4: 1 ng/dL (ref 0.82–1.77)

## 2020-01-04 LAB — TSH: TSH: 1.47 u[IU]/mL (ref 0.450–4.500)

## 2020-01-10 MED FILL — SERTRALINE HCL 50 MG TABLET: 50 | 30 days supply | Qty: 30 | Fill #0

## 2020-01-12 MED FILL — CELECOXIB 100 MG CAPS: 100 | 30 days supply | Qty: 60 | Fill #1

## 2020-01-12 MED FILL — PROPRANOLOL HCL ER 80 MG CP: 80 | 30 days supply | Qty: 30 | Fill #2

## 2020-01-24 ENCOUNTER — Other Ambulatory Visit: Payer: Self-pay

## 2020-01-24 ENCOUNTER — Ambulatory Visit (INDEPENDENT_AMBULATORY_CARE_PROVIDER_SITE_OTHER): Payer: 59 | Admitting: Obstetrics & Gynecology

## 2020-01-24 ENCOUNTER — Encounter: Payer: Self-pay | Admitting: Obstetrics & Gynecology

## 2020-01-24 VITALS — BP 118/76

## 2020-01-24 DIAGNOSIS — Z09 Encounter for follow-up examination after completed treatment for conditions other than malignant neoplasm: Secondary | ICD-10-CM

## 2020-01-24 DIAGNOSIS — K5900 Constipation, unspecified: Secondary | ICD-10-CM

## 2020-01-24 NOTE — Progress Notes (Signed)
    Penny Thomas 1985/12/15 [id number]        34 y.o.  G3P0004   RP: Postop XI Robotic TLH/Rt Salpingectomy and extensive lysis of adhesions 12/01/2019  HPI: Good postop evolution except for developing constipation since surgery.  Took narcotics for pain x1 week after surgery.  Currently on multiple stool softeners including MiraLAX and Colace.  No vaginal bleeding.  No abnormal vaginal discharge.  Had sexual activity yesterday without problem.  Urine normal.  No fever.   OB History  Gravida Para Term Preterm AB Living  3 3     0 4  SAB TAB Ectopic Multiple Live Births      0 1 4    # Outcome Date GA Lbr Len/2nd Weight Sex Delivery Anes PTL Lv  3 Para 01/20/09 [redacted]w[redacted]d    CS-Classical   LIV  2A Para 09/28/05 [redacted]w[redacted]d    Vag-Spont   LIV     Birth Comments: twins  2B Para 09/28/05 [redacted]w[redacted]d    CS-Classical   LIV  1 Para 07/03/04 [redacted]w[redacted]d    Vag-Spont   LIV    Past medical history,surgical history, problem list, medications, allergies, family history and social history were all reviewed and documented in the EPIC chart.   Directed ROS with pertinent positives and negatives documented in the history of present illness/assessment and plan.  Exam:  Vitals:   01/24/20 1546  BP: 118/76   General appearance:  Normal  Abdomen: Soft, not distended, nontender.  Incisions well-healed.  Gynecologic exam: Vulva normal.  Bimanual exam: Vaginal cuff well closed with no induration and none tender.  No pelvic mass felt.   Assessment/Plan:  34 y.o. G3P0004   1. Status post gynecological surgery, follow-up exam Good postop evolution without complication.  Vaginal vault well-healed.  Resumed sexual activity without problems.  Back to work.  Can resume all physical activities.  2. Constipation, unspecified constipation type Nutrition to favor good soft bowel movements reviewed.  Recommend to push hydration.  We will follow-up with family physician if no improvement.  Penny Bruins MD, 4:11  PM 01/24/2020

## 2020-01-25 ENCOUNTER — Encounter: Payer: Self-pay | Admitting: Obstetrics & Gynecology

## 2020-02-01 MED FILL — ESOMEPRAZOLE MAG DR 40 MG C: 40 | 30 days supply | Qty: 30 | Fill #1

## 2020-02-02 ENCOUNTER — Other Ambulatory Visit: Payer: Self-pay | Admitting: Obstetrics & Gynecology

## 2020-02-02 MED ORDER — FLUCONAZOLE 150 MG PO TABS
ORAL_TABLET | ORAL | 2 refills | Status: DC
Start: 2020-02-02 — End: 2020-05-24

## 2020-02-02 MED FILL — FLUCONAZOLE 150 MG TABS: 150 | 3 days supply | Qty: 3 | Fill #0

## 2020-02-17 MED FILL — PROPRANOLOL HCL ER 80 MG CP: 80 | 30 days supply | Qty: 30 | Fill #3

## 2020-02-17 MED FILL — SERTRALINE HCL 50 MG TABLET: 50 | 30 days supply | Qty: 30 | Fill #1

## 2020-02-17 MED FILL — CELECOXIB 100 MG CAPS: 100 | 30 days supply | Qty: 60 | Fill #2

## 2020-02-18 ENCOUNTER — Other Ambulatory Visit: Payer: Self-pay | Admitting: Family Medicine

## 2020-02-18 DIAGNOSIS — K219 Gastro-esophageal reflux disease without esophagitis: Secondary | ICD-10-CM

## 2020-02-20 DIAGNOSIS — S70312A Abrasion, left thigh, initial encounter: Secondary | ICD-10-CM | POA: Diagnosis not present

## 2020-02-23 ENCOUNTER — Encounter: Payer: Self-pay | Admitting: Family Medicine

## 2020-03-16 ENCOUNTER — Other Ambulatory Visit: Payer: Self-pay

## 2020-03-16 DIAGNOSIS — R232 Flushing: Secondary | ICD-10-CM

## 2020-03-16 DIAGNOSIS — R454 Irritability and anger: Secondary | ICD-10-CM

## 2020-03-16 MED FILL — SERTRALINE HCL 50 MG TABLET: 50 | 30 days supply | Qty: 30 | Fill #3

## 2020-03-20 ENCOUNTER — Encounter: Payer: Self-pay | Admitting: Family Medicine

## 2020-03-20 ENCOUNTER — Other Ambulatory Visit: Payer: Self-pay

## 2020-03-20 ENCOUNTER — Other Ambulatory Visit: Payer: 59

## 2020-03-20 DIAGNOSIS — K219 Gastro-esophageal reflux disease without esophagitis: Secondary | ICD-10-CM

## 2020-03-20 DIAGNOSIS — R454 Irritability and anger: Secondary | ICD-10-CM

## 2020-03-20 DIAGNOSIS — N951 Menopausal and female climacteric states: Secondary | ICD-10-CM

## 2020-03-20 DIAGNOSIS — R69 Illness, unspecified: Secondary | ICD-10-CM | POA: Diagnosis not present

## 2020-03-20 NOTE — Telephone Encounter (Signed)
Ok to refill. If she is needing this on a regular basis then It may be time to see GI

## 2020-03-21 ENCOUNTER — Other Ambulatory Visit: Payer: Self-pay

## 2020-03-21 LAB — THYROID PANEL WITH TSH
Free Thyroxine Index: 2 (ref 1.4–3.8)
T3 Uptake: 33 % (ref 22–35)
T4, Total: 6.2 ug/dL (ref 5.1–11.9)
TSH: 1.13 mIU/L

## 2020-03-21 LAB — FOLLICLE STIMULATING HORMONE: FSH: 3.8 m[IU]/mL

## 2020-03-21 MED ORDER — PROPRANOLOL HCL ER 80 MG PO CP24
80.0000 mg | ORAL_CAPSULE | Freq: Every day | ORAL | 3 refills | Status: DC
Start: 2020-03-21 — End: 2020-05-22

## 2020-03-21 MED ORDER — NAPROXEN 500 MG PO TABS
500.0000 mg | ORAL_TABLET | Freq: Two times a day (BID) | ORAL | 1 refills | Status: DC | PRN
Start: 2020-03-21 — End: 2020-05-22

## 2020-03-21 MED ORDER — ESOMEPRAZOLE MAGNESIUM 40 MG PO CPDR: DELAYED_RELEASE_CAPSULE | ORAL | 1 refills | Status: DC

## 2020-03-21 MED FILL — NAPROXEN 500 MG TABS: 500 | 30 days supply | Qty: 60 | Fill #0

## 2020-03-21 MED FILL — ESOMEPRAZOLE MAG DR 40 MG C: 40 | 30 days supply | Qty: 30 | Fill #0

## 2020-03-21 MED FILL — PROPRANOLOL HCL ER 80 MG CP: 80 | 30 days supply | Qty: 30 | Fill #0

## 2020-04-10 ENCOUNTER — Encounter: Payer: Self-pay | Admitting: Family Medicine

## 2020-04-17 MED FILL — ESOMEPRAZOLE MAG DR 40 MG C: 40 | 30 days supply | Qty: 30 | Fill #1

## 2020-04-17 MED FILL — FLUCONAZOLE 150 MG TABS: 150 | 3 days supply | Qty: 3 | Fill #1

## 2020-04-17 MED FILL — SERTRALINE HCL 50 MG TABLET: 50 | 30 days supply | Qty: 30 | Fill #4

## 2020-04-17 MED FILL — PROPRANOLOL HCL ER 80 MG CP: 80 | 30 days supply | Qty: 30 | Fill #1

## 2020-04-18 ENCOUNTER — Encounter: Payer: Self-pay | Admitting: Family Medicine

## 2020-05-18 MED FILL — SERTRALINE HCL 50 MG TABLET: 50 | 30 days supply | Qty: 30 | Fill #2

## 2020-05-18 MED FILL — PROPRANOLOL HCL ER 80 MG CP: 80 | 30 days supply | Qty: 30 | Fill #2

## 2020-05-18 MED FILL — NAPROXEN 500 MG TABS: 500 | 30 days supply | Qty: 60 | Fill #1

## 2020-05-21 NOTE — Progress Notes (Signed)
NEUROLOGY FOLLOW UP OFFICE NOTE  KESHANA MELVIN NH:[id number]   Subjective:  Penny Thomas is a 34year old right-handed woman who follows up for migraines.  UPDATE: Colder weather makes it worse.  She reports 4 migraines in December.  Two migraines were aborted quickly with 2 of them but one of them lasted 3 days.    Rescue protocol: First line, naproxen 500mg  (at work), will take sumatriptan Eugenio Saenz or Reyvow at home. Current NSAID: Naproxen 500mg  Current analgesics:tramadol 50mg  Current triptans:Sumatriptan Raoul(have not used them) Current ergotamine:none Other abortive therapy: Reyvow Current anti-emetic:none Current muscle relaxants:none Current anti-anxiolytic:none Current sleep aide:none Current Antihypertensive medications:Propranolol ER80mg  Current Antidepressant medications:sertraline 50mg  Current Anticonvulsant medications:none Current anti-CGRP:none Current Vitamins/Herbal/Supplements:none Current Antihistamines/Decongestants:none Other therapy:Reyvow Hormone/birth control: [name] Other medication:none  Caffeine:no Alcohol:seldom Smoker:no Diet:hydrates Exercise:no Depression:no; Anxiety:no Other pain:no Sleep hygiene:okay  HISTORY: Onset: Age 26. Location:Varies (bi-frontal, back of head) Quality:Squeezing/"feels like I am hit in the head with a hammer" Initial Intensity:9-10/10 Aura:blurred vision Prodrome:no Associated symptoms:Nausea,dizziness, photophobia, osmophobia Initial Duration:1 to 3 days initiail Frequency:3 to 4 days per week (16 headache days per week) Triggers: Menstrual cycle, caffeine, scents (perfume, deoderant, hand sanitizer), change in weather Relieving factors: Laying down Activity:Lays down if severe  Past NSAIDS:Mobic, ibuprofen 800mg , Cambia Past analgesics:Hydrocodone-acetaminophen, tramadol 50mg , Roxicet, Excedrin Migraine Past  abortive triptans:sumatriptan 100mg , Maxalt 10mg , Relpax 20mg , sumatriptan Penton Past ergot: none Past muscle relaxants:Flexeril, baclofen, tizanidine 2mg , Robaxin Past anti-nausea:Zofran 4mg , promethazine 25mg  Past antihypertensive medications:no Past antidepressant medications:amitriptyline 50mg  (weight gain) Past anticonvulsant medications:topiramate 150mg  (effective but caused hair loss) Past vitamins/Herbal/Supplements:no Past antihistamines/decongestants:no Other past medications:no  Family history of headache:Mom, sister, grandmother  PAST MEDICAL HISTORY: Past Medical History:  Diagnosis Date  . Allergy   . Anemia    takes iron supplement  . Anxiety   . Asthma   . Asthma    prn inhaler  . Blood transfusion 2007  . Blood transfusion 2010  . Blood transfusion without reported diagnosis   . 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 Hemorrhage in uterus 01-20-09  . Hypertension   . Migraines   . 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 Pre-diabetes   . Seasonal allergies   . Sickle cell anemia (HCC)    trait  . Sickle cell trait ([name])     MEDICATIONS: Current Outpatient Medications on File Prior to Visit  Medication Sig Dispense Refill  . albuterol (PROVENTIL HFA;VENTOLIN HFA) 108 (90 Base) MCG/ACT inhaler Inhale 1-2 puffs into the lungs every 6 (six) hours as needed for wheezing or shortness of breath. 1 Inhaler 1  . celecoxib (CELEBREX) 100 MG capsule Take 1 capsule (100 mg total) by mouth 2 (two) times daily. As needed for pain (Patient taking differently: Take 100 mg by mouth daily. As needed for pain) 60 capsule 2  . cetirizine (ZYRTEC) 10 MG tablet Take 1 tablet (10 mg total) by mouth daily. 30 tablet 5  . cyclobenzaprine (FLEXERIL) 10 MG tablet Take 1 tablet (10 mg total) by mouth 3 (three) times daily as needed for muscle spasms. 90 tablet 1  . esomeprazole (NEXIUM) 40 MG capsule TAKE 1 CAPSULE BY MOUTH ONCE A DAY AT 12 NOON 30 capsule 1  .  fluconazole (DIFLUCAN) 150 MG tablet Take one tab po daily x 3 days. 3 tablet 2  . Lasmiditan Succinate (REYVOW) 100 MG TABS Take 1 tablet by mouth daily as needed (No more than 1 tab in 24 h). 30 tablet 3  . naproxen (NAPROSYN) 500 MG  tablet Take 1 tablet (500 mg total) by mouth every 12 (twelve) hours as needed. 60 tablet 1  . olopatadine (PATANOL) 0.1 % ophthalmic solution Place 1 drop into both eyes 2 (two) times daily as needed for allergies. 5 mL 5  . propranolol ER (INDERAL LA) 80 MG 24 hr capsule Take 1 capsule (80 mg total) by mouth daily. 30 capsule 3  . sertraline (ZOLOFT) 50 MG tablet Take 1 tablet (50 mg total) by mouth at bedtime. 30 tablet 5  . simethicone (MYLICON) 0000000 MG chewable tablet Chew 125 mg by mouth in the morning and at bedtime.     No current facility-administered medications on file prior to visit.    ALLERGIES: Allergies  Allergen Reactions  . [name] [Elagolix] Other (See Comments)    Hallucinations    FAMILY HISTORY: Family History  Problem Relation Age of Onset  . Fibromyalgia Mother   . Multiple sclerosis Mother   . Asthma Father   . Healthy Maternal Grandmother   . Healthy Maternal Grandfather   . Healthy Paternal Grandmother   . Healthy Paternal Grandfather   . Endometriosis Sister   . Healthy Brother   . Healthy Child     SOCIAL HISTORY: Social History   Socioeconomic History  . Marital status: Married    Spouse name: Not on file  . Number of children: 4  . Years of education: 59  . Highest education level: Associate degree: academic program  Occupational History  . Occupation: NT/Transporter  Tobacco Use  . Smoking status: Never Smoker  . Smokeless tobacco: Never Used  Vaping Use  . Vaping Use: Never used  Substance and Sexual Activity  . Alcohol use: Yes    Alcohol/week: 0.0 standard drinks    Comment: seldom  . Drug use: No  . Sexual activity: Yes    Birth control/protection: Surgical, None    Comment: TUBAL LIGATION,  INTERCOURSE AGE 72, SEXUAL PARTNERS 5  Other Topics Concern  . Not on file  Social History Narrative   Fun: Baking, playing with the children.   Denies abuse and feels safe at home.       Pt does not drink coffee, tea or soda   Pt lives with spouse and 4 children   Pt is right handed   She has a associates degree   One story home       Social Determinants of Radio broadcast assistant Strain: Not on file  Food Insecurity: Not on file  Transportation Needs: Not on file  Physical Activity: Not on file  Stress: Not on file  Social Connections: Not on file  Intimate Partner Violence: Not on file     Objective:  Blood pressure 108/72, pulse 86, height 5\' 7"  (1.702 m), weight 182 lb 3.2 oz (82.6 kg), last menstrual period 11/29/2015, SpO2 97 %. General: No acute distress.  Patient appears well-groomed.   Head:  Normocephalic/atraumatic Eyes:  Fundi examined but not visualized Neurological Exam: alert and oriented to person, place, and time. Attention span and concentration intact, recent and remote memory intact, fund of knowledge intact.  Speech fluent and not dysarthric, language intact.  CN II-XII intact. Bulk and tone normal, muscle strength 5/5 throughout.  Sensation to light touch, temperature and vibration intact.  Deep tendon reflexes 2+ throughout, toes downgoing.  Finger to nose and heel to shin testing intact.  Gait normal, Romberg negative.   Assessment/Plan:   1.  Migraine with and without aura, without status migrainosus, not  intractable.  One intractable migraine over past month, the only one in over a year.  1.  Migraine prevention:  Propranolol ER 80mg  daily 2.  Migraine rescue:  First line - naproxen; second line - sumatriptan NS or Reyvow 3.  Limit use of pain relievers to no more than 2 days out of week to prevent risk of rebound or medication-overuse headache. 4.  Keep headache diary 5.  Follow up one year  Penny Clines, DO  CC: Harland Dingwall,  NP-C

## 2020-05-22 ENCOUNTER — Other Ambulatory Visit: Payer: Self-pay | Admitting: Neurology

## 2020-05-22 ENCOUNTER — Encounter: Payer: Self-pay | Admitting: Physical Medicine and Rehabilitation

## 2020-05-22 ENCOUNTER — Encounter: Payer: Self-pay | Admitting: Neurology

## 2020-05-22 ENCOUNTER — Other Ambulatory Visit: Payer: Self-pay

## 2020-05-22 ENCOUNTER — Ambulatory Visit (INDEPENDENT_AMBULATORY_CARE_PROVIDER_SITE_OTHER): Payer: 59 | Admitting: Neurology

## 2020-05-22 VITALS — BP 108/72 | HR 86 | Ht 67.0 in | Wt 182.2 lb

## 2020-05-22 DIAGNOSIS — G43009 Migraine without aura, not intractable, without status migrainosus: Secondary | ICD-10-CM

## 2020-05-22 DIAGNOSIS — G43109 Migraine with aura, not intractable, without status migrainosus: Secondary | ICD-10-CM | POA: Diagnosis not present

## 2020-05-22 MED ORDER — SUMATRIPTAN SUCCINATE 6 MG/0.5ML ~~LOC~~ SOAJ
6.0000 mg | SUBCUTANEOUS | 5 refills | Status: DC | PRN
Start: 2020-05-22 — End: 2020-05-22

## 2020-05-22 MED ORDER — PROPRANOLOL HCL ER 80 MG PO CP24
80.0000 mg | ORAL_CAPSULE | Freq: Every day | ORAL | 5 refills | Status: DC
Start: 2020-05-22 — End: 2020-05-22

## 2020-05-22 MED ORDER — NAPROXEN 500 MG PO TABS
500.0000 mg | ORAL_TABLET | Freq: Two times a day (BID) | ORAL | 5 refills | Status: DC | PRN
Start: 2020-05-22 — End: 2021-04-26

## 2020-05-22 MED FILL — SUMATRIPTAN 6 MG/0.5 ML INJ: 6 | 3 days supply | Qty: 3 | Fill #0

## 2020-05-22 NOTE — Patient Instructions (Signed)
1.  Refilled propranolol, naproxen and sumatriptan shot.

## 2020-05-24 ENCOUNTER — Ambulatory Visit: Payer: 59 | Admitting: Physician Assistant

## 2020-05-24 ENCOUNTER — Other Ambulatory Visit: Payer: Self-pay | Admitting: Radiology

## 2020-05-24 ENCOUNTER — Other Ambulatory Visit: Payer: Self-pay

## 2020-05-24 ENCOUNTER — Other Ambulatory Visit: Payer: Self-pay | Admitting: Family Medicine

## 2020-05-24 VITALS — BP 131/84 | HR 84 | Temp 100.1°F | Resp 18 | Ht 67.0 in | Wt 177.0 lb

## 2020-05-24 DIAGNOSIS — U071 COVID-19: Secondary | ICD-10-CM | POA: Insufficient documentation

## 2020-05-24 HISTORY — DX: COVID-19: U07.1

## 2020-05-24 LAB — POCT RAPID STREP A (OFFICE): Rapid Strep A Screen: NEGATIVE

## 2020-05-24 MED ORDER — CYCLOBENZAPRINE HCL 10 MG PO TABS: 10.0000 mg | ORAL_TABLET | Freq: Three times a day (TID) | ORAL | 1 refills | Status: DC | PRN

## 2020-05-24 MED FILL — CYCLOBENZAPRINE HCL 10 MG T: 10 | 30 days supply | Qty: 90 | Fill #0

## 2020-05-24 NOTE — Patient Instructions (Addendum)
Your rapid Covid test was Positive. I encourage you to stay well-hydrated, rested, use over-the-counter cold medications to help yourself feel better.  I am sending your information to the monoclonal antibody infusion center, they will be contacting you for follow-up and reviewing if you qualify for this treatment.  We will have a virtual follow-up with you on Tuesday, January 4 2 PM.  I hope that you feel better soon  Kennieth Rad, PA-C Physician [place] [email or web address]     Person Under Monitoring Name: Penny Thomas  Location: [place] [id number]   Infection Prevention Recommendations for Individuals Confirmed to have, or Being Evaluated for, 2019 Novel Coronavirus (COVID-19) Infection Who Receive Care at Home  Individuals who are confirmed to have, or are being evaluated for, COVID-19 should follow the prevention steps below until a healthcare provider or local or state health department says they can return to normal activities.  Stay home except to get medical care You should restrict activities outside your home, except for getting medical care. Do not go to work, school, or public areas, and do not use public transportation or taxis.  Call ahead before visiting your doctor Before your medical appointment, call the healthcare provider and tell them that you have, or are being evaluated for, COVID-19 infection. This will help the healthcare provider's office take steps to keep other people from getting infected. Ask your healthcare provider to call the local or state health department.  Monitor your symptoms Seek prompt medical attention if your illness is worsening (e.g., difficulty breathing). Before going to your medical appointment, call the healthcare provider and tell them that you have, or are being evaluated for, COVID-19 infection. Ask your healthcare  provider to call the local or state health department.  Wear a facemask You should wear a facemask that covers your nose and mouth when you are in the same room with other people and when you visit a healthcare provider. People who live with or visit you should also wear a facemask while they are in the same room with you.  Separate yourself from other people in your home As much as possible, you should stay in a different room from other people in your home. Also, you should use a separate bathroom, if available.  Avoid sharing household items You should not share dishes, drinking glasses, cups, eating utensils, towels, bedding, or other items with other people in your home. After using these items, you should wash them thoroughly with soap and water.  Cover your coughs and sneezes Cover your mouth and nose with a tissue when you cough or sneeze, or you can cough or sneeze into your sleeve. Throw used tissues in a lined trash can, and immediately wash your hands with soap and water for at least 20 seconds or use an alcohol-based hand rub.  Wash your [facility] your hands often and thoroughly with soap and water for at least 20 seconds. You can use an alcohol-based hand sanitizer if soap and water are not available and if your hands are not visibly dirty. Avoid touching your eyes, nose, and mouth with unwashed hands.   Prevention Steps for Caregivers and Household Members of Individuals Confirmed to have, or Being Evaluated for, COVID-19 Infection Being Cared for in the Home  If you live with, or provide care at home for, a person confirmed to have, or being evaluated for, COVID-19 infection please follow these guidelines to prevent infection:  Follow healthcare  provider's instructions Make sure that you understand and can help the patient follow any healthcare provider instructions for all care.  Provide for the patient's basic needs You should help the patient with basic needs  in the home and provide support for getting groceries, prescriptions, and other personal needs.  Monitor the patient's symptoms If they are getting sicker, call his or her medical provider and tell them that the patient has, or is being evaluated for, COVID-19 infection. This will help the healthcare provider's office take steps to keep other people from getting infected. Ask the healthcare provider to call the local or state health department.  Limit the number of people who have contact with the patient  If possible, have only one caregiver for the patient.  Other household members should stay in another home or place of residence. If this is not possible, they should stay  in another room, or be separated from the patient as much as possible. Use a separate bathroom, if available.  Restrict visitors who do not have an essential need to be in the home.  Keep older adults, very young children, and other sick people away from the patient Keep older adults, very young children, and those who have compromised immune systems or chronic health conditions away from the patient. This includes people with chronic heart, lung, or kidney conditions, diabetes, and cancer.  Ensure good ventilation Make sure that shared spaces in the home have good air flow, such as from an air conditioner or an opened window, weather permitting.  Wash your hands often  Wash your hands often and thoroughly with soap and water for at least 20 seconds. You can use an alcohol based hand sanitizer if soap and water are not available and if your hands are not visibly dirty.  Avoid touching your eyes, nose, and mouth with unwashed hands.  Use disposable paper towels to dry your hands. If not available, use dedicated cloth towels and replace them when they become wet.  Wear a facemask and gloves  Wear a disposable facemask at all times in the room and gloves when you touch or have contact with the patient's blood,  body fluids, and/or secretions or excretions, such as sweat, saliva, sputum, nasal mucus, vomit, urine, or feces.  Ensure the mask fits over your nose and mouth tightly, and do not touch it during use.  Throw out disposable facemasks and gloves after using them. Do not reuse.  Wash your hands immediately after removing your facemask and gloves.  If your personal clothing becomes contaminated, carefully remove clothing and launder. Wash your hands after handling contaminated clothing.  Place all used disposable facemasks, gloves, and other waste in a lined container before disposing them with other household waste.  Remove gloves and wash your hands immediately after handling these items.  Do not share dishes, glasses, or other household items with the patient  Avoid sharing household items. You should not share dishes, drinking glasses, cups, eating utensils, towels, bedding, or other items with a patient who is confirmed to have, or being evaluated for, COVID-19 infection.  After the person uses these items, you should wash them thoroughly with soap and water.  Wash laundry thoroughly  Immediately remove and wash clothes or bedding that have blood, body fluids, and/or secretions or excretions, such as sweat, saliva, sputum, nasal mucus, vomit, urine, or feces, on them.  Wear gloves when handling laundry from the patient.  Read and follow directions on labels of laundry or clothing items  and detergent. In general, wash and dry with the warmest temperatures recommended on the label.  Clean all areas the individual has used often  Clean all touchable surfaces, such as counters, tabletops, doorknobs, bathroom fixtures, toilets, phones, keyboards, tablets, and bedside tables, every day. Also, clean any surfaces that may have blood, body fluids, and/or secretions or excretions on them.  Wear gloves when cleaning surfaces the patient has come in contact with.  Use a diluted bleach solution  (e.g., dilute bleach with 1 part bleach and 10 parts water) or a household disinfectant with a label that says EPA-registered for coronaviruses. To make a bleach solution at home, add 1 tablespoon of bleach to 1 quart (4 cups) of water. For a larger supply, add  cup of bleach to 1 gallon (16 cups) of water.  Read labels of cleaning products and follow recommendations provided on product labels. Labels contain instructions for safe and effective use of the cleaning product including precautions you should take when applying the product, such as wearing gloves or eye protection and making sure you have good ventilation during use of the product.  Remove gloves and wash hands immediately after cleaning.  Monitor yourself for signs and symptoms of illness Caregivers and household members are considered close contacts, should monitor their health, and will be asked to limit movement outside of the home to the extent possible. Follow the monitoring steps for close contacts listed on the symptom monitoring form.   ? If you have additional questions, contact your local health department or call the epidemiologist on call at [phone] (available 24/7). ? This guidance is subject to change. For the most up-to-date guidance from [facility], please refer to their website: [email or web address]

## 2020-05-24 NOTE — Progress Notes (Signed)
New Patient Office Visit  Subjective:  Patient ID: Penny Thomas, female    DOB: February 23, 1986  Age: 34 y.o. MRN: [id number]  CC:  Chief Complaint  Patient presents with  . Sore Throat    HPI Penny Thomas reports that she has been having a sore throat, describes it as sandpaper and burning.  Reports that this started yesterday, states that she did not have a voice yesterday.  States that she has been having a productive cough with clear sputum.  Has been gargling but feels that made it worse.  Tried tylenol without relief. 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  Denies history of Covid vaccine    Past Medical History:  Diagnosis Date  . Allergy   . Anemia    takes iron supplement  . Anxiety   . Asthma   . Asthma    prn inhaler  . Blood transfusion 2007  . Blood transfusion 2010  . Blood transfusion without reported diagnosis   . 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 Hemorrhage in uterus 01-20-09  . Hypertension   . Migraines   . 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 Pre-diabetes   . Seasonal allergies   . Sickle cell anemia (HCC)    trait  . Sickle cell trait [facility])     Past Surgical History:  Procedure Laterality Date  . CESAREAN SECTION  09-28-05  . CESAREAN SECTION  01-20-09  . CESAREAN SECTION    . ENDOMETRIAL ABLATION  10/2011   HEROPTIOIN  . KNEE ARTHROSCOPY Right 02/16/2014   Procedure: RIGHT ARTHROSCOPY KNEE;  Surgeon: Nestor Lewandowsky, MD;  Location: [facility];  Service: Orthopedics;  Laterality: Right;  . POLYP REMOVAL  5-08   ?CERVICAL OR UTERINE POLYP  . ROBOTIC ASSISTED LAPAROSCOPIC HYSTERECTOMY AND SALPINGECTOMY Bilateral 12/01/2019   Procedure: XI ROBOTIC ASSISTEDTOTAL TOTAL LAPAROSCOPIC HYSTERECTOMY AND RIGHT SALPINGECTOMY , EXTENSIVE LYSIS OF ADHESIONS;  Surgeon: Genia Del, MD;  Location:  [facility];  Service: Gynecology;  Laterality: Bilateral;  request 8:30am OR time. Requests 2 hours  . TUBAL LIGATION  01-20-09   DONE WITH C-SECTION  . WISDOM TOOTH EXTRACTION Right 2014    Family History  Problem Relation Age of Onset  . Fibromyalgia Mother   . Multiple sclerosis Mother   . Asthma Father   . Healthy Maternal Grandmother   . Healthy Maternal Grandfather   . Healthy Paternal Grandmother   . Healthy Paternal Grandfather   . Endometriosis Sister   . Healthy Brother   . Healthy Child     Social History   Socioeconomic History  . Marital status: Married    Spouse name: Not on file  . Number of children: 4  . Years of education: 55  . Highest education level: Associate degree: academic program  Occupational History  . Occupation: NT/Transporter  Tobacco Use  . Smoking status: Never Smoker  . Smokeless tobacco: Never Used  Vaping Use  . Vaping Use: Never used  Substance and Sexual Activity  . Alcohol use: Yes    Alcohol/week: 0.0 standard drinks    Comment: seldom  . Drug use: No  . Sexual activity: Yes    Birth control/protection: Surgical, None    Comment: TUBAL LIGATION, INTERCOURSE AGE 78, SEXUAL PARTNERS 5  Other Topics Concern  . Not on file  Social History Narrative   Fun: Baking, playing with the children.   Denies abuse and feels safe at home.       Pt does not drink coffee, tea or soda   Pt lives with spouse and 4 children   Pt is right handed   She has a associates degree   One story home       Social Determinants of Corporate investment banker Strain: Not on file  Food Insecurity: Not on file  Transportation Needs: Not on file  Physical Activity: Not on file  Stress: Not on file  Social Connections: Not on file  Intimate Partner Violence: Not on file    ROS Review of Systems  Constitutional: Positive for fever. Negative for chills and fatigue.  HENT: Positive for congestion, sore throat and trouble  swallowing. Negative for ear pain, sinus pressure and sinus pain.   Eyes: Negative.   Respiratory: Positive for cough. Negative for shortness of breath and wheezing.   Cardiovascular: Negative for chest pain and palpitations.  Gastrointestinal: Negative.   Endocrine: Negative.   Genitourinary: Negative.   Musculoskeletal: Negative.   Skin: Negative.   Allergic/Immunologic: Negative.   Neurological: Negative.   Hematological: Negative.   Psychiatric/Behavioral: Negative.     Objective:   Today's Vitals: BP 131/84 (BP Location: Left Arm, Patient Position: Sitting, Cuff Size: Normal)   Pulse 84   Temp 100.1 F (37.8 C) (Oral)   Resp 18   Ht 5\' 7"  (1.702 m)   Wt 177 lb (80.3 kg)   LMP 11/29/2015   SpO2 100%   BMI 27.72 kg/m   Physical Exam Constitutional:      General: She is not in acute distress.    Appearance: Normal appearance. She is well-developed. She is not ill-appearing.  HENT:     Head: Normocephalic.     Right Ear: Tympanic membrane, ear canal and external ear normal.     Left Ear: Tympanic membrane, ear canal and external ear normal.     Nose: Congestion present.     Right Turbinates: Swollen.     Left Turbinates: Swollen.     Right Sinus: No maxillary sinus tenderness or frontal sinus tenderness.     Left Sinus: No maxillary sinus tenderness or frontal sinus tenderness.     Mouth/Throat:     Mouth: Mucous membranes are moist.     Pharynx: Posterior oropharyngeal erythema present. No oropharyngeal exudate.     Tonsils: No tonsillar exudate or tonsillar abscesses. 1+ on the right. 1+ on the left.  Eyes:     Conjunctiva/sclera: Conjunctivae normal.     Pupils: Pupils are equal, round, and reactive to light.  Cardiovascular:     Rate and Rhythm: Normal rate and regular rhythm.     Heart sounds: Normal heart sounds.  Pulmonary:     Effort: Pulmonary effort is normal.     Breath sounds: Normal breath sounds.  Abdominal:     General: Bowel sounds are normal.      Palpations: Abdomen is soft.  Musculoskeletal:     Cervical back: Normal range of motion.  Lymphadenopathy:     Cervical: Cervical adenopathy present.  Skin:    General: Skin is warm and dry.  Neurological:     General: No focal deficit present.     Mental Status: She is alert and oriented to person, place, and time.  Psychiatric:        Mood and Affect: Mood normal.  Behavior: Behavior normal.     Assessment & Plan:   Problem List Items Addressed This Visit   None   Visit Diagnoses    COVID-19    -  Primary   Relevant Orders   Rapid Strep A (Completed)      Outpatient Encounter Medications as of 05/24/2020  Medication Sig  . albuterol (PROVENTIL HFA;VENTOLIN HFA) 108 (90 Base) MCG/ACT inhaler Inhale 1-2 puffs into the lungs every 6 (six) hours as needed for wheezing or shortness of breath.  . cetirizine (ZYRTEC) 10 MG tablet Take 1 tablet (10 mg total) by mouth daily.  . cyclobenzaprine (FLEXERIL) 10 MG tablet Take 1 tablet (10 mg total) by mouth 3 (three) times daily as needed for muscle spasms.  Marland Kitchen esomeprazole (NEXIUM) 40 MG capsule TAKE 1 CAPSULE BY MOUTH ONCE A DAY AT 12 NOON  . Lasmiditan Succinate (REYVOW) 100 MG TABS Take 1 tablet by mouth daily as needed (No more than 1 tab in 24 h).  . naproxen (NAPROSYN) 500 MG tablet Take 1 tablet (500 mg total) by mouth every 12 (twelve) hours as needed.  . propranolol ER (INDERAL LA) 80 MG 24 hr capsule Take 1 capsule (80 mg total) by mouth daily.  . sertraline (ZOLOFT) 50 MG tablet Take 1 tablet (50 mg total) by mouth at bedtime.  . simethicone (MYLICON) 409 MG chewable tablet Chew 125 mg by mouth in the morning and at bedtime.  . SUMAtriptan 6 MG/0.5ML SOAJ Inject 6 mg into the skin as needed (May repeat in one hour. Maximum 2 injections in 24 hours.).  Marland Kitchen celecoxib (CELEBREX) 100 MG capsule Take 1 capsule (100 mg total) by mouth 2 (two) times daily. As needed for pain (Patient not taking: Reported on 05/24/2020)  .  olopatadine (PATANOL) 0.1 % ophthalmic solution Place 1 drop into both eyes 2 (two) times daily as needed for allergies. (Patient not taking: No sig reported)  . [DISCONTINUED] fluconazole (DIFLUCAN) 150 MG tablet Take one tab po daily x 3 days.   No facility-administered encounter medications on file as of 05/24/2020.   1. COVID-19 Rapid strep negative, rapid Covid positive.  Patient evaluated, tested and sent home with instructions for home care and Quarantine. Instructed to seek further care if symptoms worsen.  Is set up with follow up for a virtual visit with [facility] On May 30, 2020.  Referred to [facility]   - Rapid Strep A    I have reviewed the patient's medical history (PMH, PSH, Social History, Family History, Medications, and allergies) , and have been updated if relevant. I spent 30 minutes reviewing chart and  face to face time with patient.    Follow-up: Return if symptoms worsen or fail to improve.   Loraine Grip Mayers, PA-C

## 2020-05-24 NOTE — Progress Notes (Signed)
Patient verified DOB Patient complains of hoarseness and losing her voice yesterday. Patient complains of swallowing feeling like "sand paper" Patient complains of burning while drinking. Patient shares no one around her has the symptoms. Patient complains of nasal drainage and productive cough with clear liquid. Patient has used hot salt water which caused burning. Patient complains of this same symptom a few years ago and was diagnosed with strep.

## 2020-05-25 ENCOUNTER — Encounter: Payer: Self-pay | Admitting: Physician Assistant

## 2020-05-25 ENCOUNTER — Other Ambulatory Visit (HOSPITAL_COMMUNITY): Payer: Self-pay | Admitting: Physician Assistant

## 2020-05-25 LAB — POC COVID19 BINAXNOW: SARS Coronavirus 2 Ag: POSITIVE — AB

## 2020-05-25 NOTE — Progress Notes (Unsigned)
I connected by phone with Penny Thomas on 05/25/2020 at 12:32 PM to discuss the potential use of a new treatment for mild to moderate COVID-19 viral infection in non-hospitalized patients.  This patient is a 34 y.o. female that meets the FDA criteria for Emergency Use Authorization of COVID monoclonal antibody casirivimab/imdevimab, bamlanivimab/etesevimab, or sotrovimab.  Has a (+) direct SARS-CoV-2 viral test result  Has mild or moderate COVID-19   Is NOT hospitalized due to COVID-19  Is within 10 days of symptom onset  Has at least one of the high risk factor(s) for progression to severe COVID-19 and/or hospitalization as defined in EUA.  Specific high risk criteria : BMI > 25 and Other high risk medical condition per CDC:  unvaccinated   I have spoken and communicated the following to the patient or parent/caregiver regarding COVID monoclonal antibody treatment:  1. FDA has authorized the emergency use for the treatment of mild to moderate COVID-19 in adults and pediatric patients with positive results of direct SARS-CoV-2 viral testing who are 85 years of age and older weighing at least 40 kg, and who are at high risk for progressing to severe COVID-19 and/or hospitalization.  2. The significant known and potential risks and benefits of COVID monoclonal antibody, and the extent to which such potential risks and benefits are unknown.  3. Information on available alternative treatments and the risks and benefits of those alternatives, including clinical trials.  4. Patients treated with COVID monoclonal antibody should continue to self-isolate and use infection control measures (e.g., wear mask, isolate, social distance, avoid sharing personal items, clean and disinfect "high touch" surfaces, and frequent handwashing) according to CDC guidelines.   5. The patient or parent/caregiver has the option to accept or refuse COVID monoclonal antibody treatment.  After reviewing  this information with the patient, the patient has agreed to receive one of the available covid 19 monoclonal antibodies and will be provided an appropriate fact sheet prior to infusion. Jodell Cipro, PA-C 05/25/2020 12:32 PM

## 2020-05-26 ENCOUNTER — Ambulatory Visit (HOSPITAL_COMMUNITY)
Admission: RE | Admit: 2020-05-26 | Discharge: 2020-05-26 | Disposition: A | Discharge status: Home / Self Care | Payer: 59 | Source: Ambulatory Visit | Attending: Pulmonary Disease | Admitting: Pulmonary Disease

## 2020-05-26 DIAGNOSIS — U071 COVID-19: Secondary | ICD-10-CM | POA: Diagnosis not present

## 2020-05-26 MED ORDER — FAMOTIDINE IN NACL 20-0.9 MG/50ML-% IV SOLN: 20.0000 mg | Freq: Once | INTRAVENOUS | Status: DC | PRN

## 2020-05-26 MED ORDER — DIPHENHYDRAMINE HCL 50 MG/ML IJ SOLN: 50.0000 mg | Freq: Once | INTRAMUSCULAR | Status: DC | PRN

## 2020-05-26 MED ORDER — ALBUTEROL SULFATE HFA 108 (90 BASE) MCG/ACT IN AERS: 2.0000 | INHALATION_SPRAY | Freq: Once | RESPIRATORY_TRACT | Status: DC | PRN

## 2020-05-26 MED ORDER — SODIUM CHLORIDE 0.9 % IV SOLN: Freq: Once | INTRAVENOUS | Status: AC

## 2020-05-26 MED ORDER — EPINEPHRINE 0.3 MG/0.3ML IJ SOAJ: 0.3000 mg | Freq: Once | INTRAMUSCULAR | Status: DC | PRN

## 2020-05-26 MED ORDER — SODIUM CHLORIDE 0.9 % IV SOLN: INTRAVENOUS | Status: DC | PRN

## 2020-05-26 MED ORDER — METHYLPREDNISOLONE SODIUM SUCC 125 MG IJ SOLR: 125.0000 mg | Freq: Once | INTRAMUSCULAR | Status: DC | PRN

## 2020-05-26 NOTE — Progress Notes (Signed)
Patient reviewed Fact Sheet for Patients, Parents, and Caregivers for Emergency Use Authorization (EUA) of REGEN-COV for the Treatment of Coronavirus. Patient also reviewed and is agreeable to the estimated cost of treatment. Patient is agreeable to proceed.    

## 2020-05-26 NOTE — Discharge Instructions (Signed)
10 Things You Can Do to Manage Your COVID-19 Symptoms at Home If you have possible or confirmed COVID-19: 1. Stay home from work and school. And stay away from other public places. If you must go out, avoid using any kind of public transportation, ridesharing, or taxis. 2. Monitor your symptoms carefully. If your symptoms get worse, call your healthcare provider immediately. 3. Get rest and stay hydrated. 4. If you have a medical appointment, call the healthcare provider ahead of time and tell them that you have or may have COVID-19. 5. For medical emergencies, call 911 and notify the dispatch personnel that you have or may have COVID-19. 6. Cover your cough and sneezes with a tissue or use the inside of your elbow. 7. Wash your hands often with soap and water for at least 20 seconds or clean your hands with an alcohol-based hand sanitizer that contains at least 60% alcohol. 8. As much as possible, stay in a specific room and away from other people in your home. Also, you should use a separate bathroom, if available. If you need to be around other people in or outside of the home, wear a mask. 9. Avoid sharing personal items with other people in your household, like dishes, towels, and bedding. 10. Clean all surfaces that are touched often, like counters, tabletops, and doorknobs. Use household cleaning sprays or wipes according to the label instructions. cdc.gov/coronavirus 11/25/2018 This information is not intended to replace advice given to you by your health care provider. Make sure you discuss any questions you have with your health care provider. Document Revised: 04/29/2019 Document Reviewed: 04/29/2019 Elsevier Patient Education  2020 Elsevier Inc. What types of side effects do monoclonal antibody drugs cause?  Common side effects  In general, the more common side effects caused by monoclonal antibody drugs include: . Allergic reactions, such as hives or itching . Flu-like signs and  symptoms, including chills, fatigue, fever, and muscle aches and pains . Nausea, vomiting . Diarrhea . Skin rashes . Low blood pressure   The CDC is recommending patients who receive monoclonal antibody treatments wait at least 90 days before being vaccinated.  Currently, there are no data on the safety and efficacy of mRNA COVID-19 vaccines in persons who received monoclonal antibodies or convalescent plasma as part of COVID-19 treatment. Based on the estimated half-life of such therapies as well as evidence suggesting that reinfection is uncommon in the 90 days after initial infection, vaccination should be deferred for at least 90 days, as a precautionary measure until additional information becomes available, to avoid interference of the antibody treatment with vaccine-induced immune responses. If you have any questions or concerns after the infusion please call the Advanced Practice Provider on call at [phone]. This number is ONLY intended for your use regarding questions or concerns about the infusion post-treatment side-effects.  Please do not provide this number to others for use. For return to work notes please contact your primary care provider.   If someone you know is interested in receiving treatment please have them call the COVID hotline at [phone].   

## 2020-05-26 NOTE — Progress Notes (Signed)
  Diagnosis: COVID-19  Physician: Asencion Noble, MD  Procedure: Covid Infusion Clinic Med: casirivimab\imdevimab infusion - Provided patient with casirivimab\imdevimab fact sheet for patients, parents and caregivers prior to infusion.  Complications: No immediate complications noted.  Discharge: Discharged home   Penny Thomas 05/26/2020

## 2020-05-30 DIAGNOSIS — Z03818 Encounter for observation for suspected exposure to other biological agents ruled out: Secondary | ICD-10-CM | POA: Diagnosis not present

## 2020-05-31 ENCOUNTER — Other Ambulatory Visit: Payer: Self-pay | Admitting: Physician Assistant

## 2020-05-31 ENCOUNTER — Telehealth: Payer: 59 | Admitting: Physician Assistant

## 2020-05-31 DIAGNOSIS — R059 Cough, unspecified: Secondary | ICD-10-CM | POA: Diagnosis not present

## 2020-05-31 DIAGNOSIS — Z8616 Personal history of COVID-19: Secondary | ICD-10-CM | POA: Insufficient documentation

## 2020-05-31 DIAGNOSIS — J0110 Acute frontal sinusitis, unspecified: Secondary | ICD-10-CM

## 2020-05-31 HISTORY — DX: Personal history of COVID-19: Z86.16

## 2020-05-31 HISTORY — DX: Acute frontal sinusitis, unspecified: J01.10

## 2020-05-31 HISTORY — DX: Cough, unspecified: R05.9

## 2020-05-31 MED ORDER — AZITHROMYCIN 250 MG PO TABS: ORAL_TABLET | ORAL | 0 refills | Status: DC

## 2020-05-31 MED ORDER — BENZONATATE 100 MG PO CAPS: 100.0000 mg | ORAL_CAPSULE | Freq: Two times a day (BID) | ORAL | 0 refills | Status: DC | PRN

## 2020-05-31 MED FILL — BENZONATATE 100 MG CAPS: 100 | 10 days supply | Qty: 20 | Fill #0

## 2020-05-31 MED FILL — AZITHROMYCIN 250 MG TABLET: 250 | 5 days supply | Qty: 6 | Fill #0

## 2020-05-31 NOTE — Progress Notes (Signed)
Established Patient Office Visit  Subjective:  Patient ID: Penny Thomas, female    DOB: Sep 12, 1985  Age: 35 y.o. MRN: [id number]  CC:  Chief Complaint  Patient presents with  . Covid Positive   Virtual Visit via Telephone Note  I connected with Penny Thomas on 05/31/20 at 10:30 AM EST by telephone and verified that I am speaking with the correct person using two identifiers.  Location: Patient: Home Provider: [facility]    I discussed the limitations, risks, security and privacy concerns of performing an evaluation and management service by telephone and the availability of in person appointments. I also discussed with the patient that there may be a patient responsible charge related to this service. The patient expressed understanding and agreed to proceed.   History of Present Illness:  HPI Penny Thomas states that she is still having nasal congestion, sinus pressure and cough with yellow mucous.  States that she is using Tylenol, ibuprofen, Sudafed, NyQuil, and Zyrtec without much relief.  Reports that she did get the monoclonal antibody infusion treatment on May 26, 2020, states that her body aches did resolve after that.    Observations/Objective: Medical history and current medications reviewed, no physical exam completed     Past Medical History:  Diagnosis Date  . Allergy   . Anemia    takes iron supplement  . Anxiety   . Asthma   . Asthma    prn inhaler  . Blood transfusion 2007  . Blood transfusion 2010  . Blood transfusion without reported diagnosis   . 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 Hemorrhage in uterus 01-20-09  . Hypertension   . Migraines   . 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 Pre-diabetes   . Seasonal allergies   . Sickle cell anemia (HCC)    trait  . Sickle cell trait [facility])     Past Surgical History:  Procedure Laterality Date  . CESAREAN SECTION  09-28-05  .  CESAREAN SECTION  01-20-09  . CESAREAN SECTION    . ENDOMETRIAL ABLATION  10/2011   HEROPTIOIN  . KNEE ARTHROSCOPY Right 02/16/2014   Procedure: RIGHT ARTHROSCOPY KNEE;  Surgeon: Kerin Salen, MD;  Location: [place];  Service: Orthopedics;  Laterality: Right;  . POLYP REMOVAL  5-08   ?CERVICAL OR UTERINE POLYP  . ROBOTIC ASSISTED LAPAROSCOPIC HYSTERECTOMY AND SALPINGECTOMY Bilateral 12/01/2019   Procedure: XI ROBOTIC ASSISTEDTOTAL TOTAL LAPAROSCOPIC HYSTERECTOMY AND RIGHT SALPINGECTOMY , EXTENSIVE LYSIS OF ADHESIONS;  Surgeon: [name], MD;  Location: [place];  Service: Gynecology;  Laterality: Bilateral;  request 8:30am OR time. Requests 2 hours  . TUBAL LIGATION  01-20-09   DONE WITH C-SECTION  . WISDOM TOOTH EXTRACTION Right 2014    Family History  Problem Relation Age of Onset  . Fibromyalgia Mother   . Multiple sclerosis Mother   . Asthma Father   . Healthy Maternal Grandmother   . Healthy Maternal Grandfather   . Healthy Paternal Grandmother   . Healthy Paternal Grandfather   . Endometriosis Sister   . Healthy Brother   . Healthy Child     Social History   Socioeconomic History  . Marital status: Married    Spouse name: Not on file  . Number of children: 4  . Years of education: 22  . Highest education level: Associate degree: academic program  Occupational History  . Occupation: NT/Transporter  Tobacco Use  . Smoking  status: Never Smoker  . Smokeless tobacco: Never Used  Vaping Use  . Vaping Use: Never used  Substance and Sexual Activity  . Alcohol use: Yes    Alcohol/week: 0.0 standard drinks    Comment: seldom  . Drug use: No  . Sexual activity: Yes    Birth control/protection: Surgical, None    Comment: TUBAL LIGATION, INTERCOURSE AGE 48, SEXUAL PARTNERS 5  Other Topics Concern  . Not on file  Social History Narrative   Fun: Baking, playing with the children.   Denies abuse and feels safe at home.       Pt  does not drink coffee, tea or soda   Pt lives with spouse and 4 children   Pt is right handed   She has a associates degree   One story home       Social Determinants of Corporate investment bankerHealth   Financial Resource Strain: Not on file  Food Insecurity: Not on file  Transportation Needs: Not on file  Physical Activity: Not on file  Stress: Not on file  Social Connections: Not on file  Intimate Partner Violence: Not on file    Outpatient Medications Prior to Visit  Medication Sig Dispense Refill  . albuterol (PROVENTIL HFA;VENTOLIN HFA) 108 (90 Base) MCG/ACT inhaler Inhale 1-2 puffs into the lungs every 6 (six) hours as needed for wheezing or shortness of breath. 1 Inhaler 1  . cetirizine (ZYRTEC) 10 MG tablet Take 1 tablet (10 mg total) by mouth daily. 30 tablet 5  . cyclobenzaprine (FLEXERIL) 10 MG tablet Take 1 tablet (10 mg total) by mouth 3 (three) times daily as needed for muscle spasms. 90 tablet 1  . esomeprazole (NEXIUM) 40 MG capsule TAKE 1 CAPSULE BY MOUTH ONCE A DAY AT 12 NOON 30 capsule 1  . Lasmiditan Succinate (REYVOW) 100 MG TABS Take 1 tablet by mouth daily as needed (No more than 1 tab in 24 h). 30 tablet 3  . naproxen (NAPROSYN) 500 MG tablet Take 1 tablet (500 mg total) by mouth every 12 (twelve) hours as needed. 20 tablet 5  . propranolol ER (INDERAL LA) 80 MG 24 hr capsule Take 1 capsule (80 mg total) by mouth daily. 30 capsule 5  . celecoxib (CELEBREX) 100 MG capsule Take 1 capsule (100 mg total) by mouth 2 (two) times daily. As needed for pain (Patient not taking: No sig reported) 60 capsule 2  . olopatadine (PATANOL) 0.1 % ophthalmic solution Place 1 drop into both eyes 2 (two) times daily as needed for allergies. (Patient not taking: Reported on 05/31/2020) 5 mL 5  . sertraline (ZOLOFT) 50 MG tablet Take 1 tablet (50 mg total) by mouth at bedtime. 30 tablet 5  . simethicone (MYLICON) 125 MG chewable tablet Chew 125 mg by mouth in the morning and at bedtime.    . SUMAtriptan 6  MG/0.5ML SOAJ Inject 6 mg into the skin as needed (May repeat in one hour. Maximum 2 injections in 24 hours.). 3 mL 5   No facility-administered medications prior to visit.    Allergies  Allergen Reactions  . Dewayne Hatchrilissa [Elagolix] Other (See Comments)    Hallucinations    ROS Review of Systems  Constitutional: Negative for chills, fatigue and fever.  HENT: Positive for congestion and sinus pressure. Negative for sore throat.   Eyes: Negative.   Respiratory: Positive for cough.   Cardiovascular: Negative.   Gastrointestinal: Negative for abdominal pain, nausea and vomiting.  Endocrine: Negative.   Genitourinary: Negative.  Musculoskeletal: Negative for myalgias.  Skin: Negative.   Allergic/Immunologic: Negative.   Neurological: Negative for headaches.  Hematological: Negative.   Psychiatric/Behavioral: Negative.       Objective:     LMP 11/29/2015  Wt Readings from Last 3 Encounters:  05/24/20 177 lb (80.3 kg)  05/22/20 182 lb 3.2 oz (82.6 kg)  01/03/20 186 lb 6.4 oz (84.6 kg)     Health Maintenance Due  Topic Date Due  . Hepatitis C Screening  Never done  . COVID-19 Vaccine (1) Never done  . HIV Screening  Never done  . INFLUENZA VACCINE  12/26/2019    There are no preventive care reminders to display for this patient.  Lab Results  Component Value Date   TSH 1.13 03/20/2020   Lab Results  Component Value Date   WBC 6.5 01/03/2020   HGB 13.3 01/03/2020   HCT 39.8 01/03/2020   MCV 85 01/03/2020   PLT 281 01/03/2020   Lab Results  Component Value Date   NA 136 01/03/2020   K 4.4 01/03/2020   CO2 22 01/03/2020   GLUCOSE 109 (H) 01/03/2020   BUN 13 01/03/2020   CREATININE 0.79 01/03/2020   BILITOT 0.6 01/03/2020   ALKPHOS 77 01/03/2020   AST 18 01/03/2020   ALT 18 01/03/2020   PROT 7.3 01/03/2020   ALBUMIN 4.5 01/03/2020   CALCIUM 9.6 01/03/2020   ANIONGAP 9 11/19/2019   GFR 89.68 03/12/2016   Lab Results  Component Value Date   CHOL 225  (H) 08/10/2019   Lab Results  Component Value Date   HDL 63 08/10/2019   Lab Results  Component Value Date   LDLCALC 140 (H) 08/10/2019   Lab Results  Component Value Date   TRIG 104 08/10/2019   Lab Results  Component Value Date   CHOLHDL 3.6 08/10/2019   Lab Results  Component Value Date   HGBA1C 5.3 08/10/2019      Assessment & Plan:   Problem List Items Addressed This Visit      Respiratory   Acute non-recurrent frontal sinusitis   Relevant Medications   azithromycin (ZITHROMAX) 250 MG tablet   benzonatate (TESSALON) 100 MG capsule     Other   History of COVID-19 - Primary   Cough   Relevant Medications   benzonatate (TESSALON) 100 MG capsule     Assessment and Plan: 1. History of COVID-19 Patient education given on safe return to work, continue last 2 days of quarantine.  2. Acute non-recurrent frontal sinusitis Trial azithromycin, encouraged patient to continue over-the-counter cold medications as needed, continue proper hydration and rest - azithromycin (ZITHROMAX) 250 MG tablet; Take 2 tabs PO day 1 then take 1 tab PO daily  Dispense: 6 tablet; Refill: 0  3. Cough  - benzonatate (TESSALON) 100 MG capsule; Take 1 capsule (100 mg total) by mouth 2 (two) times daily as needed for cough.  Dispense: 20 capsule; Refill: 0   Follow Up Instructions:    I discussed the assessment and treatment plan with the patient. The patient was provided an opportunity to ask questions and all were answered. The patient agreed with the plan and demonstrated an understanding of the instructions.   The patient was advised to call back or seek an in-person evaluation if the symptoms worsen or if the condition fails to improve as anticipated.  I provided 21 minutes of non-face-to-face time during this encounter.       Meds ordered this encounter  Medications  .  azithromycin (ZITHROMAX) 250 MG tablet    Sig: Take 2 tabs PO day 1 then take 1 tab PO daily     Dispense:  6 tablet    Refill:  0    Order Specific Question:   Supervising Provider    Answer:   Shan Levans E [[id number]]  . benzonatate (TESSALON) 100 MG capsule    Sig: Take 1 capsule (100 mg total) by mouth 2 (two) times daily as needed for cough.    Dispense:  20 capsule    Refill:  0    Order Specific Question:   Supervising Provider    Answer:   Storm Frisk [[id number]]    Follow-up: Return if symptoms worsen or fail to improve.    Kasandra Knudsen Zadin Lange, PA-C

## 2020-05-31 NOTE — Progress Notes (Signed)
Patient verified DOB Patient denies sore throat at this time. Patient complains of yellow mucous with cough. Patient has been isolating at home and denies any household members with symptoms. Patient has taken tylenol, ibuprofen, sudafed and nyquill along with allergy medication. Patient denies N/V/ diarrhea. Patient is able to taste and smell and has a slight appetite.

## 2020-05-31 NOTE — Patient Instructions (Signed)
I have sent azithromycin and Tessalon Perles to your pharmacy to help you with your upper respiratory symptoms.  Continue using the over-the-counter cold medications as needed.  Because you reported that your symptoms of Covid began on 05/23/2020, the last day of your 10-day quarantine period will be June 01 2020.  You may still test positive up to 90 days after your initial symptoms began.  You did complete the monoclonal antibody infusion treatment.  I hope that you feel better soon, please let us know if there is anything else we can do for you  Kennieth Rad, PA-C Physician Assistant [place] [email or web address]    Infection Prevention in the Home If you have an infection, may have been exposed to an infection, or are taking care of someone who has an infection, it is important to know how to keep the infection from spreading. Follow your health care provider's instructions and use these guidelines to help stop the spread of infection. How infections are spread In order for an infection to spread, the following must be present:  A germ. This may be a virus, bacteria, fungus, or parasite.  A place for the germ to live. This may be: ? On or in a person, animal, plant, or food. ? In soil or water. ? On surfaces, such as a door handle.  A person or animal who can develop a disease if the germ enters the body (host). The host does not have resistance to the germ.  A way for the germ to enter the host. This may occur by: ? Direct contact with an infected person or animal. This can happen through shaking hands or hugging. Some germs can also travel through the air and spread to others. This can happen when an infected person coughs or sneezes on or near other people. ? Indirect contact. This occurs when the germ enters the host through contact with an infected object. Examples include:  Eating or drinking food or water that has  the germ (is contaminated).  Touching a contaminated surface with your hands, and then touching your face, eyes, nose, or mouth. Supplies needed:  Soap.  Alcohol-based hand sanitizer.  Standard cleaning products.  Disinfectants, such as bleach.  Reusable cleaning cloths, sponges, or paper towels.  Disposable or reusable utility gloves. How to prevent infection from spreading There are several things that you can do to help prevent infection from spreading. Take these general actions Everyone should take the following actions to prevent the spread of infection:  Wash your hands often with soap and water for at least 20 seconds. If soap and water are not available, use alcohol-based hand sanitizer.  Avoid touching your face, mouth, nose, or eyes.  Cough or sneeze into a tissue, sleeve, or elbow instead of into your hand or into the air. ? If you cough or sneeze into a tissue, throw it away immediately and wash your hands.  Keep your bathroom clean  Provide soap.  Change towels and washcloths frequently.  Change toothbrushes often and store them separately in a clean, dry place.  Clean and disinfect all surfaces, including the toilet, floor, tub, shower, and sink.  Do not share personal items, such as razors, toothbrushes, deodorant, combs, brushes, towels, and washcloths. Maintain hygiene in the [facility] your hands before and after preparing food and before you eat.  Clean the inside of your refrigerator each week.  Keep your refrigerator set at 21F (4C) or less, and set your  freezer at 59F (-18C) or less.  Keep work surfaces clean. Disinfect them regularly.  Wash your dishes in hot, soapy water. Air-dry your dishes or use a dishwasher.  Do not share dishes or eating utensils. Handle food safely  Store food carefully.  Refrigerate leftovers promptly in covered containers.  Throw out stale or spoiled food.  Thaw foods in the refrigerator or  microwave, not at room temperature.  Serve foods at the proper temperature. Do not eat raw meat. Make sure it is cooked to the appropriate temperature. Cook eggs until they are firm.  Wash fruits and vegetables under running water.  Use separate cutting boards, plates, and utensils for raw foods and cooked foods.  Use a clean spoon each time you sample food while cooking. Do laundry the right way  Wear gloves if laundry is visibly soiled.  Do not shake soiled laundry. Doing that may send germs into the air.  Wash laundry in hot water.  If you cannot wash the laundry right away, place it in a plastic bag and wash it as soon as possible. Be careful around animals and pets  Wash your hands before and after touching animals.  If you have a pet, ensure that your pet stays clean. Do not let people with weak immune systems touch bird droppings, fish tank water, or a litter box. ? If you have a pet cage or litter box, be sure to clean it every day.  If you are sick, stay away from animals and have someone else care for them if possible. How to clean and disinfect objects and surfaces Precautions  Some disinfectants work for certain germs and not others. Read the manufacturer's instructions or read online resources to determine if the product you are using will work for the germ you are trying to remove.  If you choose to use bleach, use it safely. Never mix it with other cleaning products, especially those that contain ammonia. This mixture can create a dangerous gas that may be deadly.  Keep proper movement of fresh air in your home (ventilation).  Pour used mop water down the utility sink or toilet. Do not pour this water down the kitchen sink. Objects and surfaces   If surfaces are visibly soiled, clean them first with soap and water before disinfecting.  Disinfect surfaces that are frequently touched every day. This may include: ? Counters. ? Tables. ? Doorknobs. ? Sinks and  faucets. ? Electronics, such as:  Architectural technologist.  Remote controls.  Keyboards.  Computers and tablets. Cleaning supplies Some cleaning supplies can breed germs. Take good care of them to prevent germs from spreading. To do this:  Soak toilet brushes, mops, and sponges in bleach and water for 5 minutes after each use, or according to manufacturer's instructions.  Wash reusable cleaning cloths and sanitize sponges after each use.  Throw away disposable gloves after one use.  Replace reusable utility gloves if they are cracked or torn or if they start to peel. Additional actions if you are sick If you live with other people:   Avoid close contact with those around you. Stay at least 3 ft (1 m) away from others, if possible.  Use a separate bathroom, if possible.  If possible, sleep in a separate bedroom or in a separate bed to prevent infecting other household members. ? Change bedroom linens each week or whenever they are soiled.  Have everyone in the household wash hands often with soap and water. If soap and water are  not available, use alcohol-based hand sanitizer. In general:  Stay home except to get medical care. Call ahead before visiting your health care provider.  Ask others to get groceries and household supplies and to refill prescriptions for you.  Avoid public areas. Try not to take public transportation.  If you can, wear a mask if you need to go out of the house, or if you are in close contact with someone who is not sick.  Avoid visitors until you have completely recovered, or until you have no signs and symptoms of infection.  Avoid preparing food or providing care for others. If you must prepare food or provide care for others, wear a mask and wash your hands before and after doing these things. Where to find more information  Centers for Disease Control and Prevention: WorkDashboard.es  World Health Organization [facility]):  BeverageBargains.co.za  Association for Professionals in Infection Control and Epidemiology: professionals.site.DropOption.si Summary  It is important to know how to keep the infection from spreading.  Make sure everyone in your household washes their hands often with soap and water.  Disinfect surfaces that are frequently touched every day.  If you are sick, stay home except to get medical care. This information is not intended to replace advice given to you by your health care provider. Make sure you discuss any questions you have with your health care provider. Document Released: 02/20/2008 Document Revised: 09/08/2018 Document Reviewed: 08/07/2018 Elsevier Patient Education  2020 [employer].

## 2020-06-05 ENCOUNTER — Other Ambulatory Visit: Payer: 59

## 2020-06-15 MED FILL — SERTRALINE HCL 50 MG TABS: 50 | 30 days supply | Qty: 30 | Fill #3

## 2020-06-15 MED FILL — PROPRANOLOL HCL ER 80 MG CP: 80 | 30 days supply | Qty: 30 | Fill #3

## 2020-06-28 ENCOUNTER — Encounter: Payer: Self-pay | Admitting: Family Medicine

## 2020-06-28 ENCOUNTER — Ambulatory Visit: Payer: 59 | Admitting: Family Medicine

## 2020-06-28 ENCOUNTER — Other Ambulatory Visit: Payer: Self-pay

## 2020-06-28 ENCOUNTER — Other Ambulatory Visit: Payer: Self-pay | Admitting: Family Medicine

## 2020-06-28 DIAGNOSIS — M5442 Lumbago with sciatica, left side: Secondary | ICD-10-CM

## 2020-06-28 MED ORDER — TRAMADOL HCL 50 MG PO TABS: 50.0000 mg | ORAL_TABLET | Freq: Four times a day (QID) | ORAL | 0 refills | Status: DC | PRN

## 2020-06-28 MED ORDER — PREDNISONE 10 MG PO TABS: ORAL_TABLET | ORAL | 0 refills | Status: DC

## 2020-06-28 MED FILL — traMADol HCL 50 MG TABS: 50 | 7 days supply | Qty: 30 | Fill #0

## 2020-06-28 MED FILL — predniSONE 10 MG TABS: 10 | 12 days supply | Qty: 42 | Fill #0

## 2020-06-28 NOTE — Progress Notes (Signed)
Office Visit Note   Patient: Penny Thomas           Date of Birth: [birth year]           MRN: [id number] Visit Date: 06/28/2020 Requested by: Girtha Rm, NP-C [place],  [place] [address] PCP: Girtha Rm, NP-C  Subjective: Chief Complaint  Patient presents with  . Lower Back - Pain    Pain did get better since last visit (04/2019). Since last Friday, has had a constant throbbing pain at the base of the lower back. Pain shoots from left buttock to the middle of her back. Had one episode yesterday in which pain shot down the left thigh to the knee, with numbness/tingling in the knee area. Tried heat - helps her fall asleep.    HPI: 35yo F presenting to clinic with acute on chronic left sided lower back pain, with numbness into her left knee. Patient states that she had been doing 'great' after her last epidural injection, and isn't sure what she did on Friday (5 days ago) to cause this sudden exacerbation. She has tried Aleve, Tylenol, heating packs- with minimal improvement. States her pain has been so bad she has 'cried myself to sleep.' She tried doing some of the previously prescribed home exercises, but 'I got stuck on the floor.' Pain travels as far as her kneecap, but not beyond. Denies overt weakness in the leg, or any bowel/bladder dysfunction.               ROS:   All other systems were reviewed and are negative.  Objective: Vital Signs: LMP 11/29/2015   Physical Exam:  General:  Alert and oriented, in no acute distress. Pulm:  Breathing unlabored. Psy:  Normal mood, congruent affect. Skin:  Lower back with no bruising, rashes, or erythema. Overlying skin intact.   BACK EXAMINATION: Antalgic Gait, walking with rightward lean.  Normal Spinal curvature, without excessive lumbar lordosis, thoracic kyphosis, or scoliosis.   ROM: Endorses significant pain with extension beyond 10*. Also with pain in forward flexion, and approx 16' from  toe-touch.   Palpation: Significant lower lumbar paraspinal tenderness, at approximately L5 on left. No midline tenderness, no deformity or step-offs. Gluteus musculature and piriformis are also tender.   Strength: Hip flexion (L1), Hip Aduction (L2), Knee Extension (L3) are 5/5 Bilaterally Foot Inversion (L4), Dorsiflexion (L5), and Eversion (S1) 5/5 Bilaterally Sensation: Intact to light touch medial and lateral aspects of lower extremities, and lateral, dorsal, and medial aspects of foot.  Reflexes: Patellar (L4), and Ankle (S1) 2+ Bilaterally  Special Tests:  FABER and Gaenslens cause No SI Joint Pain SLR: Endorses a 'tingling' in her left ankle with left leg raise. No contralateral symptoms with right leg raise.    Imaging: No results found.  Assessment & Plan: 35yo F presenting to clinic with acute on chronic lumbar radiculopathy, with known L5-S1 disc protrusion. Previously had excellent success with ESI performed by [facility].  - Will try Steroid burst for acute nerve irritation - Referral placed for consideration of repeated ESI - Return precautions discussed      Procedures: No procedures performed        PMFS History: Patient Active Problem List   Diagnosis Date Noted  . History of COVID-19 05/31/2020  . Acute non-recurrent frontal sinusitis 05/31/2020  . Cough 05/31/2020  . COVID-19 05/24/2020  . Postoperative state 12/01/2019  . 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  . 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  . Panic  attacks 06/16/2019  . Situational anxiety 06/16/2019  . Great toe pain, left 06/16/2019  . History of gestational diabetes 12/02/2018  . Prediabetes 12/02/2018  . Mild persistent asthma without complication 38/45/3646  . Acute nasopharyngitis 12/11/2016  . Intractable chronic migraine without aura and without status migrainosus 06/23/2015  . Migraines 03/23/2015  . Right-sided low back pain without sciatica 03/23/2015  . Muscle cramps 03/23/2015  . Dysmenorrhea  04/08/2013  . Endometriosis 04/08/2013   Past Medical History:  Diagnosis Date  . Allergy   . Anemia    takes iron supplement  . Anxiety   . Asthma   . Asthma    prn inhaler  . Blood transfusion 2007  . Blood transfusion 2010  . Blood transfusion without reported diagnosis   . 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 Hemorrhage in uterus 01-20-09  . Hypertension   . Migraines   . 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 Pre-diabetes   . Seasonal allergies   . Sickle cell anemia (HCC)    trait  . Sickle cell trait (HCC)     Family History  Problem Relation Age of Onset  . Fibromyalgia Mother   . Multiple sclerosis Mother   . Asthma Father   . Healthy Maternal Grandmother   . Healthy Maternal Grandfather   . Healthy Paternal Grandmother   . Healthy Paternal Grandfather   . Endometriosis Sister   . Healthy Brother   . Healthy Child     Past Surgical History:  Procedure Laterality Date  . CESAREAN SECTION  09-28-05  . CESAREAN SECTION  01-20-09  . CESAREAN SECTION    . ENDOMETRIAL ABLATION  10/2011   HEROPTIOIN  . KNEE ARTHROSCOPY Right 02/16/2014   Procedure: RIGHT ARTHROSCOPY KNEE;  Surgeon: Kerin Salen, MD;  Location: [facility];  Service: Orthopedics;  Laterality: Right;  . POLYP REMOVAL  5-08   ?CERVICAL OR UTERINE POLYP  . ROBOTIC ASSISTED LAPAROSCOPIC HYSTERECTOMY AND SALPINGECTOMY Bilateral 12/01/2019   Procedure: XI ROBOTIC ASSISTEDTOTAL TOTAL LAPAROSCOPIC HYSTERECTOMY AND RIGHT SALPINGECTOMY , EXTENSIVE LYSIS OF ADHESIONS;  Surgeon: [name], MD;  Location: [facility];  Service: Gynecology;  Laterality: Bilateral;  request 8:30am OR time. Requests 2 hours  . TUBAL LIGATION  01-20-09   DONE WITH C-SECTION  . WISDOM TOOTH EXTRACTION Right 2014   Social History   Occupational History  . Occupation: NT/Transporter  Tobacco Use  . Smoking status: Never Smoker  . Smokeless tobacco: Never Used  Vaping Use  . Vaping Use:  Never used  Substance and Sexual Activity  . Alcohol use: Yes    Alcohol/week: 0.0 standard drinks    Comment: seldom  . Drug use: No  . Sexual activity: Yes    Birth control/protection: Surgical, None    Comment: TUBAL LIGATION, INTERCOURSE AGE 72, SEXUAL PARTNERS 5

## 2020-06-28 NOTE — Progress Notes (Signed)
I saw and examined the patient with Dr. Elouise Munroe and agree with assessment and plan as outlined.    Recurrent LBP with left sciatica.  Will give prednisone.  Refer for ESI.

## 2020-07-06 ENCOUNTER — Encounter: Payer: Self-pay | Admitting: Family Medicine

## 2020-07-10 ENCOUNTER — Other Ambulatory Visit: Payer: Self-pay | Admitting: Family Medicine

## 2020-07-10 ENCOUNTER — Encounter: Payer: Self-pay | Admitting: Family Medicine

## 2020-07-10 MED ORDER — PREDNISONE 10 MG PO TABS: ORAL_TABLET | ORAL | 0 refills | Status: DC

## 2020-07-10 MED FILL — CYCLOBENZAPRINE HCL 10 MG T: 10 | 30 days supply | Qty: 90 | Fill #1

## 2020-07-10 MED FILL — SERTRALINE HCL 50 MG TABLET: 50 | 30 days supply | Qty: 30 | Fill #4

## 2020-07-10 MED FILL — predniSONE 10 MG TABS: 10 | 12 days supply | Qty: 42 | Fill #0

## 2020-07-17 ENCOUNTER — Telehealth: Payer: Self-pay

## 2020-07-17 NOTE — Telephone Encounter (Signed)
Pt returned the call from Dr Ernestina Patches and is ready to be scheduled.

## 2020-07-17 NOTE — Telephone Encounter (Signed)
Called pt and sch OV lower back for insurance.

## 2020-07-20 MED FILL — PROPRANOLOL HCL ER 80 MG CP: 80 | 30 days supply | Qty: 30 | Fill #0

## 2020-07-25 ENCOUNTER — Other Ambulatory Visit: Payer: Self-pay

## 2020-07-25 ENCOUNTER — Telehealth: Payer: Self-pay | Admitting: Physical Medicine and Rehabilitation

## 2020-07-25 ENCOUNTER — Ambulatory Visit: Payer: 59 | Admitting: Physical Medicine and Rehabilitation

## 2020-07-25 VITALS — BP 114/75 | HR 88

## 2020-07-25 DIAGNOSIS — M48061 Spinal stenosis, lumbar region without neurogenic claudication: Secondary | ICD-10-CM | POA: Diagnosis not present

## 2020-07-25 DIAGNOSIS — M5136 Other intervertebral disc degeneration, lumbar region: Secondary | ICD-10-CM

## 2020-07-25 DIAGNOSIS — M5442 Lumbago with sciatica, left side: Secondary | ICD-10-CM

## 2020-07-25 DIAGNOSIS — G8929 Other chronic pain: Secondary | ICD-10-CM

## 2020-07-25 DIAGNOSIS — M5416 Radiculopathy, lumbar region: Secondary | ICD-10-CM | POA: Diagnosis not present

## 2020-07-25 DIAGNOSIS — M5116 Intervertebral disc disorders with radiculopathy, lumbar region: Secondary | ICD-10-CM

## 2020-07-25 NOTE — Progress Notes (Signed)
  Numeric Pain Rating Scale and Functional Assessment Average Pain 7 Pain Right Now 8 My pain is constant, sharp, dull and aching Pain is worse with: walking, sitting and lying down Pain improves with: heat/ice   In the last MONTH (on 0-10 scale) has pain interfered with the following?  1. General activity like being  able to carry out your everyday physical activities such as walking, climbing stairs, carrying groceries, or moving a chair?  Rating(10)  2. Relation with others like being able to carry out your usual social activities and roles such as  activities at home, at work and in your community. Rating(10)  3. Enjoyment of life such that you have  been bothered by emotional problems such as feeling anxious, depressed or irritable?  Rating(7)

## 2020-07-25 NOTE — Telephone Encounter (Signed)
Needs auth resubmitted for left L5-S1 IL when today's note has been dictated. Patient has not been scheduled. Can be scheduled for 15 minute on a Thursday afternoon.

## 2020-07-25 NOTE — Progress Notes (Signed)
Penny Thomas - 35 y.o. female MRN [id number]  Date of birth: 1985-08-10  Office Visit Note: Visit Date: 07/25/2020 PCP: Girtha Rm, NP-C Referred by: Girtha Rm, NP-C  Subjective: Chief Complaint  Patient presents with  . Lower Back - Pain  . Left Leg - Pain   HPI: Penny Thomas is a 35 y.o. female who comes in today For evaluation management at the request of Dr. Eunice Blase of chronic worsening severe low back pain with now left hip and thigh pain.  Her history in our office is such that we saw her in January 2021 a completed right L5-S1 interlaminar epidural steroid injection for treatment of chronic severe low back pain and bilateral hip pain with more right-sided hip and leg pain at the time.  MRI at the time showed disc protrusion at L5-S1 with annular tear and approximation in the lateral recesses of both S1 nerve roots.  This did fit with her symptoms and at the time she had had chiropractic care and physical therapy and medication management really without relief.  She reports the injection at that time gave her great relief for many months.  She reports over the last 8 weeks worsening back pain to the point of about 6 weeks ago she had severe left hip and lateral thigh pain down to the knee.  The pain was so bad that she was crying at times.  This is despite Dr.Hilts use of medications to help her that did take the edge off.  She has continued to do core strengthening through that time and her job she is on her feet quite a bit.  She has had no specific trauma no focal weakness no red flag symptoms.  She reports a sharp 8 out of 10 pain in the left lateral hip and leg in an L5-S1 distribution.  She does get some paresthesias.  She reports starting home exercises and stretching that she had learned before.  She is currently taking tramadol, Naprosyn and Flexeril without much relief.  She has a history of intractable migraine headaches.  Her back and hip pain  is worse with walking and sitting and turning.  It is a constant sharp aching toothache pain.  No prior history of lumbar surgery.  Review of Systems  Musculoskeletal: Positive for back pain and joint pain.       Left hip and leg radicular pain  Neurological: Positive for tingling.  All other systems reviewed and are negative.  Otherwise per HPI.  Assessment & Plan: Visit Diagnoses:    ICD-10-CM   1. Lumbar radiculopathy  M54.16   2. Radiculopathy due to lumbar intervertebral disc disorder  M51.16   3. Bilateral stenosis of lateral recess of lumbar spine  M48.061   4. Annular tear of lumbar disc  M51.36   5. Chronic bilateral low back pain with left-sided sciatica  M54.42    G89.29      Plan: Findings:  Several months of exacerbation of low back pain now with almost 8 weeks of left hip and thigh pain more of an L5 and S1 distribution.  Exam consistent with findings of L5 and S1 radiculopathy with dysesthesias in that dermatome.  She has a positive slump test on the left.  She has had no red flag complaints but she has failed to get better with medication management time and home exercises.  Past history of physical therapy.  1 year ago completed epidural injection with really good relief of  symptoms more right than left at that time but bilateral.  MRI findings of disc herniation at L5-S1 with lateral recess narrowing likely impacting the S1 nerve roots without frank compression.  At this point with her failure of conservative care I think it would be wise to complete left L5-S1 interlaminar epidural steroid injection diagnostically and hopefully therapeutically.  This will be done with fluoroscopic guidance. They have had physical therapy and continue with home exercises.  Current medication management is not beneficial in increasing their functional status.  Procedures are done as part of a comprehensive orthopedic and pain management program with access to in-house orthopedics, spine surgery  and physical therapy as well as access to [facility] counseling if needed.     Meds & Orders: No orders of the defined types were placed in this encounter.  No orders of the defined types were placed in this encounter.   Follow-up: Return for Left L5-S1 interlaminar dural steroid injection.   Procedures: No procedures performed      Clinical History: MRI LUMBAR SPINE WITHOUT CONTRAST  TECHNIQUE: Multiplanar, multisequence MR imaging of the lumbar spine was performed. No intravenous contrast was administered.  COMPARISON:  Prior radiograph from 12/21/2018.  FINDINGS: Segmentation: Standard. Lowest well-formed disc space labeled the L5-S1 level.  Alignment: Physiologic with preservation of the normal lumbar lordosis. No listhesis.  Vertebrae: Vertebral body height maintained without evidence for acute or chronic fracture. Bone marrow signal intensity within normal limits. No discrete or worrisome osseous lesions. No abnormal marrow edema.  Conus medullaris and cauda equina: Conus extends to the L1 level. Conus medullaris within normal limits. Incidental note made of a small fatty filum terminale. Nerve roots of the cauda equina otherwise unremarkable.  Paraspinal and other soft tissues: Paraspinous soft tissues within normal limits. Visualized visceral structures are normal.  Disc levels:  L1-2:  Unremarkable.  L2-3:  Unremarkable.  L3-4: Normal interspace. Mild bilateral facet hypertrophy. No stenosis or impingement.  L4-5: Normal interspace. Mild bilateral facet hypertrophy. No stenosis or impingement.  L5-S1: Disc desiccation. Superimposed shallow central disc protrusion with associated annular fissure. Protruding disc closely approximates both of the descending S1 nerve roots as they course through the lateral recesses without frank impingement or displacement. No significant spinal stenosis. Foramina  remain patent.  IMPRESSION: 1. Shallow central disc protrusion at L5-S1, closely approximating and potentially irritating either of the descending S1 nerve roots. No frank impingement. 2. Otherwise essentially normal MRI of the lumbar spine.   Electronically Signed   By: Jeannine Boga M.D.   On: 05/24/2019 23:19   She reports that she has never smoked. She has never used smokeless tobacco.  Recent Labs    08/10/19 1649  HGBA1C 5.3    Objective:  VS:  HT:    WT:   BMI:     BP:114/75  HR:88bpm  TEMP: ( )  RESP:  Physical Exam Vitals and nursing note reviewed.  Constitutional:      General: She is not in acute distress.    Appearance: Normal appearance. She is not ill-appearing.  HENT:     Head: Normocephalic and atraumatic.     Right Ear: External ear normal.     Left Ear: External ear normal.  Eyes:     Extraocular Movements: Extraocular movements intact.  Cardiovascular:     Rate and Rhythm: Normal rate.     Pulses: Normal pulses.  Pulmonary:     Effort: Pulmonary effort is normal. No respiratory distress.  Abdominal:     General: There is no distension.     Palpations: Abdomen is soft.  Musculoskeletal:        General: Tenderness present.     Cervical back: Neck supple.     Right lower leg: No edema.     Left lower leg: No edema.     Comments: Patient has good distal strength with no pain over the greater trochanters.  No clonus or focal weakness.  She does have a positive left slump test.  She has some dysesthesia in the S1 dermatome on the left.  Skin:    Findings: No erythema, lesion or rash.  Neurological:     General: No focal deficit present.     Mental Status: She is alert and oriented to person, place, and time.     Sensory: No sensory deficit.     Motor: No weakness or abnormal muscle tone.     Coordination: Coordination normal.  Psychiatric:        Mood and Affect: Mood normal.        Behavior: Behavior normal.     Ortho  Exam  Imaging: No results found.  Past Medical/Family/Surgical/Social History: Medications & Allergies reviewed per EMR, new medications updated. Patient Active Problem List   Diagnosis Date Noted  . History of COVID-19 05/31/2020  . Acute non-recurrent frontal sinusitis 05/31/2020  . Cough 05/31/2020  . COVID-19 05/24/2020  . Postoperative state 12/01/2019  . 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  . 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  . Panic attacks 06/16/2019  . Situational anxiety 06/16/2019  . Great toe pain, left 06/16/2019  . History of gestational diabetes 12/02/2018  . Prediabetes 12/02/2018  . Mild persistent asthma without complication 77/41/2878  . Acute nasopharyngitis 12/11/2016  . Intractable chronic migraine without aura and without status migrainosus 06/23/2015  . Migraines 03/23/2015  . Right-sided low back pain without sciatica 03/23/2015  . Muscle cramps 03/23/2015  . Dysmenorrhea 04/08/2013  . Endometriosis 04/08/2013   Past Medical History:  Diagnosis Date  . Allergy   . Anemia    takes iron supplement  . Anxiety   . Asthma   . Asthma    prn inhaler  . Blood transfusion 2007  . Blood transfusion 2010  . Blood transfusion without reported diagnosis   . 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 Hemorrhage in uterus 01-20-09  . Hypertension   . Migraines   . 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 Pre-diabetes   . Seasonal allergies   . Sickle cell anemia (HCC)    trait  . Sickle cell trait (HCC)    Family History  Problem Relation Age of Onset  . Fibromyalgia Mother   . Multiple sclerosis Mother   . Asthma Father   . Healthy Maternal Grandmother   . Healthy Maternal Grandfather   . Healthy Paternal Grandmother   . Healthy Paternal Grandfather   . Endometriosis Sister   . Healthy Brother   . Healthy Child    Past Surgical History:  Procedure Laterality Date  . CESAREAN SECTION  09-28-05  . CESAREAN SECTION  01-20-09  . CESAREAN SECTION    . ENDOMETRIAL  ABLATION  10/2011   HEROPTIOIN  . KNEE ARTHROSCOPY Right 02/16/2014   Procedure: RIGHT ARTHROSCOPY KNEE;  Surgeon: Kerin Salen, MD;  Location: [facility];  Service: Orthopedics;  Laterality: Right;  . POLYP REMOVAL  5-08   ?CERVICAL OR UTERINE POLYP  . ROBOTIC ASSISTED LAPAROSCOPIC HYSTERECTOMY AND  SALPINGECTOMY Bilateral 12/01/2019   Procedure: XI ROBOTIC ASSISTEDTOTAL TOTAL LAPAROSCOPIC HYSTERECTOMY AND RIGHT SALPINGECTOMY , EXTENSIVE LYSIS OF ADHESIONS;  Surgeon: [name], MD;  Location: [facility];  Service: Gynecology;  Laterality: Bilateral;  request 8:30am OR time. Requests 2 hours  . TUBAL LIGATION  01-20-09   DONE WITH C-SECTION  . WISDOM TOOTH EXTRACTION Right 2014   Social History   Occupational History  . Occupation: NT/Transporter  Tobacco Use  . Smoking status: Never Smoker  . Smokeless tobacco: Never Used  Vaping Use  . Vaping Use: Never used  Substance and Sexual Activity  . Alcohol use: Yes    Alcohol/week: 0.0 standard drinks    Comment: seldom  . Drug use: No  . Sexual activity: Yes    Birth control/protection: Surgical, None    Comment: TUBAL LIGATION, INTERCOURSE AGE 37, SEXUAL PARTNERS 5

## 2020-07-26 ENCOUNTER — Encounter: Payer: Self-pay | Admitting: Physical Medicine and Rehabilitation

## 2020-07-26 NOTE — Telephone Encounter (Signed)
Pt insurance is PENDING. 

## 2020-07-27 NOTE — Telephone Encounter (Signed)
Called pt and LVM #1 

## 2020-07-27 NOTE — Telephone Encounter (Signed)
Pt has been approve Auth# [id number]

## 2020-07-28 NOTE — Telephone Encounter (Signed)
Called pt and sch 3/21

## 2020-08-03 MED FILL — SERTRALINE HCL 50 MG TABS: 50 | 30 days supply | Qty: 30 | Fill #5

## 2020-08-08 ENCOUNTER — Other Ambulatory Visit: Payer: Self-pay | Admitting: Family Medicine

## 2020-08-08 MED ORDER — TRAMADOL HCL 50 MG PO TABS: 50.0000 mg | ORAL_TABLET | Freq: Four times a day (QID) | ORAL | 0 refills | Status: DC | PRN

## 2020-08-08 NOTE — Addendum Note (Signed)
Addended by: Hortencia Pilar on: 08/08/2020 08:47 AM   Modules accepted: Orders

## 2020-08-14 ENCOUNTER — Ambulatory Visit (INDEPENDENT_AMBULATORY_CARE_PROVIDER_SITE_OTHER): Payer: 59 | Admitting: Physical Medicine and Rehabilitation

## 2020-08-14 ENCOUNTER — Ambulatory Visit: Payer: Self-pay

## 2020-08-14 ENCOUNTER — Other Ambulatory Visit: Payer: Self-pay

## 2020-08-14 ENCOUNTER — Encounter: Payer: Self-pay | Admitting: Physical Medicine and Rehabilitation

## 2020-08-14 VITALS — BP 116/76 | HR 87

## 2020-08-14 DIAGNOSIS — M5416 Radiculopathy, lumbar region: Secondary | ICD-10-CM

## 2020-08-14 MED ORDER — BETAMETHASONE SOD PHOS & ACET 6 (3-3) MG/ML IJ SUSP
12.0000 mg | Freq: Once | INTRAMUSCULAR | Status: AC
  Administered 2020-08-14: 12 mg

## 2020-08-14 NOTE — Progress Notes (Signed)
Pt state middle and lower back pain,mostly on her left side. Pt state walking, standing and sitting makes the pain worse. Pt state she take pain meds to help ease her pain.  Numeric Pain Rating Scale and Functional Assessment Average Pain 5   In the last MONTH (on 0-10 scale) has pain interfered with the following?  1. General activity like being  able to carry out your everyday physical activities such as walking, climbing stairs, carrying groceries, or moving a chair?  Rating(10)   +Driver, -BT, -Dye Allergies.

## 2020-08-14 NOTE — Patient Instructions (Signed)

## 2020-08-14 NOTE — Progress Notes (Signed)
Penny Thomas - 35 y.o. female MRN [id number]  Date of birth: 16-Sep-1985  Office Visit Note: Visit Date: 08/14/2020 PCP: Girtha Rm, NP-C Referred by: Girtha Rm, NP-C  Subjective: Chief Complaint  Patient presents with  . Lower Back - Pain  . Middle Back - Pain   HPI:  Penny Thomas is a 35 y.o. female who comes in today at the request of Dr. Laurence Spates for planned Left L5-S1 Lumbar epidural steroid injection with fluoroscopic guidance.  The patient has failed conservative care including home exercise, medications, time and activity modification.  This injection will be diagnostic and hopefully therapeutic.  Please see requesting physician notes for further details and justification.   ROS Otherwise per HPI.  Assessment & Plan: Visit Diagnoses:    ICD-10-CM   1. Lumbar radiculopathy  M54.16 XR C-ARM NO REPORT    Epidural Steroid injection    betamethasone acetate-betamethasone sodium phosphate (CELESTONE) injection 12 mg    Plan: No additional findings.   Meds & Orders:  Meds ordered this encounter  Medications  . betamethasone acetate-betamethasone sodium phosphate (CELESTONE) injection 12 mg    Orders Placed This Encounter  Procedures  . XR C-ARM NO REPORT  . Epidural Steroid injection    Follow-up: Return if symptoms worsen or fail to improve.   Procedures: No procedures performed  Lumbar Epidural Steroid Injection - Interlaminar Approach with Fluoroscopic Guidance  Patient: Penny Thomas      Date of Birth: 1985-08-14 MRN: [id number] PCP: Girtha Rm, NP-C      Visit Date: 08/14/2020   Universal Protocol:     Consent Given By: the patient  Position: PRONE  Additional Comments: Vital signs were monitored before and after the procedure. Patient was prepped and draped in the usual sterile fashion. The correct patient, procedure, and site was verified.   Injection Procedure Details:   Procedure diagnoses:  Lumbar radiculopathy [M54.16]   Meds Administered:  Meds ordered this encounter  Medications  . betamethasone acetate-betamethasone sodium phosphate (CELESTONE) injection 12 mg     Laterality: Left  Location/Site:  L5-S1  Needle: 3.5 in., 20 ga. Tuohy  Needle Placement: Paramedian epidural  Findings:   -Comments: Excellent flow of contrast into the epidural space.  Procedure Details: Using a paramedian approach from the side mentioned above, the region overlying the inferior lamina was localized under fluoroscopic visualization and the soft tissues overlying this structure were infiltrated with 4 ml. of 1% Lidocaine without Epinephrine. The Tuohy needle was inserted into the epidural space using a paramedian approach.   The epidural space was localized using loss of resistance along with counter oblique bi-planar fluoroscopic views.  After negative aspirate for air, blood, and CSF, a 2 ml. volume of Isovue-250 was injected into the epidural space and the flow of contrast was observed. Radiographs were obtained for documentation purposes.    The injectate was administered into the level noted above.   Additional Comments:  The patient tolerated the procedure well Dressing: 2 x 2 sterile gauze and Band-Aid    Post-procedure details: Patient was observed during the procedure. Post-procedure instructions were reviewed.  Patient left the clinic in stable condition.     Clinical History: MRI LUMBAR SPINE WITHOUT CONTRAST  TECHNIQUE: Multiplanar, multisequence MR imaging of the lumbar spine was performed. No intravenous contrast was administered.  COMPARISON:  Prior radiograph from 12/21/2018.  FINDINGS: Segmentation: Standard. Lowest well-formed disc space labeled the L5-S1 level.  Alignment: Physiologic with preservation of  the normal lumbar lordosis. No listhesis.  Vertebrae: Vertebral body height maintained without evidence for acute or chronic fracture.  Bone marrow signal intensity within normal limits. No discrete or worrisome osseous lesions. No abnormal marrow edema.  Conus medullaris and cauda equina: Conus extends to the L1 level. Conus medullaris within normal limits. Incidental note made of a small fatty filum terminale. Nerve roots of the cauda equina otherwise unremarkable.  Paraspinal and other soft tissues: Paraspinous soft tissues within normal limits. Visualized visceral structures are normal.  Disc levels:  L1-2:  Unremarkable.  L2-3:  Unremarkable.  L3-4: Normal interspace. Mild bilateral facet hypertrophy. No stenosis or impingement.  L4-5: Normal interspace. Mild bilateral facet hypertrophy. No stenosis or impingement.  L5-S1: Disc desiccation. Superimposed shallow central disc protrusion with associated annular fissure. Protruding disc closely approximates both of the descending S1 nerve roots as they course through the lateral recesses without frank impingement or displacement. No significant spinal stenosis. Foramina remain patent.  IMPRESSION: 1. Shallow central disc protrusion at L5-S1, closely approximating and potentially irritating either of the descending S1 nerve roots. No frank impingement. 2. Otherwise essentially normal MRI of the lumbar spine.   Electronically Signed   By: Jeannine Boga M.D.   On: 05/24/2019 23:19     Objective:  VS:  HT:    WT:   BMI:     BP:116/76  HR:87bpm  TEMP: ( )  RESP:  Physical Exam Vitals and nursing note reviewed.  Constitutional:      General: She is not in acute distress.    Appearance: Normal appearance. She is obese. She is not ill-appearing.  HENT:     Head: Normocephalic and atraumatic.     Right Ear: External ear normal.     Left Ear: External ear normal.  Eyes:     Extraocular Movements: Extraocular movements intact.  Cardiovascular:     Rate and Rhythm: Normal rate.     Pulses: Normal pulses.  Pulmonary:     Effort:  Pulmonary effort is normal. No respiratory distress.  Abdominal:     General: There is no distension.     Palpations: Abdomen is soft.  Musculoskeletal:        General: Tenderness present.     Cervical back: Neck supple.     Right lower leg: No edema.     Left lower leg: No edema.     Comments: Patient has good distal strength with no pain over the greater trochanters.  No clonus or focal weakness.  Skin:    Findings: No erythema, lesion or rash.  Neurological:     General: No focal deficit present.     Mental Status: She is alert and oriented to person, place, and time.     Sensory: No sensory deficit.     Motor: No weakness or abnormal muscle tone.     Coordination: Coordination normal.  Psychiatric:        Mood and Affect: Mood normal.        Behavior: Behavior normal.      Imaging: No results found.

## 2020-08-22 ENCOUNTER — Other Ambulatory Visit (HOSPITAL_COMMUNITY): Payer: Self-pay

## 2020-08-22 DIAGNOSIS — J018 Other acute sinusitis: Secondary | ICD-10-CM | POA: Diagnosis not present

## 2020-08-22 MED FILL — DOXYCYCLINE HYCLATE 100 MG: 100 | 7 days supply | Qty: 14 | Fill #0

## 2020-08-31 ENCOUNTER — Other Ambulatory Visit (HOSPITAL_COMMUNITY): Payer: Self-pay

## 2020-08-31 NOTE — Procedures (Signed)
Lumbar Epidural Steroid Injection - Interlaminar Approach with Fluoroscopic Guidance  Patient: Penny Thomas      Date of Birth: February 16, 1986 MRN: [id number] PCP: Girtha Rm, NP-C      Visit Date: 08/14/2020   Universal Protocol:     Consent Given By: the patient  Position: PRONE  Additional Comments: Vital signs were monitored before and after the procedure. Patient was prepped and draped in the usual sterile fashion. The correct patient, procedure, and site was verified.   Injection Procedure Details:   Procedure diagnoses: Lumbar radiculopathy [M54.16]   Meds Administered:  Meds ordered this encounter  Medications  . betamethasone acetate-betamethasone sodium phosphate (CELESTONE) injection 12 mg     Laterality: Left  Location/Site:  L5-S1  Needle: 3.5 in., 20 ga. Tuohy  Needle Placement: Paramedian epidural  Findings:   -Comments: Excellent flow of contrast into the epidural space.  Procedure Details: Using a paramedian approach from the side mentioned above, the region overlying the inferior lamina was localized under fluoroscopic visualization and the soft tissues overlying this structure were infiltrated with 4 ml. of 1% Lidocaine without Epinephrine. The Tuohy needle was inserted into the epidural space using a paramedian approach.   The epidural space was localized using loss of resistance along with counter oblique bi-planar fluoroscopic views.  After negative aspirate for air, blood, and CSF, a 2 ml. volume of Isovue-250 was injected into the epidural space and the flow of contrast was observed. Radiographs were obtained for documentation purposes.    The injectate was administered into the level noted above.   Additional Comments:  The patient tolerated the procedure well Dressing: 2 x 2 sterile gauze and Band-Aid    Post-procedure details: Patient was observed during the procedure. Post-procedure instructions were reviewed.  Patient  left the clinic in stable condition.

## 2020-09-13 ENCOUNTER — Other Ambulatory Visit: Payer: Self-pay

## 2020-09-13 ENCOUNTER — Encounter: Payer: Self-pay | Admitting: Family Medicine

## 2020-09-13 ENCOUNTER — Telehealth: Payer: Managed Care, Other (non HMO) | Admitting: Family Medicine

## 2020-09-13 ENCOUNTER — Other Ambulatory Visit (HOSPITAL_COMMUNITY): Payer: Self-pay

## 2020-09-13 DIAGNOSIS — J3089 Other allergic rhinitis: Secondary | ICD-10-CM

## 2020-09-13 DIAGNOSIS — J0110 Acute frontal sinusitis, unspecified: Secondary | ICD-10-CM | POA: Diagnosis not present

## 2020-09-13 DIAGNOSIS — J029 Acute pharyngitis, unspecified: Secondary | ICD-10-CM

## 2020-09-13 DIAGNOSIS — J4530 Mild persistent asthma, uncomplicated: Secondary | ICD-10-CM | POA: Diagnosis not present

## 2020-09-13 MED ORDER — AMOXICILLIN-POT CLAVULANATE 875-125 MG PO TABS
1.0000 | ORAL_TABLET | Freq: Two times a day (BID) | ORAL | 0 refills | Status: DC
  Filled 2020-09-13: qty 20, 10d supply, fill #0

## 2020-09-13 NOTE — Progress Notes (Signed)
   Subjective:  Documentation for virtual audio and video telecommunications through [facility] encounter:  The patient was located at home. 2 patient identifiers used.  The provider was located in the office. The patient did consent to this visit and is aware of possible charges through their insurance for this visit.  The other persons participating in this telemedicine service were none. Time spent on call was 16 minutes and in review of previous records 20 minutes total.  This virtual service is not related to other E/M service within previous 7 days.   Patient ID: Penny Thomas, female    DOB: 04-06-1986, 35 y.o.   MRN: [id number]  HPI Chief Complaint  Patient presents with  . sick    Sick going on 2 weeks. Loss of appetite, sore throat, can't swallow, coughing.    Complains of being diagnosed with a sinus infection via an E-Visit with someone from her insurance company in late March.  She completed a course of Doxycycline for this last week.  States she felt slightly improved but continues having URI symptoms including frontal headache, nasal congestion, nasal drainage that is clear/light green (better) and sore throat with post nasal drainage and nausea. Also reports fatigue.  Some cough at night.   Taking Allegra D, Robitussin with honey.   Asthma is fine. No flare ups.   No fever, chills, dizziness, chest pain, palpitations, shortness of breath, abdominal pain, vomiting or diarrhea.  Hysterectomy.   Reviewed allergies, medications, past medical, surgical, family, and social history.   Review of Systems Pertinent positives and negatives in the history of present illness.     Objective:   Physical Exam LMP 11/29/2015   Alert and oriented in no acute distress.  Respirations unlabored.  She is speaking in complete senses without difficulty.  She is rubbing her nose throughout the visit.      Assessment & Plan:  Acute non-recurrent frontal sinusitis - Plan:  amoxicillin-clavulanate (AUGMENTIN) 875-125 MG tablet  Mild persistent asthma without complication  Acute pharyngitis, unspecified etiology  Environmental and seasonal allergies  Symptoms ongoing for approximately 1 month with doxycycline improving symptoms slightly.  I will prescribe Augmentin.  In-depth discussion on symptomatic treatment for sinusitis, headache and sore throat.  Asthma is well controlled.  She will try over-the-counter Xyzal since Zyrtec was no longer working nor is Human resources officer D.  Encouraged her to follow-up if worsening or not back to baseline when she completes the antibiotic.

## 2020-09-20 ENCOUNTER — Other Ambulatory Visit: Payer: Self-pay | Admitting: Family Medicine

## 2020-09-20 DIAGNOSIS — F418 Other specified anxiety disorders: Secondary | ICD-10-CM

## 2020-09-20 DIAGNOSIS — F410 Panic disorder [episodic paroxysmal anxiety] without agoraphobia: Secondary | ICD-10-CM

## 2020-09-20 NOTE — Telephone Encounter (Signed)
Ok to give her 3 months of refills and schedule her for a CPE or med check at least.

## 2020-09-20 NOTE — Telephone Encounter (Signed)
Is this okay to refill? 

## 2020-09-21 ENCOUNTER — Other Ambulatory Visit (HOSPITAL_COMMUNITY): Payer: Self-pay

## 2020-09-21 MED ORDER — SERTRALINE HCL 50 MG PO TABS
ORAL_TABLET | Freq: Every day | ORAL | 1 refills | Status: DC
  Filled 2020-09-21: qty 30, 30d supply, fill #0
  Filled 2020-10-30: qty 30, 30d supply, fill #1

## 2020-09-22 ENCOUNTER — Other Ambulatory Visit (HOSPITAL_COMMUNITY): Payer: Self-pay

## 2020-09-22 MED FILL — Propranolol HCl Cap ER 24HR 80 MG: ORAL | 30 days supply | Qty: 30 | Fill #0 | Status: AC

## 2020-09-23 ENCOUNTER — Other Ambulatory Visit (HOSPITAL_COMMUNITY): Payer: Self-pay

## 2020-09-26 ENCOUNTER — Encounter: Payer: Self-pay | Admitting: Family Medicine

## 2020-10-10 ENCOUNTER — Encounter: Payer: Self-pay | Admitting: Allergy & Immunology

## 2020-10-10 ENCOUNTER — Other Ambulatory Visit: Payer: Self-pay

## 2020-10-10 ENCOUNTER — Other Ambulatory Visit (HOSPITAL_COMMUNITY): Payer: Self-pay

## 2020-10-10 ENCOUNTER — Ambulatory Visit: Payer: Managed Care, Other (non HMO) | Admitting: Allergy & Immunology

## 2020-10-10 VITALS — BP 118/72 | HR 66 | Temp 97.6°F | Resp 16 | Ht 67.0 in | Wt 185.2 lb

## 2020-10-10 DIAGNOSIS — L299 Pruritus, unspecified: Secondary | ICD-10-CM

## 2020-10-10 DIAGNOSIS — J3089 Other allergic rhinitis: Secondary | ICD-10-CM | POA: Diagnosis not present

## 2020-10-10 DIAGNOSIS — J4530 Mild persistent asthma, uncomplicated: Secondary | ICD-10-CM | POA: Diagnosis not present

## 2020-10-10 DIAGNOSIS — T7800XD Anaphylactic reaction due to unspecified food, subsequent encounter: Secondary | ICD-10-CM

## 2020-10-10 MED ORDER — FAMOTIDINE 20 MG PO TABS
20.0000 mg | ORAL_TABLET | Freq: Two times a day (BID) | ORAL | 5 refills | Status: DC
  Filled 2020-10-10: qty 60, 30d supply, fill #0
  Filled 2020-11-01: qty 60, 30d supply, fill #1

## 2020-10-10 MED ORDER — ALBUTEROL SULFATE HFA 108 (90 BASE) MCG/ACT IN AERS
INHALATION_SPRAY | RESPIRATORY_TRACT | 1 refills | Status: DC
  Filled 2020-10-10: qty 8.5, 16d supply, fill #0
  Filled 2020-11-01: qty 8.5, 16d supply, fill #1

## 2020-10-10 MED ORDER — CETIRIZINE HCL 10 MG PO TABS
10.0000 mg | ORAL_TABLET | Freq: Two times a day (BID) | ORAL | 5 refills | Status: DC
  Filled 2020-10-10: qty 60, 30d supply, fill #0

## 2020-10-10 MED ORDER — SPACER/AERO-HOLDING CHAMBERS DEVI
1.0000 | Freq: Once | 0 refills | Status: AC
  Filled 2020-10-10: qty 1, 30d supply, fill #0
  Filled 2020-10-10: qty 1, fill #0

## 2020-10-10 NOTE — Patient Instructions (Addendum)
1. Chronic rhinitis - Testing today showed: dust mites and cockroach - Copy of test results provided. - Avoidance measures provided. - Start taking: Zyrtec (cetirizine) 10mg  to 20mg  TWICE daily  - You can use an extra dose of the antihistamine, if needed, for breakthrough symptoms.  - Consider nasal saline rinses 1-2 times daily to remove allergens from the nasal cavities as well as help with mucous clearance (this is especially helpful to do before the nasal sprays are given) - Consider allergy shots as a means of long-term control. - Allergy shots "re-train" and "reset" the immune system to ignore environmental allergens and decrease the resulting immune response to those allergens (sneezing, itchy watery eyes, runny nose, nasal congestion, etc).    - Allergy shots improve symptoms in 75-85% of patients.  - We can discuss more at the next appointment if the medications are not working for you.  2. Mild persistent asthma, uncomplicated - Lung testing looked low, but it normalized after the albuterol treatment. - We are going to start you on Alvesco 160 mcg 1 puff twice daily. - You should be able to receive this for free through a mail order pharmacy. - This will take the place of the Qvar. - Spacer use reviewed. - Daily controller medication(s): Alvesco 160 mcg 1 puff twice daily with spacer - Prior to physical activity: albuterol 2 puffs 10-15 minutes before physical activity. - Rescue medications: albuterol 4 puffs every 4-6 hours as needed - Changes during respiratory infections or worsening symptoms: Increase Alvesco 176mcg to 2 puffs twice daily for TWO WEEKS. - Asthma control goals:  * Full participation in all desired activities (may need albuterol before activity) * Albuterol use two time or less a week on average (not counting use with activity) * Cough interfering with sleep two time or less a month * Oral steroids no more than once a year * No hospitalizations  3.  Pruritus - Your skin testing does not really explain your itching. - Therefore, we are going to get some labs to rule out serious causes of itching (although these are typically accompanied with hives, which you notably lack). - We will get some labs to rule out serious causes of itching: complete blood count, alpha gal panel, tryptase level, chronic urticaria panel, CMP, ESR, and CRP. - We are ordering labs, so please allow 1-2 weeks for the results to come back. - With the newly implemented Cures Act, the labs might be visible to you at the same time that they become visible to me. - However, I will not address the results until all of the results come  back, so please be patient.  - In the meantime, continue avoiding your triggering food(s) in your After Visit Summary, including avoidance measures (if applicable), until you hear from me about the results.   - Chronic hives are often times a self limited process and will "burn themselves out" over 6-12 months, although this is not always the case.  - In the meantime, start suppressive dosing of antihistamines:   - Morning: Zyrtec (cetirizine) 20mg  (two tablets) + Pepcid (famotidine) 20mg   - Evening: Zyrtec (cetirizine) 20mg  (two tablets) + Pepcid (famotidine) 20mg   - If the above is not working, try adding: Pepcid (famotidine) 20mg   4. Return in about 4 weeks (around 11/07/2020).    Please inform us of any Emergency Department visits, hospitalizations, or changes in symptoms. Call us before going to the ED for breathing or allergy symptoms since we might be able  to fit you in for a sick visit. Feel free to contact us anytime with any questions, problems, or concerns.  It was a pleasure to meet you today!  Websites that have reliable patient information: 1. American Academy of Asthma, Allergy, and Immunology: www.aaaai.org 2. Food Allergy Research and Education (FARE): foodallergy.org 3. Mothers of Asthmatics:  http://www.asthmacommunitynetwork.org 4. American College of Allergy, Asthma, and Immunology: www.acaai.org   COVID-19 Vaccine Information can be found at: [email or web address] For questions related to vaccine distribution or appointments, please email [email or web address] or call [phone].   We realize that you might be concerned about having an allergic reaction to the COVID19 vaccines. To help with that concern, WE ARE OFFERING THE COVID19 VACCINES IN OUR OFFICE! Ask the front desk for dates!     "Like" [place] on Facebook and Instagram for our latest updates!      A healthy democracy works best when [employer] participate! Make sure you are registered to vote! If you have moved or changed any of your contact information, you will need to get this updated before voting!  In some cases, you MAY be able to register to vote online: [email or web address]     1. Control-Buffer 50% Glycerol Negative   2. Control-Histamine 1 mg/ml 2+   3. Albumin saline Negative   4. Radar Base Negative   5. [place] Negative   6. Johnson Negative   7. [place] Blue Negative   8. Meadow Fescue Negative   9. Perennial Rye Negative   10. Sweet Vernal Negative   11. Timothy Negative   12. Cocklebur Negative   13. Burweed Marshelder Negative   14. Ragweed, short Negative   15. Ragweed, Giant Negative   16. Plantain,  English Negative   17. Lamb's Quarters Negative   18. Sheep Sorrell Negative   19. Rough Pigweed Negative   20. Marsh Elder, Rough Negative   21. Mugwort, Common Negative   22. Ash mix Negative   23. Birch mix Negative   24. Beech American Negative   25. Box, Elder Negative   26. Cedar, red Negative   27. Cottonwood, [place] Negative   28. Elm mix Negative   29. Hickory Negative   30. Maple mix Negative   31. Oak, [place] mix Negative   32. Pecan Pollen Negative   33. Pine mix Negative   34.  Sycamore Eastern Negative   35. [place], Black Pollen Negative   36. Alternaria alternata Negative   37. Cladosporium Herbarum Negative   38. Aspergillus mix Negative   39. Penicillium mix Negative   40. Bipolaris sorokiniana (Helminthosporium) Negative   41. Drechslera spicifera (Curvularia) Negative   42. Mucor plumbeus Negative   43. Fusarium moniliforme Negative   44. Aureobasidium pullulans (pullulara) Negative   45. Rhizopus oryzae Negative   46. Botrytis cinera Negative   47. Epicoccum nigrum Negative   48. Phoma betae Negative   49. Candida Albicans Negative   50. Trichophyton mentagrophytes Negative   51. Mite, D Farinae  5,000 AU/ml Negative   52. Mite, D Pteronyssinus  5,000 AU/ml 3+   53. Cat Hair 10,000 BAU/ml Negative   54.  Dog Epithelia Negative   55. Mixed Feathers Negative   56. Horse Epithelia Negative   57. Cockroach, German Negative   58. Mouse Negative   59. Tobacco Leaf Negative     Control Negative   [place] Negative   Johnson Negative   7 Grass Negative   Ragweed mix Negative   Weed mix Negative  Tree mix Negative   Mold 1 Negative   Mold 2 Negative   Mold 3 Negative   Mold 4 Negative   Cat Negative   Dog Negative   Cockroach 3+   Mite mix 3+     Number of allergen test 1   63. Pineapple Negative

## 2020-10-10 NOTE — Progress Notes (Signed)
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  Allergy testing revealed only sensitizations to dust mites and cockroach.  She has been sensitized to quite a few other triggers in the past.  Her itching gets worse in the spring which is not explained by her sensitizations today.  Therefore, we are going to get some lab work to rule out serious causes Thomas itching.  Depending on those results, we might do a series Thomas further test.  We are going to put her on suppressive doses Thomas antihistamines.  Thankfully, her symptoms seem to be exquisitely sensitive to antihistamines.  We will follow-up after the labs.  Plan/Recommendations:   1. Chronic rhinitis - Testing today showed: dust mites and cockroach - Copy Thomas test results provided. - Avoidance measures provided. - Start taking: Zyrtec (cetirizine) 67m to 24mTWICE daily  - You can use an extra dose Thomas the antihistamine, if needed, for breakthrough symptoms.  - Consider nasal saline rinses 1-2 times daily to remove allergens from the nasal cavities as well as help with mucous clearance (this is especially helpful to do before the nasal sprays are given) - Consider allergy shots as a means Thomas long-term control. - Allergy shots "re-train" and "reset" the immune system to ignore environmental allergens and decrease the resulting immune response to those allergens (sneezing, itchy watery eyes, runny nose, nasal congestion, etc).    - Allergy shots improve symptoms in 75-85% Thomas patients.  - We can discuss more at the next appointment if the medications are not working for you.  2. Mild persistent asthma,  uncomplicated - Lung testing looked low, but it normalized after the albuterol treatment. - We are going to start you on Alvesco 160 mcg 1 puff twice daily. - You should be able to receive this for free through a mail order pharmacy. - This will take the place Thomas the Qvar. - Spacer use reviewed. - Daily controller medication(s): Alvesco 160 mcg 1 puff twice daily with spacer - Prior to physical activity: albuterol 2 puffs 10-15 minutes before physical activity. - Rescue medications: albuterol 4 puffs every 4-6 hours as needed - Changes during respiratory infections or worsening symptoms: Increase Alvesco 16072mto 2 puffs twice daily for TWO WEEKS. - Asthma control goals:  * Full participation in all desired activities (may need albuterol before activity) * Albuterol use two time or less a week on average (not counting use with activity) * Cough interfering with sleep two time or less a month * Oral steroids no more than once a year * No hospitalizations  3. Pruritus - Your skin testing does not really explain your itching. - Therefore, we are going to get some labs to rule out serious causes Thomas itching (although these are typically accompanied with hives, which you notably lack). - We will get some labs to rule out serious causes Thomas itching: complete blood count, alpha gal panel, tryptase level, chronic urticaria panel, CMP, ESR, and CRP. - We are ordering labs, so please allow 1-2 weeks for the results to come back. - With the newly implemented Cures Act, the labs might be visible to you at the same  time that they become visible to me. - However, I will not address the results until all Thomas the results come  back, so please be patient.  - In the meantime, continue avoiding your triggering food(s) in your After Visit Summary, including avoidance measures (if applicable), until you hear from me about the results.   - Chronic hives are often times a self limited process and will "burn  themselves out" over 6-12 months, although this is not always the case.  - In the meantime, start suppressive dosing Thomas antihistamines:   - Morning: Zyrtec (cetirizine) 66m (two tablets) + Pepcid (famotidine) 250m - Evening: Zyrtec (cetirizine) 2013mtwo tablets) + Pepcid (famotidine) 107m54m If the above is not working, try adding: Pepcid (famotidine) 107mg45m Return in about 4 weeks (around 11/07/2020).   This note in its entirety was forwarded to the Provider who requested this consultation.  Subjective:   Penny Thomas  Chief Complaint  Patient presents with  . Allergic Reaction    Constant Itching all over the body take benadryl frequently - for foods pineapple causes itching takes an hour to resolve with benadryl     Penny Thomas: Patient Active Problem List   Diagnosis Date Noted  . History Thomas COVID-19 05/31/2020  . Acute non-recurrent frontal sinusitis 05/31/2020  . Cough 05/31/2020  . COVID-19 05/24/2020  . Postoperative state 12/01/2019  . 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 Panic attacks 06/16/2019  . Situational anxiety 06/16/2019  . Great toe pain, left 06/16/2019  . History Thomas gestational diabetes 12/02/2018  . Prediabetes 12/02/2018  . Mild persistent asthma without complication 11/844/27/0350cute nasopharyngitis 12/11/2016  . Intractable chronic migraine without aura and without status migrainosus 06/23/2015  . Migraines 03/23/2015  . Right-sided low back pain without sciatica 03/23/2015  . Muscle cramps 03/23/2015  . Dysmenorrhea 04/08/2013  . Endometriosis 04/08/2013    History obtained from: chart review and patient.  Penny Mesireferred by HensoGirtha RmC.     Penny Thomas.  She tells me that she is  "allergic to everything". Symptoms have been going on for a month or so. She reports that she will itch after eating just about anything. She will have itching Thomas her skin and eyes when she steps outside. She did have the itching eyes one year ago but it resolved. It stopped around the winter last year. Symptoms are terrible from February through November, but 2022 has been particularly terrible. She has been using Benadryl two tablets several times per day. She will use some at night as well to prevent itching during the night. This does not knock her out at all.  She has no definitive rash from this at all including eczema or urticaria.  It is only ever itching.  She denies any swelling.  There have been no changes in her life aside from hysterectomy that she had last summer.  However, she was experiencing itching even before that although not to the same extent.  In 2022, she was placed on a course Thomas two antibiotics courses. She does get sinus infections often but she never needs to courses to clear it up. She does not get prednisone at all. She was told to take "allergy  medication" and Mucinex with the antibiotic.   She did see ENT when she was 9, but otherwise not recently. She is deaf 50% in her left ear. They are unsure Thomas the cause but likely an infection Thomas some sort. She has compensated. She does have hearing aids but she never liked wearing them.   Asthma/Respiratory Symptom History: She does have a history Thomas asthma. She has a rescue inhaler as well as Qvar that she takes twice daily. She has not been using it routinely since she needs a refill. She has been on the Qvar for as long as she can remember. She was seen by Dr. Nelva Bush in 2019 last visit, but she has bene lost to follow up.   Allergic Rhinitis Symptom History: She was on allergy shots through [facility]. She did this for two years nad it seemed to help. She has tried changing from Zyrtec to [place]. This did help  somewhat and has helped her to breathe. She reports that she has chronic nasal congestion. She also has some postnasal drip. She was on a nose spray at one point.  Food Allergy Symptom History: She loves pineapple and would eat an entire pineapple without a problem. She now eats a peice and she has itching over her hands, lips, and entire body. She does not really avoid any other foods.   Otherwise, there is no history Thomas other atopic diseases, including drug allergies, stinging insect allergies, eczema, urticaria or contact dermatitis. There is no significant infectious history.  All vaccinations are up to date.    Past Medical History: Patient Active Problem List   Diagnosis Date Noted  . History Thomas COVID-19 05/31/2020  . Acute non-recurrent frontal sinusitis 05/31/2020  . Cough 05/31/2020  . COVID-19 05/24/2020  . Postoperative state 12/01/2019  . 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 Panic attacks 06/16/2019  . Situational anxiety 06/16/2019  . Great toe pain, left 06/16/2019  . History Thomas gestational diabetes 12/02/2018  . Prediabetes 12/02/2018  . Mild persistent asthma without complication 05/69/7948  . Acute nasopharyngitis 12/11/2016  . Intractable chronic migraine without aura and without status migrainosus 06/23/2015  . Migraines 03/23/2015  . Right-sided low back pain without sciatica 03/23/2015  . Muscle cramps 03/23/2015  . Dysmenorrhea 04/08/2013  . Endometriosis 04/08/2013    Medication List:  Allergies as Thomas 10/10/2020      Reactions   [name] [elagolix] Other (See Comments)   Hallucinations      Medication List       Accurate as Thomas Oct 10, 2020 11:44 AM. If you have any questions, ask your nurse or doctor.        albuterol 108 (90 Base) MCG/ACT inhaler Commonly known as: VENTOLIN HFA Inhale 1-2 puffs into the lungs every 6 (six) hours as needed for wheezing or shortness Thomas breath. What changed: Another medication with the same  name was added. Make sure you understand how and when to take each. Changed by: Valentina Shaggy, MD   albuterol 108 (90 Base) MCG/ACT inhaler Commonly known as: VENTOLIN HFA 4 puffs every 4-6 hours as needed What changed: You were already taking a medication with the same name, and this prescription was added. Make sure you understand how and when to take each. Changed by: Valentina Shaggy, MD   Allegra Allergy 180 MG tablet Generic drug: fexofenadine TAKE 1 TABLET BY MOUTH ONCE A DAY   amoxicillin-clavulanate 875-125 MG tablet Commonly known  as: AUGMENTIN Take 1 tablet by mouth 2 (two) times daily.   cetirizine 10 MG tablet Commonly known as: ZYRTEC Take 1 tablet (10 mg total) by mouth daily. What changed: Another medication with the same name was added. Make sure you understand how and when to take each. Changed by: Valentina Shaggy, MD   cetirizine 10 MG tablet Commonly known as: ZYRTEC Take 1 tablet (10 mg total) by mouth 2 (two) times daily. What changed: You were already taking a medication with the same name, and this prescription was added. Make sure you understand how and when to take each. Changed by: Valentina Shaggy, MD   cyclobenzaprine 10 MG tablet Commonly known as: FLEXERIL TAKE 1 TABLET (10 MG TOTAL) BY MOUTH 3 (THREE) TIMES DAILY AS NEEDED FOR MUSCLE SPASMS.   famotidine 20 MG tablet Commonly known as: PEPCID Take 1 tablet (20 mg total) by mouth 2 (two) times daily. Take 1 tablet 1-2 times per day for itching Started by: Valentina Shaggy, MD   Lasmiditan Succinate 100 MG Tabs Commonly known as: Reyvow Take 1 tablet by mouth daily as needed (No more than 1 tab in 24 h).   naproxen 500 MG tablet Commonly known as: NAPROSYN Take 1 tablet (500 mg total) by mouth every 12 (twelve) hours as needed.   propranolol ER 80 MG 24 hr capsule Commonly known as: INDERAL LA TAKE 1 CAPSULE BY MOUTH ONCE A DAY   sertraline 50 MG tablet Commonly  known as: ZOLOFT TAKE 1 TABLET BY MOUTH AT BEDTIME   simethicone 125 MG chewable tablet Commonly known as: MYLICON Chew 545 mg by mouth in the morning and at bedtime.   Spacer/Aero-Holding Dorise Bullion USE WITH INHALER AS DIRECTED Started by: Valentina Shaggy, MD   SUMAtriptan 6 MG/0.5ML Soaj INJECT 6 MG INTO THE SKIN AS NEEDED (MAY REPEAT IN ONE HOUR. MAXIMUM 2 INJECTIONS IN 24 HOURS.).       Birth History: non-contributory  Developmental History: non-contributory  Past Surgical History: Past Surgical History:  Procedure Laterality Date  . CESAREAN SECTION  09-28-05  . CESAREAN SECTION  01-20-09  . CESAREAN SECTION    . ENDOMETRIAL ABLATION  10/2011   HEROPTIOIN  . KNEE ARTHROSCOPY Right 02/16/2014   Procedure: RIGHT ARTHROSCOPY KNEE;  Surgeon: Kerin Salen, MD;  Location: [facility];  Service: Orthopedics;  Laterality: Right;  . POLYP REMOVAL  5-08   ?CERVICAL OR UTERINE POLYP  . ROBOTIC ASSISTED LAPAROSCOPIC HYSTERECTOMY AND SALPINGECTOMY Bilateral 12/01/2019   Procedure: XI ROBOTIC ASSISTEDTOTAL TOTAL LAPAROSCOPIC HYSTERECTOMY AND RIGHT SALPINGECTOMY , EXTENSIVE LYSIS Thomas ADHESIONS;  Surgeon: [name], MD;  Location: [facility];  Service: Gynecology;  Laterality: Bilateral;  request 8:30am OR time. Requests 2 hours  . TUBAL LIGATION  01-20-09   DONE WITH C-SECTION  . WISDOM TOOTH EXTRACTION Right 2014     Family History: Family History  Problem Relation Age Thomas Onset  . Fibromyalgia Mother   . Multiple sclerosis Mother   . Asthma Father   . Healthy Maternal Grandmother   . Healthy Maternal Grandfather   . Healthy Paternal Grandmother   . Healthy Paternal Grandfather   . Endometriosis Sister   . Healthy Brother   . Healthy Child      Social History: Calayah lives at home with her family (she has four boys).  She lives in a house that was built in 1970.  There is hardwood throughout the home.  She has electric  heating  and central cooling.  There are dogs inside Thomas the home.  There are dust mite covers on the bed, but not the pillows.  She is not exposed to tobacco in any form.  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  She works in the [employer].   Review Thomas Systems  Constitutional: Negative.  Negative for fever, malaise/fatigue and weight loss.  HENT: Positive for congestion. Negative for ear discharge and ear pain.        Positive for postnasal drip.  Eyes: Negative for pain, discharge and redness.  Respiratory: Positive for cough. Negative for sputum production, shortness Thomas breath and wheezing.   Cardiovascular: Negative.  Negative for chest pain and palpitations.  Gastrointestinal: Negative for abdominal pain, constipation, diarrhea, heartburn, nausea and vomiting.  Skin: Positive for itching. Negative for rash.  Neurological: Negative for dizziness and headaches.  Endo/Heme/Allergies: Negative for environmental allergies. Does not bruise/bleed easily.       Objective:   Blood pressure 118/72, pulse 66, temperature 97.6 F (36.4 C), resp. rate 16, height _0  (1.702 m), weight 185 lb 3.2 oz (84 kg), last menstrual period 11/29/2015, SpO2 96 %. Body mass index is 29.01 kg/m.   Physical Exam:   Physical Exam Constitutional:      Appearance: She is well-developed.  HENT:     Head: Normocephalic and atraumatic.     Right Ear: Tympanic membrane, ear canal and external ear normal.     Left Ear: Tympanic membrane, ear canal and external ear normal.     Nose: No nasal deformity, septal deviation, mucosal edema or rhinorrhea.     Right Turbinates: Enlarged and swollen.     Left Turbinates: Enlarged and swollen.     Right Sinus: No maxillary sinus tenderness or frontal sinus tenderness.     Left Sinus: No maxillary sinus tenderness or frontal sinus tenderness.     Comments: Some clear discharge bilaterally.    Mouth/Throat:     Mouth: Mucous membranes are not pale and not  dry.     Pharynx: Uvula midline.  Eyes:     General:        Right eye: No discharge.        Left eye: No discharge.     Conjunctiva/sclera: Conjunctivae normal.     Right eye: Right conjunctiva is not injected. No chemosis.    Left eye: Left conjunctiva is not injected. No chemosis.    Pupils: Pupils are equal, round, and reactive to light.  Cardiovascular:     Rate and Rhythm: Normal rate and regular rhythm.     Heart sounds: Normal heart sounds.  Pulmonary:     Effort: Pulmonary effort is normal. No tachypnea, accessory muscle usage or respiratory distress.     Breath sounds: Normal breath sounds. No wheezing, rhonchi or rales.     Comments: Moving air well in all lung fields.  No increased work Thomas breathing. Chest:     Chest wall: No tenderness.  Lymphadenopathy:     Cervical: No cervical adenopathy.  Skin:    General: Skin is warm.     Capillary Refill: Capillary refill takes less than 2 seconds.     Coloration: Skin is not pale.     Findings: No abrasion, erythema, petechiae or rash. Rash is not papular, urticarial or vesicular.     Comments: Excoriations present on the bilateral arms.  Neurological:     Mental Status: She is alert.  Psychiatric:  Behavior: Behavior is cooperative.      Diagnostic studies:    Spirometry: results abnormal (FEV1: 2.32/78%, FVC: 2.71/76%, FEV1/FVC: 86%)2.    Spirometry consistent with possible restrictive disease. Four puffs albuterol given with normalization Thomas the spirometry.   Allergy Studies:     Airborne Adult Perc - 10/10/20 0845    Time Antigen Placed 0845    Allergen Manufacturer Lavella Hammock    Location Back    Number Thomas Test 59    Panel 1 Select    1. Control-Buffer 50% Glycerol Negative    2. Control-Histamine 1 mg/ml 2+    3. Albumin saline Negative    4. McFall Negative    5. Guatemala Negative    6. Johnson Negative    7. Avon Blue Negative    8. Meadow Fescue Negative    9. Perennial Rye Negative    10. Sweet  Vernal Negative    11. Timothy Negative    12. Cocklebur Negative    13. Burweed Marshelder Negative    14. Ragweed, short Negative    15. Ragweed, Giant Negative    16. Plantain,  English Negative    17. Lamb's Quarters Negative    18. Sheep Sorrell Negative    19. Rough Pigweed Negative    20. Marsh Elder, Rough Negative    21. Mugwort, Common Negative    22. Ash mix Negative    23. Birch mix Negative    24. Beech American Negative    25. Box, Elder Negative    26. Cedar, red Negative    27. Cottonwood, [place] Negative    28. Elm mix Negative    29. Hickory Negative    30. Maple mix Negative    31. Oak, [place] mix Negative    32. Pecan Pollen Negative    33. Pine mix Negative    34. Sycamore Eastern Negative    35. Champion, Black Pollen Negative    36. Alternaria alternata Negative    37. Cladosporium Herbarum Negative    38. Aspergillus mix Negative    39. Penicillium mix Negative    40. Bipolaris sorokiniana (Helminthosporium) Negative    41. Drechslera spicifera (Curvularia) Negative    42. Mucor plumbeus Negative    43. Fusarium moniliforme Negative    44. Aureobasidium pullulans (pullulara) Negative    45. Rhizopus oryzae Negative    46. Botrytis cinera Negative    47. Epicoccum nigrum Negative    48. Phoma betae Negative    49. Candida Albicans Negative    50. Trichophyton mentagrophytes Negative    51. Mite, D Farinae  5,000 AU/ml Negative    52. Mite, D Pteronyssinus  5,000 AU/ml 3+    53. Cat Hair 10,000 BAU/ml Negative    54.  Dog Epithelia Negative    55. Mixed Feathers Negative    56. Horse Epithelia Negative    57. Cockroach, German Negative    58. Mouse Negative    59. Tobacco Leaf Negative          Intradermal - 10/10/20 0916    Time Antigen Placed 5852    Allergen Manufacturer Lavella Hammock    Location Arm    Number Thomas Test 15    Control Negative    Guatemala Negative    Johnson Negative    7 Grass Negative    Ragweed mix Negative    Weed mix  Negative    Tree mix Negative    Mold 1 Negative  Mold 2 Negative    Mold 3 Negative    Mold 4 Negative    Cat Negative    Dog Negative    Cockroach 3+    Mite mix 3+          Food Adult Perc - 10/10/20 0800    Time Antigen Placed 0845    Allergen Manufacturer Lavella Hammock    Location Back    Number Thomas allergen test 1    63. Pineapple Negative           Allergy testing results were read and interpreted by myself, documented by clinical staff.         Salvatore Marvel, MD All[facility] [place]

## 2020-10-15 ENCOUNTER — Encounter: Payer: Self-pay | Admitting: Allergy & Immunology

## 2020-10-16 LAB — ALPHA-GAL PANEL
Allergen Lamb IgE: <0.1 kU/L
Beef IgE: <0.1 kU/L
IgE (Immunoglobulin E), Serum: 58 IU/mL (ref 6–495)
O215-IgE Alpha-Gal: <0.1 kU/L
Pork IgE: <0.1 kU/L

## 2020-10-19 LAB — CMP14+EGFR
ALT: 11 IU/L (ref 0–32)
AST: 22 IU/L (ref 0–40)
Albumin/Globulin Ratio: 1.9 (ref 1.2–2.2)
Albumin: 5 g/dL — ABNORMAL HIGH (ref 3.8–4.8)
Alkaline Phosphatase: 57 IU/L (ref 44–121)
BUN/Creatinine Ratio: 14 (ref 9–23)
BUN: 12 mg/dL (ref 6–20)
Bilirubin Total: 0.8 mg/dL (ref 0.0–1.2)
CO2: 25 mmol/L (ref 20–29)
Calcium: 9.7 mg/dL (ref 8.7–10.2)
Chloride: 102 mmol/L (ref 96–106)
Creatinine, Ser: 0.87 mg/dL (ref 0.57–1.00)
Globulin, Total: 2.7 g/dL (ref 1.5–4.5)
Glucose: 110 mg/dL — ABNORMAL HIGH (ref 65–99)
Potassium: 4.4 mmol/L (ref 3.5–5.2)
Sodium: 139 mmol/L (ref 134–144)
Total Protein: 7.7 g/dL (ref 6.0–8.5)
eGFR: 90 mL/min/{1.73_m2} (ref >59)

## 2020-10-19 LAB — CBC WITH DIFFERENTIAL
Basophils Absolute: 0 10*3/uL (ref 0.0–0.2)
Basos: 0 %
EOS (ABSOLUTE): 0 10*3/uL (ref 0.0–0.4)
Eos: 1 %
Hematocrit: 41.9 % (ref 34.0–46.6)
Hemoglobin: 13.8 g/dL (ref 11.1–15.9)
Immature Grans (Abs): 0 10*3/uL (ref 0.0–0.1)
Immature Granulocytes: 0 %
Lymphocytes Absolute: 1.8 10*3/uL (ref 0.7–3.1)
Lymphs: 39 %
MCH: 29.2 pg (ref 26.6–33.0)
MCHC: 32.9 g/dL (ref 31.5–35.7)
MCV: 89 fL (ref 79–97)
Monocytes Absolute: 0.4 10*3/uL (ref 0.1–0.9)
Monocytes: 8 %
Neutrophils Absolute: 2.3 10*3/uL (ref 1.4–7.0)
Neutrophils: 52 %
RBC: 4.73 x10E6/uL (ref 3.77–5.28)
RDW: 13.8 % (ref 11.7–15.4)
WBC: 4.5 10*3/uL (ref 3.4–10.8)

## 2020-10-19 LAB — TRYPTASE: Tryptase: 4.5 ug/L (ref 2.2–13.2)

## 2020-10-19 LAB — SEDIMENTATION RATE: Sed Rate: 14 mm/hr (ref 0–32)

## 2020-10-19 LAB — CHRONIC URTICARIA: cu index: <2 (ref <10)

## 2020-10-19 LAB — C-REACTIVE PROTEIN: CRP: 3 mg/L (ref 0–10)

## 2020-10-19 LAB — ANTINUCLEAR ANTIBODIES, IFA: ANA Titer 1: NEGATIVE

## 2020-10-20 ENCOUNTER — Other Ambulatory Visit (HOSPITAL_COMMUNITY): Payer: Self-pay

## 2020-10-20 MED FILL — Propranolol HCl Cap ER 24HR 80 MG: ORAL | 30 days supply | Qty: 30 | Fill #1 | Status: AC

## 2020-10-30 ENCOUNTER — Other Ambulatory Visit (HOSPITAL_COMMUNITY): Payer: Self-pay

## 2020-11-01 MED FILL — Fexofenadine HCl Tab 180 MG: ORAL | Qty: 7 | Fill #0 | Status: CN

## 2020-11-02 ENCOUNTER — Other Ambulatory Visit (HOSPITAL_COMMUNITY): Payer: Self-pay

## 2020-11-03 ENCOUNTER — Other Ambulatory Visit (HOSPITAL_COMMUNITY): Payer: Self-pay

## 2020-11-08 NOTE — Progress Notes (Signed)
Penny Thomas  [id number] Dept: [phone]  FOLLOW UP NOTE  Patient ID: JHADA RISK, female    DOB: 03-08-1986  Age: 35 y.o. MRN: [id number] Date of Office Visit: 11/09/2020  Assessment  Chief Complaint: Asthma (Sates the past 2 day the weather has caused her to use her rescue inhaler. )  HPI Penny Thomas is a 35 year old female who presents to the clinic for follow-up visit.  She was last seen in this clinic on 10/10/2020 by Dr. Ernst Bowler for evaluation of asthma, allergic rhinitis, pruritus, and food intolerance.  At today's visit, she reports her asthma has been well controlled up until the last 2 days when she began to experience symptoms including shortness of breath when spending time outside and dry cough with activity.  She denies wheeze with activity or rest.  She continues Alvesco 160-2 puffs twice a day, however, she has been out of this medication for 1 week.  She uses albuterol infrequently with the exception of the last several days.  Allergic rhinitis is reported as moderately well controlled with occasional sneeze for which she continues a daily antihistamine.  She generally switches back and forth between levocetirizine and cetirizine once every 2 months.  She is not currently using Flonase or nasal saline rinses.  She reports that she continues to experience pruritus that occurs almost daily.  She continues Xyzal or cetirizine and Benadryl as needed.  She is not currently taking famotidine.  She works as a Copywriter, advertising and reports that if she walks passed any grass on her way to a clients home she begins itching.  She reports that smelling or touching of oranges makes her itch intensify.  She does report that she is able to eat pineapple at this time without further itching.  Her current medications are listed in the chart.   Drug Allergies:  Allergies  Allergen Reactions   [name] [Elagolix] Other (See Comments)    Hallucinations     Physical Exam: BP 118/62   Pulse 88   Temp 98.8 F (37.1 C) (Temporal)   Resp 16   Ht 5\' 7"  (1.702 m)   Wt 189 lb 3.2 oz (85.8 kg)   LMP 11/29/2015   SpO2 97%   BMI 29.63 kg/m    Physical Exam Vitals reviewed.  Constitutional:      Appearance: Normal appearance.  HENT:     Head: Normocephalic and atraumatic.     Right Ear: Tympanic membrane normal.     Left Ear: Tympanic membrane normal.     Nose:     Comments: Bilateral nares normal. Pharynx normal. Ears normal. Eyes normal.    Mouth/Throat:     Pharynx: Oropharynx is clear.  Eyes:     Conjunctiva/sclera: Conjunctivae normal.  Cardiovascular:     Rate and Rhythm: Normal rate and regular rhythm.     Heart sounds: Normal heart sounds. No murmur heard. Pulmonary:     Effort: Pulmonary effort is normal.     Breath sounds: Normal breath sounds.     Comments: Lungs clear to auscultation Musculoskeletal:        General: Normal range of motion.     Cervical back: Normal range of motion and neck supple.  Skin:    General: Skin is warm and dry.  Neurological:     Mental Status: She is alert and oriented to person, place, and time.  Psychiatric:        Mood and Affect: Mood normal.  Behavior: Behavior normal.        Thought Content: Thought content normal.        Judgment: Judgment normal.    Diagnostics: FVC 3.25, FEV1 3.00.  Predicted FVC 3.49, predicted FEV1 2.91.  Spirometry indicates normal ventilatory function.  Assessment and Plan: 1. Moderate persistent asthma, uncomplicated   2. Allergic rhinoconjunctivitis   3. Pruritus     Meds ordered this encounter  Medications   albuterol (VENTOLIN HFA) 108 (90 Base) MCG/ACT inhaler    Sig: Inhale 4 puffs into the lungs every 4-6 hours as needed    Dispense:  8.5 g    Refill:  1   cetirizine (ZYRTEC) 10 MG tablet    Sig: Take 1 tablet (10 mg total) by mouth daily.    Dispense:  30 tablet    Refill:  5   famotidine (PEPCID) 20 MG tablet    Sig: Take 1  tablet by mouth 1-2 times per day for itching    Dispense:  60 tablet    Refill:  5   ciclesonide (ALVESCO) 160 MCG/ACT inhaler    Sig: Inhale 1 puff into the lungs 2 (two) times daily.    Dispense:  1 each    Refill:  5    Patient Instructions  Asthma Continue Alvesco 160-2 puffs twice a day to prevent cough or wheeze Continue albuterol 2 puffs once every 4 hours as needed for cough or wheeze You may use albuterol 2 puffs 5 to 15 minutes before activity to decrease cough or wheeze  Allergic rhinitis Continue allergen avoidance measures directed toward dust mite and cockroach as listed below Continue an antihistamine once a day as needed for runny nose or itch Consider saline nasal rinses as needed for nasal symptoms. Use this before any medicated nasal sprays for best result  Pruritus  Take the least amount of medications while remaining itch free  Cetirizine (Zyrtec) 10mg  twice a day and famotidine (Pepcid) 20 mg twice a day. If no symptoms for 7-14 days then decrease to. Cetirizine (Zyrtec) 10mg  twice a day and famotidine (Pepcid) 20 mg once a day.  If no symptoms for 7-14 days then decrease to. Cetirizine (Zyrtec) 10mg  twice a day.  If no symptoms for 7-14 days then decrease to. Cetirizine (Zyrtec) 10mg  once a day.  May use Benadryl (diphenhydramine) as needed for breakthrough hives       If symptoms return, then step up dosage  Keep a detailed symptom journal including foods eaten, contact with allergens, medications taken, weather changes.   Call the clinic if this treatment plan is not working well for you  Follow up in 6 months or sooner if needed.   Control of Dust Mite Allergen Dust mites play a major role in allergic asthma and rhinitis. They occur in environments with high humidity wherever human skin is found. Dust mites absorb humidity from the atmosphere (ie, they do not drink) and feed on organic matter (including shed human and animal skin). Dust mites are a  microscopic type of insect that you cannot see with the naked eye. High levels of dust mites have been detected from mattresses, pillows, carpets, upholstered furniture, bed covers, clothes, soft toys and any woven material. The principal allergen of the dust mite is found in its feces. A gram of dust may contain 1,000 mites and 250,000 fecal particles. Mite antigen is easily measured in the air during house cleaning activities. Dust mites do not bite and do not cause harm to  humans, other than by triggering allergies/asthma.  Ways to decrease your exposure to dust mites in your home:  1. Encase mattresses, box springs and pillows with a mite-impermeable barrier or cover  2. Wash sheets, blankets and drapes weekly in hot water (130 F) with detergent and dry them in a dryer on the hot setting.  3. Have the room cleaned frequently with a vacuum cleaner and a damp dust-mop. For carpeting or rugs, vacuuming with a vacuum cleaner equipped with a high-efficiency particulate air (HEPA) filter. The dust mite allergic individual should not be in a room which is being cleaned and should wait 1 hour after cleaning before going into the room.  4. Do not sleep on upholstered furniture (eg, couches).  5. If possible removing carpeting, upholstered furniture and drapery from the home is ideal. Horizontal blinds should be eliminated in the rooms where the person spends the most time (bedroom, study, television room). Washable vinyl, roller-type shades are optimal.  6. Remove all non-washable stuffed toys from the bedroom. Wash stuffed toys weekly like sheets and blankets above.  7. Reduce indoor humidity to less than 50%. Inexpensive humidity monitors can be purchased at most hardware stores. Do not use a humidifier as can make the problem worse and are not recommended.  Control of Cockroach Allergen  Cockroach allergen has been identified as an important cause of acute attacks of asthma, especially in urban  settings.  There are fifty-five species of cockroach that exist in the [place], however only three, the [place], [place] species produce allergen that can affect patients with Asthma.  Allergens can be obtained from fecal particles, egg casings and secretions from cockroaches.    Remove food sources. Reduce access to water. Seal access and entry points. Spray runways with 0.5-1% Diazinon or Chlorpyrifos Blow boric acid power under stoves and refrigerator. Place bait stations (hydramethylnon) at feeding sites.   Return in about 6 months (around 05/11/2021), or if symptoms worsen or fail to improve.    Thank you for the opportunity to care for this patient.  Please do not hesitate to contact me with questions.  Gareth Morgan, FNP [facility]

## 2020-11-08 NOTE — Patient Instructions (Addendum)
Asthma Continue Alvesco 160-2 puffs twice a day to prevent cough or wheeze Continue albuterol 2 puffs once every 4 hours as needed for cough or wheeze You may use albuterol 2 puffs 5 to 15 minutes before activity to decrease cough or wheeze  Allergic rhinitis Continue allergen avoidance measures directed toward dust mite and cockroach as listed below Continue an antihistamine once a day as needed for runny nose or itch Consider saline nasal rinses as needed for nasal symptoms. Use this before any medicated nasal sprays for best result  Pruritus  Take the least amount of medications while remaining itch free  Cetirizine (Zyrtec) 10mg  twice a day and famotidine (Pepcid) 20 mg twice a day. If no symptoms for 7-14 days then decrease to. Cetirizine (Zyrtec) 10mg  twice a day and famotidine (Pepcid) 20 mg once a day.  If no symptoms for 7-14 days then decrease to. Cetirizine (Zyrtec) 10mg  twice a day.  If no symptoms for 7-14 days then decrease to. Cetirizine (Zyrtec) 10mg  once a day.  May use Benadryl (diphenhydramine) as needed for breakthrough hives       If symptoms return, then step up dosage  Keep a detailed symptom journal including foods eaten, contact with allergens, medications taken, weather changes.   Call the clinic if this treatment plan is not working well for you  Follow up in 6 months or sooner if needed.   Control of Dust Mite Allergen Dust mites play a major role in allergic asthma and rhinitis. They occur in environments with high humidity wherever human skin is found. Dust mites absorb humidity from the atmosphere (ie, they do not drink) and feed on organic matter (including shed human and animal skin). Dust mites are a microscopic type of insect that you cannot see with the naked eye. High levels of dust mites have been detected from mattresses, pillows, carpets, upholstered furniture, bed covers, clothes, soft toys and any woven material. The principal allergen of the  dust mite is found in its feces. A gram of dust may contain 1,000 mites and 250,000 fecal particles. Mite antigen is easily measured in the air during house cleaning activities. Dust mites do not bite and do not cause harm to humans, other than by triggering allergies/asthma.  Ways to decrease your exposure to dust mites in your home:  1. Encase mattresses, box springs and pillows with a mite-impermeable barrier or cover  2. Wash sheets, blankets and drapes weekly in hot water (130 F) with detergent and dry them in a dryer on the hot setting.  3. Have the room cleaned frequently with a vacuum cleaner and a damp dust-mop. For carpeting or rugs, vacuuming with a vacuum cleaner equipped with a high-efficiency particulate air (HEPA) filter. The dust mite allergic individual should not be in a room which is being cleaned and should wait 1 hour after cleaning before going into the room.  4. Do not sleep on upholstered furniture (eg, couches).  5. If possible removing carpeting, upholstered furniture and drapery from the home is ideal. Horizontal blinds should be eliminated in the rooms where the person spends the most time (bedroom, study, television room). Washable vinyl, roller-type shades are optimal.  6. Remove all non-washable stuffed toys from the bedroom. Wash stuffed toys weekly like sheets and blankets above.  7. Reduce indoor humidity to less than 50%. Inexpensive humidity monitors can be purchased at most hardware stores. Do not use a humidifier as can make the problem worse and are not recommended.  Control  of Cockroach Allergen  Cockroach allergen has been identified as an important cause of acute attacks of asthma, especially in urban settings.  There are fifty-five species of cockroach that exist in the [place], however only three, the [place], [place] species produce allergen that can affect patients with Asthma.  Allergens can be obtained from fecal particles, egg  casings and secretions from cockroaches.    Remove food sources. Reduce access to water. Seal access and entry points. Spray runways with 0.5-1% Diazinon or Chlorpyrifos Blow boric acid power under stoves and refrigerator. Place bait stations (hydramethylnon) at feeding sites.

## 2020-11-09 ENCOUNTER — Other Ambulatory Visit: Payer: Self-pay

## 2020-11-09 ENCOUNTER — Ambulatory Visit: Payer: Managed Care, Other (non HMO) | Admitting: Family Medicine

## 2020-11-09 ENCOUNTER — Other Ambulatory Visit (HOSPITAL_COMMUNITY): Payer: Self-pay

## 2020-11-09 ENCOUNTER — Encounter: Payer: Self-pay | Admitting: Family Medicine

## 2020-11-09 VITALS — BP 118/62 | HR 88 | Temp 98.8°F | Resp 16 | Ht 67.0 in | Wt 189.2 lb

## 2020-11-09 DIAGNOSIS — H1010 Acute atopic conjunctivitis, unspecified eye: Secondary | ICD-10-CM

## 2020-11-09 DIAGNOSIS — L299 Pruritus, unspecified: Secondary | ICD-10-CM | POA: Diagnosis not present

## 2020-11-09 DIAGNOSIS — J309 Allergic rhinitis, unspecified: Secondary | ICD-10-CM

## 2020-11-09 DIAGNOSIS — J4540 Moderate persistent asthma, uncomplicated: Secondary | ICD-10-CM | POA: Diagnosis not present

## 2020-11-09 MED ORDER — ALVESCO 160 MCG/ACT IN AERS
1.0000 | INHALATION_SPRAY | Freq: Two times a day (BID) | RESPIRATORY_TRACT | 5 refills | Status: DC
Start: 2020-11-09 — End: 2020-11-15
  Filled 2020-11-09: qty 6.1, 30d supply, fill #0

## 2020-11-09 MED ORDER — CETIRIZINE HCL 10 MG PO TABS
10.0000 mg | ORAL_TABLET | Freq: Every day | ORAL | 5 refills | Status: DC
  Filled 2020-11-09: qty 30, 30d supply, fill #0
  Filled 2020-12-19: qty 30, 30d supply, fill #1
  Filled 2021-01-08: qty 30, 30d supply, fill #2
  Filled 2021-02-17: qty 30, 30d supply, fill #3
  Filled 2021-03-20: qty 30, 30d supply, fill #4
  Filled 2021-05-14: qty 30, 30d supply, fill #5

## 2020-11-09 MED ORDER — FAMOTIDINE 20 MG PO TABS
20.0000 mg | ORAL_TABLET | Freq: Two times a day (BID) | ORAL | 5 refills | Status: DC
  Filled 2020-11-09: qty 60, 30d supply, fill #0
  Filled 2021-09-14: qty 60, 30d supply, fill #1

## 2020-11-09 MED ORDER — ALBUTEROL SULFATE HFA 108 (90 BASE) MCG/ACT IN AERS
INHALATION_SPRAY | RESPIRATORY_TRACT | 1 refills | Status: DC
  Filled 2020-11-09: qty 8.5, 16d supply, fill #0

## 2020-11-10 ENCOUNTER — Other Ambulatory Visit: Payer: Self-pay | Admitting: Family Medicine

## 2020-11-10 ENCOUNTER — Other Ambulatory Visit (HOSPITAL_COMMUNITY): Payer: Self-pay

## 2020-11-10 ENCOUNTER — Telehealth: Payer: Self-pay | Admitting: *Deleted

## 2020-11-10 ENCOUNTER — Encounter: Payer: Self-pay | Admitting: Family Medicine

## 2020-11-10 NOTE — Telephone Encounter (Signed)
PA has been submitted through CoverMyMeds for Alvesco 160 and is currently pending approval/denial.

## 2020-11-13 NOTE — Telephone Encounter (Signed)
PA was denied for Alvesco stating that the patient must try and fail Flovent Diskus /HFA and QVAR. Please advise possible need for change in medication.

## 2020-11-13 NOTE — Telephone Encounter (Signed)
Called and left a voicemail asking for patient to return call to advise.

## 2020-11-13 NOTE — Telephone Encounter (Signed)
Can you please have her use the copay card instead of her insurance? She should be able to get 2 inhalers for $50. Thank you

## 2020-11-15 ENCOUNTER — Other Ambulatory Visit: Payer: Self-pay | Admitting: *Deleted

## 2020-11-15 MED ORDER — ALVESCO 160 MCG/ACT IN AERS: 1.0000 | INHALATION_SPRAY | Freq: Two times a day (BID) | RESPIRATORY_TRACT | 5 refills | Status: AC

## 2020-11-15 NOTE — Telephone Encounter (Signed)
Called and spoke with the patient and she stated that she has had an issue with picking up the medication but she would be fine spending $50 for 2 inhalers. Prescription has been sent to [facility] in [place]. Patient verbalized understanding.

## 2020-11-22 ENCOUNTER — Other Ambulatory Visit: Payer: Self-pay | Admitting: Family Medicine

## 2020-11-22 DIAGNOSIS — F418 Other specified anxiety disorders: Secondary | ICD-10-CM

## 2020-11-22 DIAGNOSIS — F410 Panic disorder [episodic paroxysmal anxiety] without agoraphobia: Secondary | ICD-10-CM

## 2020-11-22 MED FILL — Propranolol HCl Cap ER 24HR 80 MG: ORAL | 30 days supply | Qty: 30 | Fill #2 | Status: AC

## 2020-11-23 ENCOUNTER — Other Ambulatory Visit (HOSPITAL_COMMUNITY): Payer: Self-pay

## 2020-11-23 MED ORDER — SERTRALINE HCL 50 MG PO TABS
ORAL_TABLET | Freq: Every day | ORAL | 0 refills | Status: DC
  Filled 2020-11-23: qty 30, 30d supply, fill #0

## 2020-12-19 ENCOUNTER — Other Ambulatory Visit (HOSPITAL_COMMUNITY): Payer: Self-pay

## 2020-12-19 MED FILL — Propranolol HCl Cap ER 24HR 80 MG: ORAL | 30 days supply | Qty: 30 | Fill #3 | Status: AC

## 2020-12-19 NOTE — Progress Notes (Signed)
Chief Complaint  Patient presents with   Anxiety    Med check for zoloft. Patient has been on for about 3 months and has been having sweats-thinks it may be a side effect and would like to discuss. Also experiencing dry mouth.    Immunizations    Covid vaccine declined.     Patient of Vickie's presents for med check.  Situational anxiety and panic attacks: Zoloft was started in 09/2019.  She switched to taking it in the morning in 12/2019 (caused insomnia). She is sleeping better since changed to morning dosing. She has been doing very well with respect to anxiety--previously had panic attacks when driving over bridges.  She reports that she had hysterectomy in June. After hysterectomy she notices episodes of sweats.  This occurs when wearing PPE at work, or sometimes just when walking to her car. The sweats are from the waist up. Still has 1 ovary. Hysterectomy was for endometriosis, also found fibroids.  She reports having had labs to evaluate this by GYN--chart review showed 02/2020 labs--thyroid studies, FSH, normal Told to take MVI.  Overall she finds that the Zoloft is working very well. Heart races some, but much better--scared of heights, tunnels, bridges. If the Zoloft is causing the hot flashes, she would be interested in changing to something else.  Took prozac 3-4 years ago. Stopped due to the issue resolved, not due to side efects.  PMH, PSH, SH reviewed H/o Gerd, constipation, and insomnia Migraines, under the care of Dr. Tomi Likens Asthma, allergies and pruritis, under care of allergist.  Treatment includes pepcid, zyrtec, alvesco, albuterol  Outpatient Encounter Medications as of 12/20/2020  Medication Sig   ALVESCO 160 MCG/ACT inhaler Inhale 1 puff into the lungs 2 (two) times daily.   cetirizine (ZYRTEC) 10 MG tablet Take 1 tablet (10 mg total) by mouth daily.   famotidine (PEPCID) 20 MG tablet Take 1 tablet by mouth 1 - 2 times per day for itching   FLUoxetine (PROZAC)  20 MG tablet Start at 1/2 tablet by mouth once daily for the first week, then increase to full tablet if tolerated   propranolol ER (INDERAL LA) 80 MG 24 hr capsule TAKE 1 CAPSULE BY MOUTH ONCE A DAY   simethicone (MYLICON) 0000000 MG chewable tablet Chew 125 mg by mouth in the morning and at bedtime.   [DISCONTINUED] sertraline (ZOLOFT) 50 MG tablet Take one tablet by mouth at bedtime.   albuterol (VENTOLIN HFA) 108 (90 Base) MCG/ACT inhaler Inhale 4 puffs into the lungs every 4-6 hours as needed (Patient not taking: Reported on 12/20/2020)   cyclobenzaprine (FLEXERIL) 10 MG tablet TAKE 1 TABLET (10 MG TOTAL) BY MOUTH 3 (THREE) TIMES DAILY AS NEEDED FOR MUSCLE SPASMS. (Patient not taking: Reported on 12/20/2020)   fexofenadine (ALLEGRA) 180 MG tablet TAKE 1 TABLET BY MOUTH ONCE A DAY (Patient not taking: Reported on 12/20/2020)   Lasmiditan Succinate (REYVOW) 100 MG TABS Take 1 tablet by mouth daily as needed (No more than 1 tab in 24 h). (Patient not taking: Reported on 12/20/2020)   naproxen (NAPROSYN) 500 MG tablet Take 1 tablet (500 mg total) by mouth every 12 (twelve) hours as needed. (Patient not taking: Reported on 12/20/2020)   SUMAtriptan 6 MG/0.5ML SOAJ INJECT 6 MG INTO THE SKIN AS NEEDED (MAY REPEAT IN ONE HOUR. MAXIMUM 2 INJECTIONS IN 24 HOURS.). (Patient not taking: Reported on 12/20/2020)   [DISCONTINUED] albuterol (PROVENTIL HFA;VENTOLIN HFA) 108 (90 Base) MCG/ACT inhaler Inhale 1-2 puffs into the lungs  every 6 (six) hours as needed for wheezing or shortness of breath.   [DISCONTINUED] esomeprazole (NEXIUM) 40 MG capsule TAKE 1 CAPSULE BY MOUTH ONCE A DAY AT 12 NOON   No facility-administered encounter medications on file as of 12/20/2020.   Taking zoloft, not prozac, prior to today's visit  Allergies  Allergen Reactions   [name] [Elagolix] Other (See Comments)    Hallucinations    ROS:  no fever, chills, URI symptoms, headaches, dizzines, chest pain, palpitations.  +sweats per HPI.  Anxiety is well controlled, insomnia resolved.    PHYSICAL EXAM:  BP 112/68   Pulse 80   Ht '5\' 7"'$  (1.702 m)   Wt 190 lb (86.2 kg)   LMP 11/29/2015   BMI 29.76 kg/m   Wt Readings from Last 3 Encounters:  12/20/20 190 lb (86.2 kg)  11/09/20 189 lb 3.2 oz (85.8 kg)  10/10/20 185 lb 3.2 oz (84 kg)   Pleasant, well-appearing female, in good spirits, in no distress She is alert, oriented, normal speech, eye contact, hygiene and grooming. Normal mood, affect   ASSESSMENT/PLAN:  Situational anxiety - improved with sertraline, but causing diaphoresis--trial change to prozac, prev tolerated, less likely to cause - Plan: FLUoxetine (PROZAC) 20 MG tablet  Diaphoresis - suspect is SE due to sertraline (still has 1 ovary, no night sweats, FSH normal); try prozac to see if fewer SE, to treat anxiety  Vaccine counseling - encouraged COVID vaccines (declined), yearly flu shots. Consider pneumovax due to asthma, can d/w allergist, PCP  Start 1/2 tablet of the fluoxetine along with 1/2 tablet of the sertraline. After a week, if you're feeling okay, you can stop the sertraline and increase to the full tablet of the fluoxetine. If you're feeling any side effects, or any concerns,  you can stay on the 1/2 tablet for a little longer.  Ultimately the full tablet will be the equivalent of your zoloft dose.  Since you tolerated fluoxetine in the past, hopefully you won't have any issues. The risks of sweating seem to be less. Take it in the morning. Let us know if you're having any issues.   I spent 36 minutes dedicated to the care of this patient, including pre-visit review of records, face to face time, post-visit ordering of testing and documentation.

## 2020-12-20 ENCOUNTER — Encounter: Payer: Self-pay | Admitting: Family Medicine

## 2020-12-20 ENCOUNTER — Other Ambulatory Visit (HOSPITAL_COMMUNITY): Payer: Self-pay

## 2020-12-20 ENCOUNTER — Other Ambulatory Visit: Payer: Self-pay

## 2020-12-20 ENCOUNTER — Ambulatory Visit: Payer: Managed Care, Other (non HMO) | Admitting: Family Medicine

## 2020-12-20 VITALS — BP 112/68 | HR 80 | Ht 67.0 in | Wt 190.0 lb

## 2020-12-20 DIAGNOSIS — Z7185 Encounter for immunization safety counseling: Secondary | ICD-10-CM

## 2020-12-20 DIAGNOSIS — F418 Other specified anxiety disorders: Secondary | ICD-10-CM

## 2020-12-20 DIAGNOSIS — R61 Generalized hyperhidrosis: Secondary | ICD-10-CM | POA: Diagnosis not present

## 2020-12-20 MED ORDER — FLUOXETINE HCL 20 MG PO TABS
ORAL_TABLET | ORAL | 5 refills | Status: DC
  Filled 2020-12-20: qty 30, 30d supply, fill #0
  Filled 2021-01-08: qty 30, 30d supply, fill #1
  Filled 2021-02-17: qty 30, 30d supply, fill #2
  Filled 2021-03-20: qty 30, 30d supply, fill #3
  Filled 2021-04-13: qty 30, 30d supply, fill #4

## 2020-12-20 NOTE — Patient Instructions (Addendum)
  Start 1/2 tablet of the fluoxetine along with 1/2 tablet of the sertraline. After a week, if you're feeling okay, you can stop the sertraline and increase to the full tablet of the fluoxetine. If you're feeling any side effects, or any concerns,  you can stay on the 1/2 tablet for a little longer.  Ultimately the full tablet will be the equivalent of your zoloft dose.  Since you tolerated fluoxetine in the past, hopefully you won't have any issues. The risks of sweating seem to be less. Take it in the morning. Let us know if you're having any issues.  Yearly flu shots are encouraged. Consider a pneumonia vaccine--you may want to discuss this with your allergist or Vickie. COVID vaccines are recommended. Please be sure to test regularly if sick, and get treatment (oral antiviral medications and/or infusions) to prevent complications and serious illness.  We are starting with a tablet (so you can cut in half), and 30 day supply (to ensure that you tolerate it). If you want to change to a 90 day prescription of capsules once you know it is working well, just let us know.

## 2021-01-08 ENCOUNTER — Other Ambulatory Visit (HOSPITAL_COMMUNITY): Payer: Self-pay

## 2021-01-08 ENCOUNTER — Other Ambulatory Visit: Payer: Self-pay | Admitting: Neurology

## 2021-01-08 MED ORDER — NAPROXEN 500 MG PO TABS
ORAL_TABLET | Freq: Two times a day (BID) | ORAL | 1 refills | Status: DC | PRN
  Filled 2021-01-08: qty 60, 30d supply, fill #0
  Filled 2021-06-17: qty 60, 30d supply, fill #1

## 2021-01-09 ENCOUNTER — Other Ambulatory Visit: Payer: Self-pay

## 2021-01-09 ENCOUNTER — Other Ambulatory Visit (HOSPITAL_COMMUNITY): Payer: Self-pay

## 2021-01-09 DIAGNOSIS — G43109 Migraine with aura, not intractable, without status migrainosus: Secondary | ICD-10-CM

## 2021-01-09 MED ORDER — PROPRANOLOL HCL ER 80 MG PO CP24
ORAL_CAPSULE | Freq: Every day | ORAL | 5 refills | Status: DC
  Filled 2021-01-09: qty 30, fill #0
  Filled 2021-01-23: qty 30, 30d supply, fill #0
  Filled 2021-02-17: qty 30, 30d supply, fill #1
  Filled 2021-03-20: qty 30, 30d supply, fill #2
  Filled 2021-04-13: qty 30, 30d supply, fill #3
  Filled 2021-05-14: qty 30, 30d supply, fill #4

## 2021-01-09 NOTE — Telephone Encounter (Signed)
My chart message received from pt, Propranolol needs to be refilled.   Propranolol refilled

## 2021-01-24 ENCOUNTER — Other Ambulatory Visit (HOSPITAL_COMMUNITY): Payer: Self-pay

## 2021-01-24 MED ORDER — BENZONATATE 200 MG PO CAPS
ORAL_CAPSULE | ORAL | 0 refills | Status: DC
  Filled 2021-01-24: qty 21, 7d supply, fill #0

## 2021-01-25 ENCOUNTER — Other Ambulatory Visit: Payer: Self-pay | Admitting: Family Medicine

## 2021-01-25 ENCOUNTER — Encounter: Payer: Self-pay | Admitting: Family Medicine

## 2021-01-25 ENCOUNTER — Other Ambulatory Visit: Payer: Self-pay

## 2021-01-25 ENCOUNTER — Other Ambulatory Visit (HOSPITAL_COMMUNITY): Payer: Self-pay

## 2021-01-25 ENCOUNTER — Telehealth: Payer: Managed Care, Other (non HMO) | Admitting: Family Medicine

## 2021-01-25 ENCOUNTER — Other Ambulatory Visit (INDEPENDENT_AMBULATORY_CARE_PROVIDER_SITE_OTHER): Payer: Managed Care, Other (non HMO)

## 2021-01-25 VITALS — Temp 98.0°F | Ht 67.0 in | Wt 190.0 lb

## 2021-01-25 DIAGNOSIS — R0981 Nasal congestion: Secondary | ICD-10-CM

## 2021-01-25 DIAGNOSIS — J040 Acute laryngitis: Secondary | ICD-10-CM

## 2021-01-25 DIAGNOSIS — J3489 Other specified disorders of nose and nasal sinuses: Secondary | ICD-10-CM

## 2021-01-25 DIAGNOSIS — R059 Cough, unspecified: Secondary | ICD-10-CM | POA: Diagnosis not present

## 2021-01-25 DIAGNOSIS — J4520 Mild intermittent asthma, uncomplicated: Secondary | ICD-10-CM

## 2021-01-25 LAB — POC COVID19 BINAXNOW: SARS Coronavirus 2 Ag: POSITIVE — AB

## 2021-01-25 MED ORDER — NIRMATRELVIR/RITONAVIR (PAXLOVID)TABLET
3.0000 | ORAL_TABLET | Freq: Two times a day (BID) | ORAL | 0 refills | Status: AC
  Filled 2021-01-25: qty 30, 5d supply, fill #0

## 2021-01-25 MED ORDER — PROMETHAZINE-DM 6.25-15 MG/5ML PO SYRP
5.0000 mL | ORAL_SOLUTION | Freq: Every evening | ORAL | 0 refills | Status: DC | PRN
  Filled 2021-01-25: qty 118, 23d supply, fill #0

## 2021-01-25 MED ORDER — QVAR REDIHALER 80 MCG/ACT IN AERB
1.0000 | INHALATION_SPRAY | Freq: Two times a day (BID) | RESPIRATORY_TRACT | 0 refills | Status: DC
  Filled 2021-01-25: qty 10.6, 30d supply, fill #0

## 2021-01-25 NOTE — Progress Notes (Signed)
Please let her know that she is positive for Covid. She should immediately quarantine for the next 5 days or possibly longer if she is not improving significantly. I can prescribe her the oral antiviral drug Paxlovid if she would like to start it today. The common side effects are diarrhea, headache, nausea and abnormal taste. Diarrhea is the most common side effect. Let me know if she would like to take this medication. Also, if she becomes more short of breath or has chest pain or any other worrisome symptoms I encourage her to go get checked at an urgent care or the emergency department.
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
## 2021-01-30 ENCOUNTER — Encounter: Payer: Self-pay | Admitting: Family Medicine

## 2021-02-12 ENCOUNTER — Encounter: Payer: Self-pay | Admitting: Nurse Practitioner

## 2021-02-12 ENCOUNTER — Ambulatory Visit (INDEPENDENT_AMBULATORY_CARE_PROVIDER_SITE_OTHER): Payer: Managed Care, Other (non HMO) | Admitting: Nurse Practitioner

## 2021-02-12 ENCOUNTER — Other Ambulatory Visit: Payer: Self-pay

## 2021-02-12 VITALS — BP 118/76 | Ht 66.0 in | Wt 189.0 lb

## 2021-02-12 DIAGNOSIS — Z01419 Encounter for gynecological examination (general) (routine) without abnormal findings: Secondary | ICD-10-CM | POA: Diagnosis not present

## 2021-02-12 DIAGNOSIS — N941 Unspecified dyspareunia: Secondary | ICD-10-CM | POA: Diagnosis not present

## 2021-02-12 NOTE — Progress Notes (Signed)
   Penny Thomas 09-Nov-1985 [id number]   History:  35 y.o. G3P0004 presents for annual exam. S/P 11/2019 robotic TLH with right salpingectomy with extensive lysis of adhesions for chronic severe pelvic pain/endometriosis. Normal pap history. She complains of mild deep pain with intercourse. She had similar pain before surgery.   Gynecologic History Patient's last menstrual period was 11/29/2015.   Contraception/Family planning: status post hysterectomy Sexually active: Yes  Health Maintenance Last Pap: 08/10/2019. Results were: Normal Last mammogram: Not indicated Last colonoscopy: Not indicated Last Dexa: Not indicated   Past medical history, past surgical history, family history and social history were all reviewed and documented in the EPIC chart. Married. 4 sons ages 39, 59 (twins), and 80. 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  ROS:  A ROS was performed and pertinent positives and negatives are included.  Exam:  Vitals:   02/12/21 1518  BP: 118/76  Weight: 189 lb (85.7 kg)  Height: '5\' 6"'$  (1.676 m)   Body mass index is 30.51 kg/m.  General appearance:  Normal Thyroid:  Symmetrical, normal in size, without palpable masses or nodularity. Respiratory  Auscultation:  Clear without wheezing or rhonchi Cardiovascular  Auscultation:  Regular rate, without rubs, murmurs or gallops  Edema/varicosities:  Not grossly evident Abdominal  Soft,nontender, without masses, guarding or rebound.  Liver/spleen:  No organomegaly noted  Hernia:  None appreciated  Skin  Inspection:  Grossly normal Breasts: Examined lying and sitting.   Right: Without masses, retractions, nipple discharge or axillary adenopathy.   Left: Without masses, retractions, nipple discharge or axillary adenopathy. Genitourinary   Inguinal/mons:  Normal without inguinal adenopathy  External genitalia:  Normal appearing vulva with no masses, tenderness, or lesions  BUS/Urethra/Skene's glands:  Normal  Vagina:  Normal  appearing with normal color and discharge, no lesions  Cervix:  Absent  Uterus:  Absent  Adnexa/parametria:     Rt: Normal in size, without masses or tenderness.   Lt: Normal in size, without masses or tenderness.  Anus and perineum: Normal  Patient informed chaperone available to be present for breast and pelvic exam. Patient has requested no chaperone to be present. Patient has been advised what will be completed during breast and pelvic exam.   Assessment/Plan:  35 y.o. G3P0004 for annual exam.   Well female exam with routine gynecological exam - Education provided on SBEs, importance of preventative screenings, current guidelines, high calcium diet, regular exercise, and multivitamin daily. Labs done 09/2020 with allergist.   Female dyspareunia - She complains of mild deep pain with intercourse. She had similar pain prior to her hysterectomy. Pain likely from recent surgery. We discussed nature of surgery, healing process, and possible scar tissue.   Screening for cervical cancer - Normal pap history. Discussed current guidelines and option to stop screening. She is agreeable.   Return in 1 year for annual.   Tamela Gammon DNP, 3:35 PM 02/12/2021

## 2021-02-17 ENCOUNTER — Other Ambulatory Visit (HOSPITAL_COMMUNITY): Payer: Self-pay

## 2021-02-26 ENCOUNTER — Encounter: Payer: Self-pay | Admitting: Physical Medicine and Rehabilitation

## 2021-02-28 NOTE — Telephone Encounter (Signed)
Left message #1 to schedule OV.

## 2021-03-01 ENCOUNTER — Telehealth: Payer: Self-pay | Admitting: Physical Medicine and Rehabilitation

## 2021-03-01 NOTE — Telephone Encounter (Signed)
Patient called needing to schedule an appointment with Dr Ernestina Patches for her back    [phone]

## 2021-03-01 NOTE — Telephone Encounter (Signed)
Scheduled for OV. 

## 2021-03-06 ENCOUNTER — Ambulatory Visit (INDEPENDENT_AMBULATORY_CARE_PROVIDER_SITE_OTHER): Payer: Managed Care, Other (non HMO) | Admitting: Physical Medicine and Rehabilitation

## 2021-03-06 ENCOUNTER — Other Ambulatory Visit: Payer: Self-pay

## 2021-03-06 ENCOUNTER — Encounter: Payer: Self-pay | Admitting: Physical Medicine and Rehabilitation

## 2021-03-06 VITALS — BP 128/83 | HR 90

## 2021-03-06 DIAGNOSIS — M5441 Lumbago with sciatica, right side: Secondary | ICD-10-CM | POA: Diagnosis not present

## 2021-03-06 DIAGNOSIS — M48061 Spinal stenosis, lumbar region without neurogenic claudication: Secondary | ICD-10-CM | POA: Diagnosis not present

## 2021-03-06 DIAGNOSIS — M5116 Intervertebral disc disorders with radiculopathy, lumbar region: Secondary | ICD-10-CM

## 2021-03-06 DIAGNOSIS — M5416 Radiculopathy, lumbar region: Secondary | ICD-10-CM | POA: Diagnosis not present

## 2021-03-06 DIAGNOSIS — G8929 Other chronic pain: Secondary | ICD-10-CM

## 2021-03-06 NOTE — Progress Notes (Signed)
Penny Thomas - 35 y.o. female MRN [id number]  Date of birth: [birth year]  Office Visit Note: Visit Date: 03/06/2021 PCP: Girtha Rm, PA-C Referred by: Girtha Rm, PA-C  Subjective: Chief Complaint  Patient presents with   Lower Back - Pain   Right Leg - Pain, Numbness, Tingling   Right Foot - Pain, Numbness, Tingling   HPI: Penny Thomas is a 35 y.o. female who comes in today for evaluation of chronic, worsening and severe right-sided lower back pain radiating down to lateral leg, knee and foot.  Patient had left L5-S1 interlaminar epidural steroid injection on 08/14/2020 which she reports gave her greater than 50% pain relief for at least 3 months.  Patient states that this pain feels similar, however is now on the right side of her lower back.  Patient reports pain is exacerbated by prolonged sitting, describes as soreness sensation and currently rates as 8 out of 10.  Patient states some relief of pain with use of heat and ibuprofen.  Patient's lumbar MRI from 2020 exhibits shallow disc protrusion at L5-S1 closely approximating/potentially irritating the Bilateral S1 nerve roots.  No high-grade spinal canal stenosis noted. Patient states she did attend physical therapy for lower back issues at [facility], which she reports did provide some pain relief.  Patient states she is a very active person, however severe pain is making it difficult to sleep and is also making it hard for her to work.  Patient denies focal weakness, numbness and tingling.  Patient denies recent trauma or falls.  Review of Systems  Musculoskeletal:  Positive for back pain.  Neurological:  Negative for tingling, sensory change, focal weakness and weakness.  Otherwise per HPI.  Assessment & Plan: Visit Diagnoses:    ICD-10-CM   1. Lumbar radiculopathy  M54.16 Ambulatory referral to Physical Medicine Rehab    2. Radiculopathy due to lumbar intervertebral disc disorder   M51.16     3. Bilateral stenosis of lateral recess of lumbar spine  M48.061     4. Chronic right-sided low back pain with right-sided sciatica  M54.41    G89.29        Plan: Findings:  Chronic, worsening and severe right-sided lower back pain radiating down to lateral leg, knee and foot.  Patient continues to have excruciating pain despite good conservative therapies such as formal physical therapy, application of heat and medications.  Patient's clinical presentation and exam are consistent with classic L5 distribution radiculitis radiculopathy likely from the lateral recess narrowing and broad bulge at L5-S1.  Diagnostically prior epidural injection was very beneficial for the left side.  We discussed patient's lumbar MRI in detail today using images.  We believe the next step is to perform a diagnostic and hopefully therapeutic right L5-S1 interlaminar epidural steroid injection under fluoroscopic guidance.  If patient gets good relief from this injection we will continue to monitor, however if she continues to have pain we will consider regrouping with physical therapy and/or adjusting medications.  No red flag symptoms noted today upon exam.   Meds & Orders: No orders of the defined types were placed in this encounter.   Orders Placed This Encounter  Procedures   Ambulatory referral to Physical Medicine Rehab    Follow-up: Return in about 1 week (around 03/13/2021) for Right L5-S1 interlaminar epidural steroid injection.   Procedures: No procedures performed      Clinical History: MRI LUMBAR SPINE WITHOUT CONTRAST   TECHNIQUE: Multiplanar, multisequence MR imaging  of the lumbar spine was performed. No intravenous contrast was administered.   COMPARISON:  Prior radiograph from 12/21/2018.   FINDINGS: Segmentation: Standard. Lowest well-formed disc space labeled the L5-S1 level.   Alignment: Physiologic with preservation of the normal lumbar lordosis. No listhesis.    Vertebrae: Vertebral body height maintained without evidence for acute or chronic fracture. Bone marrow signal intensity within normal limits. No discrete or worrisome osseous lesions. No abnormal marrow edema.   Conus medullaris and cauda equina: Conus extends to the L1 level. Conus medullaris within normal limits. Incidental note made of a small fatty filum terminale. Nerve roots of the cauda equina otherwise unremarkable.   Paraspinal and other soft tissues: Paraspinous soft tissues within normal limits. Visualized visceral structures are normal.   Disc levels:   L1-2:  Unremarkable.   L2-3:  Unremarkable.   L3-4: Normal interspace. Mild bilateral facet hypertrophy. No stenosis or impingement.   L4-5: Normal interspace. Mild bilateral facet hypertrophy. No stenosis or impingement.   L5-S1: Disc desiccation. Superimposed shallow central disc protrusion with associated annular fissure. Protruding disc closely approximates both of the descending S1 nerve roots as they course through the lateral recesses without frank impingement or displacement. No significant spinal stenosis. Foramina remain patent.   IMPRESSION: 1. Shallow central disc protrusion at L5-S1, closely approximating and potentially irritating either of the descending S1 nerve roots. No frank impingement. 2. Otherwise essentially normal MRI of the lumbar spine.     Electronically Signed   By: Jeannine Boga M.D.   On: 05/24/2019 23:19   She reports that she has never smoked. She has never used smokeless tobacco. No results for input(s): HGBA1C, LABURIC in the last 8760 hours.  Objective:  VS:  HT:    WT:   BMI:     BP:128/83  HR:90bpm  TEMP: ( )  RESP:  Physical Exam Vitals and nursing note reviewed.  HENT:     Head: Normocephalic.     Right Ear: External ear normal.     Left Ear: External ear normal.     Nose: Nose normal.     Mouth/Throat:     Mouth: Mucous membranes are moist.   Eyes:     Pupils: Pupils are equal, round, and reactive to light.  Cardiovascular:     Rate and Rhythm: Normal rate.     Pulses: Normal pulses.  Pulmonary:     Effort: Pulmonary effort is normal.  Abdominal:     General: Abdomen is flat. There is no distension.  Musculoskeletal:        General: Tenderness present.     Cervical back: Normal range of motion.     Comments: Pt is slow to rise from seated position to standing. Good lumbar range of motion. Strong distal strength without clonus, no pain upon palpation of greater trochanters. Sensation intact bilaterally. Dysesthesias noted to right L5 dermatome. Walks independently, gait steady.     Skin:    General: Skin is warm and dry.     Capillary Refill: Capillary refill takes less than 2 seconds.  Neurological:     General: No focal deficit present.     Mental Status: She is alert and oriented to person, place, and time.  Psychiatric:        Mood and Affect: Mood normal.    Ortho Exam  Imaging: No results found.  Past Medical/Family/Surgical/Social History: Medications & Allergies reviewed per EMR, new medications updated. 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   Panic attacks 06/16/2019   Situational anxiety 06/16/2019   Great toe pain, left 06/16/2019   History of gestational diabetes 12/02/2018   Prediabetes 12/02/2018   Mild persistent asthma without complication 95/74/7340   Acute nasopharyngitis 12/11/2016   Intractable chronic migraine without aura and without status migrainosus 06/23/2015   Migraines 03/23/2015   Right-sided low back pain without sciatica 03/23/2015   Muscle cramps 03/23/2015   Dysmenorrhea 04/08/2013   Endometriosis 04/08/2013   Past Medical History:  Diagnosis  Date   Allergy    Anemia    takes iron supplement   Anxiety    Asthma    Asthma    prn inhaler   Blood transfusion 2007   Blood transfusion 2010   Blood transfusion without reported diagnosis    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   H/O: hysterectomy 10/2019   Hemorrhage in uterus 01/20/2009   Hypertension    Migraines    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   Pre-diabetes    Recurrent upper respiratory infection (URI)    Seasonal allergies    Sickle cell anemia (HCC)    trait   Sickle cell trait ([place])    Family History  Problem Relation Age of Onset   Fibromyalgia Mother    Multiple sclerosis Mother    Asthma Father    Healthy Maternal Grandmother    Healthy Maternal Grandfather    Healthy Paternal Grandmother    Healthy Paternal Grandfather    Endometriosis Sister    Healthy Brother    Healthy Child    Past Surgical History:  Procedure Laterality Date   CESAREAN SECTION  09-28-05   CESAREAN SECTION  01-20-09   CESAREAN SECTION     ENDOMETRIAL ABLATION  10/2011   HEROPTIOIN   KNEE ARTHROSCOPY Right 02/16/2014   Procedure: RIGHT ARTHROSCOPY KNEE;  Surgeon: Kerin Salen, MD;  Location: [place];  Service: Orthopedics;  Laterality: Right;   POLYP REMOVAL  5-08   ?CERVICAL OR UTERINE POLYP   ROBOTIC ASSISTED LAPAROSCOPIC HYSTERECTOMY AND SALPINGECTOMY Bilateral 12/01/2019   Procedure: XI ROBOTIC ASSISTEDTOTAL TOTAL LAPAROSCOPIC HYSTERECTOMY AND RIGHT SALPINGECTOMY , EXTENSIVE LYSIS OF ADHESIONS;  Surgeon: [name], MD;  Location: [place];  Service: Gynecology;  Laterality: Bilateral;  request 8:30am OR time. Requests 2 hours   TUBAL LIGATION  01-20-09   DONE WITH C-SECTION   WISDOM TOOTH EXTRACTION Right 2014   Social History   Occupational History   Occupation: [employer]  Tobacco Use   Smoking status: Never   Smokeless tobacco: Never  Vaping Use   Vaping Use: Never used  Substance and Sexual Activity    Alcohol use: Yes    Alcohol/week: 0.0 standard drinks    Comment: seldom   Drug use: No   Sexual activity: Yes    Birth control/protection: Surgical    Comment: TUBAL LIGATION, Hyst, INTERCOURSE AGE 26, SEXUAL PARTNERS 5

## 2021-03-06 NOTE — Progress Notes (Signed)
Pt state lower back pain that travels down her right leg and foot. Pt state she can feel tingling and numbness in a leg. Pt state walking, standing laying down makes the pain. Pt state she takes over the counter pain to help ease her pain.  Numeric Pain Rating Scale and Functional Assessment Average Pain 9 Pain Right Now 5 My pain is constant, sharp, dull, tingling, and aching Pain is worse with: walking, standing, some activites, and laying down Pain improves with: medication   In the last MONTH (on 0-10 scale) has pain interfered with the following?  1. General activity like being  able to carry out your everyday physical activities such as walking, climbing stairs, carrying groceries, or moving a chair?  Rating(7)  2. Relation with others like being able to carry out your usual social activities and roles such as  activities at home, at work and in your community. Rating(8)  3. Enjoyment of life such that you have  been bothered by emotional problems such as feeling anxious, depressed or irritable?  Rating(9)

## 2021-03-07 ENCOUNTER — Other Ambulatory Visit (HOSPITAL_COMMUNITY): Payer: Self-pay

## 2021-03-07 ENCOUNTER — Other Ambulatory Visit: Payer: Self-pay | Admitting: Physical Medicine and Rehabilitation

## 2021-03-07 MED ORDER — TRAMADOL HCL 50 MG PO TABS
50.0000 mg | ORAL_TABLET | Freq: Three times a day (TID) | ORAL | 0 refills | Status: DC | PRN
  Filled 2021-03-07: qty 15, 5d supply, fill #0

## 2021-03-20 ENCOUNTER — Other Ambulatory Visit: Payer: Self-pay

## 2021-03-20 ENCOUNTER — Other Ambulatory Visit (HOSPITAL_COMMUNITY): Payer: Self-pay

## 2021-03-20 ENCOUNTER — Other Ambulatory Visit: Payer: Self-pay | Admitting: Physical Medicine and Rehabilitation

## 2021-03-20 MED FILL — Tramadol HCl Tab 50 MG: ORAL | 5 days supply | Qty: 15 | Fill #0 | Status: AC

## 2021-03-20 NOTE — Telephone Encounter (Signed)
Please advise 

## 2021-03-23 ENCOUNTER — Other Ambulatory Visit (HOSPITAL_COMMUNITY): Payer: Self-pay

## 2021-03-23 ENCOUNTER — Other Ambulatory Visit: Payer: Self-pay

## 2021-03-27 ENCOUNTER — Other Ambulatory Visit (HOSPITAL_COMMUNITY): Payer: Self-pay

## 2021-03-27 ENCOUNTER — Other Ambulatory Visit: Payer: Self-pay | Admitting: Physical Medicine and Rehabilitation

## 2021-03-27 NOTE — Telephone Encounter (Signed)
Please advise 

## 2021-04-05 ENCOUNTER — Other Ambulatory Visit: Payer: Self-pay

## 2021-04-05 ENCOUNTER — Encounter: Payer: Self-pay | Admitting: Physical Medicine and Rehabilitation

## 2021-04-05 ENCOUNTER — Ambulatory Visit: Payer: Self-pay

## 2021-04-05 ENCOUNTER — Ambulatory Visit (INDEPENDENT_AMBULATORY_CARE_PROVIDER_SITE_OTHER): Payer: Managed Care, Other (non HMO) | Admitting: Physical Medicine and Rehabilitation

## 2021-04-05 VITALS — BP 125/80 | HR 101

## 2021-04-05 DIAGNOSIS — M5416 Radiculopathy, lumbar region: Secondary | ICD-10-CM | POA: Diagnosis not present

## 2021-04-05 NOTE — Patient Instructions (Signed)

## 2021-04-05 NOTE — Progress Notes (Signed)
Pt state lower back pain that travels down her right leg and foot. Pt state she can feel tingling and numbness in a leg. Pt state walking, standing laying down makes the pain. Pt state she takes over the counter pain to help ease her pain.  Numeric Pain Rating Scale and Functional Assessment Average Pain 5   In the last MONTH (on 0-10 scale) has pain interfered with the following?  1. General activity like being  able to carry out your everyday physical activities such as walking, climbing stairs, carrying groceries, or moving a chair?  Rating(10)   +Driver, -BT, -Dye Allergies.

## 2021-04-12 ENCOUNTER — Other Ambulatory Visit (HOSPITAL_COMMUNITY): Payer: Self-pay

## 2021-04-12 ENCOUNTER — Encounter: Payer: Self-pay | Admitting: Physical Medicine and Rehabilitation

## 2021-04-12 DIAGNOSIS — M5416 Radiculopathy, lumbar region: Secondary | ICD-10-CM

## 2021-04-12 DIAGNOSIS — M48061 Spinal stenosis, lumbar region without neurogenic claudication: Secondary | ICD-10-CM

## 2021-04-12 DIAGNOSIS — M545 Low back pain, unspecified: Secondary | ICD-10-CM

## 2021-04-12 MED ORDER — TRAMADOL HCL 50 MG PO TABS
50.0000 mg | ORAL_TABLET | Freq: Three times a day (TID) | ORAL | 0 refills | Status: DC | PRN
  Filled 2021-04-12: qty 15, 5d supply, fill #0

## 2021-04-13 ENCOUNTER — Other Ambulatory Visit (HOSPITAL_COMMUNITY): Payer: Self-pay

## 2021-04-22 NOTE — Progress Notes (Signed)
Penny Thomas - 35 y.o. female MRN [id number]  Date of birth: 1985/07/28  Office Visit Note: Visit Date: 04/05/2021 PCP: Girtha Rm, PA-C Referred by: Girtha Rm, PA-C  Subjective: Chief Complaint  Patient presents with   Lower Back - Pain   Right Leg - Pain   Right Foot - Pain   HPI:  Penny Thomas is a 35 y.o. female who comes in today at the request of Barnet Pall, FNP for planned Right L5-S1 Lumbar Interlaminar epidural steroid injection with fluoroscopic guidance.  The patient has failed conservative care including home exercise, medications, time and activity modification.  This injection will be diagnostic and hopefully therapeutic.  Please see requesting physician notes for further details and justification.  ROS Otherwise per HPI.  Assessment & Plan: Visit Diagnoses:    ICD-10-CM   1. Lumbar radiculopathy  M54.16 XR C-ARM NO REPORT      Plan: No additional findings.   Meds & Orders: No orders of the defined types were placed in this encounter.   Orders Placed This Encounter  Procedures   XR C-ARM NO REPORT    Follow-up: Return if symptoms worsen or fail to improve.   Procedures: Lumbar Epidural Steroid Injection - Interlaminar Approach with Fluoroscopic Guidance  Patient: Penny Thomas      Date of Birth: 05-12-1986 MRN: [id number] PCP: Girtha Rm, PA-C      Visit Date: 04/05/2021   Universal Protocol:     Consent Given By: the patient  Position: PRONE  Additional Comments: Vital signs were monitored before and after the procedure. Patient was prepped and draped in the usual sterile fashion. The correct patient, procedure, and site was verified.   Injection Procedure Details:   Procedure diagnoses: Lumbar radiculopathy [M54.16]   Meds Administered: 12 mg Celestone  Laterality: Right  Location/Site:  L5-S1  Needle: 4.5 in., 20 ga. Tuohy  Needle Placement: Paramedian epidural  Findings:    -Comments: Excellent flow of contrast into the epidural space.  Procedure Details: Using a paramedian approach from the side mentioned above, the region overlying the inferior lamina was localized under fluoroscopic visualization and the soft tissues overlying this structure were infiltrated with 4 ml. of 1% Lidocaine without Epinephrine. The Tuohy needle was inserted into the epidural space using a paramedian approach.   The epidural space was localized using loss of resistance along with counter oblique bi-planar fluoroscopic views.  After negative aspirate for air, blood, and CSF, a 2 ml. volume of Isovue-250 was injected into the epidural space and the flow of contrast was observed. Radiographs were obtained for documentation purposes.    The injectate was administered into the level noted above.   Additional Comments:  No complications occurred Dressing: 2 x 2 sterile gauze and Band-Aid    Post-procedure details: Patient was observed during the procedure. Post-procedure instructions were reviewed.  Patient left the clinic in stable condition.     Clinical History: MRI LUMBAR SPINE WITHOUT CONTRAST   TECHNIQUE: Multiplanar, multisequence MR imaging of the lumbar spine was performed. No intravenous contrast was administered.   COMPARISON:  Prior radiograph from 12/21/2018.   FINDINGS: Segmentation: Standard. Lowest well-formed disc space labeled the L5-S1 level.   Alignment: Physiologic with preservation of the normal lumbar lordosis. No listhesis.   Vertebrae: Vertebral body height maintained without evidence for acute or chronic fracture. Bone marrow signal intensity within normal limits. No discrete or worrisome osseous lesions. No abnormal marrow edema.   Conus medullaris  and cauda equina: Conus extends to the L1 level. Conus medullaris within normal limits. Incidental note made of a small fatty filum terminale. Nerve roots of the cauda equina otherwise  unremarkable.   Paraspinal and other soft tissues: Paraspinous soft tissues within normal limits. Visualized visceral structures are normal.   Disc levels:   L1-2:  Unremarkable.   L2-3:  Unremarkable.   L3-4: Normal interspace. Mild bilateral facet hypertrophy. No stenosis or impingement.   L4-5: Normal interspace. Mild bilateral facet hypertrophy. No stenosis or impingement.   L5-S1: Disc desiccation. Superimposed shallow central disc protrusion with associated annular fissure. Protruding disc closely approximates both of the descending S1 nerve roots as they course through the lateral recesses without frank impingement or displacement. No significant spinal stenosis. Foramina remain patent.   IMPRESSION: 1. Shallow central disc protrusion at L5-S1, closely approximating and potentially irritating either of the descending S1 nerve roots. No frank impingement. 2. Otherwise essentially normal MRI of the lumbar spine.     Electronically Signed   By: Jeannine Boga M.D.   On: 05/24/2019 23:19     Objective:  VS:  HT:    WT:   BMI:     BP:125/80  HR:(!) 101bpm  TEMP: ( )  RESP:  Physical Exam Vitals and nursing note reviewed.  Constitutional:      General: She is not in acute distress.    Appearance: Normal appearance. She is obese. She is not ill-appearing.  HENT:     Head: Normocephalic and atraumatic.     Right Ear: External ear normal.     Left Ear: External ear normal.  Eyes:     Extraocular Movements: Extraocular movements intact.  Cardiovascular:     Rate and Rhythm: Normal rate.     Pulses: Normal pulses.  Pulmonary:     Effort: Pulmonary effort is normal. No respiratory distress.  Abdominal:     General: There is no distension.     Palpations: Abdomen is soft.  Musculoskeletal:        General: Tenderness present.     Cervical back: Neck supple.     Right lower leg: No edema.     Left lower leg: No edema.     Comments: Patient has good  distal strength with no pain over the greater trochanters.  No clonus or focal weakness.  Skin:    Findings: No erythema, lesion or rash.  Neurological:     General: No focal deficit present.     Mental Status: She is alert and oriented to person, place, and time.     Sensory: No sensory deficit.     Motor: No weakness or abnormal muscle tone.     Coordination: Coordination normal.  Psychiatric:        Mood and Affect: Mood normal.        Behavior: Behavior normal.     Imaging: No results found.

## 2021-04-26 ENCOUNTER — Other Ambulatory Visit: Payer: Self-pay

## 2021-04-26 ENCOUNTER — Other Ambulatory Visit (INDEPENDENT_AMBULATORY_CARE_PROVIDER_SITE_OTHER): Payer: Managed Care, Other (non HMO)

## 2021-04-26 ENCOUNTER — Encounter: Payer: Self-pay | Admitting: Family Medicine

## 2021-04-26 ENCOUNTER — Other Ambulatory Visit: Payer: Self-pay | Admitting: *Deleted

## 2021-04-26 ENCOUNTER — Telehealth: Payer: Managed Care, Other (non HMO) | Admitting: Family Medicine

## 2021-04-26 ENCOUNTER — Encounter: Payer: Self-pay | Admitting: Physician Assistant

## 2021-04-26 VITALS — Temp 98.8°F | Ht 66.0 in | Wt 156.0 lb

## 2021-04-26 DIAGNOSIS — R0981 Nasal congestion: Secondary | ICD-10-CM

## 2021-04-26 DIAGNOSIS — R6889 Other general symptoms and signs: Secondary | ICD-10-CM | POA: Diagnosis not present

## 2021-04-26 DIAGNOSIS — R52 Pain, unspecified: Secondary | ICD-10-CM

## 2021-04-26 DIAGNOSIS — R059 Cough, unspecified: Secondary | ICD-10-CM

## 2021-04-26 DIAGNOSIS — J4520 Mild intermittent asthma, uncomplicated: Secondary | ICD-10-CM | POA: Diagnosis not present

## 2021-04-26 NOTE — Progress Notes (Signed)
Start time: 12:11 End time: 12:40  Virtual Visit via Video Note  I connected with Penny Thomas on 04/26/21 by a video enabled telemedicine application and verified that I am speaking with the correct person using two identifiers.  Location: Patient: home Provider: office   I discussed the limitations of evaluation and management by telemedicine and the availability of in person appointments. The patient expressed understanding and agreed to proceed.  History of Present Illness:  Chief Complaint  Patient presents with   Nasal Congestion    VIRTUAL stuffy/runny nose and when she blows her nose the mucus is green. She has a cough that sounds more like a "bark" and she is coughing up thick brown mucus. Symptoms started Monday and she did a covid test Monday that was negative. Started losing voice yesterday. Has body aches and fatigue.    Monday 11/28 she started with body aches. Took naproxen to get to sleep. She worked 11/29, that night she started coughing, nasal congestion, runny nose, decreased appetite. Took home COVID test o nthe 28th, hasn't repeated it yet. She has more tests at home that she can take. Taking aleve and delsym (took it last night, didn't help), and sudafed. Hasn't had much improvement. Denies sinus pain. One nostril is running, the other is congested, discolored, and flips back and forth. Cough is a little dry/hacky, but also gets up some phlegm, dark green/brown. Saw some blood from the nose on Monday, none since. Currently has HA at her temples, not in sinuses.  No known sick contacts. Works for [employer] had flu shot. Hasn't had any COVID vaccines.  She had COVID in 01/2021, treated with Paxlovid. She had monoclonal antibodies with her first bout with COVID the year prior.  Pt has asthma, no flares in the last 2-3 years.  Has an inhaler at home, if needed. Has Alvesco (steroid) inhaler at home, last used when she was sick earlier this  year.  She reports that the perles don't work as well as the liquid, thinks she still has some at home. Still has some liquid cough med from September (unclear what this is--PDMP reviewed, showing tramadol, oxycodone, not seeing syrups of narcotic med).   PMH, PSH, SH reviewed  Outpatient Encounter Medications as of 04/26/2021  Medication Sig Note   cetirizine (ZYRTEC) 10 MG tablet Take 1 tablet (10 mg total) by mouth daily.    Dextromethorphan HBr (DELSYM PO) Take 10 mLs by mouth as needed. 04/26/2021: Last dose was last night   famotidine (PEPCID) 20 MG tablet Take 1 tablet by mouth 1 - 2 times per day for itching    fexofenadine (ALLEGRA) 180 MG tablet TAKE 1 TABLET BY MOUTH ONCE A DAY    FLUoxetine (PROZAC) 20 MG tablet Take 1/2 tablet by mouth once daily for the first week, then increase to 1 tablet daily if tolerated    naproxen (NAPROSYN) 500 MG tablet TAKE 1 TABLET BY MOUTH EVERY 12 HOURS AS NEEDED. 04/26/2021: Last dose yesterday   propranolol ER (INDERAL LA) 80 MG 24 hr capsule TAKE 1 CAPSULE BY MOUTH ONCE A DAY    pseudoephedrine (SUDAFED) 30 MG tablet Take 30 mg by mouth every 4 (four) hours as needed for congestion. 04/26/2021: Took this am   albuterol (VENTOLIN HFA) 108 (90 Base) MCG/ACT inhaler Inhale 4 puffs into the lungs every 4-6 hours as needed (Patient not taking: Reported on 04/26/2021)    ALVESCO 160 MCG/ACT inhaler Inhale 1 puff into the lungs  2 (two) times daily. (Patient not taking: Reported on 04/26/2021)    cyclobenzaprine (FLEXERIL) 10 MG tablet TAKE 1 TABLET (10 MG TOTAL) BY MOUTH 3 (THREE) TIMES DAILY AS NEEDED FOR MUSCLE SPASMS. (Patient not taking: Reported on 04/26/2021) 01/25/2021: prn   Lasmiditan Succinate (REYVOW) 100 MG TABS Take 1 tablet by mouth daily as needed (No more than 1 tab in 24 h). (Patient not taking: Reported on 04/26/2021) 04/26/2021: Uses prn migraines   simethicone (MYLICON) 810 MG chewable tablet Chew 125 mg by mouth in the morning and at bedtime.     SUMAtriptan 6 MG/0.5ML SOAJ INJECT 6 MG INTO THE SKIN AS NEEDED (MAY REPEAT IN ONE HOUR. MAXIMUM 2 INJECTIONS IN 24 HOURS.). (Patient not taking: Reported on 04/26/2021)    traMADol (ULTRAM) 50 MG tablet Take 1 tablet (50 mg total) by mouth every 8 (eight) hours as needed for moderate or severe pain. (Patient not taking: Reported on 04/26/2021) 04/26/2021: Uses prn, not taken since last week   [DISCONTINUED] esomeprazole (NEXIUM) 40 MG capsule TAKE 1 CAPSULE BY MOUTH ONCE A DAY AT 12 NOON    [DISCONTINUED] naproxen (NAPROSYN) 500 MG tablet Take 1 tablet (500 mg total) by mouth every 12 (twelve) hours as needed.    No facility-administered encounter medications on file as of 04/26/2021.   Allergies  Allergen Reactions   [name] [Elagolix] Other (See Comments)    Hallucinations   ROS:  No n/v/d, no chest pain or palpitations. No bleeding, bruising, rash. +body aches.  Some SOB with talking, gets winded walking to the car. See HPI  Observations/Objective:  Temp 98.8 F (37.1 C) (Oral)   Ht 5\' 6"  (1.676 m)   Wt 156 lb (70.8 kg)   LMP 11/29/2015   BMI 25.18 kg/m   Ill-appearing female, laying in bed. She has some dry, hacky/sounding cough during the visit. She is alert, oriented. She is speaking comfortably, in no distress Exam is limited due to virtual nature of the visit.  Pt took another home COVID test after visit and reported negative results. Negative flu tests in clinic.    Assessment and Plan:  Flu-like symptoms - either flu (with false negative test), or flu-like illness. Reviewed supportive measures.  >72 hrs of sx, so not candidate for Tamiflu  Mild intermittent asthma without complication - advised to restart daily steroid and use albuterol prn.  If ongoing wheezing/dyspnea, can contact us for oral steroids.  Work note--RTW Monday To contact us if unable to return (and let us know detailed update of symptoms). Uses [facility] outpatient pharmacy.   Do a home COVID test.   If negative, send [place] a note and be here at 1:30 and we will do your tests for flu. If it is positive, let us know that, don't come here, and we will treat you with another oral course of antiviral medication. Restart steroid inhaler. Use the albuterol as needed--use it prior to activities that are making you short of breath, and as needed. If your breathing and shortness of breath isn't improving, let us know and we can send in a steroid course (pills). I would get plain guaifenesin (expectorant, that helps loosen mucus and phlegm). I recommend getting Mucinex 12 hour, which is a large pill that you take twice daily.  You can get other forms of mucinex that might be smaller or shorter-acting--just make sure that you don't get one with other ingredients that will duplicate the dextromethorphan that is in your delsym or the phenylephrine that is  your sudafed..    Follow Up Instructions:    I discussed the assessment and treatment plan with the patient. The patient was provided an opportunity to ask questions and all were answered. The patient agreed with the plan and demonstrated an understanding of the instructions.   The patient was advised to call back or seek an in-person evaluation if the symptoms worsen or if the condition fails to improve as anticipated.  I spent 27 minutes dedicated to the care of this patient, including pre-visit review of records, face to face time, post-visit ordering of testing and documentation.    Vikki Ports, MD

## 2021-04-26 NOTE — Patient Instructions (Signed)
  Do a home COVID test.  If negative, send [place] a note and be here at 1:30 and we will do your tests for flu. If you want the COVID repeated, let us know (it is on the same swab).  If  your COVID test is positive, let us know that, don't come here, and we will treat you with another oral course of antiviral medication.  Restart steroid inhaler. Use the albuterol as needed--use it prior to activities that are making you short of breath, and as needed. If your breathing and shortness of breath isn't improving, let us know and we can send in a steroid course (pills). I would get plain guaifenesin (expectorant, that helps loosen mucus and phlegm). I recommend getting Mucinex 12 hour, which is a large pill that you take twice daily.  You can get other forms of mucinex that might be smaller or shorter-acting--just make sure that you don't get one with other ingredients that will duplicate the dextromethorphan that is in your delsym or the phenylephrine that is your sudafed.  Contact us if you have persistent fever, discolored mucus or phlegm, worsening shortness of breath, pain with breathing, or other new/concerning symptoms.

## 2021-04-27 ENCOUNTER — Encounter: Payer: Self-pay | Admitting: Family Medicine

## 2021-04-27 LAB — POCT INFLUENZA A/B
Influenza A, POC: NEGATIVE
Influenza B, POC: NEGATIVE

## 2021-04-29 MED ORDER — AZITHROMYCIN 250 MG PO TABS: ORAL_TABLET | ORAL | 0 refills | Status: DC

## 2021-05-01 ENCOUNTER — Encounter: Payer: Self-pay | Admitting: Family Medicine

## 2021-05-02 ENCOUNTER — Other Ambulatory Visit (HOSPITAL_COMMUNITY): Payer: Self-pay

## 2021-05-02 ENCOUNTER — Other Ambulatory Visit: Payer: Self-pay | Admitting: *Deleted

## 2021-05-02 MED ORDER — BENZONATATE 200 MG PO CAPS
200.0000 mg | ORAL_CAPSULE | Freq: Three times a day (TID) | ORAL | 0 refills | Status: DC | PRN
Start: 2021-05-02 — End: 2021-06-13
  Filled 2021-05-02 – 2021-05-14 (×2): qty 21, 7d supply, fill #0

## 2021-05-11 ENCOUNTER — Other Ambulatory Visit (HOSPITAL_COMMUNITY): Payer: Self-pay

## 2021-05-14 ENCOUNTER — Other Ambulatory Visit (HOSPITAL_COMMUNITY): Payer: Self-pay

## 2021-05-17 ENCOUNTER — Other Ambulatory Visit (HOSPITAL_COMMUNITY): Payer: Self-pay

## 2021-05-17 MED ORDER — TRAMADOL HCL 50 MG PO TABS
50.0000 mg | ORAL_TABLET | Freq: Three times a day (TID) | ORAL | 0 refills | Status: DC | PRN
  Filled 2021-05-17: qty 15, 5d supply, fill #0

## 2021-05-17 NOTE — Addendum Note (Signed)
Addended by: Raymondo Band on: 05/17/2021 02:33 PM   Modules accepted: Orders

## 2021-05-21 NOTE — Progress Notes (Addendum)
NEUROLOGY FOLLOW UP OFFICE NOTE  Penny Thomas [id number]  Assessment/Plan:   1  Migraine with and without aura, without status migrainosus, not intractable 2  Likely primary stabbing headache but as this is a new headache, will check for secondary intracranial etiologies.  MRI and MRA of brain For stabbing headache, consider starting melatonin 3 to 10mg  at bedtime Migraine prevention:  propranolol ER 80mg  daily.  As weather is supposed to warm up later this week, would defer increasing however this is an option if needed. Migraine rescue:  first line - naproxen; second line - sumatriptan Penny Thomas or Reyvow Limit use of pain relievers to no more than 2 days out of week to prevent risk of rebound or medication-overuse headache. Keep headache diary Follow up one year or as needed   Subjective:  Penny Thomas is a 35 year old right-handed woman who follows up for migraines.   UPDATE: Colder weather makes it worse.  She reports 2-3 migraines a week in December.  Two migraines were aborted quickly with naproxen as first line.  Has not needed to take the Reyvow or sumatriptan.    She started getting a new headache in November.  It is a severe stabbing headache in the left temple.  Left eye gets watery but no conjunctival injection or ptosis.  No visual disturbance, nausea, photophobia or phonophobia.  No other symptoms.  Lasts 5 to 10 minutes.  She has had about 3 episodes.     Rescue protocol:  First line, naproxen 500mg  (at work), will take sumatriptan Penny Thomas or Reyvow at home. Current NSAID:  Naproxen 500mg  Current analgesics:  tramadol 50mg  Current triptans:  Sumatriptan Penny Thomas (have not used them) Current ergotamine:  none Other abortive therapy:  Reyvow Current anti-emetic:  none Current muscle relaxants: none Current anti-anxiolytic:  none Current sleep aide:  none Current Antihypertensive medications:  Propranolol ER 80mg  Current Antidepressant medications:  sertraline  50mg  Current Anticonvulsant medications:  none Current anti-CGRP:  none Current Vitamins/Herbal/Supplements:  none Current Antihistamines/Decongestants:  none Other therapy:  Reyvow Hormone/birth control:  [name] Other medication:  none   Caffeine:  no Alcohol:  seldom Smoker:  no Diet:  hydrates Exercise:  no Depression:  no; Anxiety:  no Other pain:  no Sleep hygiene:  okay   HISTORY: Onset:  Age 39. Location:  Varies (bi-frontal, back of head) Quality:  Squeezing/"feels like I am hit in the head with a hammer" Initial Intensity:  9-10/10 Aura:  blurred vision Prodrome:  no Associated symptoms: Nausea, dizziness, photophobia, osmophobia Initial Duration:  1 to 3 days initiail Frequency:  3 to 4 days per week (16 headache days per week) Triggers:  Menstrual cycle, caffeine, scents (perfume, deoderant, hand sanitizer), change in weather Relieving factors: Laying down Activity:  Lays down if severe   Past NSAIDS:  Mobic, ibuprofen 800mg , Cambia Past analgesics:  Hydrocodone-acetaminophen, tramadol 50mg , Roxicet, Excedrin Migraine Past abortive triptans:  sumatriptan 100mg , Maxalt 10mg , Relpax 20mg , sumatriptan Penny Thomas Past ergot:  none Past muscle relaxants:  Flexeril, baclofen, tizanidine 2mg , Robaxin Past anti-nausea:  Zofran 4mg , promethazine 25mg  Past antihypertensive medications:  no Past antidepressant medications:  amitriptyline 50mg  (weight gain) Past anticonvulsant medications:  topiramate 150mg  (effective but caused hair loss) Past vitamins/Herbal/Supplements:  no Past antihistamines/decongestants:  no Other past medications:  no   Family history of headache:  Mom, sister, grandmother  PAST MEDICAL HISTORY: Past Medical History:  Diagnosis Date   Allergy    Anemia    takes iron supplement  Anxiety    Asthma    Asthma    prn inhaler   Blood transfusion 2007   Blood transfusion 2010   Blood transfusion without reported diagnosis    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   H/O: hysterectomy 10/2019   Hemorrhage in uterus 01/20/2009   Hypertension    Migraines    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 (Patient not taking: Reported on 04/26/2021) 6.1 g 5   azithromycin (ZITHROMAX) 250 MG tablet Take 2 tablets by mouth on first day, then 1 tablet by mouth on days 2 through 5 6 tablet 0   benzonatate (TESSALON) 200 MG capsule Take 1 capsule by mouth 3 times daily as needed for cough. 21 capsule 0   cetirizine (ZYRTEC) 10 MG tablet Take 1 tablet (10 mg total) by mouth daily. 30 tablet 5   cyclobenzaprine (FLEXERIL) 10 MG tablet TAKE 1 TABLET (10 MG TOTAL) BY MOUTH 3 (THREE) TIMES DAILY AS NEEDED FOR MUSCLE SPASMS. (Patient not taking: Reported on 04/26/2021) 90 tablet 1   Dextromethorphan HBr (DELSYM PO) Take 10 mLs by mouth as needed.     famotidine (PEPCID) 20 MG tablet Take 1 tablet by mouth 1 - 2 times per day for itching 60 tablet 5   fexofenadine (ALLEGRA) 180 MG tablet TAKE 1 TABLET BY MOUTH ONCE A DAY 7 tablet 0   FLUoxetine (PROZAC) 20 MG tablet Take 1/2 tablet by mouth once daily for the first week, then increase to 1 tablet daily if tolerated 30 tablet 5   Lasmiditan Succinate (REYVOW) 100 MG TABS Take 1 tablet by mouth daily as needed (No more than 1 tab in 24 h). (Patient not taking: Reported on 04/26/2021) 30 tablet 3   naproxen (NAPROSYN) 500 MG tablet TAKE 1 TABLET BY MOUTH EVERY 12 HOURS AS NEEDED. 60 tablet 1   propranolol ER (INDERAL LA) 80  MG 24 hr capsule TAKE 1 CAPSULE BY MOUTH ONCE A DAY 30 capsule 5   pseudoephedrine (SUDAFED) 30 MG tablet Take 30 mg by mouth every 4 (four) hours as needed for congestion.     simethicone (MYLICON) 010 MG chewable tablet Chew 125 mg by mouth in the morning and at bedtime.     SUMAtriptan 6 MG/0.5ML SOAJ INJECT 6 MG INTO THE SKIN AS NEEDED (MAY REPEAT IN ONE HOUR. MAXIMUM 2 INJECTIONS IN 24 HOURS.). (Patient not taking: Reported on 04/26/2021) 3 mL 5   traMADol (ULTRAM) 50 MG tablet Take 1 tablet by mouth every 8 hours as needed for moderate or severe pain. 15 tablet 0   [DISCONTINUED] esomeprazole (NEXIUM) 40 MG capsule TAKE 1 CAPSULE BY MOUTH ONCE A DAY AT 12 NOON 30 capsule 1   No current facility-administered medications on file prior to visit.    ALLERGIES: Allergies  Allergen Reactions   [name] [Elagolix] Other (See Comments)    Hallucinations    FAMILY HISTORY: Family History  Problem Relation  Age of Onset   Fibromyalgia Mother    Multiple sclerosis Mother    Asthma Father    Healthy Maternal Grandmother    Healthy Maternal Grandfather    Healthy Paternal Grandmother    Healthy Paternal Grandfather    Endometriosis Sister    Healthy Brother    Healthy Child       Objective:  Blood pressure 108/76, pulse 83, height 5\' 7"  (1.702 m), weight 190 lb 6.4 oz (86.4 kg), last menstrual period 11/29/2015, SpO2 100 %. General: No acute distress.  Patient appears well-groomed.   Head:  Normocephalic/atraumatic Eyes:  Fundi examined but not visualized Neck: supple, no paraspinal tenderness, full range of motion Heart:  Regular rate and rhythm Lungs:  Clear to auscultation bilaterally Back: No paraspinal tenderness Neurological Exam: alert and oriented to person, place, and time.  Speech fluent and not dysarthric, language intact.  CN II-XII intact. Bulk and tone normal, muscle strength 5/5 throughout.  Sensation to light touch intact.  Deep tendon reflexes 2+ throughout, toes  downgoing.  Finger to nose testing intact.  Gait normal, Romberg negative.   Metta Clines, DO  CC: Harland Dingwall, PA-C

## 2021-05-22 ENCOUNTER — Other Ambulatory Visit: Payer: Self-pay

## 2021-05-22 ENCOUNTER — Encounter: Payer: Self-pay | Admitting: Neurology

## 2021-05-22 ENCOUNTER — Ambulatory Visit: Payer: Managed Care, Other (non HMO) | Admitting: Neurology

## 2021-05-22 VITALS — BP 108/76 | HR 83 | Ht 67.0 in | Wt 190.4 lb

## 2021-05-22 DIAGNOSIS — G43009 Migraine without aura, not intractable, without status migrainosus: Secondary | ICD-10-CM

## 2021-05-22 DIAGNOSIS — G43109 Migraine with aura, not intractable, without status migrainosus: Secondary | ICD-10-CM

## 2021-05-22 DIAGNOSIS — G4485 Primary stabbing headache: Secondary | ICD-10-CM | POA: Diagnosis not present

## 2021-05-22 NOTE — Patient Instructions (Signed)
For the ice-pick headache, consider melatonin 3 to 10mg  at bedtime Check MRI/MRA of head Continue propranolol and use naproxen, Reyvow or sumatriptan as needed

## 2021-06-05 ENCOUNTER — Encounter: Payer: Self-pay | Admitting: Neurology

## 2021-06-13 ENCOUNTER — Other Ambulatory Visit: Payer: Self-pay

## 2021-06-13 ENCOUNTER — Ambulatory Visit: Payer: Managed Care, Other (non HMO) | Admitting: Physical Medicine and Rehabilitation

## 2021-06-13 ENCOUNTER — Encounter: Payer: Self-pay | Admitting: Physical Medicine and Rehabilitation

## 2021-06-13 ENCOUNTER — Ambulatory Visit: Payer: Self-pay

## 2021-06-13 VITALS — BP 126/84 | HR 90

## 2021-06-13 DIAGNOSIS — M5416 Radiculopathy, lumbar region: Secondary | ICD-10-CM | POA: Diagnosis not present

## 2021-06-13 MED ORDER — METHYLPREDNISOLONE ACETATE 80 MG/ML IJ SUSP
80.0000 mg | Freq: Once | INTRAMUSCULAR | Status: AC
  Administered 2021-06-13: 80 mg

## 2021-06-13 NOTE — Patient Instructions (Signed)

## 2021-06-13 NOTE — Procedures (Signed)
S1 Lumbosacral Transforaminal Epidural Steroid Injection - Sub-Pedicular Approach with Fluoroscopic Guidance   Patient: Penny Thomas      Date of Birth: 03-15-1986 MRN: [id number] PCP: Girtha Rm, PA-C      Visit Date: 06/13/2021   Universal Protocol:    Date/Time: 01/18/234:08 PM  Consent Given By: the patient  Position:  PRONE  Additional Comments: Vital signs were monitored before and after the procedure. Patient was prepped and draped in the usual sterile fashion. The correct patient, procedure, and site was verified.   Injection Procedure Details:  Procedure Site One Meds Administered:  Meds ordered this encounter  Medications   methylPREDNISolone acetate (DEPO-MEDROL) injection 80 mg    Laterality: Right  Location/Site:  S1 Foramen   Needle size: 22 ga.  Needle type: Spinal  Needle Placement: Transforaminal  Findings:   -Comments: Excellent flow of contrast along the nerve, nerve root and into the epidural space.  Epidurogram: Contrast epidurogram showed no nerve root cut off or restricted flow pattern.  Procedure Details: After squaring off the sacral end-plate to get a true AP view, the C-arm was positioned so that the best possible view of the S1 foramen was visualized. The soft tissues overlying this structure were infiltrated with 2-3 ml. of 1% Lidocaine without Epinephrine.    The spinal needle was inserted toward the target using a "trajectory" view along the fluoroscope beam.  Under AP and lateral visualization, the needle was advanced so it did not puncture dura. Biplanar projections were used to confirm position. Aspiration was confirmed to be negative for CSF and/or blood. A 1-2 ml. volume of Isovue-250 was injected and flow of contrast was noted at each level. Radiographs were obtained for documentation purposes.   After attaining the desired flow of contrast documented above, a 0.5 to 1.0 ml test dose of 0.25% Marcaine was injected  into each respective transforaminal space.  The patient was observed for 90 seconds post injection.  After no sensory deficits were reported, and normal lower extremity motor function was noted,   the above injectate was administered so that equal amounts of the injectate were placed at each foramen (level) into the transforaminal epidural space.   Additional Comments:  The patient tolerated the procedure well Dressing: Band-Aid with 2 x 2 sterile gauze    Post-procedure details: Patient was observed during the procedure. Post-procedure instructions were reviewed.  Patient left the clinic in stable condition.

## 2021-06-13 NOTE — Progress Notes (Signed)
Penny Thomas - 36 y.o. female MRN [id number]  Date of birth: 1985/10/04  Office Visit Note: Visit Date: 06/13/2021 PCP: Girtha Rm, PA-C Referred by: Magnus Sinning, MD  Subjective: Chief Complaint  Patient presents with   Lower Back - Pain   Right Leg - Pain, Numbness, Tingling   Right Foot - Pain, Numbness, Tingling   HPI:  Penny Thomas is a 36 y.o. female who comes in today at the request of Barnet Pall, FNP for planned Right S1-2 Lumbar Transforaminal epidural steroid injection with fluoroscopic guidance.  The patient has failed conservative care including home exercise, medications, time and activity modification.  This injection will be diagnostic and hopefully therapeutic.  Please see requesting physician notes for further details and justification.  MRI from 2020 does show disc protrusion broadly with right more than left lateral recess narrowing likely affecting the S1 nerve root.  Prior interlaminar injections have helped but have not lasted very long.  We are going to complete a transforaminal approach and see how that does.  If it does not last very long would consider surgical referral from information standpoint what could be done surgically.  Should continue with current medications.  Would consider repeat MRI as well.  ROS Otherwise per HPI.  Assessment & Plan: Visit Diagnoses:    ICD-10-CM   1. Lumbar radiculopathy  M54.16 XR C-ARM NO REPORT    Epidural Steroid injection    methylPREDNISolone acetate (DEPO-MEDROL) injection 80 mg      Plan: No additional findings.   Meds & Orders:  Meds ordered this encounter  Medications   methylPREDNISolone acetate (DEPO-MEDROL) injection 80 mg    Orders Placed This Encounter  Procedures   XR C-ARM NO REPORT   Epidural Steroid injection    Follow-up: Return if symptoms worsen or fail to improve.   Procedures: No procedures performed  S1 Lumbosacral Transforaminal Epidural Steroid  Injection - Sub-Pedicular Approach with Fluoroscopic Guidance   Patient: Penny Thomas      Date of Birth: 12/31/1985 MRN: [id number] PCP: Girtha Rm, PA-C      Visit Date: 06/13/2021   Universal Protocol:    Date/Time: 01/18/234:08 PM  Consent Given By: the patient  Position:  PRONE  Additional Comments: Vital signs were monitored before and after the procedure. Patient was prepped and draped in the usual sterile fashion. The correct patient, procedure, and site was verified.   Injection Procedure Details:  Procedure Site One Meds Administered:  Meds ordered this encounter  Medications   methylPREDNISolone acetate (DEPO-MEDROL) injection 80 mg    Laterality: Right  Location/Site:  S1 Foramen   Needle size: 22 ga.  Needle type: Spinal  Needle Placement: Transforaminal  Findings:   -Comments: Excellent flow of contrast along the nerve, nerve root and into the epidural space.  Epidurogram: Contrast epidurogram showed no nerve root cut off or restricted flow pattern.  Procedure Details: After squaring off the sacral end-plate to get a true AP view, the C-arm was positioned so that the best possible view of the S1 foramen was visualized. The soft tissues overlying this structure were infiltrated with 2-3 ml. of 1% Lidocaine without Epinephrine.    The spinal needle was inserted toward the target using a "trajectory" view along the fluoroscope beam.  Under AP and lateral visualization, the needle was advanced so it did not puncture dura. Biplanar projections were used to confirm position. Aspiration was confirmed to be negative for CSF and/or blood. A 1-2  ml. volume of Isovue-250 was injected and flow of contrast was noted at each level. Radiographs were obtained for documentation purposes.   After attaining the desired flow of contrast documented above, a 0.5 to 1.0 ml test dose of 0.25% Marcaine was injected into each respective transforaminal space.   The patient was observed for 90 seconds post injection.  After no sensory deficits were reported, and normal lower extremity motor function was noted,   the above injectate was administered so that equal amounts of the injectate were placed at each foramen (level) into the transforaminal epidural space.   Additional Comments:  The patient tolerated the procedure well Dressing: Band-Aid with 2 x 2 sterile gauze    Post-procedure details: Patient was observed during the procedure. Post-procedure instructions were reviewed.  Patient left the clinic in stable condition.    Clinical History: MRI LUMBAR SPINE WITHOUT CONTRAST   TECHNIQUE: Multiplanar, multisequence MR imaging of the lumbar spine was performed. No intravenous contrast was administered.   COMPARISON:  Prior radiograph from 12/21/2018.   FINDINGS: Segmentation: Standard. Lowest well-formed disc space labeled the L5-S1 level.   Alignment: Physiologic with preservation of the normal lumbar lordosis. No listhesis.   Vertebrae: Vertebral body height maintained without evidence for acute or chronic fracture. Bone marrow signal intensity within normal limits. No discrete or worrisome osseous lesions. No abnormal marrow edema.   Conus medullaris and cauda equina: Conus extends to the L1 level. Conus medullaris within normal limits. Incidental note made of a small fatty filum terminale. Nerve roots of the cauda equina otherwise unremarkable.   Paraspinal and other soft tissues: Paraspinous soft tissues within normal limits. Visualized visceral structures are normal.   Disc levels:   L1-2:  Unremarkable.   L2-3:  Unremarkable.   L3-4: Normal interspace. Mild bilateral facet hypertrophy. No stenosis or impingement.   L4-5: Normal interspace. Mild bilateral facet hypertrophy. No stenosis or impingement.   L5-S1: Disc desiccation. Superimposed shallow central disc protrusion with associated annular fissure.  Protruding disc closely approximates both of the descending S1 nerve roots as they course through the lateral recesses without frank impingement or displacement. No significant spinal stenosis. Foramina remain patent.   IMPRESSION: 1. Shallow central disc protrusion at L5-S1, closely approximating and potentially irritating either of the descending S1 nerve roots. No frank impingement. 2. Otherwise essentially normal MRI of the lumbar spine.     Electronically Signed   By: Jeannine Boga M.D.   On: 05/24/2019 23:19     Objective:  VS:  HT:     WT:    BMI:      BP:126/84   HR:90bpm   TEMP: ( )   RESP:  Physical Exam Vitals and nursing note reviewed.  Constitutional:      General: She is not in acute distress.    Appearance: Normal appearance. She is obese. She is not ill-appearing.  HENT:     Head: Normocephalic and atraumatic.     Right Ear: External ear normal.     Left Ear: External ear normal.  Eyes:     Extraocular Movements: Extraocular movements intact.  Cardiovascular:     Rate and Rhythm: Normal rate.     Pulses: Normal pulses.  Pulmonary:     Effort: Pulmonary effort is normal. No respiratory distress.  Abdominal:     General: There is no distension.     Palpations: Abdomen is soft.  Musculoskeletal:        General: Tenderness present.  Cervical back: Neck supple.     Right lower leg: No edema.     Left lower leg: No edema.     Comments: Patient has good distal strength with no pain over the greater trochanters.  No clonus or focal weakness.  Skin:    Findings: No erythema, lesion or rash.  Neurological:     General: No focal deficit present.     Mental Status: She is alert and oriented to person, place, and time.     Sensory: No sensory deficit.     Motor: No weakness or abnormal muscle tone.     Coordination: Coordination normal.  Psychiatric:        Mood and Affect: Mood normal.        Behavior: Behavior normal.     Imaging: No results  found.

## 2021-06-13 NOTE — Progress Notes (Signed)
Pt state lower back pain that travels down her right leg and foot. Pt state she can feel tingling and numbness in a leg. Pt state walking, standing laying down makes the pain. Pt state she takes over the counter pain to help ease her pain.  Numeric Pain Rating Scale and Functional Assessment Average Pain 6   In the last MONTH (on 0-10 scale) has pain interfered with the following?  1. General activity like being  able to carry out your everyday physical activities such as walking, climbing stairs, carrying groceries, or moving a chair?  Rating(10)   +Driver, -BT, -Dye Allergies.

## 2021-06-15 ENCOUNTER — Ambulatory Visit
Admission: RE | Admit: 2021-06-15 | Discharge: 2021-06-15 | Disposition: A | Discharge status: Home / Self Care | Payer: Managed Care, Other (non HMO) | Source: Ambulatory Visit | Attending: Neurology | Admitting: Neurology

## 2021-06-15 ENCOUNTER — Other Ambulatory Visit: Payer: Self-pay

## 2021-06-15 DIAGNOSIS — G4485 Primary stabbing headache: Secondary | ICD-10-CM

## 2021-06-17 ENCOUNTER — Encounter: Payer: Self-pay | Admitting: Neurology

## 2021-06-17 ENCOUNTER — Other Ambulatory Visit: Payer: Self-pay | Admitting: Neurology

## 2021-06-18 ENCOUNTER — Other Ambulatory Visit (HOSPITAL_COMMUNITY): Payer: Self-pay

## 2021-06-18 MED ORDER — SUMATRIPTAN SUCCINATE 6 MG/0.5ML ~~LOC~~ SOAJ
SUBCUTANEOUS | 5 refills | Status: DC
  Filled 2021-06-18: qty 3, 30d supply, fill #0

## 2021-06-18 NOTE — Progress Notes (Signed)
Pt advised MRI And MRA results.

## 2021-06-19 ENCOUNTER — Other Ambulatory Visit: Payer: Self-pay

## 2021-06-19 ENCOUNTER — Other Ambulatory Visit: Payer: Self-pay | Admitting: Neurology

## 2021-06-19 ENCOUNTER — Telehealth: Payer: Self-pay

## 2021-06-19 ENCOUNTER — Other Ambulatory Visit (HOSPITAL_COMMUNITY): Payer: Self-pay

## 2021-06-19 MED ORDER — PROPRANOLOL HCL ER 120 MG PO CP24
120.0000 mg | ORAL_CAPSULE | Freq: Every day | ORAL | 5 refills | Status: DC
Start: 2021-06-19 — End: 2021-12-07
  Filled 2021-06-19: qty 30, 30d supply, fill #0
  Filled 2021-07-16: qty 30, 30d supply, fill #1
  Filled 2021-08-14: qty 30, 30d supply, fill #2
  Filled 2021-09-14: qty 30, 30d supply, fill #3
  Filled 2021-10-18: qty 30, 30d supply, fill #4
  Filled 2021-11-16: qty 30, 30d supply, fill #5

## 2021-06-19 NOTE — Telephone Encounter (Signed)
New message   Your information has been submitted and will be reviewed by [place]. You may close this dialog, return to your dashboard, and perform other tasks. An electronic determination will be received in CoverMyMeds within 72-120 hours. You can see the latest determination by locating this request on your dashboard or by reopening this request. You will receive a fax copy of the determination. If Penny Thomas has not responded in 120 hours, contact [employer] at [phone].  Penny Thomas Key: [id number] - PA Case ID: [id number] Need help? Call us at [phone] Status Sent to [facility] InnoPran XL 120MG  er capsules Form [employer] Electronic PA Form [phone] NCPDP)

## 2021-06-19 NOTE — Progress Notes (Signed)
Script correct and signed.

## 2021-06-19 NOTE — Progress Notes (Signed)
Per Dr.Jaffe, send in prescription for propranolol ER 120mg  daily.

## 2021-06-20 ENCOUNTER — Other Ambulatory Visit: Payer: Self-pay | Admitting: Physical Medicine and Rehabilitation

## 2021-06-20 ENCOUNTER — Encounter: Payer: Self-pay | Admitting: Physical Medicine and Rehabilitation

## 2021-06-20 ENCOUNTER — Other Ambulatory Visit (HOSPITAL_COMMUNITY): Payer: Self-pay

## 2021-06-20 DIAGNOSIS — M48061 Spinal stenosis, lumbar region without neurogenic claudication: Secondary | ICD-10-CM

## 2021-06-20 DIAGNOSIS — M5416 Radiculopathy, lumbar region: Secondary | ICD-10-CM

## 2021-06-20 DIAGNOSIS — M5116 Intervertebral disc disorders with radiculopathy, lumbar region: Secondary | ICD-10-CM

## 2021-07-03 NOTE — Telephone Encounter (Signed)
F/u  Penny Thomas Key: [id number] - PA Case ID: [id number] Need help? Call us at [phone] Outcome Deniedtoday CaseId:[id number];Status:Denied;Review Type:;Appeal Information: [place] [id number]; Important - Please read the below note on eAppeals: Please reference the denial letter for information on the rights for an appeal, rationale for the denial, and how to submit an appeal including if any information is needed to support the appeal. Note about urgent situations - Generally, an urgent situation is one which, in the opinion of the provider, the health of the patient may be in serious jeopardy or may experience pain that cannot be adequately controlled while waiting for a decision on the appeal.; Drug InnoPran XL 120MG  er capsules Form [employer] Electronic PA Form [phone] NCPDP)

## 2021-07-03 NOTE — Telephone Encounter (Signed)
F/u  Fax clinical notes to [facility] [phone]

## 2021-07-17 ENCOUNTER — Other Ambulatory Visit (HOSPITAL_COMMUNITY): Payer: Self-pay

## 2021-07-19 ENCOUNTER — Other Ambulatory Visit (HOSPITAL_COMMUNITY): Payer: Self-pay

## 2021-08-01 ENCOUNTER — Other Ambulatory Visit (HOSPITAL_COMMUNITY): Payer: Self-pay

## 2021-08-01 MED ORDER — FLUTICASONE PROPIONATE 50 MCG/ACT NA SUSP
NASAL | 0 refills | Status: DC
  Filled 2021-08-01: qty 16, 30d supply, fill #0

## 2021-08-01 NOTE — Progress Notes (Signed)
?Start time: 9:35 ?End time: 9:55 ? ?Virtual Visit via Video Note ? ?I connected with Penny Thomas on 08/02/21 by a video enabled telemedicine application and verified that I am speaking with the correct person using two identifiers. ? ?Location: ?Patient: home ?Provider: office ?  ?I discussed the limitations of evaluation and management by telemedicine and the availability of in person appointments. The patient expressed understanding and agreed to proceed. ? ?History of Present Illness: ? ?Chief Complaint  ?Patient presents with  ? Cough  ?  VIRTUAL started Sat am. Also has congestion and drainage down the back of her throat as well as she has lost her voice. Has not done any home covid tests.  ? ?Friday night she lost her appetite, didn't really eat anything over the weekend.  Everything made her nauseated. She has only been tolerating apple sauce. ?Saturday she started with congested, runny nose, started coughing.  She had low grade fever 100.7.  Hasn't had any fever since. She had some myalgias on Saturday, resolved by the next day. ?Sunday her cough got worse,and her nasal drainage is now yellow-green.  Phlegm is thick and cloudy. ? ?She has been taking Delsym, robitussin DM and sudafed. ?Started back on Qvar 2 days ago, when she had trouble stopping her coughing. It seemed to help some.  Hasn't taken any albuterol. ?She denies any DOE/shortness of breath. ? ?Oldest son was sick a week prior to onset of symptoms. ?Works--hospice aide. ?Hasn't done any COVID tests. ?She hasn't had COVID vaccines. ? ?PMH, PSH, SH reviewed ? ?Outpatient Encounter Medications as of 08/02/2021  ?Medication Sig Note  ? beclomethasone (QVAR) 80 MCG/ACT inhaler Inhale into the lungs 2 (two) times daily.   ? cetirizine (ZYRTEC) 10 MG tablet Take 1 tablet (10 mg total) by mouth daily.   ? Dextromethorphan HBr (DELSYM PO) Take 10 mLs by mouth as needed. 08/02/2021: Last dose 9pm  ? famotidine (PEPCID) 20 MG tablet Take 1 tablet  by mouth 1 - 2 times per day for itching   ? fexofenadine (ALLEGRA) 180 MG tablet TAKE 1 TABLET BY MOUTH ONCE A DAY   ? FLUoxetine (PROZAC) 20 MG tablet Take 1/2 tablet by mouth once daily for the first week, then increase to 1 tablet daily if tolerated 08/02/2021: Takes 1 daily  ? guaiFENesin (ROBITUSSIN CHEST CONGESTION PO) Take 30 mLs by mouth as needed. 08/02/2021: Last dose 12am   ? phenylephrine (SUDAFED PE) 10 MG TABS tablet Take 10 mg by mouth every 4 (four) hours as needed. 08/02/2021: Last dose 7am  ? propranolol ER (INDERAL LA) 120 MG 24 hr capsule Take 1 capsule (120 mg total) by mouth at bedtime.   ? simethicone (MYLICON) 035 MG chewable tablet Chew 125 mg by mouth in the morning and at bedtime.   ? albuterol (VENTOLIN HFA) 108 (90 Base) MCG/ACT inhaler Inhale 4 puffs into the lungs every 4-6 hours as needed (Patient not taking: Reported on 08/02/2021)   ? ALVESCO 160 MCG/ACT inhaler Inhale 1 puff into the lungs 2 (two) times daily. (Patient not taking: Reported on 08/02/2021)   ? fluticasone (FLONASE) 50 MCG/ACT nasal spray Place 2 sprays in each nostril daily as directed (Patient not taking: Reported on 08/02/2021)   ? Lasmiditan Succinate (REYVOW) 100 MG TABS Take 1 tablet by mouth daily as needed (No more than 1 tab in 24 h). (Patient not taking: Reported on 08/02/2021) 04/26/2021: Uses prn migraines  ? naproxen (NAPROSYN) 500 MG tablet TAKE 1 TABLET  BY MOUTH EVERY 12 HOURS AS NEEDED. (Patient not taking: Reported on 08/02/2021) 04/26/2021: Last dose yesterday  ? SUMAtriptan 6 MG/0.5ML SOAJ INJECT 6 MG INTO THE SKIN AS NEEDED (MAY REPEAT IN ONE HOUR. MAXIMUM 2 INJECTIONS IN 24 HOURS.). (Patient not taking: Reported on 08/02/2021)   ? traMADol (ULTRAM) 50 MG tablet Take 1 tablet by mouth every 8 hours as needed for moderate or severe pain. (Patient not taking: Reported on 08/02/2021)   ? [DISCONTINUED] azithromycin (ZITHROMAX) 250 MG tablet Take 2 tablets by mouth on first day, then 1 tablet by mouth on days 2 through 5    ? [DISCONTINUED] esomeprazole (NEXIUM) 40 MG capsule TAKE 1 CAPSULE BY MOUTH ONCE A DAY AT 12 NOON   ? ?No facility-administered encounter medications on file as of 08/02/2021.  ? ?Allergies  ?Allergen Reactions  ? [name] [Elagolix] Other (See Comments)  ?  Hallucinations  ? ?ROS:  URI symptoms per HPI. Still has decreased appetite.  Intermittent nausea (related to certain smells of foods).  No vomiting or diarrhea. No rashes.  No chest pain, palpitations.  Some soreness in chest from coughing. ?See HPI ?  ?Observations/Objective: ? ?Temp 97.7 ?F (36.5 ?C) (Oral)   Ht '5\' 7"'$  (1.702 m)   Wt 158 lb (71.7 kg)   LMP 11/29/2015   BMI 24.75 kg/m?  ? ?High-pitched voice, some slight hoarseness. ?Some coughing during visit. ?She is alert and oriented. ?She is speaking comfortable, in no distress ?Exam is limited due to virtual nature of the visit. ? ? ?Assessment and Plan: ? ?Symptoms of upper respiratory infection (URI) - supportive measures reviewed.  To do home COVID test ? ?If + COVID, out of work for 10 days (unvaccinated), and should be treated with antiviral medication. ? ?If negative--out today and tomorrow. ?Will need work note. ? ?Declines Tessalon, not effective in the past. ?Counseled re: OTC meds (currently getting extra dextramethorphan, from both delsym and robitussin DM0. ? ?Pt sent message with negative home COVID test. ? ? ? ?Follow Up Instructions: ? ?  ?I discussed the assessment and treatment plan with the patient. The patient was provided an opportunity to ask questions and all were answered. The patient agreed with the plan and demonstrated an understanding of the instructions. ?  ?The patient was advised to call back or seek an in-person evaluation if the symptoms worsen or if the condition fails to improve as anticipated. ? ?I spent 24 minutes dedicated to the care of this patient, including pre-visit review of records, face to face time, post-visit ordering of testing and  documentation. ? ? ? ?Vikki Ports, MD ?

## 2021-08-02 ENCOUNTER — Encounter: Payer: Self-pay | Admitting: Family Medicine

## 2021-08-02 ENCOUNTER — Other Ambulatory Visit: Payer: Self-pay

## 2021-08-02 ENCOUNTER — Telehealth: Payer: Managed Care, Other (non HMO) | Admitting: Family Medicine

## 2021-08-02 ENCOUNTER — Ambulatory Visit: Payer: Managed Care, Other (non HMO) | Admitting: Physician Assistant

## 2021-08-02 VITALS — Temp 97.7°F | Ht 67.0 in | Wt 158.0 lb

## 2021-08-02 DIAGNOSIS — R0989 Other specified symptoms and signs involving the circulatory and respiratory systems: Secondary | ICD-10-CM

## 2021-08-02 NOTE — Patient Instructions (Signed)
Stay well hydrated. ?Stop the robitussin DM, as this is doubling up on cough suppressant with the Delsym. ?Instead, switch to PLAIN mucinex or robitussin (single ingredient of Guaifenesin, I suggest the 12 hour version, and take it twice daily). ?Use albuterol inhaler if needed for any wheezing or shortness of breath. ?You may continue the Qvar if you're finding it helpful. ? ?Contact us if you have recurrent fever, worsening sinus pain, cough, shortness of breath, as this could indicate a bacterial infection. ?

## 2021-08-03 ENCOUNTER — Encounter: Payer: Self-pay | Admitting: Family Medicine

## 2021-08-08 ENCOUNTER — Encounter: Payer: Self-pay | Admitting: Specialist

## 2021-08-08 ENCOUNTER — Ambulatory Visit: Payer: Self-pay

## 2021-08-08 ENCOUNTER — Other Ambulatory Visit: Payer: Self-pay

## 2021-08-08 ENCOUNTER — Other Ambulatory Visit (HOSPITAL_COMMUNITY): Payer: Self-pay

## 2021-08-08 ENCOUNTER — Ambulatory Visit: Payer: Managed Care, Other (non HMO) | Admitting: Specialist

## 2021-08-08 VITALS — BP 110/75 | HR 73 | Ht 67.0 in | Wt 190.0 lb

## 2021-08-08 DIAGNOSIS — G8929 Other chronic pain: Secondary | ICD-10-CM

## 2021-08-08 DIAGNOSIS — M5137 Other intervertebral disc degeneration, lumbosacral region: Secondary | ICD-10-CM

## 2021-08-08 DIAGNOSIS — M5136 Other intervertebral disc degeneration, lumbar region: Secondary | ICD-10-CM

## 2021-08-08 DIAGNOSIS — M5442 Lumbago with sciatica, left side: Secondary | ICD-10-CM | POA: Diagnosis not present

## 2021-08-08 MED ORDER — PREGABALIN 50 MG PO CAPS
50.0000 mg | ORAL_CAPSULE | Freq: Three times a day (TID) | ORAL | 0 refills | Status: DC
  Filled 2021-08-08: qty 90, 30d supply, fill #0

## 2021-08-08 MED ORDER — NAPROXEN 500 MG PO TABS
ORAL_TABLET | Freq: Two times a day (BID) | ORAL | 1 refills | Status: AC | PRN
  Filled 2021-08-08: qty 60, 30d supply, fill #0
  Filled 2021-12-16: qty 60, 30d supply, fill #1

## 2021-08-08 MED ORDER — DICLOFENAC SODIUM 50 MG PO TBEC
50.0000 mg | DELAYED_RELEASE_TABLET | Freq: Two times a day (BID) | ORAL | 3 refills | Status: DC
  Filled 2021-08-08: qty 60, 30d supply, fill #0
  Filled 2021-09-14: qty 60, 30d supply, fill #1

## 2021-08-08 NOTE — Patient Instructions (Signed)
Avoid frequent bending and stooping  ?No lifting greater than 10-15 lbs. ?May use ice or moist heat for pain. ?Weight loss is of benefit. ?Best medication for lumbar disc disease is arthritis medications like motrin, celebrex and naprosyn. ?Exercise is important to improve your indurance and does allow people to function better inspite of back pain. ?Start diclofenac 50 mg po BID with meal or a snack. Stop naprosyn. ?Start lyrica 50 mg up to TID for pain ?Obtain new MRI as the pain pattern has changed to assess for a worsening of a disc protrusion. ? ?

## 2021-08-08 NOTE — Progress Notes (Signed)
Office Visit Note   Patient: Penny Thomas           Date of Birth: Jul 24, 1985           MRN: [id number] Visit Date: 08/08/2021              Requested by: Magnus Sinning, [address] [place],  [place] [address] PCP: Irene Pap, PA-C   Assessment & Plan: Visit Diagnoses:  1. Chronic bilateral low back pain with left-sided sciatica   2. Annular tear of lumbar disc   3. Disc disease, degenerative, lumbar or lumbosacral   4. Left-sided low back pain with left-sided sciatica, unspecified chronicity     Plan: Avoid frequent bending and stooping  No lifting greater than 10-15 lbs. May use ice or moist heat for pain. Weight loss is of benefit. Best medication for lumbar disc disease is arthritis medications like motrin, celebrex and naprosyn. Exercise is important to improve your indurance and does allow people to function better inspite of back pain. Start diclofenac 50 mg po BID with meal or a snack. Stop naprosyn. Start lyrica 50 mg up to TID for pain Obtain new MRI as the pain pattern has changed to assess for a worsening of a disc protrusion.  Follow-Up Instructions: No follow-ups on file.   Orders:  Orders Placed This Encounter  Procedures   XR Lumbar Spine 2-3 Views   MR Lumbar Spine w/o contrast   Meds ordered this encounter  Medications   pregabalin (LYRICA) 50 MG capsule    Sig: Take 1 capsule (50 mg total) by mouth 3 (three) times daily.    Dispense:  90 capsule    Refill:  0   naproxen (NAPROSYN) 500 MG tablet    Sig: TAKE 1 TABLET BY MOUTH EVERY 12 HOURS AS NEEDED.    Dispense:  60 tablet    Refill:  1   diclofenac (VOLTAREN) 50 MG EC tablet    Sig: Take 1 tablet (50 mg total) by mouth 2 (two) times daily.    Dispense:  60 tablet    Refill:  3      Procedures: No procedures performed   Clinical Data: No additional findings.   Subjective: Chief Complaint  Patient presents with   Lower Back - Pain    36 year old female  with 3-4 year history of low back pain which at first was lumbosacral and right buttock and leg and over the past 2 years is mainly . Right leg pain is now not as bad as the left leg and she has been seen by Dr. Ernestina Patches and had injection with improvement in the left thigh and calf pain. She experienced numbness into the left foot, whole foot.    Review of Systems  Constitutional: Negative.   HENT:  Positive for congestion, rhinorrhea, sinus pressure and sinus pain.   Eyes: Negative.   Respiratory: Negative.    Cardiovascular: Negative.   Gastrointestinal: Negative.   Endocrine: Negative.   Genitourinary: Negative.   Musculoskeletal: Negative.   Skin: Negative.   Allergic/Immunologic: Negative.   Neurological: Negative.   Hematological: Negative.   Psychiatric/Behavioral: Negative.      Objective: Vital Signs: BP 110/75 (BP Location: Left Arm, Patient Position: Sitting)   Pulse 73   Ht '5\' 7"'$  (1.702 m)   Wt 190 lb (86.2 kg)   LMP 11/29/2015   BMI 29.76 kg/m   Physical Exam  Ortho Exam  Specialty Comments:  No specialty comments available.  Imaging: No results found.   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   Panic attacks 06/16/2019   Situational anxiety 06/16/2019   Great toe pain, left 06/16/2019   History of gestational diabetes 12/02/2018   Prediabetes 12/02/2018   Mild persistent asthma without complication 48/54/6270   Acute nasopharyngitis 12/11/2016   Intractable chronic migraine without aura and without status migrainosus 06/23/2015   Migraines 03/23/2015   Right-sided low back pain without sciatica 03/23/2015   Muscle cramps 03/23/2015   Dysmenorrhea 04/08/2013   Endometriosis 04/08/2013   Past Medical  History:  Diagnosis Date   Allergy    Anemia    takes iron supplement   Anxiety    Asthma    Asthma    prn inhaler   Blood transfusion 2007   Blood transfusion 2010   Blood transfusion without reported diagnosis    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   H/O: hysterectomy 10/2019   Hemorrhage in uterus 01/20/2009   Hypertension    Migraines    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   Pre-diabetes    Recurrent upper respiratory infection (URI)    Seasonal allergies    Sickle cell anemia (HCC)    trait   Sickle cell trait ([facility])     Family History  Problem Relation Age of Onset   Fibromyalgia Mother    Multiple sclerosis Mother    Asthma Father    Healthy Maternal Grandmother    Healthy Maternal Grandfather    Healthy Paternal Grandmother    Healthy Paternal Grandfather    Endometriosis Sister    Healthy Brother    Healthy Child     Past Surgical History:  Procedure Laterality Date   CESAREAN SECTION  09-28-05   CESAREAN SECTION  01-20-09   CESAREAN SECTION     ENDOMETRIAL ABLATION  10/2011   HEROPTIOIN   KNEE ARTHROSCOPY Right 02/16/2014   Procedure: RIGHT ARTHROSCOPY KNEE;  Surgeon: Kerin Salen, MD;  Location: [facility];  Service: Orthopedics;  Laterality: Right;   POLYP REMOVAL  5-08   ?CERVICAL OR UTERINE POLYP   ROBOTIC ASSISTED LAPAROSCOPIC HYSTERECTOMY AND SALPINGECTOMY Bilateral 12/01/2019   Procedure: XI ROBOTIC ASSISTEDTOTAL TOTAL LAPAROSCOPIC HYSTERECTOMY AND RIGHT SALPINGECTOMY , EXTENSIVE LYSIS OF ADHESIONS;  Surgeon: [name], MD;  Location: [facility];  Service: Gynecology;  Laterality: Bilateral;  request 8:30am OR time. Requests 2 hours   TUBAL LIGATION  01-20-09   DONE WITH C-SECTION   WISDOM TOOTH EXTRACTION Right 2014   Social History   Occupational History   Occupation: NT/Transporter  Tobacco Use   Smoking status: Never   Smokeless tobacco: Never  Vaping Use   Vaping Use: Never used  Substance and  Sexual Activity   Alcohol use: Yes    Alcohol/week: 0.0 standard drinks    Comment: seldom   Drug use: No   Sexual activity: Yes    Birth control/protection: Surgical    Comment: TUBAL LIGATION, [place], INTERCOURSE AGE 32, SEXUAL PARTNERS 5

## 2021-08-14 ENCOUNTER — Other Ambulatory Visit (HOSPITAL_COMMUNITY): Payer: Self-pay

## 2021-08-21 ENCOUNTER — Ambulatory Visit
Admission: RE | Admit: 2021-08-21 | Discharge: 2021-08-21 | Disposition: A | Discharge status: Home / Self Care | Payer: Managed Care, Other (non HMO) | Source: Ambulatory Visit | Attending: Specialist | Admitting: Specialist

## 2021-08-21 ENCOUNTER — Other Ambulatory Visit: Payer: Self-pay

## 2021-08-21 DIAGNOSIS — M5442 Lumbago with sciatica, left side: Secondary | ICD-10-CM

## 2021-08-21 DIAGNOSIS — M5136 Other intervertebral disc degeneration, lumbar region: Secondary | ICD-10-CM

## 2021-08-21 DIAGNOSIS — M5137 Other intervertebral disc degeneration, lumbosacral region: Secondary | ICD-10-CM

## 2021-08-31 ENCOUNTER — Encounter: Payer: Self-pay | Admitting: Specialist

## 2021-09-05 ENCOUNTER — Other Ambulatory Visit (HOSPITAL_COMMUNITY): Payer: Self-pay

## 2021-09-05 ENCOUNTER — Other Ambulatory Visit: Payer: Self-pay | Admitting: Specialist

## 2021-09-05 MED ORDER — TRAMADOL HCL 50 MG PO TABS
100.0000 mg | ORAL_TABLET | Freq: Four times a day (QID) | ORAL | 0 refills | Status: DC | PRN
  Filled 2021-09-05: qty 40, 5d supply, fill #0

## 2021-09-07 ENCOUNTER — Ambulatory Visit: Payer: BC Managed Care – PPO | Admitting: Specialist

## 2021-09-07 ENCOUNTER — Encounter: Payer: Self-pay | Admitting: Specialist

## 2021-09-07 VITALS — BP 109/71 | HR 76 | Ht 67.0 in | Wt 187.0 lb

## 2021-09-07 DIAGNOSIS — M5416 Radiculopathy, lumbar region: Secondary | ICD-10-CM | POA: Diagnosis not present

## 2021-09-07 DIAGNOSIS — M5116 Intervertebral disc disorders with radiculopathy, lumbar region: Secondary | ICD-10-CM | POA: Diagnosis not present

## 2021-09-07 NOTE — Progress Notes (Signed)
? ?Office Visit Note ?  ?Patient: Penny Thomas           ?Date of Birth: Feb 21, 1986           ?MRN: [id number] ?Visit Date: 09/07/2021 ?             ?Requested by: Irene Pap, PA-C ?[address] ?[place],  [place] [address] ?PCP: Irene Pap, PA-C ? ? ?Assessment & Plan: ?Visit Diagnoses:  ?1. Lumbar radiculopathy   ?2. Lumbar disc herniation with radiculopathy   ? ? ?Plan: Take tramadol 1-1 1/2 every 4-5 hours for pain. ?Our surgery scheduler Kandice Hams with call to arrange for surgery. ?Surgery recommended is a bilateral L5-S1 microdiscectomy for central disc protrusion L5-S1 with bilateral SI nerve compression.  ?Risks of surgery infection 1:300, risk of transfusion less than 05/998, risk to the nerve roots is 1 in 10,000. ? ?Follow-Up Instructions: Return in about 4 weeks (around 10/05/2021).  ? ?Orders:  ?No orders of the defined types were placed in this encounter. ? ?No orders of the defined types were placed in this encounter. ? ? ? ? Procedures: ?No procedures performed ? ? ?Clinical Data: ?No additional findings. ? ? ?Subjective: ?Chief Complaint  ?Patient presents with  ? Lower Back - Follow-up  ?  MRI review  ? ? ?36 year old female with history of back pain and intermittant leg pain left greater than right. She works as a Museum/gallery exhibitions officer. She had a worsening of the pain in her back and buttocks and called requesting meds for pain. Recent MRI was done 3/29 and this shows and central disc protrusion with bilateral S1 nerve impression. She is needing more medication but is working. Her hours are sometimes as much as 12 hours. ? ? ?Review of Systems  ?Constitutional: Negative.   ?HENT: Negative.    ?Eyes: Negative.   ?Respiratory: Negative.    ?Cardiovascular: Negative.   ?Gastrointestinal: Negative.   ?Endocrine: Negative.   ?Genitourinary: Negative.   ?Musculoskeletal: Negative.   ?Skin: Negative.   ?Allergic/Immunologic: Negative.   ?Neurological: Negative.   ?Hematological:  Negative.   ?Psychiatric/Behavioral: Negative.    ? ? ?Objective: ?Vital Signs: BP 109/71   Pulse 76   Ht '5\' 7"'$  (1.702 m)   Wt 187 lb (84.8 kg)   LMP 11/29/2015   BMI 29.29 kg/m?  ? ?Physical Exam ?Constitutional:   ?   Appearance: She is well-developed.  ?HENT:  ?   Head: Normocephalic and atraumatic.  ?Eyes:  ?   Pupils: Pupils are equal, round, and reactive to light.  ?Pulmonary:  ?   Effort: Pulmonary effort is normal.  ?   Breath sounds: Normal breath sounds.  ?Abdominal:  ?   General: Bowel sounds are normal.  ?   Palpations: Abdomen is soft.  ?Musculoskeletal:  ?   Cervical back: Normal range of motion and neck supple.  ?   Lumbar back: Positive right straight leg raise test and positive left straight leg raise test.  ?Skin: ?   General: Skin is warm and dry.  ?Neurological:  ?   Mental Status: She is alert and oriented to person, place, and time.  ?Psychiatric:     ?   Behavior: Behavior normal.     ?   Thought Content: Thought content normal.     ?   Judgment: Judgment normal.  ? ? ?Back Exam  ? ?Tenderness  ?The patient is experiencing tenderness in the lumbar. ? ?Range of Motion  ?Extension:  abnormal  ?Flexion:  abnormal  ?Lateral bend right:  abnormal  ?Rotation right:  abnormal  ?Rotation left:  abnormal  ? ?Muscle Strength  ?Right Quadriceps:  5/5  ?Left Quadriceps:  5/5  ?Right Hamstrings:  5/5  ?Left Hamstrings:  5/5  ? ?Tests  ?Straight leg raise right: positive ?Straight leg raise left: positive ? ?Reflexes  ?Achilles:  2/4 ?Biceps:  2/4 ? ?Other  ?Toe walk: abnormal ?Heel walk: abnormal ?Sensation: normal ?Erythema: no back redness ?Scars: absent ? ? ? ? ?Specialty Comments:  ?No specialty comments available. ? ?Imaging: ?No results found. ? ? ?PMFS History: ?Patient Active Problem List  ? Diagnosis Date Noted  ? History of COVID-19 05/31/2020  ? Acute non-recurrent frontal sinusitis 05/31/2020  ? Cough 05/31/2020  ? COVID-19 05/24/2020  ? Postoperative state 12/01/2019  ? Fear of bridges  06/16/2019  ? 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  ? Panic attacks 06/16/2019  ? Situational anxiety 06/16/2019  ? Great toe pain, left 06/16/2019  ? History of gestational diabetes 12/02/2018  ? Prediabetes 12/02/2018  ? Mild persistent asthma without complication 63/78/5885  ? Acute nasopharyngitis 12/11/2016  ? Intractable chronic migraine without aura and without status migrainosus 06/23/2015  ? Migraines 03/23/2015  ? Right-sided low back pain without sciatica 03/23/2015  ? Muscle cramps 03/23/2015  ? Dysmenorrhea 04/08/2013  ? Endometriosis 04/08/2013  ? ?Past Medical History:  ?Diagnosis Date  ? Allergy   ? Anemia   ? takes iron supplement  ? Anxiety   ? Asthma   ? Asthma   ? prn inhaler  ? Blood transfusion 2007  ? Blood transfusion 2010  ? Blood transfusion without reported diagnosis   ? 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 H/O: hysterectomy 10/2019  ? Hemorrhage in uterus 01/20/2009  ? Hypertension   ? Migraines   ? 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 Pre-diabetes   ? Recurrent upper respiratory infection (URI)   ? Seasonal allergies   ? Sickle cell anemia (HCC)   ? trait  ? Sickle cell trait ([facility])   ?  ?Family History  ?Problem Relation Age of Onset  ? Fibromyalgia Mother   ? Multiple sclerosis Mother   ? Asthma Father   ? Healthy Maternal Grandmother   ? Healthy Maternal Grandfather   ? Healthy Paternal Grandmother   ? Healthy Paternal Grandfather   ? Endometriosis Sister   ? Healthy Brother   ? Healthy Child   ?  ?Past Surgical History:  ?Procedure Laterality Date  ? CESAREAN SECTION  09-28-05  ? CESAREAN SECTION  01-20-09  ? CESAREAN SECTION    ? ENDOMETRIAL ABLATION  10/2011  ? HEROPTIOIN  ? KNEE ARTHROSCOPY Right 02/16/2014  ? Procedure: RIGHT ARTHROSCOPY KNEE;  Surgeon: Kerin Salen, MD;  Location: [facility];  Service: Orthopedics;  Laterality: Right;  ? POLYP REMOVAL  5-08  ? ?CERVICAL OR UTERINE POLYP  ? ROBOTIC ASSISTED LAPAROSCOPIC HYSTERECTOMY AND SALPINGECTOMY Bilateral 12/01/2019  ?  Procedure: XI ROBOTIC ASSISTEDTOTAL TOTAL LAPAROSCOPIC HYSTERECTOMY AND RIGHT SALPINGECTOMY , EXTENSIVE LYSIS OF ADHESIONS;  Surgeon: [name], MD;  Location: [facility];  Service: Gynecology;  Laterality: Bilateral;  request 8:30am OR time. ?Requests 2 hours  ? TUBAL LIGATION  01-20-09  ? DONE WITH C-SECTION  ? WISDOM TOOTH EXTRACTION Right 2014  ? ?Social History  ? ?Occupational History  ? 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 Smoking status: Never  ? Smokeless tobacco: Never  ?Vaping Use  ?  Vaping Use: Never used  ?Substance and Sexual Activity  ? Alcohol use: Yes  ?  Alcohol/week: 0.0 standard drinks  ?  Comment: seldom  ? Drug use: No  ? Sexual activity: Yes  ?  Birth control/protection: Surgical  ?  Comment: TUBAL LIGATION, Hyst, INTERCOURSE AGE 65, SEXUAL PARTNERS 5  ? ? ? ? ? ? ?

## 2021-09-07 NOTE — Patient Instructions (Signed)
?  Take tramadol 1-1 1/2 every 4-5 hours for pain. ?Our surgery scheduler Kandice Hams with call to arrange for surgery. ?Surgery recommended is a bilateral L5-S1 microdiscectomy for central disc protrusion L5-S1 with bilateral SI nerve compression.  ?Risks of surgery infection 1:300, risk of transfusion less than 05/998, risk to the nerve roots is 1 in 10,000. ?  ?

## 2021-09-14 ENCOUNTER — Other Ambulatory Visit: Payer: Self-pay | Admitting: Family Medicine

## 2021-09-14 ENCOUNTER — Other Ambulatory Visit: Payer: Self-pay | Admitting: Specialist

## 2021-09-14 ENCOUNTER — Other Ambulatory Visit (HOSPITAL_COMMUNITY): Payer: Self-pay

## 2021-09-14 MED ORDER — PREGABALIN 50 MG PO CAPS
50.0000 mg | ORAL_CAPSULE | Freq: Three times a day (TID) | ORAL | 0 refills | Status: DC
  Filled 2021-09-14: qty 90, 30d supply, fill #0

## 2021-09-14 MED ORDER — CETIRIZINE HCL 10 MG PO TABS
10.0000 mg | ORAL_TABLET | Freq: Every day | ORAL | 5 refills | Status: AC
Start: 2021-09-14 — End: ?
  Filled 2021-09-14 – 2021-12-07 (×3): qty 30, 30d supply, fill #0
  Filled 2022-01-12 – 2022-03-25 (×6): qty 30, 30d supply, fill #1

## 2021-10-08 ENCOUNTER — Encounter: Payer: Self-pay | Admitting: Specialist

## 2021-10-09 ENCOUNTER — Other Ambulatory Visit: Payer: Self-pay | Admitting: Specialist

## 2021-10-09 ENCOUNTER — Other Ambulatory Visit (HOSPITAL_COMMUNITY): Payer: Self-pay

## 2021-10-09 MED ORDER — TRAMADOL HCL 50 MG PO TABS
100.0000 mg | ORAL_TABLET | Freq: Four times a day (QID) | ORAL | 0 refills | Status: DC | PRN
  Filled 2021-10-09: qty 40, 5d supply, fill #0

## 2021-10-09 NOTE — Telephone Encounter (Signed)
Need surgery sheet.  Thanks! ?

## 2021-10-10 NOTE — Telephone Encounter (Signed)
I started the surg sheet ?

## 2021-10-19 ENCOUNTER — Other Ambulatory Visit (HOSPITAL_COMMUNITY): Payer: Self-pay

## 2021-10-24 ENCOUNTER — Encounter: Payer: Self-pay | Admitting: Physician Assistant

## 2021-10-24 ENCOUNTER — Ambulatory Visit: Payer: Self-pay | Admitting: Physician Assistant

## 2021-10-24 ENCOUNTER — Ambulatory Visit: Payer: BC Managed Care – PPO | Admitting: Physician Assistant

## 2021-10-24 VITALS — BP 120/70 | HR 78 | Ht 67.0 in | Wt 190.2 lb

## 2021-10-24 DIAGNOSIS — Z01818 Encounter for other preprocedural examination: Secondary | ICD-10-CM

## 2021-10-24 DIAGNOSIS — M5116 Intervertebral disc disorders with radiculopathy, lumbar region: Secondary | ICD-10-CM | POA: Diagnosis not present

## 2021-10-24 DIAGNOSIS — M5416 Radiculopathy, lumbar region: Secondary | ICD-10-CM

## 2021-10-24 DIAGNOSIS — R7303 Prediabetes: Secondary | ICD-10-CM

## 2021-10-24 DIAGNOSIS — J4530 Mild persistent asthma, uncomplicated: Secondary | ICD-10-CM

## 2021-10-24 DIAGNOSIS — G43719 Chronic migraine without aura, intractable, without status migrainosus: Secondary | ICD-10-CM

## 2021-10-24 NOTE — Progress Notes (Deleted)
Established Patient Office Visit  Subjective:  Patient ID: CARRAH EPPOLITO, female    DOB: [birth year]  Age: 36 y.o. MRN: [id number]  CC: No chief complaint on file.   HPI Silvestre Mesi presents for ***  Outpatient Medications Prior to Visit  Medication Sig Dispense Refill   albuterol (VENTOLIN HFA) 108 (90 Base) MCG/ACT inhaler Inhale 4 puffs into the lungs every 4-6 hours as needed 8.5 g 1   ALVESCO 160 MCG/ACT inhaler Inhale 1 puff into the lungs 2 (two) times daily. 6.1 g 5   beclomethasone (QVAR) 80 MCG/ACT inhaler Inhale into the lungs 2 (two) times daily.     cetirizine (ZYRTEC) 10 MG tablet Take 1 tablet by mouth daily. 30 tablet 5   Dextromethorphan HBr (DELSYM PO) Take 10 mLs by mouth as needed.     diclofenac (VOLTAREN) 50 MG EC tablet Take 1 tablet (50 mg total) by mouth 2 (two) times daily. 60 tablet 3   famotidine (PEPCID) 20 MG tablet Take 1 tablet by mouth 1 - 2 times per day for itching 60 tablet 5   fexofenadine (ALLEGRA) 180 MG tablet TAKE 1 TABLET BY MOUTH ONCE A DAY 7 tablet 0   FLUoxetine (PROZAC) 20 MG tablet Take 1/2 tablet by mouth once daily for the first week, then increase to 1 tablet daily if tolerated 30 tablet 5   fluticasone (FLONASE) 50 MCG/ACT nasal spray Place 2 sprays in each nostril daily as directed 16 g 0   guaiFENesin (ROBITUSSIN CHEST CONGESTION PO) Take 30 mLs by mouth as needed.     Lasmiditan Succinate (REYVOW) 100 MG TABS Take 1 tablet by mouth daily as needed (No more than 1 tab in 24 h). 30 tablet 3   naproxen (NAPROSYN) 500 MG tablet TAKE 1 TABLET BY MOUTH EVERY 12 HOURS AS NEEDED. 60 tablet 1   phenylephrine (SUDAFED PE) 10 MG TABS tablet Take 10 mg by mouth every 4 (four) hours as needed.     pregabalin (LYRICA) 50 MG capsule Take 1 capsule by mouth 3 times daily. 90 capsule 0   propranolol ER (INDERAL LA) 120 MG 24 hr capsule Take 1 capsule (120 mg total) by mouth at bedtime. 30 capsule 5   simethicone (MYLICON) 793 MG  chewable tablet Chew 125 mg by mouth in the morning and at bedtime.     SUMAtriptan 6 MG/0.5ML SOAJ INJECT 6 MG INTO THE SKIN AS NEEDED (MAY REPEAT IN ONE HOUR. MAXIMUM 2 INJECTIONS IN 24 HOURS.). 3 mL 5   traMADol (ULTRAM) 50 MG tablet Take 1 tablet by mouth every 8 hours as needed for moderate or severe pain. 15 tablet 0   traMADol (ULTRAM) 50 MG tablet Take 2 tablets by mouth every 6 hours as needed. 40 tablet 0   No facility-administered medications prior to visit.    Allergies  Allergen Reactions   [name] [Elagolix] Other (See Comments)    Hallucinations    Patient Care Team: Marcellina Millin as PCP - General (Physician Assistant) Patient, No Pcp Per (Inactive) (General Practice) Pieter Partridge, DO as Consulting Physician (Neurology)  ROS Review of Systems    Objective:    Physical Exam  LMP 11/29/2015   Wt Readings from Last 3 Encounters:  09/07/21 187 lb (84.8 kg)  08/08/21 190 lb (86.2 kg)  08/02/21 158 lb (71.7 kg)    Results for orders placed or performed in visit on 04/26/21  Influenza A/B  Result Value Ref Range  Influenza A, POC Negative Negative   Influenza B, POC Negative Negative     {Labs (Optional):23779}  The ASCVD Risk score (Arnett DK, et al., 2019) failed to calculate for the following reasons:   The 2019 ASCVD risk score is only valid for ages 54 to 48    Assessment & Plan:   Problem List Items Addressed This Visit   None   No orders of the defined types were placed in this encounter.   Follow-up: No follow-ups on file.    Irene Pap, PA-C

## 2021-10-24 NOTE — Progress Notes (Signed)
Acute Office Visit  Subjective:    Patient ID: Penny Thomas, female    DOB: 11-30-1985, 36 y.o.   MRN: [id number]  Chief Complaint  Patient presents with   Acute Visit    Surgery clearance and form needs to be completed. No concerns    HPI Patient is in today for preoperative clearance from [facility] to have lumbar microdiscectomy with general anesthesia by Dr. Basil Dess; reports that her asthma is stable, last used inhaler about 2 months ago; last migraine headache was last week; drinks 100 ounces water a day; eats a low salt and minimal caffeine    Outpatient Medications Prior to Visit  Medication Sig Dispense Refill   albuterol (VENTOLIN HFA) 108 (90 Base) MCG/ACT inhaler Inhale 4 puffs into the lungs every 4-6 hours as needed 8.5 g 1   ALVESCO 160 MCG/ACT inhaler Inhale 1 puff into the lungs 2 (two) times daily. 6.1 g 5   beclomethasone (QVAR) 80 MCG/ACT inhaler Inhale into the lungs 2 (two) times daily.     cetirizine (ZYRTEC) 10 MG tablet Take 1 tablet by mouth daily. 30 tablet 5   famotidine (PEPCID) 20 MG tablet Take 1 tablet by mouth 1 - 2 times per day for itching 60 tablet 5   naproxen (NAPROSYN) 500 MG tablet TAKE 1 TABLET BY MOUTH EVERY 12 HOURS AS NEEDED. 60 tablet 1   propranolol ER (INDERAL LA) 120 MG 24 hr capsule Take 1 capsule (120 mg total) by mouth at bedtime. 30 capsule 5   simethicone (MYLICON) 030 MG chewable tablet Chew 125 mg by mouth in the morning and at bedtime.     SUMAtriptan 6 MG/0.5ML SOAJ INJECT 6 MG INTO THE SKIN AS NEEDED (MAY REPEAT IN ONE HOUR. MAXIMUM 2 INJECTIONS IN 24 HOURS.). 3 mL 5   traMADol (ULTRAM) 50 MG tablet Take 1 tablet by mouth every 8 hours as needed for moderate or severe pain. 15 tablet 0   traMADol (ULTRAM) 50 MG tablet Take 2 tablets by mouth every 6 hours as needed. 40 tablet 0   Dextromethorphan HBr (DELSYM PO) Take 10 mLs by mouth as needed. (Patient not taking: Reported on 10/24/2021)      fexofenadine (ALLEGRA) 180 MG tablet TAKE 1 TABLET BY MOUTH ONCE A DAY 7 tablet 0   fluticasone (FLONASE) 50 MCG/ACT nasal spray Place 2 sprays in each nostril daily as directed (Patient not taking: Reported on 10/24/2021) 16 g 0   diclofenac (VOLTAREN) 50 MG EC tablet Take 1 tablet (50 mg total) by mouth 2 (two) times daily. 60 tablet 3   FLUoxetine (PROZAC) 20 MG tablet Take 1/2 tablet by mouth once daily for the first week, then increase to 1 tablet daily if tolerated (Patient not taking: Reported on 10/24/2021) 30 tablet 5   guaiFENesin (ROBITUSSIN CHEST CONGESTION PO) Take 30 mLs by mouth as needed. (Patient not taking: Reported on 10/24/2021)     Lasmiditan Succinate (REYVOW) 100 MG TABS Take 1 tablet by mouth daily as needed (No more than 1 tab in 24 h). (Patient not taking: Reported on 10/24/2021) 30 tablet 3   phenylephrine (SUDAFED PE) 10 MG TABS tablet Take 10 mg by mouth every 4 (four) hours as needed.     pregabalin (LYRICA) 50 MG capsule Take 1 capsule by mouth 3 times daily. 90 capsule 0   No facility-administered medications prior to visit.    Allergies  Allergen Reactions   [name] [Elagolix] Other (See Comments)  Hallucinations    Review of Systems  Constitutional:  Negative for activity change and chills.  HENT:  Negative for congestion and voice change.   Eyes:  Negative for pain and redness.  Respiratory:  Negative for cough and wheezing.   Cardiovascular:  Negative for chest pain.  Gastrointestinal:  Negative for constipation, diarrhea, nausea and vomiting.  Endocrine: Negative for polyuria.  Genitourinary:  Negative for frequency.  Skin:  Negative for color change and rash.  Allergic/Immunologic: Negative for immunocompromised state.  Neurological:  Negative for dizziness.  Psychiatric/Behavioral:  Negative for agitation.       Objective:    Physical Exam Vitals and nursing note reviewed.  Constitutional:      General: She is not in acute distress.     Appearance: Normal appearance. She is not ill-appearing.  HENT:     Head: Normocephalic and atraumatic.     Right Ear: External ear normal.     Left Ear: External ear normal.     Nose: No congestion.  Eyes:     Extraocular Movements: Extraocular movements intact.     Conjunctiva/sclera: Conjunctivae normal.     Pupils: Pupils are equal, round, and reactive to light.  Cardiovascular:     Rate and Rhythm: Normal rate and regular rhythm.     Pulses: Normal pulses.     Heart sounds: Normal heart sounds.  Pulmonary:     Effort: Pulmonary effort is normal.     Breath sounds: Normal breath sounds. No wheezing.  Abdominal:     General: Bowel sounds are normal.     Palpations: Abdomen is soft.  Musculoskeletal:        General: Normal range of motion.     Cervical back: Normal range of motion and neck supple.     Right lower leg: No edema.     Left lower leg: No edema.  Skin:    General: Skin is warm and dry.     Findings: No bruising.  Neurological:     General: No focal deficit present.     Mental Status: She is alert and oriented to person, place, and time.  Psychiatric:        Mood and Affect: Mood normal.        Behavior: Behavior normal.        Thought Content: Thought content normal.    BP 120/70   Pulse 78   Wt 190 lb 3.2 oz (86.3 kg)   LMP 11/29/2015   SpO2 99%   BMI 29.79 kg/m   Wt Readings from Last 3 Encounters:  10/24/21 190 lb 3.2 oz (86.3 kg)  09/07/21 187 lb (84.8 kg)  08/08/21 190 lb (86.2 kg)    Results for orders placed or performed in visit on 10/24/21  CBC with Differential/Platelet  Result Value Ref Range   WBC 5.3 3.4 - 10.8 x10E3/uL   RBC 4.31 3.77 - 5.28 x10E6/uL   Hemoglobin 12.6 11.1 - 15.9 g/dL   Hematocrit 37.5 34.0 - 46.6 %   MCV 87 79 - 97 fL   MCH 29.2 26.6 - 33.0 pg   MCHC 33.6 31.5 - 35.7 g/dL   RDW 13.8 11.7 - 15.4 %   Platelets 245 150 - 450 x10E3/uL   Neutrophils 60 Not Estab. %   Lymphs 30 Not Estab. %   Monocytes 9 Not  Estab. %   Eos 1 Not Estab. %   Basos 0 Not Estab. %   Neutrophils Absolute 3.1 1.4 - 7.0  x10E3/uL   Lymphocytes Absolute 1.6 0.7 - 3.1 x10E3/uL   Monocytes Absolute 0.5 0.1 - 0.9 x10E3/uL   EOS (ABSOLUTE) 0.1 0.0 - 0.4 x10E3/uL   Basophils Absolute 0.0 0.0 - 0.2 x10E3/uL   Immature Granulocytes 0 Not Estab. %   Immature Grans (Abs) 0.0 0.0 - 0.1 x10E3/uL  Comprehensive metabolic panel  Result Value Ref Range   Glucose 115 (H) 70 - 99 mg/dL   BUN 10 6 - 20 mg/dL   Creatinine, Ser 0.74 0.57 - 1.00 mg/dL   eGFR 107 >59 mL/min/1.73   BUN/Creatinine Ratio 14 9 - 23   Sodium 142 134 - 144 mmol/L   Potassium 4.0 3.5 - 5.2 mmol/L   Chloride 102 96 - 106 mmol/L   CO2 19 (L) 20 - 29 mmol/L   Calcium 9.3 8.7 - 10.2 mg/dL   Total Protein 7.1 6.0 - 8.5 g/dL   Albumin 4.5 3.8 - 4.8 g/dL   Globulin, Total 2.6 1.5 - 4.5 g/dL   Albumin/Globulin Ratio 1.7 1.2 - 2.2   Bilirubin Total 0.3 0.0 - 1.2 mg/dL   Alkaline Phosphatase 52 44 - 121 IU/L   AST 22 0 - 40 IU/L   ALT 16 0 - 32 IU/L  Hemoglobin A1c  Result Value Ref Range   Hgb A1c MFr Bld 5.4 4.8 - 5.6 %   Est. average glucose Bld gHb Est-mCnc 108 mg/dL       Assessment & Plan:  1. Preoperative clearance - CBC with Differential/Platelet - Comprehensive metabolic panel - Hemoglobin A1c - cleared for surgery  2. Lumbar radiculopathy & 3. Lumbar disc herniation with radiculopathy- follow up with Orthopedics  4. Prediabetes - Comprehensive metabolic panel - Hemoglobin A1c  5. Mild persistent asthma without complication - CBC with Differential/Platelet - Comprehensive metabolic panel  6. Intractable chronic migraine without aura and without status migrainosus - CBC with Differential/Platelet - Comprehensive metabolic panel  Cleared for surgery - lumbar microdiscectomy  No orders of the defined types were placed in this encounter.   Return for Call for a Follow Up Appointment.  Irene Pap, PA-C

## 2021-10-25 LAB — COMPREHENSIVE METABOLIC PANEL
ALT: 16 IU/L (ref 0–32)
AST: 22 IU/L (ref 0–40)
Albumin/Globulin Ratio: 1.7 (ref 1.2–2.2)
Albumin: 4.5 g/dL (ref 3.8–4.8)
Alkaline Phosphatase: 52 IU/L (ref 44–121)
BUN/Creatinine Ratio: 14 (ref 9–23)
BUN: 10 mg/dL (ref 6–20)
Bilirubin Total: 0.3 mg/dL (ref 0.0–1.2)
CO2: 19 mmol/L — ABNORMAL LOW (ref 20–29)
Calcium: 9.3 mg/dL (ref 8.7–10.2)
Chloride: 102 mmol/L (ref 96–106)
Creatinine, Ser: 0.74 mg/dL (ref 0.57–1.00)
Globulin, Total: 2.6 g/dL (ref 1.5–4.5)
Glucose: 115 mg/dL — ABNORMAL HIGH (ref 70–99)
Potassium: 4 mmol/L (ref 3.5–5.2)
Sodium: 142 mmol/L (ref 134–144)
Total Protein: 7.1 g/dL (ref 6.0–8.5)
eGFR: 107 mL/min/{1.73_m2} (ref >59)

## 2021-10-25 LAB — CBC WITH DIFFERENTIAL/PLATELET
Basophils Absolute: 0 10*3/uL (ref 0.0–0.2)
Basos: 0 %
EOS (ABSOLUTE): 0.1 10*3/uL (ref 0.0–0.4)
Eos: 1 %
Hematocrit: 37.5 % (ref 34.0–46.6)
Hemoglobin: 12.6 g/dL (ref 11.1–15.9)
Immature Grans (Abs): 0 10*3/uL (ref 0.0–0.1)
Immature Granulocytes: 0 %
Lymphocytes Absolute: 1.6 10*3/uL (ref 0.7–3.1)
Lymphs: 30 %
MCH: 29.2 pg (ref 26.6–33.0)
MCHC: 33.6 g/dL (ref 31.5–35.7)
MCV: 87 fL (ref 79–97)
Monocytes Absolute: 0.5 10*3/uL (ref 0.1–0.9)
Monocytes: 9 %
Neutrophils Absolute: 3.1 10*3/uL (ref 1.4–7.0)
Neutrophils: 60 %
Platelets: 245 10*3/uL (ref 150–450)
RBC: 4.31 x10E6/uL (ref 3.77–5.28)
RDW: 13.8 % (ref 11.7–15.4)
WBC: 5.3 10*3/uL (ref 3.4–10.8)

## 2021-10-25 LAB — HEMOGLOBIN A1C
Est. average glucose Bld gHb Est-mCnc: 108 mg/dL
Hgb A1c MFr Bld: 5.4 % (ref 4.8–5.6)

## 2021-10-28 NOTE — Assessment & Plan Note (Signed)
>>  ASSESSMENT AND PLAN FOR INTRACTABLE CHRONIC MIGRAINE WITHOUT AURA AND WITHOUT STATUS MIGRAINOSUS WRITTEN ON 10/28/2021  9:33 PM BY Mayford Knife, LYNNE B, PA-C  Stable, continue current management

## 2021-10-28 NOTE — Assessment & Plan Note (Signed)
controlled, eat a low sugar diet, avoid starchy food with a lot of carbohydrates, avoid fried and processed foods; last hgb a1c 5.4 on 10/24/2021

## 2021-10-28 NOTE — Assessment & Plan Note (Signed)
Stable, continue current management 

## 2021-11-01 ENCOUNTER — Other Ambulatory Visit: Payer: Self-pay

## 2021-11-04 ENCOUNTER — Other Ambulatory Visit: Payer: Self-pay | Admitting: Specialist

## 2021-11-05 ENCOUNTER — Other Ambulatory Visit (HOSPITAL_COMMUNITY): Payer: Self-pay

## 2021-11-05 MED ORDER — TRAMADOL HCL 50 MG PO TABS
100.0000 mg | ORAL_TABLET | Freq: Four times a day (QID) | ORAL | 0 refills | Status: DC | PRN
  Filled 2021-11-05: qty 40, 5d supply, fill #0

## 2021-11-05 NOTE — Pre-Procedure Instructions (Signed)
Surgical Instructions    Your procedure is scheduled on Monday, June 19th.  Report to [facility] Main Entrance "A" at 5:30 A.M., then check in with the Admitting office.  Call this number if you have problems the morning of surgery:  [phone]   If you have any questions prior to your surgery date call [phone]: Open Monday-Friday 8am-4pm    Remember:  Do not eat after midnight the night before your surgery  You may drink clear liquids until 4:30 a.m. the morning of your surgery.   Clear liquids allowed are: Water, Non-Citrus Juices (without pulp), Carbonated Beverages, Clear Tea, Black Coffee Only (NO MILK, CREAM OR POWDERED CREAMER of any kind), and Gatorade.   Enhanced Recovery after Surgery for Orthopedics Enhanced Recovery after Surgery is a protocol used to improve the stress on your body and your recovery after surgery.  Patient Instructions  The day of surgery (if you do NOT have diabetes):  Drink ONE (1) Pre-Surgery Clear Ensure by 4:30 am the morning of surgery   This drink was given to you during your hospital  pre-op appointment visit. Nothing else to drink after completing the  Pre-Surgery Clear Ensure.         If you have questions, please contact your surgeon's office.     Take these medicines the morning of surgery with A SIP OF WATER  beclomethasone (QVAR)  cetirizine (ZYRTEC)   As needed: Albuterol inhaler-please bring with you to the hospital famotidine (PEPCID) fluticasone (FLONASE) SUMAtriptan  traMADol (ULTRAM)  As of today, STOP taking any Aspirin (unless otherwise instructed by your surgeon) Aleve, Naproxen, Ibuprofen, Motrin, Advil, Goody's, BC's, all herbal medications, fish oil, and all vitamins.                     Do NOT Smoke (Tobacco/Vaping) for 24 hours prior to your procedure.  If you use a CPAP at night, you may bring your mask/headgear for your overnight stay.   Contacts, glasses, piercing's, hearing aid's, dentures or  partials may not be worn into surgery, please bring cases for these belongings.    For patients admitted to the hospital, discharge time will be determined by your treatment team.   Patients discharged the day of surgery will not be allowed to drive home, and someone needs to stay with them for 24 hours.  SURGICAL WAITING ROOM VISITATION Patients having surgery or a procedure may have two support people in the waiting room. These visitors may be switched out with other visitors if needed. Children under the age of 18 must have an adult accompany them who is not the patient. If the patient needs to stay at the hospital during part of their recovery, the visitor guidelines for inpatient rooms apply.  Please refer to the [facility] website for the visitor guidelines for Inpatients (after your surgery is over and you are in a regular room).    Special instructions:   Penny Thomas- Preparing For Surgery  Before surgery, you can play an important role. Because skin is not sterile, your skin needs to be as free of germs as possible. You can reduce the number of germs on your skin by washing with CHG (chlorahexidine gluconate) Soap before surgery.  CHG is an antiseptic cleaner which kills germs and bonds with the skin to continue killing germs even after washing.    Oral Hygiene is also important to reduce your risk of infection.  Remember - BRUSH YOUR TEETH THE MORNING OF SURGERY WITH  YOUR REGULAR TOOTHPASTE  Please do not use if you have an allergy to CHG or antibacterial soaps. If your skin becomes reddened/irritated stop using the CHG.  Do not shave (including legs and underarms) for at least 48 hours prior to first CHG shower. It is OK to shave your face.  Please follow these instructions carefully.   Shower the NIGHT BEFORE SURGERY and the MORNING OF SURGERY  If you chose to wash your hair, wash your hair first as usual with your normal shampoo.  After you shampoo, rinse your hair and body  thoroughly to remove the shampoo.  Use CHG Soap as you would any other liquid soap. You can apply CHG directly to the skin and wash gently with a scrungie or a clean washcloth.   Apply the CHG Soap to your body ONLY FROM THE NECK DOWN.  Do not use on open wounds or open sores. Avoid contact with your eyes, ears, mouth and genitals (private parts). Wash Face and genitals (private parts)  with your normal soap.   Wash thoroughly, paying special attention to the area where your surgery will be performed.  Thoroughly rinse your body with warm water from the neck down.  DO NOT shower/wash with your normal soap after using and rinsing off the CHG Soap.  Pat yourself dry with a CLEAN TOWEL.  Wear CLEAN PAJAMAS to bed the night before surgery  Place CLEAN SHEETS on your bed the night before your surgery  DO NOT SLEEP WITH PETS.   Day of Surgery: Take a shower with CHG soap. Do not wear jewelry or makeup Do not wear lotions, powders, perfumes, or deodorant. Do not shave 48 hours prior to surgery.  Do not bring valuables to the hospital.  [facility] is not responsible for any belongings or valuables. Do not wear nail polish, gel polish, artificial nails, or any other type of covering on natural nails (fingers and toes) If you have artificial nails or gel coating that need to be removed by a nail salon, please have this removed prior to surgery. Artificial nails or gel coating may interfere with anesthesia's ability to adequately monitor your vital signs. Wear Clean/Comfortable clothing the morning of surgery Remember to brush your teeth WITH YOUR REGULAR TOOTHPASTE.   Please read over the following fact sheets that you were given.    If you received a COVID test during your pre-op visit  it is requested that you wear a mask when out in public, stay away from anyone that may not be feeling well and notify your surgeon if you develop symptoms. If you have been in contact with anyone that  has tested positive in the last 10 days please notify you surgeon.

## 2021-11-06 ENCOUNTER — Encounter (HOSPITAL_COMMUNITY)
Admission: RE | Admit: 2021-11-06 | Discharge: 2021-11-06 | Disposition: A | Discharge status: Home / Self Care | Payer: BC Managed Care – PPO | Source: Ambulatory Visit | Attending: Specialist | Admitting: Specialist

## 2021-11-06 ENCOUNTER — Other Ambulatory Visit: Payer: Self-pay

## 2021-11-06 ENCOUNTER — Encounter (HOSPITAL_COMMUNITY): Payer: Self-pay

## 2021-11-06 DIAGNOSIS — Z01818 Encounter for other preprocedural examination: Secondary | ICD-10-CM | POA: Insufficient documentation

## 2021-11-06 LAB — SURGICAL PCR SCREEN
MRSA, PCR: NEGATIVE
Staphylococcus aureus: NEGATIVE

## 2021-11-06 NOTE — Progress Notes (Signed)
PCP - Dr. Francis Gaines, PA-C Cardiologist - denies  PPM/ICD - denies Device Orders - n/a Rep Notified - n/a  Chest x-ray - n/a EKG - 11/06/2021 Stress Test - denies ECHO - denies Cardiac Cath - denies  Sleep Study - denies CPAP - n/a  Pre-Diabetic. Last A1C 5.4 on 10/24/21. Pt has meter at home and checks her blood sugar if she feels symptomatic, which is about once every six months.  Blood Thinner Instructions: n/a Aspirin Instructions: n/a  ERAS Protcol - Yes. Clear liquids until 0430 morning of surgery PRE-SURGERY Ensure or G2- Ensure given to patient at PAT visit  COVID TEST- n/a   Anesthesia review: Yes. Pt received medical clearance from her PCP Francis Gaines, PA-C. Clearance note available in Epic. And abnormal ECG.  Patient denies shortness of breath, fever, cough and chest pain at PAT appointment   All instructions explained to the patient, with a verbal understanding of the material. Patient agrees to go over the instructions while at home for a better understanding. Patient also instructed to self quarantine after being tested for COVID-19. The opportunity to ask questions was provided.

## 2021-11-07 NOTE — Progress Notes (Signed)
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  History includes never smoker, sickle cell trait, anemia, HTN, prediabetes, asthma, migraines, hysterectomy (12/01/2019).  She was medically cleared for surgery following 10/24/21 visit with Francis Gaines B, PA-C.  EKG appears stable. Labs done on 10/24/21. Anesthesia team to evaluate on the day of surgery.   VS: BP 112/67   Pulse 84   Temp 36.7 C (Oral)   Resp 17   Ht '5\' 7"'$  (1.702 m)   Wt 84.8 kg   LMP 11/29/2015   SpO2 100%   BMI 29.29 kg/m   PROVIDERS: Irene Pap, PA-C is PCP  Metta Clines, DO is neurologist   LABS: Most recent lab results include: Lab Results  Component Value Date   WBC 5.3 10/24/2021   HGB 12.6 10/24/2021   HCT 37.5 10/24/2021   PLT 245 10/24/2021   GLUCOSE 115 (H) 10/24/2021   ALT 16 10/24/2021   AST 22 10/24/2021   NA 142 10/24/2021   K 4.0 10/24/2021   CL 102 10/24/2021   CREATININE 0.74 10/24/2021   BUN 10 10/24/2021   CO2 19 (L) 10/24/2021   HGBA1C 5.4 10/24/2021    Spirometry 11/13/20: FVC 3.25, FEV1 3.00.  Predicted FVC 3.49, predicted FEV1 2.91.  Spirometry indicates normal ventilatory function.    IMAGES: MRI L-spine 08/21/21: IMPRESSION: 1. Shallow central disc protrusion with probable tiny extruded disc component at L5-S1, closely approximating and/or potentially irritating either of the descending S1 nerve roots as they course through the lateral recesses. 2. Left eccentric disc bulge with facet hypertrophy at L4-5 with resultant mild left foraminal and left lateral recess stenosis. 3. Small left foraminal disc protrusion at L2-3 without  stenosis or impingement.   MRI/MRA Brain 06/15/21: IMPRESSION: MRI brain: Unremarkable non-contrast MRI appearance of the brain. No evidence of acute intracranial abnormality.   MRA head: 1. Unremarkable examination. No intracranial large vessel occlusion or proximal high-grade arterial stenosis. 2. No intracranial aneurysm is identified.      EKG: 11/06/21: Sinus rhythm with short PR Nonspecific T wave abnormality - EKG appears stable when compared to 11/19/19 preoperative tracing for hysterectomy. Inferior leads III and aVF continue to be mildly negative and PR 110 ms.   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   Postoperative state 12/01/2019   Pre-diabetes    Recurrent upper respiratory infection (URI)    Seasonal allergies    Sickle cell anemia (H[facility])    trait   Sickle cell trait ([place])     Past  Surgical History:  Procedure Laterality Date   ABDOMINAL HYSTERECTOMY     CESAREAN SECTION  09/28/2005   CESAREAN SECTION  01/20/2009   CESAREAN SECTION     DIAGNOSTIC LAPAROSCOPY     ENDOMETRIAL ABLATION  10/2011   HEROPTIOIN   HERNIA REPAIR     bilateral groins as a child   KNEE ARTHROSCOPY Right 02/16/2014   Procedure: RIGHT ARTHROSCOPY KNEE;  Surgeon: Kerin Salen, MD;  Location: [place];  Service: Orthopedics;  Laterality: Right;   POLYP REMOVAL  09/2006   ?CERVICAL OR UTERINE POLYP   ROBOTIC ASSISTED LAPAROSCOPIC HYSTERECTOMY AND  SALPINGECTOMY Bilateral 12/01/2019   Procedure: XI ROBOTIC ASSISTEDTOTAL TOTAL LAPAROSCOPIC HYSTERECTOMY AND RIGHT SALPINGECTOMY , EXTENSIVE LYSIS OF ADHESIONS;  Surgeon: [name], MD;  Location: [place];  Service: Gynecology;  Laterality: Bilateral;  request 8:30am OR time. Requests 2 hours   TUBAL LIGATION  01/20/2009   DONE WITH C-SECTION   WISDOM TOOTH EXTRACTION Right 2014    MEDICATIONS:  albuterol (VENTOLIN HFA) 108 (90 Base) MCG/ACT inhaler   ALVESCO 160 MCG/ACT inhaler   Bacillus Coagulans-Inulin (PROBIOTIC-PREBIOTIC PO)   beclomethasone (QVAR) 80 MCG/ACT inhaler   BIOTIN PO   cetirizine (ZYRTEC) 10 MG tablet   famotidine (PEPCID) 20 MG tablet   fexofenadine (ALLEGRA) 180 MG tablet   fluticasone (FLONASE) 50 MCG/ACT nasal spray   Multiple Vitamin (MULTIVITAMIN WITH MINERALS) TABS tablet   naproxen (NAPROSYN) 500 MG tablet   propranolol ER (INDERAL LA) 120 MG 24 hr capsule   simethicone (MYLICON) 144 MG chewable tablet   SUMAtriptan 6 MG/0.5ML SOAJ   traMADol (ULTRAM) 50 MG tablet   traMADol (ULTRAM) 50 MG tablet   No current facility-administered medications for this encounter.    Myra Gianotti, PA-C Surgical Short Stay/Anesthesiology [facility] Phone [phone] [facility] Phone [phone] 11/07/2021 12:18 PM

## 2021-11-07 NOTE — Anesthesia Preprocedure Evaluation (Addendum)
Anesthesia Evaluation  Patient identified by MRN, date of birth, ID band Patient awake    Reviewed: Allergy & Precautions, NPO status , Patient's Chart, lab work & pertinent test results  Airway Mallampati: II  TM Distance: >3 FB Neck ROM: Full    Dental no notable dental hx. (+) Teeth Intact, Dental Advisory Given   Pulmonary asthma ,    Pulmonary exam normal breath sounds clear to auscultation       Cardiovascular hypertension, Pt. on medications negative cardio ROS Normal cardiovascular exam Rhythm:Regular Rate:Normal     Neuro/Psych  Headaches, Anxiety negative psych ROS   GI/Hepatic negative GI ROS, Neg liver ROS,   Endo/Other  negative endocrine ROS  Renal/GU negative Renal ROS  negative genitourinary   Musculoskeletal  (+) Arthritis , Osteoarthritis,    Abdominal   Peds negative pediatric ROS (+)  Hematology  (+) Blood dyscrasia, Sickle cell trait ,   Anesthesia Other Findings   Reproductive/Obstetrics negative OB ROS                           Anesthesia Physical Anesthesia Plan  ASA: 2  Anesthesia Plan: General   Post-op Pain Management: Dilaudid IV, Ketamine IV*, Lidocaine infusion* and Ofirmev IV (intra-op)*   Induction: Intravenous  PONV Risk Score and Plan: 3 and Ondansetron, Dexamethasone, Midazolam and Treatment may vary due to age or medical condition  Airway Management Planned: Oral ETT  Additional Equipment:   Intra-op Plan:   Post-operative Plan: Extubation in OR  Informed Consent: I have reviewed the patients History and Physical, chart, labs and discussed the procedure including the risks, benefits and alternatives for the proposed anesthesia with the patient or authorized representative who has indicated his/her understanding and acceptance.     Dental advisory given  Plan Discussed with: CRNA  Anesthesia Plan Comments: (PAT note written 11/07/2021 by  Myra Gianotti, PA-C. )       Anesthesia Quick Evaluation

## 2021-11-08 ENCOUNTER — Encounter: Payer: Self-pay | Admitting: Surgery

## 2021-11-08 ENCOUNTER — Ambulatory Visit: Payer: BC Managed Care – PPO | Admitting: Surgery

## 2021-11-08 VITALS — BP 105/72 | HR 78 | Ht 67.0 in | Wt 187.0 lb

## 2021-11-08 DIAGNOSIS — M5416 Radiculopathy, lumbar region: Secondary | ICD-10-CM

## 2021-11-08 NOTE — Progress Notes (Signed)
35 year old black female history of L5-S1 HNP comes in for preop evaluation.  States that symptoms unchanged from previous visit.  She is wanting to proceed with bilateral L5-S1 microdiscectomy as scheduled.  Today history physical performed.  Review of systems negative.  Surgical procedure discussed along potential recovery time.  All questions answered.

## 2021-11-09 ENCOUNTER — Telehealth: Payer: Self-pay | Admitting: Specialist

## 2021-11-09 NOTE — H&P (Signed)
Penny Thomas is an 36 y.o. female.   Chief Complaint: Low back pain lower extremity radiculopathy HPI: 36 year old black female history of L5-S1 HNP comes in for preop evaluation.  States that symptoms unchanged from previous visit.  She is wanting to proceed with bilateral L5-S1 microdiscectomy as scheduled.  Today history physical performed.  Review of systems negative.   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   Postoperative state 12/01/2019   Pre-diabetes    Recurrent upper respiratory infection (URI)    Seasonal allergies    Sickle cell anemia ([place])    trait   Sickle cell trait ([place])     Past Surgical History:  Procedure Laterality Date   ABDOMINAL HYSTERECTOMY     CESAREAN SECTION  09/28/2005   CESAREAN SECTION  01/20/2009   CESAREAN SECTION     DIAGNOSTIC LAPAROSCOPY     ENDOMETRIAL ABLATION  10/2011   HEROPTIOIN   HERNIA REPAIR     bilateral groins as a child   KNEE ARTHROSCOPY Right 02/16/2014   Procedure: RIGHT ARTHROSCOPY KNEE;  Surgeon: Kerin Salen, MD;  Location: [place];  Service: Orthopedics;  Laterality: Right;   POLYP REMOVAL  09/2006   ?CERVICAL OR UTERINE POLYP   ROBOTIC ASSISTED LAPAROSCOPIC HYSTERECTOMY AND SALPINGECTOMY Bilateral 12/01/2019   Procedure: XI ROBOTIC ASSISTEDTOTAL TOTAL LAPAROSCOPIC HYSTERECTOMY AND RIGHT SALPINGECTOMY , EXTENSIVE LYSIS OF ADHESIONS;  Surgeon: [name], MD;  Location: [place];  Service: Gynecology;  Laterality: Bilateral;  request 8:30am OR time. Requests 2 hours   TUBAL LIGATION  01/20/2009   DONE WITH C-SECTION   WISDOM TOOTH EXTRACTION Right 2014    Family History  Problem Relation Age of Onset   Fibromyalgia Mother    Multiple sclerosis Mother    Asthma Father    Healthy Maternal Grandmother    Healthy Maternal Grandfather    Healthy Paternal Grandmother    Healthy Paternal Grandfather    Endometriosis Sister    Healthy Brother    Healthy Child    Social History:  reports that she has never smoked. She has never used smokeless tobacco. She reports current alcohol use. She reports that she does not use drugs.  Allergies:  Allergies  Allergen Reactions   [name] [Elagolix] Other (See Comments)    Hallucinations    No medications prior to admission.    No results found for this or any previous visit (from the past 48 hour(s)). No results found.  Review of Systems  Constitutional:  Positive for activity change.  HENT: Negative.    Respiratory: Negative.    Cardiovascular: Negative.   Gastrointestinal: Negative.   Genitourinary: Negative.   Musculoskeletal:  Positive for back pain.  Neurological:  Positive for numbness.  Psychiatric/Behavioral: Negative.      Last menstrual period 11/29/2015. Physical Exam HENT:     Head: Normocephalic and atraumatic.  Nose: Nose normal.  Eyes:     Extraocular Movements: Extraocular movements intact.  Cardiovascular:     Rate and Rhythm: Normal rate and regular rhythm.     Heart sounds: No murmur heard. Pulmonary:     Effort: No respiratory distress.  Abdominal:     General: Bowel sounds are normal. There is no distension.     Tenderness: There is no abdominal tenderness.  Neurological:     Mental Status: She is alert and oriented to person, place, and time.  Psychiatric:        Mood and Affect: Mood normal.      Assessment/Plan Bilateral L5-S1 HNP   We will  proceed with bilateral L5-S1 microdiscectomy as scheduled.  Surgical procedure discussed.  All questions answered.  Benjiman Core, PA-C 11/09/2021, 5:20 PM

## 2021-11-09 NOTE — Telephone Encounter (Signed)
Received $25.00 cash from patient/Forwarding to [employer] today 

## 2021-11-12 ENCOUNTER — Ambulatory Visit (HOSPITAL_COMMUNITY): Payer: BC Managed Care – PPO

## 2021-11-12 ENCOUNTER — Ambulatory Visit (HOSPITAL_COMMUNITY): Payer: BC Managed Care – PPO | Admitting: Anesthesiology

## 2021-11-12 ENCOUNTER — Encounter (HOSPITAL_COMMUNITY)
Admission: RE | Disposition: A | Discharge status: Home Health | Payer: Self-pay | Source: Ambulatory Visit | Attending: Specialist

## 2021-11-12 ENCOUNTER — Ambulatory Visit (HOSPITAL_COMMUNITY): Payer: BC Managed Care – PPO | Admitting: Vascular Surgery

## 2021-11-12 ENCOUNTER — Other Ambulatory Visit: Payer: Self-pay

## 2021-11-12 ENCOUNTER — Encounter (HOSPITAL_COMMUNITY): Payer: Self-pay | Admitting: Specialist

## 2021-11-12 ENCOUNTER — Observation Stay (HOSPITAL_COMMUNITY)
Admission: RE | Admit: 2021-11-12 | Discharge: 2021-11-13 | Disposition: A | Discharge status: Home Health | Payer: BC Managed Care – PPO | Source: Ambulatory Visit | Attending: Specialist | Admitting: Specialist

## 2021-11-12 DIAGNOSIS — Z8616 Personal history of COVID-19: Secondary | ICD-10-CM | POA: Diagnosis not present

## 2021-11-12 DIAGNOSIS — M5127 Other intervertebral disc displacement, lumbosacral region: Secondary | ICD-10-CM | POA: Diagnosis present

## 2021-11-12 DIAGNOSIS — M4807 Spinal stenosis, lumbosacral region: Secondary | ICD-10-CM | POA: Insufficient documentation

## 2021-11-12 DIAGNOSIS — I10 Essential (primary) hypertension: Secondary | ICD-10-CM | POA: Insufficient documentation

## 2021-11-12 DIAGNOSIS — Z9889 Other specified postprocedural states: Secondary | ICD-10-CM

## 2021-11-12 DIAGNOSIS — Z01818 Encounter for other preprocedural examination: Secondary | ICD-10-CM

## 2021-11-12 DIAGNOSIS — J45909 Unspecified asthma, uncomplicated: Secondary | ICD-10-CM | POA: Diagnosis not present

## 2021-11-12 HISTORY — PX: LUMBAR LAMINECTOMY/DECOMPRESSION MICRODISCECTOMY: SHX5026

## 2021-11-12 SURGERY — LUMBAR LAMINECTOMY/DECOMPRESSION MICRODISCECTOMY
Anesthesia: General | Site: Spine Lumbar

## 2021-11-12 MED ORDER — PROPOFOL 10 MG/ML IV BOLUS
INTRAVENOUS | Status: AC
  Filled 2021-11-12: qty 20

## 2021-11-12 MED ORDER — LACTATED RINGERS IV SOLN
INTRAVENOUS | Status: DC
Start: 2021-11-12 — End: 2021-11-12

## 2021-11-12 MED ORDER — AMISULPRIDE (ANTIEMETIC) 5 MG/2ML IV SOLN: 10.0000 mg | Freq: Once | INTRAVENOUS | Status: DC | PRN

## 2021-11-12 MED ORDER — EPHEDRINE 5 MG/ML INJ
INTRAVENOUS | Status: AC
  Filled 2021-11-12: qty 5

## 2021-11-12 MED ORDER — BUPIVACAINE LIPOSOME 1.3 % IJ SUSP
INTRAMUSCULAR | Status: DC | PRN
  Administered 2021-11-12: 28 mL via INTRAMUSCULAR

## 2021-11-12 MED ORDER — FLUTICASONE PROPIONATE 50 MCG/ACT NA SUSP: 2.0000 | Freq: Every day | NASAL | Status: DC | PRN

## 2021-11-12 MED ORDER — BIOTIN 300 MCG PO TABS: ORAL_TABLET | Freq: Every day | ORAL | Status: DC

## 2021-11-12 MED ORDER — 0.9 % SODIUM CHLORIDE (POUR BTL) OPTIME
TOPICAL | Status: DC | PRN
  Administered 2021-11-12: 1000 mL

## 2021-11-12 MED ORDER — MIDAZOLAM HCL 2 MG/2ML IJ SOLN
INTRAMUSCULAR | Status: AC
Start: 2021-11-12 — End: ?
  Filled 2021-11-12: qty 2

## 2021-11-12 MED ORDER — KETAMINE HCL 10 MG/ML IJ SOLN
INTRAMUSCULAR | Status: DC | PRN
  Administered 2021-11-12: 30 mg via INTRAVENOUS

## 2021-11-12 MED ORDER — HYDROMORPHONE HCL 1 MG/ML IJ SOLN
0.2500 mg | INTRAMUSCULAR | Status: DC | PRN
  Administered 2021-11-12: 0.25 mg via INTRAVENOUS
  Administered 2021-11-12: 0.5 mg via INTRAVENOUS
  Administered 2021-11-12 (×2): 0.25 mg via INTRAVENOUS

## 2021-11-12 MED ORDER — PROBIOTIC-PREBIOTIC 1-250 BILLION-MG PO CAPS: ORAL_CAPSULE | Freq: Every day | ORAL | Status: DC

## 2021-11-12 MED ORDER — THROMBIN 20000 UNITS EX SOLR
CUTANEOUS | Status: DC | PRN
  Administered 2021-11-12: 20 mL via TOPICAL

## 2021-11-12 MED ORDER — SODIUM CHLORIDE 0.9 % IV SOLN: INTRAVENOUS | Status: DC

## 2021-11-12 MED ORDER — ACETAMINOPHEN 10 MG/ML IV SOLN
INTRAVENOUS | Status: DC | PRN
  Administered 2021-11-12: 1000 mg via INTRAVENOUS

## 2021-11-12 MED ORDER — PROPRANOLOL HCL ER 60 MG PO CP24
120.0000 mg | ORAL_CAPSULE | Freq: Every day | ORAL | Status: DC
  Administered 2021-11-12: 120 mg via ORAL
  Filled 2021-11-12 (×2): qty 2
  Filled 2021-11-12 (×2): qty 1

## 2021-11-12 MED ORDER — MIDAZOLAM HCL 5 MG/5ML IJ SOLN
INTRAMUSCULAR | Status: DC | PRN
  Administered 2021-11-12: 2 mg via INTRAVENOUS

## 2021-11-12 MED ORDER — FENTANYL CITRATE (PF) 250 MCG/5ML IJ SOLN
INTRAMUSCULAR | Status: DC | PRN
  Administered 2021-11-12: 50 ug via INTRAVENOUS
  Administered 2021-11-12: 100 ug via INTRAVENOUS

## 2021-11-12 MED ORDER — FAMOTIDINE 20 MG PO TABS
20.0000 mg | ORAL_TABLET | Freq: Two times a day (BID) | ORAL | Status: DC
  Administered 2021-11-12 – 2021-11-13 (×3): 20 mg via ORAL
  Filled 2021-11-12 (×3): qty 1

## 2021-11-12 MED ORDER — TRAMADOL HCL 50 MG PO TABS: 50.0000 mg | ORAL_TABLET | Freq: Three times a day (TID) | ORAL | Status: DC | PRN

## 2021-11-12 MED ORDER — NAPROXEN 375 MG PO TABS
375.0000 mg | ORAL_TABLET | Freq: Two times a day (BID) | ORAL | Status: DC
  Filled 2021-11-12: qty 1

## 2021-11-12 MED ORDER — ACETAMINOPHEN 650 MG RE SUPP: 650.0000 mg | RECTAL | Status: DC | PRN

## 2021-11-12 MED ORDER — METHOCARBAMOL 1000 MG/10ML IJ SOLN
500.0000 mg | Freq: Four times a day (QID) | INTRAVENOUS | Status: DC | PRN
  Filled 2021-11-12: qty 5

## 2021-11-12 MED ORDER — SODIUM CHLORIDE 0.9% FLUSH
3.0000 mL | Freq: Two times a day (BID) | INTRAVENOUS | Status: DC
  Administered 2021-11-12 (×2): 3 mL via INTRAVENOUS

## 2021-11-12 MED ORDER — ACETAMINOPHEN 10 MG/ML IV SOLN
INTRAVENOUS | Status: AC
  Filled 2021-11-12: qty 100

## 2021-11-12 MED ORDER — OXYCODONE HCL 5 MG PO TABS: 5.0000 mg | ORAL_TABLET | Freq: Once | ORAL | Status: AC | PRN

## 2021-11-12 MED ORDER — PHENYLEPHRINE 80 MCG/ML (10ML) SYRINGE FOR IV PUSH (FOR BLOOD PRESSURE SUPPORT)
PREFILLED_SYRINGE | INTRAVENOUS | Status: DC | PRN
  Administered 2021-11-12 (×3): 80 ug via INTRAVENOUS

## 2021-11-12 MED ORDER — CHLORHEXIDINE GLUCONATE 0.12 % MT SOLN
15.0000 mL | Freq: Once | OROMUCOSAL | Status: AC
  Administered 2021-11-12: 15 mL via OROMUCOSAL
  Filled 2021-11-12: qty 15

## 2021-11-12 MED ORDER — POLYETHYLENE GLYCOL 3350 17 G PO PACK: 17.0000 g | PACK | Freq: Every day | ORAL | Status: DC | PRN

## 2021-11-12 MED ORDER — ACETAMINOPHEN 325 MG PO TABS: 650.0000 mg | ORAL_TABLET | ORAL | Status: DC | PRN

## 2021-11-12 MED ORDER — HYDROMORPHONE HCL 1 MG/ML IJ SOLN
INTRAMUSCULAR | Status: AC
  Filled 2021-11-12: qty 1

## 2021-11-12 MED ORDER — DOCUSATE SODIUM 100 MG PO CAPS
100.0000 mg | ORAL_CAPSULE | Freq: Two times a day (BID) | ORAL | Status: DC
Start: 2021-11-12 — End: 2021-11-13
  Administered 2021-11-12 – 2021-11-13 (×2): 100 mg via ORAL
  Filled 2021-11-12 (×2): qty 1

## 2021-11-12 MED ORDER — BUPIVACAINE HCL (PF) 0.5 % IJ SOLN
INTRAMUSCULAR | Status: AC
  Filled 2021-11-12: qty 30

## 2021-11-12 MED ORDER — MENTHOL 3 MG MT LOZG
1.0000 | LOZENGE | OROMUCOSAL | Status: DC | PRN
  Filled 2021-11-12: qty 9

## 2021-11-12 MED ORDER — CEFAZOLIN SODIUM-DEXTROSE 2-4 GM/100ML-% IV SOLN
2.0000 g | INTRAVENOUS | Status: AC
  Administered 2021-11-12: 2 g via INTRAVENOUS
  Filled 2021-11-12: qty 100

## 2021-11-12 MED ORDER — HYDROCODONE-ACETAMINOPHEN 10-325 MG PO TABS
1.0000 | ORAL_TABLET | ORAL | Status: DC | PRN
  Administered 2021-11-12 – 2021-11-13 (×3): 1 via ORAL
  Filled 2021-11-12 (×3): qty 1

## 2021-11-12 MED ORDER — HYDROCODONE-ACETAMINOPHEN 7.5-325 MG PO TABS
2.0000 | ORAL_TABLET | ORAL | Status: DC | PRN
  Administered 2021-11-13 (×2): 2 via ORAL
  Filled 2021-11-12 (×2): qty 2

## 2021-11-12 MED ORDER — MEPERIDINE HCL 25 MG/ML IJ SOLN: 6.2500 mg | INTRAMUSCULAR | Status: DC | PRN

## 2021-11-12 MED ORDER — ROCURONIUM BROMIDE 10 MG/ML (PF) SYRINGE
PREFILLED_SYRINGE | INTRAVENOUS | Status: DC | PRN
  Administered 2021-11-12: 80 mg via INTRAVENOUS

## 2021-11-12 MED ORDER — ZOLPIDEM TARTRATE 5 MG PO TABS: 5.0000 mg | ORAL_TABLET | Freq: Every evening | ORAL | Status: DC | PRN

## 2021-11-12 MED ORDER — ORAL CARE MOUTH RINSE: 15.0000 mL | Freq: Once | OROMUCOSAL | Status: AC

## 2021-11-12 MED ORDER — LORATADINE 10 MG PO TABS
10.0000 mg | ORAL_TABLET | Freq: Every day | ORAL | Status: DC
  Administered 2021-11-12 – 2021-11-13 (×2): 10 mg via ORAL
  Filled 2021-11-12 (×2): qty 1

## 2021-11-12 MED ORDER — BUPIVACAINE LIPOSOME 1.3 % IJ SUSP: 10.0000 mL | Freq: Once | INTRAMUSCULAR | Status: DC

## 2021-11-12 MED ORDER — KETAMINE HCL 50 MG/5ML IJ SOSY
PREFILLED_SYRINGE | INTRAMUSCULAR | Status: AC
  Filled 2021-11-12: qty 5

## 2021-11-12 MED ORDER — CEFAZOLIN SODIUM-DEXTROSE 2-4 GM/100ML-% IV SOLN
2.0000 g | Freq: Three times a day (TID) | INTRAVENOUS | Status: AC
  Administered 2021-11-12 – 2021-11-13 (×2): 2 g via INTRAVENOUS
  Filled 2021-11-12 (×2): qty 100

## 2021-11-12 MED ORDER — LACTATED RINGERS IV SOLN: INTRAVENOUS | Status: DC | PRN

## 2021-11-12 MED ORDER — SUGAMMADEX SODIUM 500 MG/5ML IV SOLN
INTRAVENOUS | Status: AC
Start: 2021-11-12 — End: ?
  Filled 2021-11-12: qty 5

## 2021-11-12 MED ORDER — DEXAMETHASONE SODIUM PHOSPHATE 10 MG/ML IJ SOLN
INTRAMUSCULAR | Status: DC | PRN
  Administered 2021-11-12: 5 mg via INTRAVENOUS

## 2021-11-12 MED ORDER — BUDESONIDE 0.5 MG/2ML IN SUSP
0.5000 mg | Freq: Two times a day (BID) | RESPIRATORY_TRACT | Status: DC
  Administered 2021-11-12: 0.5 mg via RESPIRATORY_TRACT
  Filled 2021-11-12: qty 2

## 2021-11-12 MED ORDER — TRAMADOL HCL 50 MG PO TABS: 100.0000 mg | ORAL_TABLET | Freq: Four times a day (QID) | ORAL | Status: DC | PRN

## 2021-11-12 MED ORDER — ONDANSETRON HCL 4 MG/2ML IJ SOLN
INTRAMUSCULAR | Status: AC
  Filled 2021-11-12: qty 2

## 2021-11-12 MED ORDER — BUPIVACAINE LIPOSOME 1.3 % IJ SUSP
INTRAMUSCULAR | Status: AC
Start: 2021-11-12 — End: ?
  Filled 2021-11-12: qty 20

## 2021-11-12 MED ORDER — ADULT MULTIVITAMIN W/MINERALS CH
1.0000 | ORAL_TABLET | Freq: Every day | ORAL | Status: DC
  Administered 2021-11-12 – 2021-11-13 (×2): 1 via ORAL
  Filled 2021-11-12 (×2): qty 1

## 2021-11-12 MED ORDER — PROMETHAZINE HCL 25 MG/ML IJ SOLN: 6.2500 mg | INTRAMUSCULAR | Status: DC | PRN

## 2021-11-12 MED ORDER — ONDANSETRON HCL 4 MG/2ML IJ SOLN: 4.0000 mg | Freq: Four times a day (QID) | INTRAMUSCULAR | Status: DC | PRN

## 2021-11-12 MED ORDER — SUGAMMADEX SODIUM 200 MG/2ML IV SOLN
INTRAVENOUS | Status: DC | PRN
  Administered 2021-11-12: 200 mg via INTRAVENOUS

## 2021-11-12 MED ORDER — OXYCODONE HCL 5 MG/5ML PO SOLN
ORAL | Status: AC
  Filled 2021-11-12: qty 5

## 2021-11-12 MED ORDER — PROPOFOL 10 MG/ML IV BOLUS
INTRAVENOUS | Status: DC | PRN
  Administered 2021-11-12: 150 mg via INTRAVENOUS

## 2021-11-12 MED ORDER — ALBUTEROL SULFATE (2.5 MG/3ML) 0.083% IN NEBU: 3.0000 mL | INHALATION_SOLUTION | RESPIRATORY_TRACT | Status: DC | PRN

## 2021-11-12 MED ORDER — ROCURONIUM BROMIDE 10 MG/ML (PF) SYRINGE
PREFILLED_SYRINGE | INTRAVENOUS | Status: AC
  Filled 2021-11-12: qty 10

## 2021-11-12 MED ORDER — FLEET ENEMA 7-19 GM/118ML RE ENEM: 1.0000 | ENEMA | Freq: Once | RECTAL | Status: DC | PRN

## 2021-11-12 MED ORDER — BISACODYL 5 MG PO TBEC: 5.0000 mg | DELAYED_RELEASE_TABLET | Freq: Every day | ORAL | Status: DC | PRN

## 2021-11-12 MED ORDER — OXYCODONE HCL 5 MG/5ML PO SOLN
5.0000 mg | Freq: Once | ORAL | Status: AC | PRN
  Administered 2021-11-12: 5 mg via ORAL

## 2021-11-12 MED ORDER — SIMETHICONE 80 MG PO CHEW
125.0000 mg | CHEWABLE_TABLET | Freq: Two times a day (BID) | ORAL | Status: DC
  Administered 2021-11-12 – 2021-11-13 (×2): 120 mg via ORAL
  Filled 2021-11-12 (×3): qty 2

## 2021-11-12 MED ORDER — METHOCARBAMOL 500 MG PO TABS
500.0000 mg | ORAL_TABLET | Freq: Four times a day (QID) | ORAL | Status: DC | PRN
  Administered 2021-11-12 – 2021-11-13 (×3): 500 mg via ORAL
  Filled 2021-11-12 (×3): qty 1

## 2021-11-12 MED ORDER — LIDOCAINE 2% (20 MG/ML) 5 ML SYRINGE
INTRAMUSCULAR | Status: DC | PRN
  Administered 2021-11-12: 80 mg via INTRAVENOUS

## 2021-11-12 MED ORDER — LIDOCAINE 2% (20 MG/ML) 5 ML SYRINGE
INTRAMUSCULAR | Status: AC
  Filled 2021-11-12: qty 5

## 2021-11-12 MED ORDER — ALUM & MAG HYDROXIDE-SIMETH 200-200-20 MG/5ML PO SUSP: 30.0000 mL | Freq: Four times a day (QID) | ORAL | Status: DC | PRN

## 2021-11-12 MED ORDER — PHENYLEPHRINE 80 MCG/ML (10ML) SYRINGE FOR IV PUSH (FOR BLOOD PRESSURE SUPPORT)
PREFILLED_SYRINGE | INTRAVENOUS | Status: AC
  Filled 2021-11-12: qty 10

## 2021-11-12 MED ORDER — ONDANSETRON HCL 4 MG PO TABS: 4.0000 mg | ORAL_TABLET | Freq: Four times a day (QID) | ORAL | Status: DC | PRN

## 2021-11-12 MED ORDER — FENTANYL CITRATE (PF) 250 MCG/5ML IJ SOLN
INTRAMUSCULAR | Status: AC
  Filled 2021-11-12: qty 5

## 2021-11-12 MED ORDER — ONDANSETRON HCL 4 MG/2ML IJ SOLN
INTRAMUSCULAR | Status: DC | PRN
  Administered 2021-11-12: 4 mg via INTRAVENOUS

## 2021-11-12 MED ORDER — GABAPENTIN 100 MG PO CAPS
200.0000 mg | ORAL_CAPSULE | Freq: Two times a day (BID) | ORAL | Status: DC
Start: 2021-11-12 — End: 2021-11-13
  Administered 2021-11-12 – 2021-11-13 (×3): 200 mg via ORAL
  Filled 2021-11-12 (×3): qty 2

## 2021-11-12 MED ORDER — KETOROLAC TROMETHAMINE 15 MG/ML IJ SOLN
7.5000 mg | Freq: Four times a day (QID) | INTRAMUSCULAR | Status: AC
  Administered 2021-11-12 – 2021-11-13 (×4): 7.5 mg via INTRAVENOUS
  Filled 2021-11-12 (×4): qty 1

## 2021-11-12 MED ORDER — SODIUM CHLORIDE 0.9% FLUSH: 3.0000 mL | INTRAVENOUS | Status: DC | PRN

## 2021-11-12 MED ORDER — SODIUM CHLORIDE 0.9 % IV SOLN: 250.0000 mL | INTRAVENOUS | Status: DC

## 2021-11-12 MED ORDER — HYDROCODONE-ACETAMINOPHEN 7.5-325 MG PO TABS
1.0000 | ORAL_TABLET | Freq: Four times a day (QID) | ORAL | Status: DC
  Administered 2021-11-12: 1 via ORAL
  Filled 2021-11-12: qty 1

## 2021-11-12 MED ORDER — THROMBIN 20000 UNITS EX SOLR
CUTANEOUS | Status: AC
  Filled 2021-11-12: qty 20000

## 2021-11-12 MED ORDER — PHENOL 1.4 % MT LIQD
1.0000 | OROMUCOSAL | Status: DC | PRN
Start: 2021-11-12 — End: 2021-11-13

## 2021-11-12 MED ORDER — DEXAMETHASONE SODIUM PHOSPHATE 10 MG/ML IJ SOLN
INTRAMUSCULAR | Status: AC
  Filled 2021-11-12: qty 1

## 2021-11-12 MED ORDER — MORPHINE SULFATE (PF) 2 MG/ML IV SOLN: 1.0000 mg | INTRAVENOUS | Status: DC | PRN

## 2021-11-12 MED ORDER — BUDESONIDE 0.25 MG/2ML IN SUSP: 0.2500 mg | Freq: Two times a day (BID) | RESPIRATORY_TRACT | Status: DC

## 2021-11-12 SURGICAL SUPPLY — 53 items
ADH SKN CLS APL DERMABOND .7 (GAUZE/BANDAGES/DRESSINGS)
BAG COUNTER SPONGE SURGICOUNT (BAG) ×2 IMPLANT
BAG SPNG CNTER NS LX DISP (BAG) ×1
BUR SABER RD CUTTING 3.0 (BURR) IMPLANT
CANISTER SUCT 3000ML PPV (MISCELLANEOUS) ×2 IMPLANT
COVER SURGICAL LIGHT HANDLE (MISCELLANEOUS) ×2 IMPLANT
DERMABOND ADVANCED (GAUZE/BANDAGES/DRESSINGS)
DERMABOND ADVANCED .7 DNX12 (GAUZE/BANDAGES/DRESSINGS) ×1 IMPLANT
DRAPE HALF SHEET 40X57 (DRAPES) IMPLANT
DRAPE INCISE IOBAN 66X45 STRL (DRAPES) IMPLANT
DRAPE MICROSCOPE LEICA (MISCELLANEOUS) ×2 IMPLANT
DRAPE SURG 17X23 STRL (DRAPES) ×6 IMPLANT
DRSG MEPILEX BORDER 4X4 (GAUZE/BANDAGES/DRESSINGS) IMPLANT
DRSG MEPILEX BORDER 4X8 (GAUZE/BANDAGES/DRESSINGS) ×1 IMPLANT
DURAPREP 26ML APPLICATOR (WOUND CARE) ×2 IMPLANT
ELECT REM PT RETURN 9FT ADLT (ELECTROSURGICAL) ×2
ELECTRODE REM PT RTRN 9FT ADLT (ELECTROSURGICAL) ×1 IMPLANT
EVACUATOR 1/8 PVC DRAIN (DRAIN) IMPLANT
GLOVE BIOGEL PI IND STRL 8 (GLOVE) ×1 IMPLANT
GLOVE BIOGEL PI INDICATOR 8 (GLOVE) ×1
GLOVE ECLIPSE 9.0 STRL (GLOVE) ×2 IMPLANT
GLOVE ORTHO TXT STRL SZ7.5 (GLOVE) ×2 IMPLANT
GLOVE SURG 8.5 LATEX PF (GLOVE) ×2 IMPLANT
GOWN STRL REUS W/ TWL LRG LVL3 (GOWN DISPOSABLE) ×1 IMPLANT
GOWN STRL REUS W/TWL 2XL LVL3 (GOWN DISPOSABLE) ×4 IMPLANT
GOWN STRL REUS W/TWL LRG LVL3 (GOWN DISPOSABLE) ×2
KIT BASIN OR (CUSTOM PROCEDURE TRAY) ×2 IMPLANT
KIT TURNOVER KIT B (KITS) ×2 IMPLANT
NDL SPNL 18GX3.5 QUINCKE PK (NEEDLE) ×2 IMPLANT
NEEDLE SPNL 18GX3.5 QUINCKE PK (NEEDLE) ×4 IMPLANT
NS IRRIG 1000ML POUR BTL (IV SOLUTION) ×2 IMPLANT
PACK LAMINECTOMY ORTHO (CUSTOM PROCEDURE TRAY) ×2 IMPLANT
PAD ARMBOARD 7.5X6 YLW CONV (MISCELLANEOUS) ×3 IMPLANT
PATTIES SURGICAL .5 X.5 (GAUZE/BANDAGES/DRESSINGS) IMPLANT
PATTIES SURGICAL .75X.75 (GAUZE/BANDAGES/DRESSINGS) IMPLANT
PATTIES SURGICAL 1X1 (DISPOSABLE) IMPLANT
SPONGE SURGIFOAM ABS GEL 100 (HEMOSTASIS) ×1 IMPLANT
SPONGE T-LAP 4X18 ~~LOC~~+RFID (SPONGE) IMPLANT
STAPLER VISISTAT 35W (STAPLE) ×1 IMPLANT
SUT VIC AB 0 CT1 27 (SUTURE) ×2
SUT VIC AB 0 CT1 27XBRD ANBCTR (SUTURE) IMPLANT
SUT VIC AB 1 CT1 27 (SUTURE)
SUT VIC AB 1 CT1 27XBRD ANBCTR (SUTURE) IMPLANT
SUT VIC AB 1 CTX 36 (SUTURE) ×2
SUT VIC AB 1 CTX36XBRD ANBCTRL (SUTURE) IMPLANT
SUT VIC AB 2-0 CT1 27 (SUTURE) ×2
SUT VIC AB 2-0 CT1 TAPERPNT 27 (SUTURE) IMPLANT
SUT VIC AB 3-0 X1 27 (SUTURE) ×2 IMPLANT
SUT VICRYL 0 UR6 27IN ABS (SUTURE) ×1 IMPLANT
TOWEL GREEN STERILE (TOWEL DISPOSABLE) ×2 IMPLANT
TOWEL GREEN STERILE FF (TOWEL DISPOSABLE) ×2 IMPLANT
TRAY FOLEY MTR SLVR 16FR STAT (SET/KITS/TRAYS/PACK) IMPLANT
WATER STERILE IRR 1000ML POUR (IV SOLUTION) ×1 IMPLANT

## 2021-11-12 NOTE — Transfer of Care (Signed)
Immediate Anesthesia Transfer of Care Note  Patient: Penny Thomas  Procedure(s) Performed: BILATERAL LUMBAR FIVE - SACRAL ONE MICRODISCECTOMY (Spine Lumbar)  Patient Location: PACU  Anesthesia Type:General  Level of Consciousness: oriented, sedated and patient cooperative  Airway & Oxygen Therapy: Patient Spontanous Breathing and Patient connected to nasal cannula oxygen  Post-op Assessment: Report given to RN and Post -op Vital signs reviewed and stable  Post vital signs: Reviewed  Last Vitals:  Vitals Value Taken Time  BP 126/74 11/12/21 0953  Temp    Pulse 83 11/12/21 0953  Resp 17 11/12/21 0953  SpO2 98 % 11/12/21 0953  Vitals shown include unvalidated device data.  Last Pain:  Vitals:   11/12/21 0601  TempSrc:   PainSc: 5       Patients Stated Pain Goal: 0 (08/06/79 1886)  Complications: No notable events documented.

## 2021-11-12 NOTE — Discharge Instructions (Signed)
    No lifting greater than 10 lbs. Avoid bending, stooping and twisting. Walk in house for first week them may start to get out slowly increasing distance up to one mile by 3 weeks post op. Keep incision dry for 3 days, may use tegaderm or similar water impervious dressing.  

## 2021-11-12 NOTE — Anesthesia Postprocedure Evaluation (Signed)
Anesthesia Post Note  Patient: Penny Thomas  Procedure(s) Performed: BILATERAL LUMBAR FIVE - SACRAL ONE MICRODISCECTOMY (Spine Lumbar)     Patient location during evaluation: PACU Anesthesia Type: General Level of consciousness: awake and alert Pain management: pain level controlled Vital Signs Assessment: post-procedure vital signs reviewed and stable Respiratory status: spontaneous breathing, nonlabored ventilation and respiratory function stable Cardiovascular status: blood pressure returned to baseline and stable Postop Assessment: no apparent nausea or vomiting Anesthetic complications: no   No notable events documented.  Last Vitals:  Vitals:   11/12/21 1055 11/12/21 1119  BP: 118/78 121/80  Pulse: 72 60  Resp: 13 18  Temp:  36.7 C  SpO2: 93% 100%    Last Pain:  Vitals:   11/12/21 1055  TempSrc:   PainSc: [place]rini [place]ugg[place]

## 2021-11-12 NOTE — Op Note (Signed)
11/12/2021  9:17 AM  PATIENT:  Penny Thomas  36 y.o. 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     COMPONENTS:   PROCEDURE:The patient was met in the holding area, and the appropriate left and right lumbar level L5-S1 identified and marked with "x" and my initials.The patient was then transported to OR and was placed under general anesthesia without difficulty. The patient received appropriate preoperative antibiotic prophylaxis. The patient after intubation atraumatically was transferred to the operating room table, prone position, Wilson frame, sliding OR table. All pressure points were well padded. The arms in 90-90 well-padded at the elbows. Standard prep with DuraPrep solution lower dorsal spine to the mid sacral segment. Draped in the usual manner iodine Vi-Drape was used. Time-out procedure was called and correct. An18-gauge spinal needle was then inserted at the expected L5-S1 level.  C-arm was draped sterilely to the field and used to identify the spinal needles positions. The needle was at the lower aspect of the posterior inner space of L4-5 Skin superior to this was then infiltrated with Marcaine 0.5% 1:1 Exparel total of 28 cc used. An incision approximately an inch inch and a half in length was then made through skin and subcutaneous layers in line with the expected midline just superior to the spinal needle entry point. Radiograph cross table obtained with a Kocher clamp at the  L5-S1 interspinous process and identified the L5-S1 level. An incision made into the bilateral lumbosacral fascia approximately an inch in length at L5-S1 .   Cobb elevator then used to carefully form subperiosteal movement of the hip paralumbar muscles off of the posterior lamina of the expected L5-S1 level. The depth measured off of the Cobb at about 60 mm and 60 mm Boss McCollough retractors placed on the scaffolding and guided over dilators down to and docking on the posterior aspect of the lamina at the expected L5-S1 level. The operating room microscope sterilely draped brought into the field. Under the operating room microscope, theL5-S1 interspace carefully debrided bilaterally the small amount of muscle attachment here and high-speed bur used to drill the medial aspect of the left inferior articular process of L5 approximately 10%.  3 mm Kerrison then used to enter the spinal canal over the superior aspect of the left S1 lamina carefully using the Kerrison to debris the attachment as a curet. Foraminotomy was then performed over the left S1 nerve root. The medial 10% superior articular process of S1 and then resected using 3 mm Kerrison. This allowed for identification of the thecal sac.  Under the operating room microscope, the right L5-S1 interspace carefully debrided bilaterally the small amount of muscle attachment here and high-speed bur used to drill the medial aspect of the right inferior articular process of L5 approximately 10%.  3 mm Kerrison then used to enter the spinal canal over the superior aspect of the right S1 lamina carefully using the Kerrison to debris the attachment as a curet. Foraminotomy was then performed over the right S1 nerve  root. The medial 10% superior articular process of S1 and then resected using 2 mm Kerrison. This allowed for identification of the thecal sac. Penfield 4 was then used to carefully mobilize the thecal sac medially and the S1 nerve root identified within  the lateral recess flattened ovePenfield 4 was then used to carefully mobilize the thecal sac medially and the S1 nerve root identified within the lateral recess flattened over the posterior aspect of the herniated disc. Carefully the lateral aspect of the S1 nerve root was identified and a Penfield 4 was used to mobilize the nerve medially such that the herniated disc was visible with microscope. Using a Penfield 4 for retraction and a 15 blade scalpel was used to incise the posterior longitudinal ligament within the lateral recess on the right side longitudinally. Disc material immediately extruded and this was removed using micropituitary rongeurs and nerve hook nerve root and then more easily able to be mobilized medially and retracted using a love retractor. Further foraminotomies was performed over the right L5 nerve root the nerve root was noted to be free without further compression. The nerve root able to be retracted along the medial aspect of the right S1 pedicle and disc material found to be subligamentous at this level was further resected current pituitary rongeurs. Ligamentum flavum was further debrided superiorly to the level L5-S1 disc. Had a moderate amount of further resection of the S1 lamina inferiorly was performed. With this then the disc space at L5-S1 was easily visualized and entry into the disc at the sided disc herniation was possible using a Penfield 4 intraoperative Lateral radiograph was used to identify the L5-S1 disc with the Penfield 4 In place just below the disc space.  Micropituitary was used to further debride this material superficially from the posterior aspect of the intervertebral disc is posterior lateral aspect of the disc. Small amount of further disc material was found subligamentous extending inferiorly from the disc this was removed using micropituitary rongeurs. Ligamentum flavum was debrided and lateral recess along the medial aspect L5-S1 facet no further  decompression was necessary. Ball tip nerve probe was then able to carefully palpate the neuroforamen for L5 and S1 finding these to be well decompressed.Attention then turned to the left L5-S1 interspace carefully debrided bilaterally the small amount of muscle attachment here and high-speed bur used to drill the medial aspect of the left inferior articular process of L5 approximately 10%.  3 mm Kerrison then used to enter the spinal canal over the superior aspect of the left S1 lamina carefully using the Kerrison to debris the attachment as a curet. Foraminotomy was then performed over the left S1 nerve root. The medial 10% superior articular process of S1 and then resected using 3 mm Kerrison. This allowed for identification of the thecal sac. Penfield 4 was then used to carefully mobilize the thecal sac medially and the S1 nerve root identified within the lateral recess flattened over the posterior aspect of the herniated disc on the left at L5-S1. Carefully the lateral aspect of the left S1 nerve root was identified and a Penfield 4 was used to mobilize the nerve medially such that the herniated disc was visible with microscope. Using a Penfield 4 for retraction and a 15 blade scalpel was used to incise the posterior longitudinal ligament within the lateral recess on the left side longitudinally. Disc material immediately extruded and this was removed using micropituitary rongeurs and nerve hook nerve root and then more easily able to be  mobilized medially and retracted using a love retractor. Further foraminotomies was performed over the left L5 nerve root the nerve root was noted to be free without further compression. The nerve root able to be retracted along the medial aspect of the left S1 pedicle and disc material found to be subligamentous at this level was further resected current pituitary rongeurs. Ligamentum flavum was further debrided superiorly to the level L5-S1 disc. Had a moderate amount of  further resection of the S1 lamina inferiorly was performed. With this then the disc space at L5-S1 was easily visualized and entry into the disc at the sided disc herniation was possible using a Penfield 4 intraoperative Lateral radiograph was used to identify the L5-S1 disc with the Penfield 4 In place just below the disc space.  Bleeding was then controlled using thrombin-soaked Gelfoam small cottonoids.  Small amount of bleeding within the soft tissue mass the laminotomy area was controlled using bipolar electrocautery. Irrigation was carried out using copious amounts of irrigant solution. All Gelfoam  were then removed. No significant active bleeding present at the time of removal. All instruments sponge counts were correct traction system was then carefully removed carefully rotating retractors with this withdrawal and only bipolar electrocautery of any small bleeders. Lumbodorsal fascia was then carefully approximated with interrupted #1 Vicryl sutures, UR 6 needle deep subcutaneous layers were approximated with interrupted 0 Vicryl sutures on UR 6 the appear subcutaneous layers approximated with interrupted 2-0 Vicryl sutures and the skin closed with stainless steel staples then Mepilex bandage applied. Patient was then carefully returned to supine position on a stretcher, reactivated and extubated. He was then returned to recovery room in satisfactory condition.  Benjiman Core, PA-C perform the duties of assistant surgeon during this case. he was present from the beginning of the case to the end of the case assisting in transfer the patient from his stretcher to the OR table and back to the stretcher at the end of the case. Assisted in careful retraction and suction of the laminectomy site delicate neural structures operating under the operating room microscope. He performed closure of the incision from the fascia to the skin applying the dressing.     Basil Dess 11/12/2021, 9:17 AM

## 2021-11-12 NOTE — Interval H&P Note (Signed)
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  The various methods of treatment have been discussed with the patient and family. After consideration of risks, benefits and other options for treatment, the patient has consented to  Procedure(s): BILATERAL L5-S1 MICRODISCECTOMY (N/A) as a surgical intervention.  The patient's history has been reviewed, patient examined, no change in status, stable for surgery.  I have reviewed the patient's chart and labs.  Questions were answered to the patient's satisfaction.     Basil Dess

## 2021-11-12 NOTE — Anesthesia Procedure Notes (Addendum)
Procedure Name: Intubation Date/Time: 11/12/2021 7:58 AM  Performed by: Jenne Campus, CRNAPre-anesthesia Checklist: Patient identified, Emergency Drugs available, Suction available and Patient being monitored Patient Re-evaluated:Patient Re-evaluated prior to induction Oxygen Delivery Method: Circle System Utilized Preoxygenation: Pre-oxygenation with 100% oxygen Induction Type: IV induction Ventilation: Mask ventilation without difficulty Laryngoscope Size: Miller and 3 Grade View: Grade I Tube type: Oral Tube size: 7.0 mm Number of attempts: 1 Airway Equipment and Method: Stylet and Oral airway Placement Confirmation: ETT inserted through vocal cords under direct vision, positive ETCO2 and breath sounds checked- equal and bilateral Secured at: 21 cm Tube secured with: Tape Dental Injury: Teeth and Oropharynx as per pre-operative assessment

## 2021-11-12 NOTE — Brief Op Note (Signed)
11/12/2021  9:15 AM  PATIENT:  Penny Thomas  36 y.o. 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   Delay start of Pharmacological VTE agent (>24hrs) due to surgical blood loss or risk of bleeding: yes

## 2021-11-13 ENCOUNTER — Other Ambulatory Visit (HOSPITAL_COMMUNITY): Payer: Self-pay

## 2021-11-13 ENCOUNTER — Encounter (HOSPITAL_COMMUNITY): Payer: Self-pay | Admitting: Specialist

## 2021-11-13 DIAGNOSIS — M5127 Other intervertebral disc displacement, lumbosacral region: Secondary | ICD-10-CM | POA: Diagnosis not present

## 2021-11-13 MED ORDER — GABAPENTIN 100 MG PO CAPS
200.0000 mg | ORAL_CAPSULE | Freq: Two times a day (BID) | ORAL | 1 refills | Status: DC
  Filled 2021-11-13: qty 40, 10d supply, fill #0
  Filled 2021-11-16: qty 40, 10d supply, fill #1

## 2021-11-13 MED ORDER — HYDROCODONE-ACETAMINOPHEN 10-325 MG PO TABS
0.5000 | ORAL_TABLET | ORAL | 0 refills | Status: DC | PRN
  Filled 2021-11-13: qty 30, 5d supply, fill #0

## 2021-11-13 MED ORDER — METHOCARBAMOL 500 MG PO TABS
500.0000 mg | ORAL_TABLET | Freq: Four times a day (QID) | ORAL | 1 refills | Status: DC | PRN
  Filled 2021-11-13: qty 30, 8d supply, fill #0

## 2021-11-13 MED ORDER — DOCUSATE SODIUM 100 MG PO CAPS
100.0000 mg | ORAL_CAPSULE | Freq: Two times a day (BID) | ORAL | 0 refills | Status: DC
  Filled 2021-11-13: qty 10, 5d supply, fill #0

## 2021-11-13 NOTE — TOC Transition Note (Signed)
Transition of Care [facility]) - CM/SW Discharge Note   Patient Details  Name: Penny Thomas MRN: [id number] Date of Birth: March 15, 1986  [facility] CM/SW Contact:  Pollie Friar, RN Phone Number: 11/13/2021, 11:09 AM   Clinical Narrative:    Pt discharging home with home health services through [place]. Information on the AVS. Any needed DME will be obtained by bedside RN.  Pt has transportation home.   Final next level of care: [place] Barriers to Discharge: No Barriers Identified   Patient Goals and CMS Choice     Choice offered to / list presented to : Patient  Discharge Placement                       Discharge Plan and Services                          HH Arranged: PT [facility] Agency: [place] Date [place]: 11/12/21   Representative spoke with at [place]: [place] ([place]) Interventions     Readmission Risk Interventions     No data to display

## 2021-11-13 NOTE — Progress Notes (Signed)
PT Cancellation Note  Patient Details Name: Penny Thomas MRN: [id number] DOB: [birth year]   Cancelled Treatment:    Reason Eval/Treat Not Completed: (P) OT screened, no needs identified, will sign off Pt seen walking in hallway without issue with OT.  Riccardo Holeman B. Migdalia Dk PT, DPT Acute Rehabilitation Services Please use secure chat or  Call Office [phone]  [place] 11/13/2021, 9:13 AM

## 2021-11-13 NOTE — Progress Notes (Signed)
    Subjective: 1 Day Post-Op Procedure(s) (LRB): BILATERAL LUMBAR FIVE - SACRAL ONE MICRODISCECTOMY (N/A) Awake, alert and oriented x 4. Up to bathroom, voiding without difficulty. Walking in the hallway.  Patient reports pain as moderate.    Objective:   VITALS:  Temp:  [97.2 F (36.2 C)-98.6 F (37 C)] 98.6 F (37 C) (06/20 0738) Pulse Rate:  [58-83] 71 (06/20 0738) Resp:  [11-18] 16 (06/20 0738) BP: (104-129)/(55-80) 127/72 (06/20 0738) SpO2:  [93 %-100 %] 100 % (06/20 0738)  Neurologically intact ABD soft Neurovascular intact Sensation intact distally Intact pulses distally Dorsiflexion/Plantar flexion intact Incision: scant drainage   LABS No results for input(s): "HGB", "WBC", "PLT" in the last 72 hours. No results for input(s): "NA", "K", "CL", "CO2", "BUN", "CREATININE", "GLUCOSE" in the last 72 hours. No results for input(s): "LABPT", "INR" in the last 72 hours.   Assessment/Plan: 1 Day Post-Op Procedure(s) (LRB): BILATERAL LUMBAR FIVE - SACRAL ONE MICRODISCECTOMY (N/A)  Advance diet Up with therapy D/C IV fluids Discharge home with home health  Basil Dess 11/13/2021, 7:50 AM Patient ID: Penny Thomas, female   DOB: [birth year], 36 y.o.   MRN: [id number]

## 2021-11-13 NOTE — Evaluation (Signed)
Occupational Therapy Evaluation Patient Details Name: Penny Thomas MRN: [id number] DOB: [birth year] Today's Date: 11/13/2021   History of Present Illness Pt is a 36 y/o F s/p bilateral L5-S1 microdiscectomy. PMH includes anxiety, asthma, anemia, COVID-19, HTN, and pre-diabetes.   Clinical Impression   Pt independent at baseline with ADLs and functional mobility, lives with family who can provide assistance at d/c. Pt currently requiring mod I -supervision for ADLs, supervision for bed mobility using log rolling technique, and supervision fading to mod I for transfers without AD. Pt able to complete hallway distance ambulation and stair navigation with mod I. Pt educated on compensatory strategies for ADLs, bed mobility, and precautions, pt verbalized understanding, adheres to precautions well during session. Pt presenting with impairments listed below, however has no acute OT needs at this time. Will s/o. Recommend d/c home with family assistance.     Recommendations for follow up therapy are one component of a multi-disciplinary discharge planning process, led by the attending physician.  Recommendations may be updated based on patient status, additional functional criteria and insurance authorization.   Follow Up Recommendations  No OT follow up    Assistance Recommended at Discharge Set up Supervision/Assistance  Patient can return home with the following Assistance with cooking/housework;Assist for transportation    Functional Status Assessment  Patient has had a recent decline in their functional status and demonstrates the ability to make significant improvements in function in a reasonable and predictable amount of time.  Equipment Recommendations  BSC/3in1    Recommendations for Other Services       Precautions / Restrictions Precautions Precautions: Back Precaution Booklet Issued: Yes (comment) Precaution Comments: educated pt on 3/3 back precautions Required  Braces or Orthoses: Other Brace Other Brace: no brace needed per MD Restrictions Weight Bearing Restrictions: No      Mobility Bed Mobility Overal bed mobility: Needs Assistance Bed Mobility: Sidelying to Sit   Sidelying to sit: Supervision       General bed mobility comments: log rolling technique    Transfers Overall transfer level: Needs assistance Equipment used: None Transfers: Sit to/from Stand Sit to Stand: Supervision           General transfer comment: supervision initially, fading to mod I thorughout session      Balance Overall balance assessment: No apparent balance deficits (not formally assessed)                                         ADL either performed or assessed with clinical judgement   ADL Overall ADL's : Needs assistance/impaired Eating/Feeding: Modified independent;Sitting   Grooming: Modified independent;Sitting   Upper Body Bathing: Supervision/ safety;Sitting;Standing   Lower Body Bathing: Supervison/ safety;Sitting/lateral leans;Sit to/from stand   Upper Body Dressing : Supervision/safety;Standing   Lower Body Dressing: Supervision/safety;Sitting/lateral leans;Sit to/from stand   Toilet Transfer: Supervision/safety;Ambulation;Comfort height toilet   Toileting- Clothing Manipulation and Hygiene: Supervision/safety   Tub/ Shower Transfer: Supervision/safety   Functional mobility during ADLs: Modified independent       Vision   Vision Assessment?: No apparent visual deficits     Perception     Praxis      Pertinent Vitals/Pain Pain Assessment Pain Assessment: Faces Pain Score: 8  Faces Pain Scale: Hurts whole lot Pain Location: back Pain Descriptors / Indicators: Discomfort Pain Intervention(s): Limited activity within patient's tolerance, Monitored during session, RN gave pain meds during session,  Repositioned     Hand Dominance     Extremity/Trunk Assessment Upper Extremity Assessment Upper  Extremity Assessment: Overall WFL for tasks assessed   Lower Extremity Assessment Lower Extremity Assessment: Overall WFL for tasks assessed   Cervical / Trunk Assessment Cervical / Trunk Assessment: Back Surgery   Communication Communication Communication: No difficulties   Cognition Arousal/Alertness: Awake/alert Behavior During Therapy: WFL for tasks assessed/performed Overall Cognitive Status: Within Functional Limits for tasks assessed                                       General Comments  VSS on RA    Exercises     Shoulder Instructions      Home Living Family/patient expects to be discharged to:: Private residence Living Arrangements: Spouse/significant other;Children Available Help at Discharge: Family Type of Home: House Home Access: Level entry     Home Layout: Two level;Bed/bath upstairs Alternate Level Stairs-Number of Steps: 5   Bathroom Shower/Tub: Corporate investment banker: Standard     Home Equipment: None          Prior Functioning/Environment Prior Level of Function : Independent/Modified Independent;Driving             Mobility Comments: no AD use ADLs Comments: does IADLs        OT Problem List: Decreased range of motion;Decreased activity tolerance      OT Treatment/Interventions:      OT Goals(Current goals can be found in the care plan section) Acute Rehab OT Goals Patient Stated Goal: none stated OT Goal Formulation: With patient Time For Goal Achievement: 11/27/21 Potential to Achieve Goals: Good  OT Frequency:      Co-evaluation              AM-PAC OT "6 Clicks" Daily Activity     Outcome Measure Help from another person eating meals?: None Help from another person taking care of personal grooming?: None Help from another person toileting, which includes using toliet, bedpan, or urinal?: None Help from another person bathing (including washing, rinsing, drying)?: None Help  from another person to put on and taking off regular upper body clothing?: None Help from another person to put on and taking off regular lower body clothing?: None 6 Click Score: 24   End of Session Nurse Communication: Mobility status  Activity Tolerance: Patient tolerated treatment well Patient left: in chair;with call bell/phone within reach  OT Visit Diagnosis: Unsteadiness on feet (R26.81);Other abnormalities of gait and mobility (R26.89);Muscle weakness (generalized) (M62.81)                Time: 2025-4270 OT Time Calculation (min): 28 min Charges:  OT General Charges $OT Visit: 1 Visit OT Evaluation $OT Eval Low Complexity: 1 Low OT Treatments $Self Care/Home Management : 8-22 mins  Lynnda Child, OTD, OTR/L Acute Rehab [phone]) [phone] - [place] 11/13/2021, 9:57 AM

## 2021-11-13 NOTE — Progress Notes (Signed)
Patient is discharged from room 3C04 at this time. Alert and in stable condition. IV site d/c'd and instructions read to patient with understanding verbalized and all questions answered. Left unit via wheelchair with all belongings at side. 

## 2021-11-15 ENCOUNTER — Encounter: Payer: Self-pay | Admitting: Specialist

## 2021-11-16 ENCOUNTER — Other Ambulatory Visit: Payer: Self-pay | Admitting: Specialist

## 2021-11-17 ENCOUNTER — Other Ambulatory Visit (HOSPITAL_COMMUNITY): Payer: Self-pay

## 2021-11-19 ENCOUNTER — Other Ambulatory Visit: Payer: Self-pay | Admitting: Specialist

## 2021-11-19 MED ORDER — HYDROCODONE-ACETAMINOPHEN 10-325 MG PO TABS
0.5000 | ORAL_TABLET | ORAL | 0 refills | Status: DC | PRN
  Filled 2021-11-19: qty 30, 5d supply, fill #0

## 2021-11-20 ENCOUNTER — Other Ambulatory Visit (HOSPITAL_COMMUNITY): Payer: Self-pay

## 2021-11-21 ENCOUNTER — Other Ambulatory Visit (HOSPITAL_COMMUNITY): Payer: Self-pay

## 2021-11-22 ENCOUNTER — Ambulatory Visit (INDEPENDENT_AMBULATORY_CARE_PROVIDER_SITE_OTHER): Payer: BC Managed Care – PPO | Admitting: Surgery

## 2021-11-22 ENCOUNTER — Other Ambulatory Visit (HOSPITAL_COMMUNITY): Payer: Self-pay

## 2021-11-22 DIAGNOSIS — Z9889 Other specified postprocedural states: Secondary | ICD-10-CM

## 2021-11-22 MED ORDER — METHYLPREDNISOLONE 4 MG PO TBPK
ORAL_TABLET | ORAL | 0 refills | Status: DC
  Filled 2021-11-22: qty 21, 6d supply, fill #0

## 2021-11-22 NOTE — Progress Notes (Signed)
37 year old black female who is little over a week out from bilateral L5-S1 microdiscectomy returns.  She is scheduled for 2-week postop appointment next week with Dr. Louanne Skye.  Comes in early because she has been having increased low back pain.  No radicular symptoms.  States that she is uncomfortable when she is sleeping at night.  States that she has not been driving or doing any strenuous activity.  She is currently taking Norco, gabapentin, Robaxin and ibuprofen.  Exam Pleasant female in no acute distress.  Wound looks good.  Staples intact.  No drainage or signs infection.  Ambulating well.   Plan I stressed to patient the importance of no bending, twisting, strenuous activity, driving.  Sent in a prescription for Medrol Dosepak 6-day taper to be taken as directed.  Discontinue ibuprofen.  Can continue taking the other medications prescribed by Dr. Louanne Skye.  Follow-up with him next week as scheduled.  Possible staple removal at that time.

## 2021-11-24 ENCOUNTER — Other Ambulatory Visit: Payer: Self-pay | Admitting: Specialist

## 2021-11-26 ENCOUNTER — Other Ambulatory Visit: Payer: Self-pay | Admitting: Specialist

## 2021-11-28 ENCOUNTER — Encounter: Payer: Self-pay | Admitting: Specialist

## 2021-11-28 ENCOUNTER — Other Ambulatory Visit (HOSPITAL_COMMUNITY): Payer: Self-pay

## 2021-11-28 ENCOUNTER — Ambulatory Visit (INDEPENDENT_AMBULATORY_CARE_PROVIDER_SITE_OTHER): Payer: BC Managed Care – PPO | Admitting: Specialist

## 2021-11-28 VITALS — BP 110/76 | HR 88 | Ht 67.0 in | Wt 187.0 lb

## 2021-11-28 DIAGNOSIS — M5116 Intervertebral disc disorders with radiculopathy, lumbar region: Secondary | ICD-10-CM

## 2021-11-28 DIAGNOSIS — M5416 Radiculopathy, lumbar region: Secondary | ICD-10-CM

## 2021-11-28 DIAGNOSIS — Z9889 Other specified postprocedural states: Secondary | ICD-10-CM

## 2021-11-28 MED ORDER — TRAMADOL HCL 50 MG PO TABS
100.0000 mg | ORAL_TABLET | Freq: Four times a day (QID) | ORAL | 0 refills | Status: DC | PRN
  Filled 2021-11-28: qty 40, 5d supply, fill #0

## 2021-11-28 NOTE — Progress Notes (Signed)
Post-Op Visit Note   Patient: Penny Thomas           Date of Birth: [birth year]           MRN: [id number] Visit Date: 11/28/2021 PCP: Irene Pap, PA-C   Assessment & Plan: 2 weeks post op L5-S1 bilateral microdiscectomy  Chief Complaint:  Chief Complaint  Patient presents with   Lower Back - Routine Post Op  Incision is healling , no swelling induration or fluctuancel SLR negative' Motor is normal  Taking tramadol for pain. Visit Diagnoses: No diagnosis found.  Plan: Avoid frequent bending and stooping  No lifting greater than 10 lbs. May use ice or moist heat for pain. Weight loss is of benefit. Best medication for lumbar disc disease is arthritis medications like motrin, celebrex and naprosyn. Exercise is important to improve your indurance and does allow people to function better inspite of back pain.    Follow-Up Instructions: No follow-ups on file.   Orders:  No orders of the defined types were placed in this encounter.  No orders of the defined types were placed in this encounter.   Imaging: No results found.  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   Panic attacks 06/16/2019   Situational anxiety 06/16/2019   Great toe pain, left 06/16/2019   History of gestational diabetes 12/02/2018   Prediabetes 12/02/2018   Mild persistent asthma without complication 35/00/9381   Intractable chronic migraine without aura and without status migrainosus 06/23/2015   Migraines 03/23/2015   Right-sided low back pain without sciatica 03/23/2015   Dysmenorrhea 04/08/2013   Endometriosis 04/08/2013   Past Medical History:  Diagnosis Date   Acute  nasopharyngitis 12/11/2016   Acute non-recurrent frontal sinusitis 05/31/2020   Allergy    Anemia    takes iron supplement   Anxiety    Asthma    Asthma    prn inhaler   Blood transfusion 2007   Blood transfusion 2010   Blood transfusion without reported diagnosis    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   Cough 05/31/2020   COVID-19 05/24/2020   H/O: hysterectomy 10/2019   Hemorrhage in uterus 01/20/2009   Hypertension    Migraines    Muscle cramps 82/99/3716   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   Postoperative state 12/01/2019   Pre-diabetes    Recurrent upper respiratory infection (URI)    Seasonal allergies    Sickle cell anemia (HCC)    trait   Sickle cell trait ([place])     Family History  Problem Relation Age of Onset   Fibromyalgia Mother    Multiple sclerosis Mother    Asthma Father    Healthy Maternal Grandmother    Healthy Maternal Grandfather    Healthy Paternal Grandmother    Healthy Paternal Grandfather    Endometriosis Sister    Healthy Brother    Healthy Child     Past Surgical History:  Procedure Laterality Date   ABDOMINAL HYSTERECTOMY     CESAREAN SECTION  09/28/2005   CESAREAN SECTION  01/20/2009   CESAREAN SECTION     DIAGNOSTIC LAPAROSCOPY     ENDOMETRIAL ABLATION  10/2011   HEROPTIOIN  HERNIA REPAIR     bilateral groins as a child   KNEE ARTHROSCOPY Right 02/16/2014   Procedure: RIGHT ARTHROSCOPY KNEE;  Surgeon: Kerin Salen, MD;  Location: [facility];  Service: Orthopedics;  Laterality: Right;   LUMBAR LAMINECTOMY/DECOMPRESSION MICRODISCECTOMY N/A 11/12/2021   Procedure: BILATERAL LUMBAR FIVE - SACRAL ONE MICRODISCECTOMY;  Surgeon: Jessy Oto, MD;  Location: [facility];  Service: Orthopedics;  Laterality: N/A;   POLYP REMOVAL  09/2006   ?CERVICAL OR UTERINE POLYP   ROBOTIC ASSISTED LAPAROSCOPIC HYSTERECTOMY AND SALPINGECTOMY Bilateral 12/01/2019   Procedure: XI ROBOTIC ASSISTEDTOTAL TOTAL LAPAROSCOPIC HYSTERECTOMY AND RIGHT  SALPINGECTOMY , EXTENSIVE LYSIS OF ADHESIONS;  Surgeon: [name], MD;  Location: [facility];  Service: Gynecology;  Laterality: Bilateral;  request 8:30am OR time. Requests 2 hours   TUBAL LIGATION  01/20/2009   DONE WITH C-SECTION   WISDOM TOOTH EXTRACTION Right 2014   Social History   Occupational History   Occupation: NT/Transporter  Tobacco Use   Smoking status: Never   Smokeless tobacco: Never  Vaping Use   Vaping Use: Never used  Substance and Sexual Activity   Alcohol use: Yes    Alcohol/week: 0.0 standard drinks of alcohol    Comment: seldom   Drug use: No   Sexual activity: Yes    Birth control/protection: Surgical    Comment: TUBAL LIGATION, Hyst, INTERCOURSE AGE 56, SEXUAL PARTNERS 5

## 2021-12-05 NOTE — Discharge Summary (Signed)
Patient ID: Penny Thomas MRN: [id number] DOB/AGE: 10-19-1985 36 y.o.  Admit date: 11/12/2021 Discharge date: 11/13/2021  Admission Diagnoses:  Principal Problem:   Lumbosacral disc herniation Active Problems:   Status post lumbar laminectomy   Discharge Diagnoses:  Principal Problem:   Lumbosacral disc herniation Active Problems:   Status post lumbar laminectomy  status post Procedure(s): BILATERAL LUMBAR FIVE - SACRAL ONE MICRODISCECTOMY  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   Cough 05/31/2020   COVID-19 05/24/2020   H/O: hysterectomy 10/2019   Hemorrhage in uterus 01/20/2009   Hypertension    Migraines    Muscle cramps 40/98/1191   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 female who was admitted 11/12/2021 for operative treatment of Lumbosacral disc herniation. Patient failed conservative treatments (please see the history and physical for the specifics) and had severe unremitting pain that affects sleep, daily activities and work/hobbies. After pre-op clearance, the patient was taken to the operating room on 11/12/2021 and underwent  Procedure(s): BILATERAL LUMBAR FIVE - SACRAL ONE MICRODISCECTOMY.    Patient was given perioperative antibiotics:  Anti-infectives (From  admission, onward)    Start     Dose/Rate Route Frequency Ordered Stop   11/12/21 1600  ceFAZolin (ANCEF) IVPB 2g/100 mL premix        2 g 200 mL/hr over 30 Minutes Intravenous Every 8 hours 11/12/21 1050 11/13/21 0038   11/12/21 0600  ceFAZolin (ANCEF) IVPB 2g/100 mL premix        2 g 200 mL/hr over 30 Minutes Intravenous On call to O.R. 11/12/21 0542 11/12/21 0815        Patient was given sequential compression devices and early ambulation to prevent DVT.   Patient benefited maximally from hospital stay and there were no complications. At the time of discharge, the patient was urinating/moving their bowels without difficulty, tolerating a regular diet, pain is controlled with oral pain medications and they have been cleared by PT/OT.   Recent vital signs: No data found.   Recent laboratory studies: No results for input(s): "WBC", "HGB", "HCT", "PLT", "NA", "K", "CL", "CO2", "BUN", "CREATININE", "GLUCOSE", "INR", "CALCIUM" in the last 72 hours.  Invalid input(s): "PT", "2"   Discharge Medications:   Allergies as of 11/13/2021       Reactions   [name] [elagolix] Other (See Comments)   Hallucinations        Medication List     STOP taking these medications    traMADol 50 MG tablet Commonly known as: ULTRAM       TAKE these medications    albuterol 108 (90 Base) MCG/ACT inhaler Commonly known as: VENTOLIN HFA Inhale 4 puffs into the lungs every 4-6 hours as needed  Alvesco 160 MCG/ACT inhaler Generic drug: ciclesonide Inhale 1 puff into the lungs 2 (two) times daily.   beclomethasone 80 MCG/ACT inhaler Commonly known as: QVAR Inhale 1 puff into the lungs 2 (two) times daily.   BIOTIN PO Take 1 tablet by mouth daily.   cetirizine 10 MG tablet Commonly known as: ZYRTEC Take 1 tablet by mouth daily.   docusate sodium 100 MG capsule Commonly known as: COLACE Take 1 capsule (100 mg total) by mouth 2 (two) times daily.   famotidine 20 MG  tablet Commonly known as: PEPCID Take 1 tablet by mouth 1 - 2 times per day for itching   fexofenadine 180 MG tablet Commonly known as: ALLEGRA TAKE 1 TABLET BY MOUTH ONCE A DAY   fluticasone 50 MCG/ACT nasal spray Commonly known as: FLONASE Place 2 sprays in each nostril daily as directed What changed:  how much to take when to take this reasons to take this   gabapentin 100 MG capsule Commonly known as: NEURONTIN Take 2 capsules (200 mg total) by mouth 2 (two) times daily.   multivitamin with minerals Tabs tablet Take 1 tablet by mouth daily.   naproxen 500 MG tablet Commonly known as: NAPROSYN TAKE 1 TABLET BY MOUTH EVERY 12 HOURS AS NEEDED.   PROBIOTIC-PREBIOTIC PO Take 1 capsule by mouth daily.   propranolol ER 120 MG 24 hr capsule Commonly known as: INDERAL LA Take 1 capsule (120 mg total) by mouth at bedtime.   simethicone 125 MG chewable tablet Commonly known as: MYLICON Chew 027 mg by mouth in the morning and at bedtime.   SUMAtriptan 6 MG/0.5ML Soaj INJECT 6 MG INTO THE SKIN AS NEEDED (MAY REPEAT IN ONE HOUR. MAXIMUM 2 INJECTIONS IN 24 HOURS.).        Diagnostic Studies: DG Lumbar Spine 2-3 Views  Result Date: 11/12/2021 CLINICAL DATA:  Intraoperative localization for lumbar spine surgery. EXAM: LUMBAR SPINE - 2-3 VIEW COMPARISON:  08/08/2021. FINDINGS: Two portable lateral lumbar spine radiograph submitted. Initial image demonstrates a surgical needle with its tip projecting 3.1 cm posterior to the posterior lower endplate of the L4 vertebra. Subsequent image shows placement of a surgical instrument with its tip projecting 2.7 cm posterior to the posterior upper endplate of S1. IMPRESSION: Surgical localization imaging as detailed Electronically Signed   By: Lajean Manes M.D.   On: 11/12/2021 09:49    Discharge Instructions     Call MD / Call 911   Complete by: As directed    If you experience chest pain or shortness of breath, CALL 911 and be  transported to the hospital emergency room.  If you develope a fever above 101 F, pus (white drainage) or increased drainage or redness at the wound, or calf pain, call your surgeon's office.   Constipation Prevention   Complete by: As directed    Drink plenty of fluids.  Prune juice may be helpful.  You may use a stool softener, such as Colace (over the counter) 100 mg twice a day.  Use MiraLax (over the counter) for constipation as needed.   Diet - low sodium heart healthy   Complete by: As directed    Discharge instructions   Complete by: As directed    No lifting greater than 10 lbs. Avoid bending, stooping and twisting. Walk in house for first week them may start to get out slowly increasing distance up to one half mile by 3 weeks post op. Keep incision dry for 3 days, may use  tegaderm or similar water impervious dressing.   Driving restrictions   Complete by: As directed    No driving for 3 weeks   Increase activity slowly as tolerated   Complete by: As directed    Lifting restrictions   Complete by: As directed    No lifting for 6 weeks   Post-operative opioid taper instructions:   Complete by: As directed    POST-OPERATIVE OPIOID TAPER INSTRUCTIONS: It is important to wean off of your opioid medication as soon as possible. If you do not need pain medication after your surgery it is ok to stop day one. Opioids include: Codeine, Hydrocodone(Norco, Vicodin), Oxycodone(Percocet, oxycontin) and hydromorphone amongst others.  Long term and even short term use of opiods can cause: Increased pain response Dependence Constipation Depression Respiratory depression And more.  Withdrawal symptoms can include Flu like symptoms Nausea, vomiting And more Techniques to manage these symptoms Hydrate well Eat regular healthy meals Stay active Use relaxation techniques(deep breathing, meditating, yoga) Do Not substitute Alcohol to help with tapering If you have been on opioids for  less than two weeks and do not have pain than it is ok to stop all together.  Plan to wean off of opioids This plan should start within one week post op of your joint replacement. Maintain the same interval or time between taking each dose and first decrease the dose.  Cut the total daily intake of opioids by one tablet each day Next start to increase the time between doses. The last dose that should be eliminated is the evening dose.           Follow-up Information     Jessy Oto, MD Follow up in 2 week(s).   Specialty: Orthopedic Surgery Why: For wound re-check Contact information: [place] [address] [phone]         [facility] Follow up.   Specialty: Home Health Services Why: The home health agency will contact you for the first home visit. Contact information: [address] [place] [address] [phone]                 Discharge Plan:  discharge to home  Disposition:     Signed: Benjiman Core  12/05/2021, 3:04 PM

## 2021-12-07 ENCOUNTER — Other Ambulatory Visit (HOSPITAL_COMMUNITY): Payer: Self-pay

## 2021-12-07 ENCOUNTER — Other Ambulatory Visit: Payer: Self-pay | Admitting: Neurology

## 2021-12-07 MED ORDER — PROPRANOLOL HCL ER 120 MG PO CP24
120.0000 mg | ORAL_CAPSULE | Freq: Every day | ORAL | 5 refills | Status: DC
  Filled 2021-12-07: qty 30, 30d supply, fill #0
  Filled 2021-12-28: qty 30, 30d supply, fill #1
  Filled 2022-01-25 – 2022-02-13 (×2): qty 30, 30d supply, fill #2
  Filled 2022-03-13: qty 30, 30d supply, fill #3
  Filled 2022-04-13: qty 30, 30d supply, fill #4
  Filled 2022-05-13: qty 30, 30d supply, fill #5

## 2021-12-10 ENCOUNTER — Other Ambulatory Visit (HOSPITAL_COMMUNITY): Payer: Self-pay

## 2021-12-14 ENCOUNTER — Telehealth: Payer: Self-pay | Admitting: Specialist

## 2021-12-14 NOTE — Telephone Encounter (Signed)
Received $25.00 cash, medical records release form and disability paperwork/Forwarding to [facility] today

## 2021-12-16 ENCOUNTER — Other Ambulatory Visit: Payer: Self-pay | Admitting: Specialist

## 2021-12-17 ENCOUNTER — Other Ambulatory Visit (HOSPITAL_COMMUNITY): Payer: Self-pay

## 2021-12-17 MED ORDER — GABAPENTIN 100 MG PO CAPS
200.0000 mg | ORAL_CAPSULE | Freq: Two times a day (BID) | ORAL | 1 refills | Status: DC
Start: 2021-12-17 — End: 2022-01-12
  Filled 2021-12-17: qty 40, 10d supply, fill #0
  Filled 2021-12-28: qty 40, 10d supply, fill #1

## 2021-12-17 MED ORDER — TRAMADOL HCL 50 MG PO TABS
100.0000 mg | ORAL_TABLET | Freq: Four times a day (QID) | ORAL | 0 refills | Status: DC | PRN
  Filled 2021-12-17: qty 40, 5d supply, fill #0

## 2021-12-27 ENCOUNTER — Ambulatory Visit (INDEPENDENT_AMBULATORY_CARE_PROVIDER_SITE_OTHER): Payer: BC Managed Care – PPO | Admitting: Specialist

## 2021-12-27 DIAGNOSIS — Z9889 Other specified postprocedural states: Secondary | ICD-10-CM

## 2021-12-28 ENCOUNTER — Other Ambulatory Visit (HOSPITAL_COMMUNITY): Payer: Self-pay

## 2021-12-28 ENCOUNTER — Encounter: Payer: Self-pay | Admitting: Specialist

## 2021-12-28 ENCOUNTER — Other Ambulatory Visit: Payer: Self-pay | Admitting: Specialist

## 2021-12-28 MED ORDER — TRAMADOL HCL 50 MG PO TABS
100.0000 mg | ORAL_TABLET | Freq: Four times a day (QID) | ORAL | 0 refills | Status: DC | PRN
  Filled 2021-12-28: qty 40, 5d supply, fill #0

## 2021-12-31 ENCOUNTER — Encounter: Payer: Self-pay | Admitting: Surgery

## 2021-12-31 ENCOUNTER — Other Ambulatory Visit (HOSPITAL_COMMUNITY): Payer: Self-pay

## 2021-12-31 ENCOUNTER — Ambulatory Visit (INDEPENDENT_AMBULATORY_CARE_PROVIDER_SITE_OTHER): Payer: BC Managed Care – PPO | Admitting: Surgery

## 2021-12-31 DIAGNOSIS — Z9889 Other specified postprocedural states: Secondary | ICD-10-CM

## 2021-12-31 MED ORDER — METHOCARBAMOL 500 MG PO TABS
500.0000 mg | ORAL_TABLET | Freq: Three times a day (TID) | ORAL | 0 refills | Status: DC | PRN
  Filled 2021-12-31: qty 40, 14d supply, fill #0

## 2021-12-31 NOTE — Telephone Encounter (Signed)
See message request for appt

## 2021-12-31 NOTE — Progress Notes (Signed)
36 year old white female who is 6 weeks status post bilateral L5-S1 microdiscectomy returns.  States that she is doing well.  Continues to have some discomfort in her low back mainly around her incision.  Lower extremity radicular symptoms resolved.  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    Exam Pleasant female alert and oriented in no acute distress.  Surgical incision well-healed.  She has tenderness around her incision.  No signs of infection.  Neurologically intact.  Some discomfort with going up to a standing position.     Plan Advised patient with a work that she does think it would be best to keep her out of work for another 3 to 4 weeks.  Follow-up with Dr. Louanne Skye in 3 weeks for recheck and he can decide return to work status at that point.  I sent in a prescription for Robaxin.

## 2022-01-03 ENCOUNTER — Encounter: Payer: Self-pay | Admitting: Specialist

## 2022-01-09 ENCOUNTER — Other Ambulatory Visit (HOSPITAL_COMMUNITY): Payer: Self-pay

## 2022-01-12 ENCOUNTER — Other Ambulatory Visit: Payer: Self-pay | Admitting: Specialist

## 2022-01-15 ENCOUNTER — Encounter: Payer: Self-pay | Admitting: Specialist

## 2022-01-15 ENCOUNTER — Other Ambulatory Visit (HOSPITAL_COMMUNITY): Payer: Self-pay

## 2022-01-15 ENCOUNTER — Other Ambulatory Visit: Payer: Self-pay | Admitting: Radiology

## 2022-01-16 ENCOUNTER — Other Ambulatory Visit (HOSPITAL_COMMUNITY): Payer: Self-pay

## 2022-01-16 MED ORDER — TRAMADOL HCL 50 MG PO TABS
100.0000 mg | ORAL_TABLET | Freq: Four times a day (QID) | ORAL | 0 refills | Status: DC | PRN
  Filled 2022-01-16: qty 40, 5d supply, fill #0

## 2022-01-16 MED ORDER — GABAPENTIN 100 MG PO CAPS
200.0000 mg | ORAL_CAPSULE | Freq: Two times a day (BID) | ORAL | 1 refills | Status: DC
Start: 2022-01-16 — End: 2022-02-06
  Filled 2022-01-16: qty 40, 10d supply, fill #0
  Filled 2022-01-25: qty 40, 10d supply, fill #1

## 2022-01-23 ENCOUNTER — Encounter: Payer: Self-pay | Admitting: Specialist

## 2022-01-23 ENCOUNTER — Ambulatory Visit (INDEPENDENT_AMBULATORY_CARE_PROVIDER_SITE_OTHER): Payer: BC Managed Care – PPO | Admitting: Specialist

## 2022-01-23 VITALS — BP 120/84 | HR 76 | Ht 67.0 in | Wt 187.0 lb

## 2022-01-23 DIAGNOSIS — M5442 Lumbago with sciatica, left side: Secondary | ICD-10-CM

## 2022-01-23 DIAGNOSIS — Z9889 Other specified postprocedural states: Secondary | ICD-10-CM | POA: Diagnosis not present

## 2022-01-23 DIAGNOSIS — M5136 Other intervertebral disc degeneration, lumbar region: Secondary | ICD-10-CM

## 2022-01-23 DIAGNOSIS — G8929 Other chronic pain: Secondary | ICD-10-CM

## 2022-01-23 DIAGNOSIS — M51369 Other intervertebral disc degeneration, lumbar region without mention of lumbar back pain or lower extremity pain: Secondary | ICD-10-CM

## 2022-01-23 NOTE — Progress Notes (Signed)
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 Legs with normal motor.  SLR is negative. Incision is healed. Visit Diagnoses: No diagnosis found.  Plan: Avoid frequent bending and stooping  No lifting greater than 10 lbs. May use ice or moist heat for pain. Weight loss is of benefit. Best medication for lumbar disc disease is arthritis medications like motrin, celebrex and naprosyn. Exercise is important to improve your indurance and does allow people to function better inspite of back pain.    Follow-Up Instructions: No follow-ups on file.   Orders:  No orders of the defined types were placed in this encounter.  No orders of the defined types were placed in this encounter.   Imaging: No results found.  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   Panic attacks 06/16/2019   Situational anxiety 06/16/2019   Great toe pain, left 06/16/2019   History of gestational diabetes 12/02/2018   Prediabetes 12/02/2018   Mild persistent asthma without complication 79/89/2119   Intractable chronic migraine without aura and without status migrainosus 06/23/2015   Migraines 03/23/2015   Right-sided low back pain without sciatica 03/23/2015   Dysmenorrhea 04/08/2013   Endometriosis 04/08/2013   Past Medical  History:  Diagnosis Date   Acute nasopharyngitis 12/11/2016   Acute non-recurrent frontal sinusitis 05/31/2020   Allergy    Anemia    takes iron supplement   Anxiety    Asthma    Asthma    prn inhaler   Blood transfusion 2007   Blood transfusion 2010   Blood transfusion without reported diagnosis    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   Cough 05/31/2020   COVID-19 05/24/2020   H/O: hysterectomy 10/2019   Hemorrhage in uterus 01/20/2009   Hypertension    Migraines    Muscle cramps 41/74/0814   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   Postoperative state 12/01/2019   Pre-diabetes    Recurrent upper respiratory infection (URI)    Seasonal allergies    Sickle cell anemia (HCC)    trait   Sickle cell trait ([place])     Family History  Problem Relation Age of Onset   Fibromyalgia Mother    Multiple sclerosis Mother    Asthma Father    Healthy Maternal Grandmother    Healthy Maternal Grandfather    Healthy Paternal Grandmother    Healthy Paternal Grandfather    Endometriosis Sister    Healthy Brother    Healthy Child     Past Surgical History:  Procedure Laterality Date   ABDOMINAL HYSTERECTOMY     CESAREAN SECTION  09/28/2005   CESAREAN SECTION  01/20/2009   CESAREAN SECTION     DIAGNOSTIC LAPAROSCOPY  ENDOMETRIAL ABLATION  10/2011   HEROPTIOIN   HERNIA REPAIR     bilateral groins as a child   KNEE ARTHROSCOPY Right 02/16/2014   Procedure: RIGHT ARTHROSCOPY KNEE;  Surgeon: Kerin Salen, MD;  Location: [facility];  Service: Orthopedics;  Laterality: Right;   LUMBAR LAMINECTOMY/DECOMPRESSION MICRODISCECTOMY N/A 11/12/2021   Procedure: BILATERAL LUMBAR FIVE - SACRAL ONE MICRODISCECTOMY;  Surgeon: Jessy Oto, MD;  Location: [facility];  Service: Orthopedics;  Laterality: N/A;   POLYP REMOVAL  09/2006   ?CERVICAL OR UTERINE POLYP   ROBOTIC ASSISTED LAPAROSCOPIC HYSTERECTOMY AND SALPINGECTOMY Bilateral 12/01/2019   Procedure: XI ROBOTIC ASSISTEDTOTAL TOTAL  LAPAROSCOPIC HYSTERECTOMY AND RIGHT SALPINGECTOMY , EXTENSIVE LYSIS OF ADHESIONS;  Surgeon: [name], MD;  Location: [facility];  Service: Gynecology;  Laterality: Bilateral;  request 8:30am OR time. Requests 2 hours   TUBAL LIGATION  01/20/2009   DONE WITH C-SECTION   WISDOM TOOTH EXTRACTION Right 2014   Social History   Occupational History   Occupation: NT/Transporter  Tobacco Use   Smoking status: Never   Smokeless tobacco: Never  Vaping Use   Vaping Use: Never used  Substance and Sexual Activity   Alcohol use: Yes    Alcohol/week: 0.0 standard drinks of alcohol    Comment: seldom   Drug use: No   Sexual activity: Yes    Birth control/protection: Surgical    Comment: TUBAL LIGATION, Hyst, INTERCOURSE AGE 36, SEXUAL PARTNERS 5

## 2022-01-23 NOTE — Patient Instructions (Signed)
Avoid frequent bending and stooping  No lifting greater than 10 lbs. May use ice or moist heat for pain. Weight loss is of benefit. Best medication for lumbar disc disease is arthritis medications like motrin, celebrex and naprosyn. Exercise is important to improve your indurance and does allow people to function better inspite of back pain.   

## 2022-01-25 ENCOUNTER — Other Ambulatory Visit: Payer: Self-pay | Admitting: Specialist

## 2022-01-25 ENCOUNTER — Other Ambulatory Visit (HOSPITAL_COMMUNITY): Payer: Self-pay

## 2022-01-25 MED ORDER — TRAMADOL HCL 50 MG PO TABS
100.0000 mg | ORAL_TABLET | Freq: Four times a day (QID) | ORAL | 0 refills | Status: DC | PRN
  Filled 2022-01-25: qty 40, 5d supply, fill #0

## 2022-01-26 ENCOUNTER — Other Ambulatory Visit (HOSPITAL_COMMUNITY): Payer: Self-pay

## 2022-01-30 ENCOUNTER — Encounter: Payer: Self-pay | Admitting: Internal Medicine

## 2022-02-06 ENCOUNTER — Other Ambulatory Visit: Payer: Self-pay | Admitting: Specialist

## 2022-02-06 ENCOUNTER — Other Ambulatory Visit: Payer: Self-pay | Admitting: Family Medicine

## 2022-02-07 ENCOUNTER — Other Ambulatory Visit (HOSPITAL_COMMUNITY): Payer: Self-pay

## 2022-02-07 MED ORDER — GABAPENTIN 100 MG PO CAPS
200.0000 mg | ORAL_CAPSULE | Freq: Two times a day (BID) | ORAL | 1 refills | Status: DC
  Filled 2022-02-07: qty 40, 10d supply, fill #0
  Filled 2022-02-25: qty 40, 10d supply, fill #1

## 2022-02-09 ENCOUNTER — Other Ambulatory Visit (HOSPITAL_COMMUNITY): Payer: Self-pay

## 2022-02-11 ENCOUNTER — Other Ambulatory Visit (HOSPITAL_COMMUNITY): Payer: Self-pay

## 2022-02-13 ENCOUNTER — Other Ambulatory Visit: Payer: Self-pay | Admitting: Specialist

## 2022-02-13 ENCOUNTER — Other Ambulatory Visit (HOSPITAL_COMMUNITY): Payer: Self-pay

## 2022-02-13 ENCOUNTER — Encounter: Payer: Self-pay | Admitting: Nurse Practitioner

## 2022-02-13 ENCOUNTER — Ambulatory Visit (INDEPENDENT_AMBULATORY_CARE_PROVIDER_SITE_OTHER): Payer: BC Managed Care – PPO | Admitting: Nurse Practitioner

## 2022-02-13 VITALS — BP 98/68 | HR 90 | Ht 66.75 in | Wt 187.0 lb

## 2022-02-13 DIAGNOSIS — N898 Other specified noninflammatory disorders of vagina: Secondary | ICD-10-CM | POA: Diagnosis not present

## 2022-02-13 DIAGNOSIS — Z01419 Encounter for gynecological examination (general) (routine) without abnormal findings: Secondary | ICD-10-CM | POA: Diagnosis not present

## 2022-02-13 DIAGNOSIS — F520 Hypoactive sexual desire disorder: Secondary | ICD-10-CM | POA: Diagnosis not present

## 2022-02-13 DIAGNOSIS — Z113 Encounter for screening for infections with a predominantly sexual mode of transmission: Secondary | ICD-10-CM

## 2022-02-13 MED ORDER — TRAMADOL HCL 50 MG PO TABS
100.0000 mg | ORAL_TABLET | Freq: Four times a day (QID) | ORAL | 0 refills | Status: DC | PRN
  Filled 2022-02-13: qty 40, 5d supply, fill #0

## 2022-02-13 MED ORDER — ADDYI 100 MG PO TABS: 1.0000 | ORAL_TABLET | Freq: Every evening | ORAL | 1 refills | Status: DC

## 2022-02-13 NOTE — Progress Notes (Signed)
Penny Thomas March 12, 1986 [id number]   History:  36 y.o. G3P0004 presents for annual exam. S/P 11/2019 robotic TLH with right salpingectomy with extensive lysis of adhesions for chronic severe pelvic pain/endometriosis. Normal pap history. Complains of low sex drive. She has no initiative of intercourse and it is affecting her relationship. She enjoys it once started and does orgasm.   Gynecologic History Patient's last menstrual period was 11/29/2015.   Contraception/Family planning: status post hysterectomy Sexually active: Yes  Health Maintenance Last Pap: 08/10/2019. Results were: Normal neg HPV Last mammogram: Not indicated Last colonoscopy: Not indicated Last Dexa: Not indicated   Past medical history, past surgical history, family history and social history were all reviewed and documented in the EPIC chart. Married. 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  ROS:  A ROS was performed and pertinent positives and negatives are included.  Exam:  Vitals:   02/13/22 1509  BP: 98/68  Pulse: 90  SpO2: 98%  Weight: 187 lb (84.8 kg)  Height: 5' 6.75" (1.695 m)    Body mass index is 29.51 kg/m.  General appearance:  Normal Thyroid:  Symmetrical, normal in size, without palpable masses or nodularity. Respiratory  Auscultation:  Clear without wheezing or rhonchi Cardiovascular  Auscultation:  Regular rate, without rubs, murmurs or gallops  Edema/varicosities:  Not grossly evident Abdominal  Soft,nontender, without masses, guarding or rebound.  Liver/spleen:  No organomegaly noted  Hernia:  None appreciated  Skin  Inspection:  Grossly normal Breasts: Examined lying and sitting.   Right: Without masses, retractions, nipple discharge or axillary adenopathy.   Left: Without masses, retractions, nipple discharge or axillary adenopathy. Genitourinary   Inguinal/mons:  Normal without inguinal adenopathy  External genitalia:  Normal appearing vulva with  no masses, tenderness, or lesions  BUS/Urethra/Skene's glands:  Normal  Vagina:  Vaginal discharge present, no erythema  Cervix:  Absent  Uterus:  Absent  Adnexa/parametria:     Rt: Normal in size, without masses or tenderness.   Lt: Normal in size, without masses or tenderness.  Anus and perineum: Normal  Patient informed chaperone available to be present for breast and pelvic exam. Patient has requested no chaperone to be present. Patient has been advised what will be completed during breast and pelvic exam.   Assessment/Plan:  36 y.o. G3P0004 for annual exam.   Well female exam with routine gynecological exam - Education provided on SBEs, importance of preventative screenings, current guidelines, high calcium diet, regular exercise, and multivitamin daily. Labs done with PCP.   Screen for STD (sexually transmitted disease) - Plan: RPR, HIV Antibody (routine testing w rflx), SureSwab Advanced Vaginitis Plus,TMA.  Vaginal discharge - Plan: SureSwab Advanced Vaginitis Plus,TMA  Hypoactive sexual desire disorder - Plan: Flibanserin (ADDYI) 100 MG TABS nightly. Discussed risk for drowsiness or dizziness. Do not drive, use machinery, or do anything that needs mental alertness for at least 6 hours after your dose and until you know how this medication affects you. Do not stand up or sit up quickly. This reduces the risk of dizzy or fainting spells. Alcohol can increase dizziness and drowsiness, and can increase the risk of low blood pressure or fainting spells when combined with this medication. Wait at least 2 hours after consuming 1 or 2 standard alcoholic drinks before taking this medication at bedtime. Do not take this medication at bedtime if you have consumed 3 or more standard alcoholic drinks that evening. BP slightly low today. She says it is normally  120s/80s and will monitor.   Screening for cervical cancer - Normal pap history. No longer screening per guidelines.   Return in 1 year  for annual.     Tamela Gammon DNP, 4:00 PM 02/13/2022

## 2022-02-14 ENCOUNTER — Telehealth: Payer: Self-pay | Admitting: *Deleted

## 2022-02-14 ENCOUNTER — Other Ambulatory Visit (HOSPITAL_COMMUNITY): Payer: Self-pay

## 2022-02-14 LAB — HIV ANTIBODY (ROUTINE TESTING W REFLEX): HIV 1&2 Ab, 4th Generation: NONREACTIVE

## 2022-02-14 LAB — SURESWAB® ADVANCED VAGINITIS PLUS,TMA
C. trachomatis RNA, TMA: NOT DETECTED
CANDIDA SPECIES: DETECTED — AB
Candida glabrata: NOT DETECTED
N. gonorrhoeae RNA, TMA: NOT DETECTED
SURESWAB(R) ADV BACTERIAL VAGINOSIS(BV),TMA: POSITIVE — AB
TRICHOMONAS VAGINALIS (TV),TMA: NOT DETECTED

## 2022-02-14 LAB — RPR: RPR Ser Ql: NONREACTIVE

## 2022-02-14 NOTE — Telephone Encounter (Signed)
PA done via cover my meds for Addyi 100 mg tablet, pending response from [place].

## 2022-02-15 ENCOUNTER — Encounter: Payer: Self-pay | Admitting: Nurse Practitioner

## 2022-02-15 ENCOUNTER — Other Ambulatory Visit (HOSPITAL_COMMUNITY): Payer: Self-pay

## 2022-02-15 MED ORDER — FLUCONAZOLE 150 MG PO TABS
ORAL_TABLET | ORAL | 0 refills | Status: DC
  Filled 2022-02-15: qty 2, 3d supply, fill #0

## 2022-02-15 MED ORDER — METRONIDAZOLE 0.75 % VA GEL
1.0000 | Freq: Every day | VAGINAL | 0 refills | Status: DC
  Filled 2022-02-15: qty 70, 5d supply, fill #0

## 2022-02-15 NOTE — Telephone Encounter (Signed)
Per Wende Crease via result note "+BV and yeast. Offer metrogel 0.75% x 5 nights and fluconazole '150mg'$  po once and repeat in 3 days "

## 2022-02-15 NOTE — Telephone Encounter (Signed)
Tiffany please see patient question about ADDYI cost.

## 2022-02-15 NOTE — Telephone Encounter (Signed)
Tiffany patient.

## 2022-02-20 NOTE — Telephone Encounter (Signed)
[employer] denied Rx stating medication is covered for premenopausal women. Patient does not have a diagnosis of premenopausal.

## 2022-02-21 ENCOUNTER — Encounter: Payer: Self-pay | Admitting: Specialist

## 2022-02-21 NOTE — Progress Notes (Signed)
Post-Op Visit Note   Patient: Penny Thomas           Date of Birth: 05/13/1986           MRN: [id number] Visit Date: 12/27/2021 PCP: Irene Pap, PA-C   Assessment & Plan:6 weeks post op lumbar laminectomy bilateral L5-S1 11/12/2021.   Chief Complaint:  Chief Complaint  Patient presents with   Lower Back - Follow-up  Complaints of low back pain, leg pain is better.  Still taking meds for pain.  No fever or chills. SLR negative. Incision is healed, no drainage or fluctuance.  Visit Diagnoses:  1. S/P lumbar discectomy     Plan: Avoid frequent bending and stooping  No lifting greater than 10 lbs. May use ice or moist heat for pain. Weight loss is of benefit. Best medication for lumbar disc disease is arthritis medications like motrin, celebrex and naprosyn. Exercise is important to improve your indurance and does allow people to function better inspite of back pain. Wean from use of narcotics.   Follow-Up Instructions: No follow-ups on file.   Orders:  No orders of the defined types were placed in this encounter.  No orders of the defined types were placed in this encounter.   Imaging: No results found.  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   Panic attacks 06/16/2019   Situational anxiety 06/16/2019   Great toe pain, left 06/16/2019   History of gestational diabetes 12/02/2018   Prediabetes 12/02/2018   Mild persistent asthma without complication 95/62/1308   Intractable chronic migraine without aura and without status migrainosus 06/23/2015   Migraines 03/23/2015   Right-sided low back pain without sciatica 03/23/2015   Dysmenorrhea  04/08/2013   Endometriosis 04/08/2013   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   Cough 05/31/2020   COVID-19 05/24/2020   H/O: hysterectomy 10/2019   Hemorrhage in uterus 01/20/2009   Hypertension    Migraines    Muscle cramps 65/78/4696   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   Postoperative state 12/01/2019   Pre-diabetes    Recurrent upper respiratory infection (URI)    Seasonal allergies    Sickle cell anemia (HCC)    trait   Sickle cell trait ([place])     Family History  Problem Relation Age of Onset   Fibromyalgia Mother    Multiple sclerosis Mother    Asthma Father    Healthy Maternal Grandmother    Healthy Maternal Grandfather    Healthy Paternal Grandmother    Healthy Paternal Grandfather    Endometriosis Sister    Healthy Brother    Healthy Child     Past Surgical History:  Procedure Laterality Date   ABDOMINAL HYSTERECTOMY     CESAREAN SECTION  09/28/2005   CESAREAN SECTION  01/20/2009  CESAREAN SECTION     DIAGNOSTIC LAPAROSCOPY     ENDOMETRIAL ABLATION  10/2011   HEROPTIOIN   HERNIA REPAIR     bilateral groins as a child   KNEE ARTHROSCOPY Right 02/16/2014   Procedure: RIGHT ARTHROSCOPY KNEE;  Surgeon: Kerin Salen, MD;  Location: [place];  Service: Orthopedics;  Laterality: Right;   LUMBAR LAMINECTOMY/DECOMPRESSION MICRODISCECTOMY N/A 11/12/2021   Procedure: BILATERAL LUMBAR FIVE - SACRAL ONE MICRODISCECTOMY;  Surgeon: Jessy Oto, MD;  Location: [place];  Service: Orthopedics;  Laterality: N/A;   POLYP REMOVAL  09/2006   ?CERVICAL OR UTERINE POLYP   ROBOTIC ASSISTED LAPAROSCOPIC HYSTERECTOMY AND SALPINGECTOMY Bilateral  12/01/2019   Procedure: XI ROBOTIC ASSISTEDTOTAL TOTAL LAPAROSCOPIC HYSTERECTOMY AND RIGHT SALPINGECTOMY , EXTENSIVE LYSIS OF ADHESIONS;  Surgeon: [name], MD;  Location: [place];  Service: Gynecology;  Laterality: Bilateral;  request 8:30am OR time. Requests 2 hours   TUBAL LIGATION  01/20/2009   DONE WITH C-SECTION   WISDOM TOOTH EXTRACTION Right 2014   Social History   Occupational History   Occupation: NT/Transporter  Tobacco Use   Smoking status: Never   Smokeless tobacco: Never  Vaping Use   Vaping Use: Never used  Substance and Sexual Activity   Alcohol use: Yes    Alcohol/week: 0.0 standard drinks of alcohol    Comment: seldom   Drug use: No   Sexual activity: Yes    Birth control/protection: Surgical    Comment: TUBAL LIGATION, Hyst, INTERCOURSE AGE 27, SEXUAL PARTNERS 5

## 2022-02-21 NOTE — Patient Instructions (Signed)
Plan: Avoid frequent bending and stooping  No lifting greater than 10 lbs. May use ice or moist heat for pain. Weight loss is of benefit. Best medication for lumbar disc disease is arthritis medications like motrin, celebrex and naprosyn. Exercise is important to improve your indurance and does allow people to function better inspite of back pain. Wean from use of narcotics.

## 2022-02-25 ENCOUNTER — Other Ambulatory Visit (HOSPITAL_COMMUNITY): Payer: Self-pay

## 2022-02-25 ENCOUNTER — Other Ambulatory Visit: Payer: Self-pay | Admitting: Surgery

## 2022-02-25 MED ORDER — METHOCARBAMOL 500 MG PO TABS
500.0000 mg | ORAL_TABLET | Freq: Three times a day (TID) | ORAL | 0 refills | Status: DC | PRN
  Filled 2022-02-25: qty 40, 14d supply, fill #0

## 2022-02-28 ENCOUNTER — Other Ambulatory Visit (HOSPITAL_COMMUNITY): Payer: Self-pay

## 2022-03-01 ENCOUNTER — Other Ambulatory Visit (HOSPITAL_COMMUNITY): Payer: Self-pay

## 2022-03-06 ENCOUNTER — Encounter: Payer: Self-pay | Admitting: Specialist

## 2022-03-06 ENCOUNTER — Ambulatory Visit: Payer: Self-pay

## 2022-03-06 ENCOUNTER — Other Ambulatory Visit (HOSPITAL_COMMUNITY): Payer: Self-pay

## 2022-03-06 ENCOUNTER — Ambulatory Visit (INDEPENDENT_AMBULATORY_CARE_PROVIDER_SITE_OTHER): Payer: BC Managed Care – PPO | Admitting: Specialist

## 2022-03-06 VITALS — BP 105/75 | HR 97 | Ht 67.0 in | Wt 187.0 lb

## 2022-03-06 DIAGNOSIS — M5137 Other intervertebral disc degeneration, lumbosacral region: Secondary | ICD-10-CM

## 2022-03-06 DIAGNOSIS — Z9889 Other specified postprocedural states: Secondary | ICD-10-CM | POA: Diagnosis not present

## 2022-03-06 MED ORDER — CYCLOBENZAPRINE HCL 10 MG PO TABS
10.0000 mg | ORAL_TABLET | Freq: Three times a day (TID) | ORAL | 0 refills | Status: DC | PRN
  Filled 2022-03-06: qty 30, 10d supply, fill #0

## 2022-03-06 MED ORDER — CELECOXIB 200 MG PO CAPS
200.0000 mg | ORAL_CAPSULE | Freq: Two times a day (BID) | ORAL | 3 refills | Status: DC
  Filled 2022-03-06: qty 60, 30d supply, fill #0
  Filled 2022-03-16: qty 60, 30d supply, fill #1

## 2022-03-06 NOTE — Therapy (Signed)
OUTPATIENT PHYSICAL THERAPY EVALUATION   Patient Name: Penny Thomas MRN: [id number] DOB:Dec 01, 1985, 36 y.o., female Today's Date: 03/08/2022  END OF SESSION:   PT End of Session - 03/08/22 0953     Visit Number 1    Number of Visits 20    Date for PT Re-Evaluation 05/17/22    Authorization Type [employer] $50 copay    Progress Note Due on Visit 10    PT Start Time 1015    PT Stop Time 1045    PT Time Calculation (min) 30 min    Activity Tolerance Patient tolerated treatment well    Behavior During Therapy [facility] for tasks assessed/performed             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   Cough 05/31/2020   COVID-19 05/24/2020   H/O: hysterectomy 10/2019   Hemorrhage in uterus 01/20/2009   Hypertension    Migraines    Muscle cramps 40/98/1191   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   Postoperative state 12/01/2019   Pre-diabetes    Recurrent upper respiratory infection (URI)    Seasonal allergies    Sickle cell anemia (HCC)    trait   Sickle cell trait ([place])    Past Surgical History:  Procedure Laterality Date   ABDOMINAL HYSTERECTOMY     CESAREAN SECTION  09/28/2005   CESAREAN SECTION  01/20/2009   CESAREAN SECTION     DIAGNOSTIC LAPAROSCOPY     ENDOMETRIAL ABLATION  10/2011   HEROPTIOIN   HERNIA REPAIR     bilateral groins as a child   KNEE ARTHROSCOPY Right 02/16/2014   Procedure: RIGHT ARTHROSCOPY KNEE;  Surgeon: Kerin Salen, MD;  Location: [place];  Service: Orthopedics;  Laterality: Right;   LUMBAR LAMINECTOMY/DECOMPRESSION MICRODISCECTOMY N/A 11/12/2021   Procedure: BILATERAL LUMBAR FIVE - SACRAL ONE  MICRODISCECTOMY;  Surgeon: Jessy Oto, MD;  Location: [place];  Service: Orthopedics;  Laterality: N/A;   POLYP REMOVAL  09/2006   ?CERVICAL OR UTERINE POLYP   ROBOTIC ASSISTED LAPAROSCOPIC HYSTERECTOMY AND SALPINGECTOMY Bilateral 12/01/2019   Procedure: XI ROBOTIC ASSISTEDTOTAL TOTAL LAPAROSCOPIC HYSTERECTOMY AND RIGHT SALPINGECTOMY , EXTENSIVE LYSIS OF ADHESIONS;  Surgeon: [name], MD;  Location: [place];  Service: Gynecology;  Laterality: Bilateral;  request 8:30am OR time. 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   Panic attacks 06/16/2019   Situational anxiety 06/16/2019   Great toe pain, left 06/16/2019   History of gestational diabetes 12/02/2018   Prediabetes 12/02/2018   Mild persistent asthma without complication 47/82/9562   Intractable chronic migraine without aura and without status migrainosus 06/23/2015   Migraines 03/23/2015   Right-sided low back pain without sciatica 03/23/2015  Dysmenorrhea 04/08/2013   Endometriosis 04/08/2013    PCP: Sherrilee Gilles   REFERRING PROVIDER: Jessy Oto, MD  REFERRING DIAG: [phone] (ICD-10-CM) - S/P lumbar discectomy  Rationale for Evaluation and Treatment Rehabilitation  THERAPY DIAG:  Other low back pain  Muscle weakness (generalized)  Difficulty in walking, not elsewhere classified  ONSET DATE: June 19th, 2023 surgery  SUBJECTIVE:                                                                                                                                                                                            SUBJECTIVE STATEMENT: Pt came to clinic today s/p microdiscectomy approx. 4 months ago.  Pt indicated that prior to surgery she had pain and difficulty with standing/bending and work that was 2-3 years of trouble.  Reported pain was in back and down Lt leg.  After surgery, Lt leg improved greatly.  Pt indicated having continued complaints at this time for bending and getting out of bed, transfers after sitting prolonged, standing prolonged.    PERTINENT HISTORY:  HTN, migraines, chronic back pain history, sickle cell trait  PAIN:  NPRS scale: at current 7/10, at worst 8/10, at best 6/10 Pain location: Lumbar Pain description: sharp, tight, constant Aggravating factors: transfers after inactivity, prolonged standing/walking.  Worse in morning usually Relieving factors: heat   PRECAUTIONS: Other: history of microdiscectomy  WEIGHT BEARING RESTRICTIONS No  FALLS:  Has patient fallen in last 6 months? No  LIVING ENVIRONMENT: Lives with: lives with their family Lives in: House/apartment Stairs: Split level house with stairs   OCCUPATION: Work requires lifting 20-30 lbs, standing/walking.  Haven't worked since before surgery  PLOF: Independent, previously walked and played with kids (limited at this time).  Previously would have done yard work/house work.   PATIENT GOALS  Reduce pain, get back to work, be able to do household activity.    OBJECTIVE:   PATIENT SURVEYS:  03/08/2022 FOTO intake: 43   predicted:  56  SCREENING FOR RED FLAGS: 03/08/2022 Bowel or bladder incontinence: No Cauda equina syndrome: No  COGNITION: 03/08/2022  Overall cognitive status: WFL normal    SENSATION: 03/08/2022 [facility]  MUSCLE LENGTH: 03/08/2022 Lt Supine hamstring testing (hip in 90 deg c knee extension to tolerance )=  lacking 54 degrees  POSTURE:  03/08/2022 no lateral shift noted in standing posture   PALPATION: 03/08/2022 Mild tenderness central lower lumbar, paraspinals  lower lumbar bilateral  LUMBAR ROM:    AROM  03/08/2022  Flexion To mid thigh c pain in back.    Extension 50 % WFL no pain increase  Repeated x 5 in standing,  improved to 75% WFL  Right lateral flexion To femoral epicondyle c mild lumbar pain  Left lateral flexion To femoral epicondyle c mild lumbar pain  Right rotation   Left rotation    (Blank rows = not tested)  LOWER EXTREMITY MMT:      Right 03/08/2022 Left 03/08/2022  Hip flexion 5/5 5/5  Hip extension    Hip abduction    Hip adduction    Hip internal rotation    Hip external rotation    Knee flexion 5/5 5/5  Knee extension 5/5 5/5  Ankle dorsiflexion 5/5 5/5          Lumbar flexion   In supine 2/5 (knee bent lifting)   (Blank rows = not tested)  LOWER EXTREMITY ROM:    MMT Right 03/08/2022 Left 03/08/2022  Hip flexion    Hip extension    Hip abduction    Hip adduction    Hip internal rotation    Hip external rotation    Knee flexion    Knee extension    Ankle dorsiflexion    Ankle plantarflexion    Ankle inversion    Ankle eversion     (Blank rows = not tested)  LUMBAR SPECIAL TESTS:  03/08/2022 (+) Slump mild pain in posterior Lt leg (-) on Rt  FUNCTIONAL TESTS:  03/08/2022 18 inch chair transfer:  on second try with back pain noted  GAIT: 03/08/2022 Independent ambulation    TODAY'S TREATMENT  03/08/2022 Therex:    HEP instruction/performance c cues for techniques, handout provided.  Trial set performed of each for comprehension and symptom assessment.  See below for exercise list   PATIENT EDUCATION:  03/08/2022 Education details: HEP, POC Person educated: Patient Education method: Explanation, Demonstration, Verbal cues, and Handouts Education comprehension: verbalized understanding, returned demonstration, and verbal cues required    HOME EXERCISE PROGRAM: Access Code: [email or web address] URL: [email or web address] Date: 03/08/2022 Prepared by: Scot Jun  Exercises - Supine Lower Trunk Rotation  - 1-2 x daily - 7 x weekly - 1 sets - 3-5 reps - 15 hold - Standing Lumbar Extension with Counter  - 3-5 x daily - 7 x weekly - 1 sets - 5-10 reps - Supine Single Knee to Chest Stretch (Mirrored)  - 1-2 x daily - 7 x weekly - 1 sets - 5 reps - 15 hold - Supine 90/90 Sciatic Nerve Glide with Knee Flexion/Extension (Mirrored)  - 1-2 x daily - 7 x weekly - 1-2 sets - 10 reps - Seated Multifidi Isometric  - 2-3 x daily - 7 x weekly - 1 sets - 10 reps - 5-10 hold  ASSESSMENT:  CLINICAL IMPRESSION: Patient is a 35 y.o. who comes to clinic with complaints of low back pain with mobility, strength and movement coordination deficits that impair their ability to perform usual daily and recreational functional activities without increase difficulty/symptoms at this time.  Patient to benefit from skilled PT services to address impairments and limitations to improve to previous level of function without restriction secondary to condition.    OBJECTIVE IMPAIRMENTS decreased activity tolerance, decreased coordination, decreased endurance, decreased mobility, difficulty walking, decreased ROM, decreased strength, hypomobility, increased fascial restrictions, impaired perceived functional ability, increased muscle spasms, impaired flexibility, improper body mechanics, and pain.   ACTIVITY LIMITATIONS carrying, lifting, bending, sitting, standing, squatting, transfers, bed mobility, and locomotion level  PARTICIPATION LIMITATIONS: cleaning, laundry, interpersonal relationship, shopping, community activity, occupation, and yard work  PERSONAL FACTORS Time since onset of injury/illness/exacerbation and HTN, migraines,  chronic back pain history, sickle cell trait  are also affecting patient's functional outcome.   REHAB POTENTIAL: Fair to good  CLINICAL DECISION MAKING: Stable/uncomplicated  EVALUATION COMPLEXITY: Low   GOALS: Goals reviewed with patient?  Yes  Short term PT Goals (target date for Short term goals are 3 weeks 03/29/2022) Patient will demonstrate independent use of home exercise program to maintain progress from in clinic treatments. Goal status: New   Long term PT goals (target dates for all long term goals are 10 weeks  05/17/2022 )   1. Patient will demonstrate/report pain at worst less than or equal to 2/10 to facilitate minimal limitation in daily activity secondary to pain symptoms. Goal status: New   2. Patient will demonstrate independent use of home exercise program to facilitate ability to maintain/progress functional gains from skilled physical therapy services. Goal status: New   3. Patient will demonstrate FOTO outcome > or = 56 % to indicate reduced disability due to condition. Goal status: New   4. Patient will demonstrate lumbar extension 100 % WFL s symptoms to facilitate upright standing, walking posture at PLOF s limitation. Goal status: New   5.  Patient will demonstrate lumbar flexion MMT in supine > or = 4/5 to facilitate improved stability in functional lifting/carrying tasks.    Goal status: New   6.  Patient will demonstrate/report ability to lift/carry 25 lbs for work related tasks.   Goal status: New     PLAN: PT FREQUENCY: 1-2x/week  PT DURATION: 10 weeks  PLANNED INTERVENTIONS: Therapeutic exercises, Therapeutic activity, Neuro Muscular re-education, Balance training, Gait training, Patient/Family education, Joint mobilization, Stair training, DME instructions, Dry Needling, Electrical stimulation, Cryotherapy, Moist heat, Taping, Traction Ultrasound, Ionotophoresis '4mg'$ /ml Dexamethasone, and Manual therapy.  All included unless contraindicated   PLAN FOR NEXT SESSION: Review HEP knowledge/results.  Possible manual mobility interventions for lumbar.  Progressive postural strengthening.    Scot Jun, PT, DPT, OCS, ATC 03/08/22  10:50 AM

## 2022-03-06 NOTE — Patient Instructions (Signed)
Avoid frequent bending and stooping  No lifting greater than 10 lbs. May use ice or moist heat for pain. Weight loss is of benefit. Best medication for lumbar disc disease is arthritis medications like motrin, celebrex and naprosyn. Exercise is important to improve your indurance and does allow people to function better inspite of back pain. Referral to PT for working on functional improvement Start celebrex Stop methocarbamol and start flexeril.

## 2022-03-06 NOTE — Progress Notes (Signed)
Office Visit Note   Patient: Penny Thomas           Date of Birth: 11-17-1985           MRN: [id number] Visit Date: 03/06/2022              Requested by: Irene Pap, PA-C No address on file PCP: Irene Pap, PA-C   Assessment & Plan: Visit Diagnoses:  1. S/P lumbar discectomy     Plan: Avoid frequent bending and stooping  No lifting greater than 10 lbs. May use ice or moist heat for pain. Weight loss is of benefit. Best medication for lumbar disc disease is arthritis medications like motrin, celebrex and naprosyn. Exercise is important to improve your indurance and does allow people to function better inspite of back pain. Referral to PT for working on functional improvement Start celebrex Stop methocarbamol and start flexeril.  Follow-Up Instructions: No follow-ups on file.   Orders:  Orders Placed This Encounter  Procedures   XR Lumb Spine Flex&Ext Only   No orders of the defined types were placed in this encounter.     Procedures: No procedures performed   Clinical Data: No additional findings.   Subjective: No chief complaint on file.   36 year old female with history of bilateral L5-S1 microdiscectomy for HNP with DDD. She reports still needing to take tramadol daily in the AM to allow her to get out of bed. She is able to walk and grocery shop but can not lift heavier items like a full hamper. 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 No bowel or bladder difficulty. Feels like pain is worsening with cool weather now 5-6 as opposed to 2-3 when weather was warmer. Using a heated seat help.   Review of Systems  Constitutional: Negative.   HENT: Negative.    Eyes: Negative.   Respiratory: Negative.    Cardiovascular: Negative.   Gastrointestinal: Negative.   Endocrine: Negative.    Genitourinary: Negative.   Musculoskeletal: Negative.   Skin: Negative.   Allergic/Immunologic: Negative.   Neurological: Negative.   Hematological: Negative.   Psychiatric/Behavioral: Negative.       Objective: Vital Signs: BP 105/75   Pulse 97   Ht '5\' 7"'$  (1.702 m)   Wt 187 lb (84.8 kg)   LMP 11/29/2015   BMI 29.29 kg/m   Physical Exam Constitutional:      Appearance: She is well-developed.  HENT:     Head: Normocephalic and atraumatic.  Eyes:     Pupils: Pupils are equal, round, and reactive to light.  Pulmonary:     Effort: Pulmonary effort is normal.     Breath sounds: Normal breath sounds.  Abdominal:     General: Bowel sounds are normal.     Palpations: Abdomen is soft.  Musculoskeletal:     Cervical back: Normal range of motion and neck supple.  Skin:    General: Skin is warm and dry.  Neurological:     Mental Status: She is alert and oriented to person, place, and time.  Psychiatric:        Behavior: Behavior normal.        Thought Content: Thought content normal.        Judgment: Judgment normal.    Back Exam   Tenderness  The patient  is experiencing tenderness in the lumbar.  Range of Motion  Extension:  60  Flexion:  40 abnormal   Muscle Strength  Right Quadriceps:  5/5  Left Quadriceps:  5/5  Right Hamstrings:  5/5  Left Hamstrings:  5/5   Other  Scars: present     Specialty Comments:  No specialty comments available.  Imaging: No results found.   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   Panic attacks 06/16/2019   Situational anxiety 06/16/2019   Great toe pain, left 06/16/2019   History of gestational diabetes 12/02/2018    Prediabetes 12/02/2018   Mild persistent asthma without complication 69/67/8938   Intractable chronic migraine without aura and without status migrainosus 06/23/2015   Migraines 03/23/2015   Right-sided low back pain without sciatica 03/23/2015   Dysmenorrhea 04/08/2013   Endometriosis 04/08/2013   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   Cough 05/31/2020   COVID-19 05/24/2020   H/O: hysterectomy 10/2019   Hemorrhage in uterus 01/20/2009   Hypertension    Migraines    Muscle cramps 03/12/5101   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   Postoperative state 12/01/2019   Pre-diabetes    Recurrent upper respiratory infection (URI)    Seasonal allergies    Sickle cell anemia (HCC)    trait   Sickle cell trait ([place])     Family History  Problem Relation Age of Onset   Fibromyalgia Mother    Multiple sclerosis Mother    Asthma Father    Healthy Maternal Grandmother    Healthy Maternal Grandfather    Healthy Paternal Grandmother    Healthy Paternal Grandfather    Endometriosis Sister    Healthy Brother    Healthy Child     Past Surgical History:  Procedure Laterality Date   ABDOMINAL HYSTERECTOMY     CESAREAN SECTION  09/28/2005   CESAREAN SECTION  01/20/2009   CESAREAN SECTION     DIAGNOSTIC LAPAROSCOPY     ENDOMETRIAL ABLATION  10/2011   HEROPTIOIN   HERNIA REPAIR     bilateral groins as a child   KNEE ARTHROSCOPY Right 02/16/2014   Procedure: RIGHT ARTHROSCOPY KNEE;  Surgeon: Kerin Salen, MD;  Location: [place];  Service: Orthopedics;  Laterality: Right;   LUMBAR LAMINECTOMY/DECOMPRESSION MICRODISCECTOMY N/A 11/12/2021   Procedure:  BILATERAL LUMBAR FIVE - SACRAL ONE MICRODISCECTOMY;  Surgeon: Jessy Oto, MD;  Location: [place];  Service: Orthopedics;  Laterality: N/A;   POLYP REMOVAL  09/2006   ?CERVICAL OR UTERINE POLYP   ROBOTIC ASSISTED LAPAROSCOPIC HYSTERECTOMY AND SALPINGECTOMY Bilateral 12/01/2019   Procedure: XI ROBOTIC ASSISTEDTOTAL TOTAL LAPAROSCOPIC HYSTERECTOMY AND RIGHT SALPINGECTOMY , EXTENSIVE LYSIS OF ADHESIONS;  Surgeon: [name], MD;  Location: [place];  Service: Gynecology;  Laterality: Bilateral;  request  8:30am OR time. Requests 2 hours   TUBAL LIGATION  01/20/2009   DONE WITH C-SECTION   WISDOM TOOTH EXTRACTION Right 2014   Social History   Occupational History   Occupation: NT/Transporter  Tobacco Use   Smoking status: Never   Smokeless tobacco: Never  Vaping Use   Vaping Use: Never used  Substance and Sexual Activity   Alcohol use: Yes    Alcohol/week: 0.0 standard drinks of alcohol    Comment: seldom   Drug use: No   Sexual activity: Yes    Birth control/protection: Surgical    Comment: TUBAL LIGATION, Hyst, INTERCOURSE AGE 9, SEXUAL PARTNERS 5

## 2022-03-07 ENCOUNTER — Encounter: Payer: Self-pay | Admitting: Specialist

## 2022-03-08 ENCOUNTER — Ambulatory Visit: Payer: BC Managed Care – PPO | Admitting: Rehabilitative and Restorative Service Providers"

## 2022-03-08 ENCOUNTER — Encounter: Payer: Self-pay | Admitting: Rehabilitative and Restorative Service Providers"

## 2022-03-08 ENCOUNTER — Other Ambulatory Visit: Payer: Self-pay

## 2022-03-08 DIAGNOSIS — M5459 Other low back pain: Secondary | ICD-10-CM | POA: Diagnosis not present

## 2022-03-08 DIAGNOSIS — R262 Difficulty in walking, not elsewhere classified: Secondary | ICD-10-CM

## 2022-03-08 DIAGNOSIS — M6281 Muscle weakness (generalized): Secondary | ICD-10-CM | POA: Diagnosis not present

## 2022-03-13 ENCOUNTER — Other Ambulatory Visit (HOSPITAL_COMMUNITY): Payer: Self-pay

## 2022-03-16 ENCOUNTER — Other Ambulatory Visit (HOSPITAL_COMMUNITY): Payer: Self-pay

## 2022-03-16 ENCOUNTER — Other Ambulatory Visit: Payer: Self-pay | Admitting: Specialist

## 2022-03-18 ENCOUNTER — Ambulatory Visit: Payer: BC Managed Care – PPO | Admitting: Physical Therapy

## 2022-03-18 ENCOUNTER — Encounter: Payer: Self-pay | Admitting: Physical Therapy

## 2022-03-18 DIAGNOSIS — R262 Difficulty in walking, not elsewhere classified: Secondary | ICD-10-CM

## 2022-03-18 DIAGNOSIS — M5459 Other low back pain: Secondary | ICD-10-CM

## 2022-03-18 DIAGNOSIS — M6281 Muscle weakness (generalized): Secondary | ICD-10-CM | POA: Diagnosis not present

## 2022-03-18 NOTE — Therapy (Signed)
OUTPATIENT PHYSICAL THERAPY TREATMENT NOTE   Patient Name: Penny Thomas MRN: [id number] DOB:July 26, 1985, 36 y.o., female Today's Date: 03/18/2022  PCP: Sherrilee Gilles REFERRING PROVIDER: Jessy Oto, MD  END OF SESSION:   PT End of Session - 03/18/22 1057     Visit Number 2    Number of Visits 20    Date for PT Re-Evaluation 05/17/22    Authorization Type [employer] $50 copay    Progress Note Due on Visit 10    PT Start Time 1100    PT Stop Time 1140    PT Time Calculation (min) 40 min    Activity Tolerance Patient tolerated treatment well    Behavior During Therapy [facility] for tasks assessed/performed             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   Cough 05/31/2020   COVID-19 05/24/2020   H/O: hysterectomy 10/2019   Hemorrhage in uterus 01/20/2009   Hypertension    Migraines    Muscle cramps 01/00/7121   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   Postoperative state 12/01/2019   Pre-diabetes    Recurrent upper respiratory infection (URI)    Seasonal allergies    Sickle cell anemia ([place])    trait   Sickle cell trait ([place])    Past Surgical History:  Procedure Laterality Date   ABDOMINAL HYSTERECTOMY     CESAREAN SECTION  09/28/2005   CESAREAN SECTION  01/20/2009   CESAREAN SECTION     DIAGNOSTIC LAPAROSCOPY     ENDOMETRIAL ABLATION  10/2011   HEROPTIOIN   HERNIA REPAIR     bilateral groins as a child   KNEE ARTHROSCOPY Right 02/16/2014   Procedure: RIGHT ARTHROSCOPY KNEE;  Surgeon: Kerin Salen, MD;  Location: [place];  Service: Orthopedics;  Laterality: Right;   LUMBAR LAMINECTOMY/DECOMPRESSION MICRODISCECTOMY N/A  11/12/2021   Procedure: BILATERAL LUMBAR FIVE - SACRAL ONE MICRODISCECTOMY;  Surgeon: Jessy Oto, MD;  Location: [place];  Service: Orthopedics;  Laterality: N/A;   POLYP REMOVAL  09/2006   ?CERVICAL OR UTERINE POLYP   ROBOTIC ASSISTED LAPAROSCOPIC HYSTERECTOMY AND SALPINGECTOMY Bilateral 12/01/2019   Procedure: XI ROBOTIC ASSISTEDTOTAL TOTAL LAPAROSCOPIC HYSTERECTOMY AND RIGHT SALPINGECTOMY , EXTENSIVE LYSIS OF ADHESIONS;  Surgeon: [name], MD;  Location: [place];  Service: Gynecology;  Laterality: Bilateral;  request 8:30am OR time. 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   Panic attacks 06/16/2019   Situational anxiety 06/16/2019   Great toe pain, left 06/16/2019   History of gestational diabetes 12/02/2018   Prediabetes 12/02/2018   Mild persistent asthma without complication 97/58/8325   Intractable chronic migraine without aura and without status migrainosus 06/23/2015  Migraines 03/23/2015   Right-sided low back pain without sciatica 03/23/2015   Dysmenorrhea 04/08/2013   Endometriosis 04/08/2013    REFERRING DIAG: Z98.890 (ICD-10-CM) - S/P lumbar discectomy  THERAPY DIAG:  Other low back pain  Muscle weakness (generalized)  Difficulty in walking, not elsewhere classified  Rationale for Evaluation and Treatment Rehabilitation  PERTINENT HISTORY: HTN, migraines, chronic back pain history, sickle cell trait  PRECAUTIONS: Other: history of microdiscectomy  SUBJECTIVE:                                                                                                                                                                                       SUBJECTIVE STATEMENT:  Pt arriving reporting compliance in her HEP. No pain at rest.    PAIN:  Are you in pain? No pain at rest   OBJECTIVE: (objective measures completed at initial evaluation unless otherwise dated)  PATIENT SURVEYS:  03/08/2022 FOTO intake: 43   predicted:  56   SCREENING FOR RED FLAGS: 03/08/2022 Bowel or bladder incontinence: No Cauda equina syndrome: No   COGNITION: 03/08/2022            Overall cognitive status: WFL normal              SENSATION: 03/08/2022 [facility]   MUSCLE LENGTH: 03/08/2022 Lt Supine hamstring testing (hip in 90 deg c knee extension to tolerance )=  lacking 54 degrees   POSTURE:  03/08/2022 no lateral shift noted in standing posture    PALPATION: 03/08/2022 Mild tenderness central lower lumbar, paraspinals lower lumbar bilateral   LUMBAR ROM:      AROM  03/08/2022  Flexion To mid thigh c pain in back.    Extension 50 % WFL no pain increase   Repeated x 5 in standing, improved to 75% WFL  Right lateral flexion To femoral epicondyle c mild lumbar pain  Left lateral flexion To femoral epicondyle c mild lumbar pain  Right rotation    Left rotation     (Blank rows = not tested)   LOWER EXTREMITY MMT:        Right 03/08/2022 Left 03/08/2022  Hip flexion 5/5 5/5  Hip extension      Hip abduction      Hip adduction      Hip internal rotation      Hip external rotation      Knee flexion 5/5 5/5  Knee extension 5/5 5/5  Ankle dorsiflexion 5/5 5/5                Lumbar flexion     In supine 2/5 (knee bent lifting)   (Blank rows = not tested)  LOWER EXTREMITY ROM:     MMT Right 03/08/2022 Left 03/08/2022  Hip flexion      Hip extension      Hip abduction      Hip adduction      Hip internal rotation      Hip external rotation      Knee flexion      Knee extension      Ankle dorsiflexion      Ankle plantarflexion      Ankle  inversion      Ankle eversion       (Blank rows = not tested)   LUMBAR SPECIAL TESTS:  03/08/2022 (+) Slump mild pain in posterior Lt leg (-) on Rt   FUNCTIONAL TESTS:  03/08/2022 18 inch chair transfer:  on second try with back pain noted   GAIT: 03/08/2022 Independent ambulation       TODAY'S TREATMENT  03/18/22:  TherEx:  Trunk rotation: x 4 each side holding 30 sec SKTC: x 4 each side holding 20 sec Bridges: x 10 holding 3 sec SLR: x 10 each LE with core activation Supine sciatic nerve glides: x 10 Trunk extension: standing Quadraped: cat/cow x 10 holding 5 seconds Quadraped: opposite arm/leg x 10 holding 5 sec Calf stretch x 2 holding  Leg Press: 3 x 10  50# Modalities: Moist heat to pt's low back x 10 minutes  03/08/2022 Therex:    HEP instruction/performance c cues for techniques, handout provided.  Trial set performed of each for comprehension and symptom assessment.  See below for exercise list     PATIENT EDUCATION:  03/08/2022 Education details: HEP, POC Person educated: Patient Education method: Explanation, Demonstration, Verbal cues, and Handouts Education comprehension: verbalized understanding, returned demonstration, and verbal cues required       HOME EXERCISE PROGRAM: Access Code: [email or web address] URL: [email or web address] Date: 03/08/2022 Prepared by: Scot Jun   Exercises - Supine Lower Trunk Rotation  - 1-2 x daily - 7 x weekly - 1 sets - 3-5 reps - 15 hold - Standing Lumbar Extension with Counter  - 3-5 x daily - 7 x weekly - 1 sets - 5-10 reps - Supine Single Knee to Chest Stretch (Mirrored)  - 1-2 x daily - 7 x weekly - 1 sets - 5 reps - 15 hold - Supine 90/90 Sciatic Nerve Glide with Knee Flexion/Extension (Mirrored)  - 1-2 x daily - 7 x weekly - 1-2 sets - 10 reps - Seated Multifidi Isometric  - 2-3 x daily - 7 x weekly - 1 sets - 10 reps - 5-10 hold   ASSESSMENT:   CLINICAL IMPRESSION: Pt arriving today reporting  no pain at rest. Pt stating she has been compliant in her HEP. Pt stating still bending over to pick something off the floor and getting in/out of car still increase her pain. Pt tolerating all exercises well. Continue skilled PT to maximize pt's function.      OBJECTIVE IMPAIRMENTS decreased activity tolerance, decreased coordination, decreased endurance, decreased mobility, difficulty walking, decreased ROM, decreased strength, hypomobility, increased fascial restrictions, impaired perceived functional ability, increased muscle spasms, impaired flexibility, improper body mechanics, and pain.    ACTIVITY LIMITATIONS carrying, lifting, bending, sitting, standing, squatting, transfers, bed mobility, and locomotion level   PARTICIPATION LIMITATIONS: cleaning, laundry, interpersonal relationship, shopping, community activity, occupation, and yard work   PERSONAL FACTORS Time since onset of injury/illness/exacerbation and HTN, migraines, chronic back pain history, sickle cell trait  are also affecting patient's functional outcome.  REHAB POTENTIAL: Fair to good   CLINICAL DECISION MAKING: Stable/uncomplicated   EVALUATION COMPLEXITY: Low     GOALS: Goals reviewed with patient? Yes   Short term PT Goals (target date for Short term goals are 3 weeks 03/29/2022) Patient will demonstrate independent use of home exercise program to maintain progress from in clinic treatments. Goal status: On-going 03/18/22   Long term PT goals (target dates for all long term goals are 10 weeks  05/17/2022 )   1. Patient will demonstrate/report pain at worst less than or equal to 2/10 to facilitate minimal limitation in daily activity secondary to pain symptoms. Goal status: New   2. Patient will demonstrate independent use of home exercise program to facilitate ability to maintain/progress functional gains from skilled physical therapy services. Goal status: New   3. Patient will demonstrate FOTO outcome >  or = 56 % to indicate reduced disability due to condition. Goal status: New   4. Patient will demonstrate lumbar extension 100 % WFL s symptoms to facilitate upright standing, walking posture at PLOF s limitation. Goal status: New   5.  Patient will demonstrate lumbar flexion MMT in supine > or = 4/5 to facilitate improved stability in functional lifting/carrying tasks.    Goal status: New   6.  Patient will demonstrate/report ability to lift/carry 25 lbs for work related tasks.   Goal status: New       PLAN: PT FREQUENCY: 1-2x/week   PT DURATION: 10 weeks   PLANNED INTERVENTIONS: Therapeutic exercises, Therapeutic activity, Neuro Muscular re-education, Balance training, Gait training, Patient/Family education, Joint mobilization, Stair training, DME instructions, Dry Needling, Electrical stimulation, Cryotherapy, Moist heat, Taping, Traction Ultrasound, Ionotophoresis '4mg'$ /ml Dexamethasone, and Manual therapy.  All included unless contraindicated     PLAN FOR NEXT SESSION:  Possible manual mobility interventions for lumbar.  Progressive postural strengthening.          Oretha Caprice, PT, MPT 03/18/2022, 11:34 AM

## 2022-03-21 ENCOUNTER — Encounter: Payer: Self-pay | Admitting: Rehabilitative and Restorative Service Providers"

## 2022-03-21 ENCOUNTER — Ambulatory Visit: Payer: BC Managed Care – PPO | Admitting: Rehabilitative and Restorative Service Providers"

## 2022-03-21 DIAGNOSIS — R262 Difficulty in walking, not elsewhere classified: Secondary | ICD-10-CM | POA: Diagnosis not present

## 2022-03-21 DIAGNOSIS — M5459 Other low back pain: Secondary | ICD-10-CM

## 2022-03-21 DIAGNOSIS — M6281 Muscle weakness (generalized): Secondary | ICD-10-CM | POA: Diagnosis not present

## 2022-03-21 NOTE — Therapy (Signed)
OUTPATIENT PHYSICAL THERAPY TREATMENT NOTE   Patient Name: Penny Thomas MRN: [id number] DOB:12/18/1985, 36 y.o., female Today's Date: 03/21/2022  PCP: Sherrilee Gilles REFERRING PROVIDER: Jessy Oto, MD  END OF SESSION:   PT End of Session - 03/21/22 0933     Visit Number 3    Number of Visits 20    Date for PT Re-Evaluation 05/17/22    Authorization Type [employer] $50 copay    Progress Note Due on Visit 10    PT Start Time 0929    PT Stop Time 1008    PT Time Calculation (min) 39 min    Activity Tolerance Patient tolerated treatment well    Behavior During Therapy [facility] for tasks assessed/performed              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   Cough 05/31/2020   COVID-19 05/24/2020   H/O: hysterectomy 10/2019   Hemorrhage in uterus 01/20/2009   Hypertension    Migraines    Muscle cramps 87/56/4332   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   Postoperative state 12/01/2019   Pre-diabetes    Recurrent upper respiratory infection (URI)    Seasonal allergies    Sickle cell anemia ([place])    trait   Sickle cell trait ([place])    Past Surgical History:  Procedure Laterality Date   ABDOMINAL HYSTERECTOMY     CESAREAN SECTION  09/28/2005   CESAREAN SECTION  01/20/2009   CESAREAN SECTION     DIAGNOSTIC LAPAROSCOPY     ENDOMETRIAL ABLATION  10/2011   HEROPTIOIN   HERNIA REPAIR     bilateral groins as a child   KNEE ARTHROSCOPY Right 02/16/2014   Procedure: RIGHT ARTHROSCOPY KNEE;  Surgeon: Kerin Salen, MD;  Location: [place];  Service: Orthopedics;  Laterality: Right;   LUMBAR LAMINECTOMY/DECOMPRESSION MICRODISCECTOMY N/A  11/12/2021   Procedure: BILATERAL LUMBAR FIVE - SACRAL ONE MICRODISCECTOMY;  Surgeon: Jessy Oto, MD;  Location: [place];  Service: Orthopedics;  Laterality: N/A;   POLYP REMOVAL  09/2006   ?CERVICAL OR UTERINE POLYP   ROBOTIC ASSISTED LAPAROSCOPIC HYSTERECTOMY AND SALPINGECTOMY Bilateral 12/01/2019   Procedure: XI ROBOTIC ASSISTEDTOTAL TOTAL LAPAROSCOPIC HYSTERECTOMY AND RIGHT SALPINGECTOMY , EXTENSIVE LYSIS OF ADHESIONS;  Surgeon: [name], MD;  Location: [place];  Service: Gynecology;  Laterality: Bilateral;  request 8:30am OR time. 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   Panic attacks 06/16/2019   Situational anxiety 06/16/2019   Great toe pain, left 06/16/2019   History of gestational diabetes 12/02/2018   Prediabetes 12/02/2018   Mild persistent asthma without complication 95/18/8416   Intractable chronic migraine without aura and without status migrainosus 06/23/2015  Migraines 03/23/2015   Right-sided low back pain without sciatica 03/23/2015   Dysmenorrhea 04/08/2013   Endometriosis 04/08/2013    REFERRING DIAG: Z98.890 (ICD-10-CM) - S/P lumbar discectomy  THERAPY DIAG:  Other low back pain  Muscle weakness (generalized)  Difficulty in walking, not elsewhere classified  Rationale for Evaluation and Treatment Rehabilitation  PERTINENT HISTORY: HTN, migraines, chronic back pain history, sickle cell trait  PRECAUTIONS: Other: history of microdiscectomy  SUBJECTIVE:                                                                                                                                                                                       SUBJECTIVE STATEMENT:  Pt indicated pain in middle back today , around a 6/10.  Pt indicated some exercises from last time hurt to do but in long run maybe helped.    PAIN:  Pain:  6/10 Location: low back  Description:  sharp pain Aggravating fx: getting out of bed Easing [id number] specific   OBJECTIVE: (objective measures completed at initial evaluation unless otherwise dated)  PATIENT SURVEYS:  03/08/2022 FOTO intake: 43   predicted:  56   SCREENING FOR RED FLAGS: 03/08/2022 Bowel or bladder incontinence: No Cauda equina syndrome: No   COGNITION: 03/08/2022            Overall cognitive status: WFL normal              SENSATION: 03/08/2022 [facility]   MUSCLE LENGTH: 03/08/2022 Lt Supine hamstring testing (hip in 90 deg c knee extension to tolerance )=  lacking 54 degrees   POSTURE:  03/08/2022 no lateral shift noted in standing posture    PALPATION: 03/08/2022 Mild tenderness central lower lumbar, paraspinals lower lumbar bilateral   LUMBAR ROM:      AROM  03/08/2022 AROM 03/21/2022  Flexion To mid thigh c pain in back.   To mid thigh c pain in back.    Extension 50 % WFL no pain increase   Repeated x 5 in standing, improved to 75% WFL 75 % WFL no pain  Right lateral flexion To femoral epicondyle c mild lumbar pain   Left lateral flexion To femoral epicondyle c mild lumbar pain   Right rotation     Left rotation      (Blank rows = not tested)   LOWER EXTREMITY MMT:        Right 03/08/2022 Left 03/08/2022  Hip flexion 5/5 5/5  Hip extension      Hip abduction      Hip adduction      Hip internal rotation      Hip external rotation  Knee flexion 5/5 5/5  Knee extension 5/5 5/5  Ankle dorsiflexion 5/5 5/5                Lumbar flexion     In supine 2/5 (knee bent lifting)   (Blank rows = not tested)   LOWER EXTREMITY ROM:     MMT Right 03/08/2022  Left 03/08/2022  Hip flexion      Hip extension      Hip abduction      Hip adduction      Hip internal rotation      Hip external rotation      Knee flexion      Knee extension      Ankle dorsiflexion      Ankle plantarflexion      Ankle inversion      Ankle eversion       (Blank rows = not tested)   LUMBAR SPECIAL TESTS:  03/08/2022 (+) Slump mild pain in posterior Lt leg (-) on Rt   FUNCTIONAL TESTS:  03/08/2022 18 inch chair transfer:  on second try with back pain noted   GAIT: 03/08/2022 Independent ambulation       TODAY'S TREATMENT  03/21/2022: Therex: Standing lumbar extension x 3 Lumbar trunk rotation stretch 15 sec x 3 bilateral Single knee to chest 15 sec hold x 3 with contralateral leg outstretched Supine bridge 3 sec hold x 15 Quadruped arm/leg reach opposite 3 sec hold x 10 bilateral Seated pball press down 5 sec hold x 10  Seated multifidi table lift 5 sec hold x 10   Nustep lvl 5 10 mins UE/LE - SPM around 60     03/18/22:  TherEx:  Trunk rotation: x 4 each side holding 30 sec SKTC: x 4 each side holding 20 sec Bridges: x 10 holding 3 sec SLR: x 10 each LE with core activation Supine sciatic nerve glides: x 10 Trunk extension: standing Quadraped: cat/cow x 10 holding 5 seconds Quadraped: opposite arm/leg x 10 holding 5 sec Calf stretch x 2 holding  Leg Press: 3 x 10  50# 10 Modalities: Moist heat to pt's low back x 10 minutes  03/08/2022 Therex:    HEP instruction/performance c cues for techniques, handout provided.  Trial set performed of each for comprehension and symptom assessment.  See below for exercise list     PATIENT EDUCATION:  03/08/2022 Education details: HEP, POC Person educated: Patient Education method: Explanation, Demonstration, Verbal cues, and Handouts Education comprehension: verbalized understanding, returned demonstration, and verbal cues required     HOME EXERCISE PROGRAM: Access Code: [email or web address] URL:  [email or web address] Date: 03/21/2022 Prepared by: Scot Jun  Exercises - Supine Lower Trunk Rotation  - 1-2 x daily - 7 x weekly - 1 sets - 3-5 reps - 15 hold - Standing Lumbar Extension with Counter  - 3-5 x daily - 7 x weekly - 1 sets - 5-10 reps - Supine Single Knee to Chest Stretch (Mirrored)  - 1-2 x daily - 7 x weekly - 1 sets - 5 reps - 15 hold - Supine 90/90 Sciatic Nerve Glide with Knee Flexion/Extension (Mirrored)  - 1-2 x daily - 7 x weekly - 1-2 sets - 10 reps - Seated Multifidi Isometric  - 2-3 x daily - 7 x weekly - 1 sets - 10 reps - 5-10 hold - Supine Bridge  - 1-2 x daily - 7 x weekly - 1-2 sets - 10 reps - 2 hold   ASSESSMENT:   CLINICAL  IMPRESSION: Continued complaints in lumbar based flexion movements noted within clinic and at home.  Continued focus on improving general mobility for lumbar and improved lumbar/core and hip strength to facilitate improved tolerance to functional activity.  Reviewed specific exercises for pain relief strategy today. Pt arrived with pain and left with reduced symptoms after performance of intervention today.     OBJECTIVE IMPAIRMENTS decreased activity tolerance, decreased coordination, decreased endurance, decreased mobility, difficulty walking, decreased ROM, decreased strength, hypomobility, increased fascial restrictions, impaired perceived functional ability, increased muscle spasms, impaired flexibility, improper body mechanics, and pain.    ACTIVITY LIMITATIONS carrying, lifting, bending, sitting, standing, squatting, transfers, bed mobility, and locomotion level   PARTICIPATION LIMITATIONS: cleaning, laundry, interpersonal relationship, shopping, community activity, occupation, and yard work   PERSONAL FACTORS Time since onset of injury/illness/exacerbation and HTN, migraines, chronic back pain history, sickle cell trait  are also affecting patient's functional outcome.    REHAB POTENTIAL: Fair to good    CLINICAL DECISION MAKING: Stable/uncomplicated   EVALUATION COMPLEXITY: Low     GOALS: Goals reviewed with patient? Yes   Short term PT Goals (target date for Short term goals are 3 weeks 03/29/2022) Patient will demonstrate independent use of home exercise program to maintain progress from in clinic treatments. Goal status: On-going 03/18/22   Long term PT goals (target dates for all long term goals are 10 weeks  05/17/2022 )   1. Patient will demonstrate/report pain at worst less than or equal to 2/10 to facilitate minimal limitation in daily activity secondary to pain symptoms. Goal status: on going - assessed 03/21/2022   2. Patient will demonstrate independent use of home exercise program to facilitate ability to maintain/progress functional gains from skilled physical therapy services. Goal status: on going - assessed 03/21/2022   3. Patient will demonstrate FOTO outcome > or = 56 % to indicate reduced disability due to condition. Goal status: on going - assessed 03/21/2022   4. Patient will demonstrate lumbar extension 100 % WFL s symptoms to facilitate upright standing, walking posture at PLOF s limitation. Goal status: on going - assessed 03/21/2022   5.  Patient will demonstrate lumbar flexion MMT in supine > or = 4/5 to facilitate improved stability in functional lifting/carrying tasks.    Goal status: on going - assessed 03/21/2022   6.  Patient will demonstrate/report ability to lift/carry 25 lbs for work related tasks.   Goal status: on going - assessed 03/21/2022       PLAN: PT FREQUENCY: 1-2x/week   PT DURATION: 10 weeks   PLANNED INTERVENTIONS: Therapeutic exercises, Therapeutic activity, Neuro Muscular re-education, Balance training, Gait training, Patient/Family education, Joint mobilization, Stair training, DME instructions, Dry Needling, Electrical stimulation, Cryotherapy, Moist heat, Taping, Traction Ultrasound, Ionotophoresis '4mg'$ /ml Dexamethasone, and  Manual therapy.  All included unless contraindicated     PLAN FOR NEXT SESSION:  Continue progressive strengthening and improvements in flexion mobility tolerance as appropriate for functional activity.      Scot Jun, PT, DPT, OCS, ATC 03/21/22  10:07 AM

## 2022-03-22 ENCOUNTER — Other Ambulatory Visit: Payer: Self-pay | Admitting: Specialist

## 2022-03-22 ENCOUNTER — Other Ambulatory Visit (HOSPITAL_COMMUNITY): Payer: Self-pay

## 2022-03-22 MED ORDER — GABAPENTIN 100 MG PO CAPS
200.0000 mg | ORAL_CAPSULE | Freq: Two times a day (BID) | ORAL | 1 refills | Status: DC
  Filled 2022-03-22: qty 40, 10d supply, fill #0
  Filled 2022-04-03: qty 40, 10d supply, fill #1

## 2022-03-25 ENCOUNTER — Encounter: Payer: BC Managed Care – PPO | Admitting: Rehabilitative and Restorative Service Providers"

## 2022-03-25 ENCOUNTER — Other Ambulatory Visit (HOSPITAL_COMMUNITY): Payer: Self-pay

## 2022-03-25 ENCOUNTER — Other Ambulatory Visit: Payer: Self-pay | Admitting: Specialist

## 2022-03-25 MED ORDER — CYCLOBENZAPRINE HCL 10 MG PO TABS
10.0000 mg | ORAL_TABLET | Freq: Three times a day (TID) | ORAL | 0 refills | Status: DC | PRN
  Filled 2022-03-25: qty 30, 10d supply, fill #0

## 2022-03-26 ENCOUNTER — Other Ambulatory Visit (HOSPITAL_COMMUNITY): Payer: Self-pay

## 2022-03-27 ENCOUNTER — Encounter: Payer: Self-pay | Admitting: Rehabilitative and Restorative Service Providers"

## 2022-03-27 ENCOUNTER — Ambulatory Visit: Payer: BC Managed Care – PPO | Admitting: Rehabilitative and Restorative Service Providers"

## 2022-03-27 DIAGNOSIS — M6281 Muscle weakness (generalized): Secondary | ICD-10-CM

## 2022-03-27 DIAGNOSIS — M5459 Other low back pain: Secondary | ICD-10-CM | POA: Diagnosis not present

## 2022-03-27 DIAGNOSIS — R262 Difficulty in walking, not elsewhere classified: Secondary | ICD-10-CM

## 2022-03-27 NOTE — Therapy (Signed)
OUTPATIENT PHYSICAL THERAPY TREATMENT NOTE   Patient Name: Penny Thomas MRN: [id number] DOB:1986/03/15, 36 y.o., female Today's Date: 03/27/2022  PCP: Sherrilee Gilles REFERRING PROVIDER: Jessy Oto, MD  END OF SESSION:   PT End of Session - 03/27/22 0845     Visit Number 4    Number of Visits 20    Date for PT Re-Evaluation 05/17/22    Authorization Type [employer] $50 copay    Progress Note Due on Visit 10    PT Start Time 0843    PT Stop Time 0923    PT Time Calculation (min) 40 min    Activity Tolerance Patient tolerated treatment well    Behavior During Therapy [facility] for tasks assessed/performed               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   Cough 05/31/2020   COVID-19 05/24/2020   H/O: hysterectomy 10/2019   Hemorrhage in uterus 01/20/2009   Hypertension    Migraines    Muscle cramps 32/95/1884   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   Postoperative state 12/01/2019   Pre-diabetes    Recurrent upper respiratory infection (URI)    Seasonal allergies    Sickle cell anemia ([place])    trait   Sickle cell trait ([place])    Past Surgical History:  Procedure Laterality Date   ABDOMINAL HYSTERECTOMY     CESAREAN SECTION  09/28/2005   CESAREAN SECTION  01/20/2009   CESAREAN SECTION     DIAGNOSTIC LAPAROSCOPY     ENDOMETRIAL ABLATION  10/2011   HEROPTIOIN   HERNIA REPAIR     bilateral groins as a child   KNEE ARTHROSCOPY Right 02/16/2014   Procedure: RIGHT ARTHROSCOPY KNEE;  Surgeon: Kerin Salen, MD;  Location: [place];  Service: Orthopedics;  Laterality: Right;   LUMBAR LAMINECTOMY/DECOMPRESSION MICRODISCECTOMY N/A  11/12/2021   Procedure: BILATERAL LUMBAR FIVE - SACRAL ONE MICRODISCECTOMY;  Surgeon: Jessy Oto, MD;  Location: [place];  Service: Orthopedics;  Laterality: N/A;   POLYP REMOVAL  09/2006   ?CERVICAL OR UTERINE POLYP   ROBOTIC ASSISTED LAPAROSCOPIC HYSTERECTOMY AND SALPINGECTOMY Bilateral 12/01/2019   Procedure: XI ROBOTIC ASSISTEDTOTAL TOTAL LAPAROSCOPIC HYSTERECTOMY AND RIGHT SALPINGECTOMY , EXTENSIVE LYSIS OF ADHESIONS;  Surgeon: [name], MD;  Location: [place];  Service: Gynecology;  Laterality: Bilateral;  request 8:30am OR time. 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   Panic attacks 06/16/2019   Situational anxiety 06/16/2019   Great toe pain, left 06/16/2019   History of gestational diabetes 12/02/2018   Prediabetes 12/02/2018   Mild persistent asthma without complication 16/60/6301   Intractable chronic migraine without aura and without status migrainosus 06/23/2015  Migraines 03/23/2015   Right-sided low back pain without sciatica 03/23/2015   Dysmenorrhea 04/08/2013   Endometriosis 04/08/2013    REFERRING DIAG: Z98.890 (ICD-10-CM) - S/P lumbar discectomy  THERAPY DIAG:  Other low back pain  Muscle weakness (generalized)  Difficulty in walking, not elsewhere classified  Rationale for Evaluation and Treatment Rehabilitation  PERTINENT HISTORY: HTN, migraines, chronic back pain history, sickle cell trait  PRECAUTIONS: Other: history of microdiscectomy  SUBJECTIVE:                                                                                                                                                                                       SUBJECTIVE STATEMENT:   Pt indicated feeling this morning and other mornings when its cold are harder to get moving.   Pt indicated not trying any exercise this morning for symptoms yet.     PAIN:  Pain:  8/10 Location: low back  Description:  sharp pain Aggravating fx: getting out of bed Easing EY:CXKGYJE specific   OBJECTIVE: (objective measures completed at initial evaluation unless otherwise dated)  PATIENT SURVEYS:  03/08/2022 FOTO intake: 43   predicted:  56   SCREENING FOR RED FLAGS: 03/08/2022 Bowel or bladder incontinence: No Cauda equina syndrome: No   COGNITION: 03/08/2022            Overall cognitive status: WFL normal              SENSATION: 03/08/2022 [facility]   MUSCLE LENGTH: 03/08/2022 Lt Supine hamstring testing (hip in 90 deg c knee extension to tolerance )=  lacking 54 degrees   POSTURE:  03/08/2022 no lateral shift noted in standing posture    PALPATION: 03/08/2022 Mild tenderness central lower lumbar, paraspinals lower lumbar bilateral   LUMBAR ROM:      AROM  03/08/2022 AROM 03/21/2022  Flexion To mid thigh c pain in back.   To mid thigh c pain in back.    Extension 50 % WFL no pain increase   Repeated x 5 in standing, improved to 75% WFL 75 % WFL no pain  Right lateral flexion To femoral epicondyle c mild lumbar pain   Left lateral flexion To femoral epicondyle c mild lumbar pain   Right rotation     Left rotation      (Blank rows = not tested)   LOWER EXTREMITY MMT:        Right 03/08/2022 Left 03/08/2022 03/27/2022  Hip flexion 5/5 5/5   Hip extension       Hip abduction       Hip adduction       Hip internal rotation  Hip external rotation       Knee flexion 5/5 5/5   Knee extension 5/5 5/5   Ankle dorsiflexion 5/5 5/5                   Lumbar flexion     In supine 2/5 (knee bent lifting) In supine 3+/5   (Blank rows = not tested)    LOWER EXTREMITY ROM:     MMT Right 03/08/2022 Left 03/08/2022  Hip flexion      Hip extension      Hip abduction      Hip adduction      Hip internal rotation      Hip external rotation      Knee flexion      Knee extension      Ankle dorsiflexion      Ankle plantarflexion      Ankle inversion      Ankle eversion       (Blank rows = not tested)   LUMBAR SPECIAL TESTS:  03/08/2022 (+) Slump mild pain in posterior Lt leg (-) on Rt   FUNCTIONAL TESTS:  03/08/2022 18 inch chair transfer:  on second try with back pain noted   GAIT: 03/08/2022 Independent ambulation       TODAY'S TREATMENT  03/27/2022: Therex: Nustep Lvl 5 10 mins UE/LE Single knee to chest 15 sec hold x 3 with contralateral leg outstretched Figure 4 push away and pull towards 15 sec x 3 each, performed bilaterally Supine lumbar trunk rotation 15 sec x 3 bilateral Supine bridge x 15 3 sec hold  Supine SLR c focus on core stability hold x 15 bilateral slowly Standing lumbar extension x 5 AROM  Sit to stand to sit transfer s UE assist with slow lowering from 22 inch table height (limited to go lower due to pain complaints)    03/21/2022: Therex: Standing lumbar extension x 3 Lumbar trunk rotation stretch 15 sec x 3 bilateral Single knee to chest 15 sec hold x 3 with contralateral leg outstretched Supine bridge 3 sec hold x 15 Quadruped arm/leg reach opposite 3 sec hold x 10 bilateral Seated pball press down 5 sec hold x 10  Seated multifidi table lift 5 sec hold x 10 Nustep lvl 5 10 mins UE/LE - SPM around 60    03/18/22:  TherEx:  Trunk rotation: x 4 each side holding 30 sec SKTC: x 4 each side holding 20 sec Bridges: x 10 holding 3 sec SLR: x 10 each LE with core activation Supine sciatic nerve glides: x 10 Trunk extension: standing Quadraped: cat/cow x 10 holding 5 seconds Quadraped: opposite arm/leg x 10 holding 5 sec Calf stretch x 2 holding  Leg Press: 3 x 10   50# 10 Modalities: Moist heat to pt's low back x 10 minutes     PATIENT EDUCATION:  03/08/2022 Education details: HEP, POC Person educated: Patient Education method: Consulting civil engineer, Media planner, Verbal cues, and Handouts Education comprehension: verbalized understanding, returned demonstration, and verbal cues required     HOME EXERCISE PROGRAM: Access Code: [email or web address] URL: [email or web address] Date: 03/21/2022 Prepared by: Scot Jun  Exercises - Supine Lower Trunk Rotation  - 1-2 x daily - 7 x weekly - 1 sets - 3-5 reps - 15 hold - Standing Lumbar Extension with Counter  - 3-5 x daily - 7 x weekly - 1 sets - 5-10 reps - Supine Single Knee to Chest Stretch (Mirrored)  - 1-2 x daily - 7 x weekly -  1 sets - 5 reps - 15 hold - Supine 90/90 Sciatic Nerve Glide with Knee Flexion/Extension (Mirrored)  - 1-2 x daily - 7 x weekly - 1-2 sets - 10 reps - Seated Multifidi Isometric  - 2-3 x daily - 7 x weekly - 1 sets - 10 reps - 5-10 hold - Supine Bridge  - 1-2 x daily - 7 x weekly - 1-2 sets - 10 reps - 2 hold   ASSESSMENT:   CLINICAL IMPRESSION: Encouraged use of HEP for mobility improvements for symptom relief in times of symptoms, specific in mornings when symptoms are noted.  Mild mobility deficits in lumbar noted at this time that can continue to improve c HEP and skilled PT services as well as intervention to promote strengthening.     OBJECTIVE IMPAIRMENTS decreased activity tolerance, decreased coordination, decreased endurance, decreased mobility, difficulty walking, decreased ROM, decreased strength, hypomobility, increased fascial restrictions, impaired perceived functional ability, increased muscle spasms, impaired flexibility, improper body mechanics, and pain.    ACTIVITY LIMITATIONS carrying, lifting, bending, sitting, standing, squatting, transfers, bed mobility, and locomotion level   PARTICIPATION LIMITATIONS: cleaning, laundry, interpersonal  relationship, shopping, community activity, occupation, and yard work   PERSONAL FACTORS Time since onset of injury/illness/exacerbation and HTN, migraines, chronic back pain history, sickle cell trait  are also affecting patient's functional outcome.    REHAB POTENTIAL: Fair to good   CLINICAL DECISION MAKING: Stable/uncomplicated   EVALUATION COMPLEXITY: Low     GOALS: Goals reviewed with patient? Yes   Short term PT Goals (target date for Short term goals are 3 weeks 03/29/2022) Patient will demonstrate independent use of home exercise program to maintain progress from in clinic treatments. Goal status: On-going 03/18/22   Long term PT goals (target dates for all long term goals are 10 weeks  05/17/2022 )   1. Patient will demonstrate/report pain at worst less than or equal to 2/10 to facilitate minimal limitation in daily activity secondary to pain symptoms. Goal status: on going - assessed 03/21/2022   2. Patient will demonstrate independent use of home exercise program to facilitate ability to maintain/progress functional gains from skilled physical therapy services. Goal status: on going - assessed 03/21/2022   3. Patient will demonstrate FOTO outcome > or = 56 % to indicate reduced disability due to condition. Goal status: on going - assessed 03/21/2022   4. Patient will demonstrate lumbar extension 100 % WFL s symptoms to facilitate upright standing, walking posture at PLOF s limitation. Goal status: on going - assessed 03/21/2022   5.  Patient will demonstrate lumbar flexion MMT in supine > or = 4/5 to facilitate improved stability in functional lifting/carrying tasks.    Goal status: on going - assessed 03/21/2022   6.  Patient will demonstrate/report ability to lift/carry 25 lbs for work related tasks.   Goal status: on going - assessed 03/21/2022       PLAN: PT FREQUENCY: 1-2x/week   PT DURATION: 10 weeks   PLANNED INTERVENTIONS: Therapeutic exercises,  Therapeutic activity, Neuro Muscular re-education, Balance training, Gait training, Patient/Family education, Joint mobilization, Stair training, DME instructions, Dry Needling, Electrical stimulation, Cryotherapy, Moist heat, Taping, Traction Ultrasound, Ionotophoresis '4mg'$ /ml Dexamethasone, and Manual therapy.  All included unless contraindicated     PLAN FOR NEXT SESSION:  Continue progressive strengthening as tolerated for back/LE.      Scot Jun, PT, DPT, OCS, ATC 03/27/22  9:20 AM

## 2022-04-01 ENCOUNTER — Ambulatory Visit: Payer: BC Managed Care – PPO | Admitting: Rehabilitative and Restorative Service Providers"

## 2022-04-01 ENCOUNTER — Encounter: Payer: Self-pay | Admitting: Rehabilitative and Restorative Service Providers"

## 2022-04-01 DIAGNOSIS — R262 Difficulty in walking, not elsewhere classified: Secondary | ICD-10-CM

## 2022-04-01 DIAGNOSIS — M6281 Muscle weakness (generalized): Secondary | ICD-10-CM | POA: Diagnosis not present

## 2022-04-01 DIAGNOSIS — M5459 Other low back pain: Secondary | ICD-10-CM

## 2022-04-01 NOTE — Therapy (Addendum)
OUTPATIENT PHYSICAL THERAPY TREATMENT NOTE /PROGRESS NOTE /DISCHARGE   Patient Name: Penny Thomas MRN: [id number] DOB:26-Feb-1986, 36 y.o., female Today's Date: 04/01/2022  PCP: Sherrilee Gilles REFERRING PROVIDER: Jessy Oto, MD  Progress Note Reporting Period 03/08/2022 to 04/01/2022  See note below for Objective Data and Assessment of Progress/Goals.    END OF SESSION:   PT End of Session - 04/01/22 1156     Visit Number 5    Number of Visits 20    Date for PT Re-Evaluation 05/17/22    Authorization Type [employer] $50 copay    Progress Note Due on Visit 15    PT Start Time 1146    PT Stop Time 1224    PT Time Calculation (min) 38 min    Activity Tolerance Patient tolerated treatment well    Behavior During Therapy WFL for tasks assessed/performed                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   Cough 05/31/2020   COVID-19 05/24/2020   H/O: hysterectomy 10/2019   Hemorrhage in uterus 01/20/2009   Hypertension    Migraines    Muscle cramps 40/98/1191   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   Postoperative state 12/01/2019   Pre-diabetes    Recurrent upper respiratory infection (URI)    Seasonal allergies    Sickle cell anemia ([place])    trait   Sickle cell trait ([place])    Past Surgical History:  Procedure Laterality Date   ABDOMINAL HYSTERECTOMY     CESAREAN SECTION  09/28/2005   CESAREAN SECTION  01/20/2009   CESAREAN SECTION     DIAGNOSTIC LAPAROSCOPY     ENDOMETRIAL ABLATION  10/2011   HEROPTIOIN   HERNIA REPAIR     bilateral groins as a child   KNEE ARTHROSCOPY Right 02/16/2014   Procedure: RIGHT ARTHROSCOPY KNEE;   Surgeon: Kerin Salen, MD;  Location: [place];  Service: Orthopedics;  Laterality: Right;   LUMBAR LAMINECTOMY/DECOMPRESSION MICRODISCECTOMY N/A 11/12/2021   Procedure: BILATERAL LUMBAR FIVE - SACRAL ONE MICRODISCECTOMY;  Surgeon: Jessy Oto, MD;  Location: [place];  Service: Orthopedics;  Laterality: N/A;   POLYP REMOVAL  09/2006   ?CERVICAL OR UTERINE POLYP   ROBOTIC ASSISTED LAPAROSCOPIC HYSTERECTOMY AND SALPINGECTOMY Bilateral 12/01/2019   Procedure: XI ROBOTIC ASSISTEDTOTAL TOTAL LAPAROSCOPIC HYSTERECTOMY AND RIGHT SALPINGECTOMY , EXTENSIVE LYSIS OF ADHESIONS;  Surgeon: [name], MD;  Location: [place];  Service: Gynecology;  Laterality: Bilateral;  request 8:30am OR time. 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   Panic attacks 06/16/2019   Situational anxiety 06/16/2019   Great toe pain, left 06/16/2019   History of gestational diabetes  12/02/2018   Prediabetes 12/02/2018   Mild persistent asthma without complication 50/56/9794   Intractable chronic migraine without aura and without status migrainosus 06/23/2015   Migraines 03/23/2015   Right-sided low back pain without sciatica 03/23/2015   Dysmenorrhea 04/08/2013   Endometriosis 04/08/2013    REFERRING DIAG: Z98.890 (ICD-10-CM) - S/P lumbar discectomy  THERAPY DIAG:  Other low back pain  Muscle weakness (generalized)  Difficulty in walking, not elsewhere classified  Rationale for Evaluation and Treatment Rehabilitation  PERTINENT HISTORY: HTN, migraines, chronic back pain history, sickle cell  trait  PRECAUTIONS: Other: history of microdiscectomy  SUBJECTIVE:                                                                                                                                                                                      SUBJECTIVE STATEMENT:   Pt reported having pain increase day of parade for homecoming with lots of standing/walking.  Reported taking medicine today which reduced complaints of pain to lower.    PAIN:  Pain:  3-4/10 Location: low back  Description:  sharp pain Aggravating fx: getting out of bed Easing IA:[id number] specific   OBJECTIVE: (objective measures completed at initial evaluation unless otherwise dated)  PATIENT SURVEYS:  04/01/2022: FOTO update: 46  03/08/2022 FOTO intake: 43   predicted:  56   SCREENING FOR RED FLAGS: 03/08/2022 Bowel or bladder incontinence: No Cauda equina syndrome: No   COGNITION: 03/08/2022            Overall cognitive status: WFL normal              SENSATION: 03/08/2022 [facility]   MUSCLE LENGTH: 03/08/2022 Lt Supine hamstring testing (hip in 90 deg c knee extension to tolerance )=  lacking 54 degrees   POSTURE:  03/08/2022 no lateral shift noted in standing posture    PALPATION: 03/08/2022 Mild tenderness central lower lumbar, paraspinals lower lumbar bilateral   LUMBAR ROM:      AROM  03/08/2022 AROM 03/21/2022 AROM 04/01/2022  Flexion To mid thigh c pain in back.   To mid thigh c pain in back.   To mid thigh c pain in back.    Extension 50 % WFL no pain increase   Repeated x 5 in standing, improved to 75% WFL 75 % WFL no pain 100 % WFL c relief  Right lateral flexion To femoral epicondyle c mild lumbar pain    Left lateral flexion To femoral epicondyle c mild lumbar pain    Right rotation      Left rotation       (Blank rows = not tested)   LOWER EXTREMITY MMT:  Right 03/08/2022 Left 03/08/2022 03/27/2022  Hip flexion 5/5 5/5   Hip extension       Hip abduction       Hip  adduction       Hip internal rotation       Hip external rotation       Knee flexion 5/5 5/5   Knee extension 5/5 5/5   Ankle dorsiflexion 5/5 5/5                   Lumbar flexion     In supine 2/5 (knee bent lifting) In supine 3+/5   (Blank rows = not tested)   LOWER EXTREMITY ROM:     MMT Right 03/08/2022 Left 03/08/2022  Hip flexion      Hip extension      Hip abduction      Hip adduction      Hip internal rotation      Hip external rotation      Knee flexion      Knee extension      Ankle dorsiflexion      Ankle plantarflexion      Ankle inversion      Ankle eversion       (Blank rows = not tested)   LUMBAR SPECIAL TESTS:  03/08/2022 (+) Slump mild pain in posterior Lt leg (-) on Rt   FUNCTIONAL TESTS:  03/08/2022 18 inch chair transfer:  on second try with back pain noted   GAIT: 03/08/2022 Independent ambulation       TODAY'S TREATMENT  04/01/2022: Therex: Nustep Lvl 5 10 mins UE/LE Machine rows 15 lbs 2 x 15 c core contraction focus  Supine bridge x 10 3 sec hold - blue band around knees hip abduction hold Supine clam shell blue band x 20 bilateral Figure 4 push away and pull towards 15 sec x 3 each, performed bilaterally Supine lumbar trunk rotation 15 sec x 3 bilateral Supine SLR c focus on core stability hold 2 x 10  bilateral slowly Standing lumbar extension 2 x 5  AROM Tband gh ext green 2 x 10 c 2-3 sec hold     03/27/2022: Therex: Nustep Lvl 5 10 mins UE/LE Single knee to chest 15 sec hold x 3 with contralateral leg outstretched Figure 4 push away and pull towards 15 sec x 3 each, performed bilaterally Supine lumbar trunk rotation 15 sec x 3 bilateral Supine bridge x 15 3 sec hold  Supine SLR c focus on core stability hold x 15 bilateral slowly Standing lumbar extension x 5 AROM  Sit to stand to sit transfer s UE assist with slow lowering from 22 inch table height (limited to go lower due to pain  complaints)    03/21/2022: Therex: Standing lumbar extension x 3 Lumbar trunk rotation stretch 15 sec x 3 bilateral Single knee to chest 15 sec hold x 3 with contralateral leg outstretched Supine bridge 3 sec hold x 15 Quadruped arm/leg reach opposite 3 sec hold x 10 bilateral Seated pball press down 5 sec hold x 10  Seated multifidi table lift 5 sec hold x 10 Nustep lvl 5 10 mins UE/LE - SPM around 60      PATIENT EDUCATION:  03/08/2022 Education details: HEP, POC Person educated: Patient Education method: Consulting civil engineer, Media planner, Verbal cues, and Handouts Education comprehension: verbalized understanding, returned demonstration, and verbal cues required     HOME EXERCISE PROGRAM: Access Code: [email or web address] URL: [email or web address] Date: 03/21/2022 Prepared by: Scot Jun  Exercises - Supine Lower Trunk Rotation  - 1-2 x daily - 7 x weekly - 1 sets - 3-5 reps - 15 hold - Standing Lumbar Extension with Counter  - 3-5 x daily - 7 x weekly - 1 sets - 5-10 reps - Supine Single Knee to Chest Stretch (Mirrored)  - 1-2 x daily - 7 x weekly - 1 sets - 5 reps - 15 hold - Supine 90/90 Sciatic Nerve Glide with Knee Flexion/Extension (Mirrored)  - 1-2 x daily - 7 x weekly - 1-2 sets - 10 reps - Seated Multifidi Isometric  - 2-3 x daily - 7 x weekly - 1 sets - 10 reps - 5-10 hold - Supine Bridge  - 1-2 x daily - 7 x weekly - 1-2 sets - 10 reps - 2 hold   ASSESSMENT:   CLINICAL IMPRESSION: Pt has attended 5 visits overall during course of treatment.  Pt rated global rating of change +6 at great deal better at this time.  See objective data for mild improvements in mobility.  Pt has made improvements but still has limits in functional activity that may benefit from continued skilled PT services.     OBJECTIVE IMPAIRMENTS decreased activity tolerance, decreased coordination, decreased endurance, decreased mobility, difficulty walking, decreased ROM, decreased strength,  hypomobility, increased fascial restrictions, impaired perceived functional ability, increased muscle spasms, impaired flexibility, improper body mechanics, and pain.    ACTIVITY LIMITATIONS carrying, lifting, bending, sitting, standing, squatting, transfers, bed mobility, and locomotion level   PARTICIPATION LIMITATIONS: cleaning, laundry, interpersonal relationship, shopping, community activity, occupation, and yard work   PERSONAL FACTORS Time since onset of injury/illness/exacerbation and HTN, migraines, chronic back pain history, sickle cell trait  are also affecting patient's functional outcome.    REHAB POTENTIAL: Fair to good   CLINICAL DECISION MAKING: Stable/uncomplicated   EVALUATION COMPLEXITY: Low     GOALS: Goals reviewed with patient? Yes   Short term PT Goals (target date for Short term goals are 3 weeks 03/29/2022) Patient will demonstrate independent use of home exercise program to maintain progress from in clinic treatments. Goal status: Met 04/01/2022   Long term PT goals (target dates for all long term goals are 10 weeks  05/17/2022 )   1. Patient will demonstrate/report pain at worst less than or equal to 2/10 to facilitate minimal limitation in daily activity secondary to pain symptoms. Goal status: on going - assessed 04/01/2022   2. Patient will demonstrate independent use of home exercise program to facilitate ability to maintain/progress functional gains from skilled physical therapy services. Goal status: on going - assessed 04/01/2022   3. Patient will demonstrate FOTO outcome > or = 56 % to indicate reduced disability due to condition. Goal status: on going - assessed 04/01/2022   4. Patient will demonstrate lumbar extension 100 % WFL s symptoms to facilitate upright standing, walking posture at PLOF s limitation. Goal status: partially met - assessed 04/01/2022   5.  Patient will demonstrate lumbar flexion MMT in supine > or = 4/5 to facilitate improved  stability in functional lifting/carrying tasks.    Goal status: on going - assessed 04/01/2022   6.  Patient will demonstrate/report ability to lift/carry 25 lbs for work related tasks.   Goal status: on going - assessed 04/01/2022       PLAN: PT FREQUENCY: 1-2x/week   PT DURATION: 10 weeks   PLANNED INTERVENTIONS: Therapeutic exercises, Therapeutic activity, Neuro Muscular re-education, Balance training, Gait training, Patient/Family education, Joint mobilization, Stair training,  DME instructions, Dry Needling, Electrical stimulation, Cryotherapy, Moist heat, Taping, Traction Ultrasound, Ionotophoresis 41m/ml Dexamethasone, and Manual therapy.  All included unless contraindicated     PLAN FOR NEXT SESSION:  Recheck after MD appointment report.     MScot Jun PT, DPT, OCS, ATC 04/01/22  12:18 PM   PHYSICAL THERAPY DISCHARGE SUMMARY  Visits from Start of Care: 5  Current functional level related to goals / functional outcomes: See note   Remaining deficits: See note   Education / Equipment: HEP  Patient goals were partially met. Patient is being discharged due to not returning since the last visit.  MScot Jun PT, DPT, OCS, ATC 05/02/22  8:59 AM

## 2022-04-03 ENCOUNTER — Encounter: Payer: BC Managed Care – PPO | Admitting: Rehabilitative and Restorative Service Providers"

## 2022-04-03 ENCOUNTER — Other Ambulatory Visit (HOSPITAL_COMMUNITY): Payer: Self-pay

## 2022-04-03 ENCOUNTER — Other Ambulatory Visit: Payer: Self-pay | Admitting: Specialist

## 2022-04-03 ENCOUNTER — Ambulatory Visit: Payer: BC Managed Care – PPO | Admitting: Orthopedic Surgery

## 2022-04-03 ENCOUNTER — Encounter: Payer: Self-pay | Admitting: Orthopedic Surgery

## 2022-04-03 ENCOUNTER — Encounter: Payer: BC Managed Care – PPO | Admitting: Orthopedic Surgery

## 2022-04-03 VITALS — BP 108/75 | HR 81 | Ht 65.5 in | Wt 187.0 lb

## 2022-04-03 DIAGNOSIS — Z9889 Other specified postprocedural states: Secondary | ICD-10-CM | POA: Diagnosis not present

## 2022-04-03 NOTE — Progress Notes (Signed)
Orthopedic Spine Surgery Office Note  Assessment: Patient is a 36 y.o. female who is 5 months s/p L5/S1 microdiscectomy with Dr. Louanne Skye.  Her leg pain has resolved since surgery but still having low back pain   Plan: -I told her that she should stop using the tramadol and use her other medications (gabapentin, muscle relaxer, NSAIDs) to control her pain -I encouraged her to keep doing the core strengthening exercises to help with the back pain -I explained that microdiscectomy surgery is done to help with the leg pain but does not reliably relieve the back pain -I will prescribe her the gabapentin, muscle relaxer, NSAIDs as needed but informed her that if she wants to continue with the narcotic pain medication, she would to establish with pain management. I am happy to provider her a referral for that if she needs it  -I provided her with a work note stating that she can go back to work without restrictions at this time -Patient should return to office in 6 months, repeat x-rays of lumbar spine at next visit: Lumbar AP/lateral/flex/ex   Patient expressed understanding of the plan and all questions were answered to the patient's satisfaction.   ___________________________________________________________________________   History:  Patient is a 36 y.o. female who presents today for follow-up of a L5/S1 microdiscectomy.  Patient's leg pain has completely resolved at this point.  She is still having some low back pain.  She states its worse in the mornings and evenings.  It also also worse if it is cold out.  The medication she is currently taking are helping with the pain.  Has not noticed any redness or drainage from her surgical incision.   Weakness: Denies Symptoms of imbalance: Denies Paresthesias and numbness: Denies Bowel or bladder incontinence: Denies Saddle anesthesia: Denies  Treatments tried: Tramadol, Flexeril, Celebrex, gabapentin, physical therapy  Review of systems: Denies  fevers and chills, night sweats, unexplained weight loss, history of cancer, pain that wakes them at night  Past medical history: Migraines Anxiety  Allergies: NKDA  Past surgical history:  L5/S1 microdiscectomy C-section x2 Right knee surgery  Social history: Denies use of nicotine product (smoking, vaping, patches, smokeless) Alcohol use: Yes, 1/week Denies recreational drug use   Physical Exam:  General: no acute distress, appears stated age Neurologic: alert, answering questions appropriately, following commands Respiratory: unlabored breathing on room air, symmetric chest rise Psychiatric: appropriate affect, normal cadence to speech   MSK (spine):  -Strength exam      Left  Right EHL    5/5  5/5 TA    5/5  5/5 GSC    5/5  5/5 Knee extension  5/5  5/5 Hip flexion   5/5  5/5  -Sensory exam    Sensation intact to light touch in L3-S1 nerve distributions of bilateral lower extremities  -Incision over her lower lumbar spine appears well-healed with no evidence of infection  -Straight leg raise: Negative -Contralateral straight leg raise: Negative -Clonus: no beats bilaterally  -Left hip exam: No pain through range of motion, negative [place] -Right hip exam: No pain to range of motion, negative [place]  Imaging: No new imaging was obtained at today's visit  Patient name: Penny Thomas Patient MRN: [id number] Date of visit: 04/03/22

## 2022-04-04 ENCOUNTER — Other Ambulatory Visit (HOSPITAL_COMMUNITY): Payer: Self-pay

## 2022-04-04 ENCOUNTER — Encounter: Payer: BC Managed Care – PPO | Admitting: Orthopedic Surgery

## 2022-04-09 ENCOUNTER — Encounter: Payer: Self-pay | Admitting: Internal Medicine

## 2022-04-10 ENCOUNTER — Other Ambulatory Visit (HOSPITAL_COMMUNITY): Payer: Self-pay

## 2022-04-13 ENCOUNTER — Other Ambulatory Visit: Payer: Self-pay

## 2022-04-15 ENCOUNTER — Other Ambulatory Visit: Payer: Self-pay | Admitting: Specialist

## 2022-04-15 ENCOUNTER — Other Ambulatory Visit (HOSPITAL_COMMUNITY): Payer: Self-pay

## 2022-04-15 MED ORDER — CYCLOBENZAPRINE HCL 10 MG PO TABS
10.0000 mg | ORAL_TABLET | Freq: Three times a day (TID) | ORAL | 0 refills | Status: DC | PRN
  Filled 2022-04-15: qty 30, 10d supply, fill #0

## 2022-04-15 MED ORDER — GABAPENTIN 100 MG PO CAPS
200.0000 mg | ORAL_CAPSULE | Freq: Two times a day (BID) | ORAL | 1 refills | Status: DC
  Filled 2022-04-15: qty 40, 10d supply, fill #0
  Filled 2022-04-28: qty 40, 10d supply, fill #1

## 2022-04-28 ENCOUNTER — Other Ambulatory Visit: Payer: Self-pay | Admitting: Orthopedic Surgery

## 2022-04-29 ENCOUNTER — Other Ambulatory Visit (HOSPITAL_COMMUNITY): Payer: Self-pay

## 2022-04-29 MED ORDER — CYCLOBENZAPRINE HCL 10 MG PO TABS
10.0000 mg | ORAL_TABLET | Freq: Three times a day (TID) | ORAL | 0 refills | Status: DC | PRN
  Filled 2022-04-29: qty 30, 10d supply, fill #0

## 2022-05-13 ENCOUNTER — Other Ambulatory Visit: Payer: Self-pay | Admitting: Radiology

## 2022-05-13 ENCOUNTER — Other Ambulatory Visit: Payer: Self-pay | Admitting: Orthopedic Surgery

## 2022-05-14 ENCOUNTER — Encounter: Payer: Self-pay | Admitting: Nurse Practitioner

## 2022-05-14 ENCOUNTER — Other Ambulatory Visit (HOSPITAL_COMMUNITY): Payer: Self-pay

## 2022-05-14 ENCOUNTER — Other Ambulatory Visit: Payer: Self-pay

## 2022-05-14 MED ORDER — CYCLOBENZAPRINE HCL 10 MG PO TABS
10.0000 mg | ORAL_TABLET | Freq: Three times a day (TID) | ORAL | 0 refills | Status: DC | PRN
  Filled 2022-05-14: qty 30, 10d supply, fill #0

## 2022-05-14 MED ORDER — GABAPENTIN 100 MG PO CAPS
200.0000 mg | ORAL_CAPSULE | Freq: Two times a day (BID) | ORAL | 1 refills | Status: DC
  Filled 2022-05-14: qty 40, 10d supply, fill #0
  Filled 2022-06-07: qty 40, 10d supply, fill #1

## 2022-05-15 ENCOUNTER — Other Ambulatory Visit: Payer: Self-pay | Admitting: Nurse Practitioner

## 2022-05-15 ENCOUNTER — Other Ambulatory Visit (HOSPITAL_COMMUNITY): Payer: Self-pay

## 2022-05-15 DIAGNOSIS — B3731 Acute candidiasis of vulva and vagina: Secondary | ICD-10-CM

## 2022-05-15 MED ORDER — FLUCONAZOLE 150 MG PO TABS
150.0000 mg | ORAL_TABLET | ORAL | 0 refills | Status: DC
  Filled 2022-05-15: qty 2, 6d supply, fill #0

## 2022-05-21 ENCOUNTER — Other Ambulatory Visit (HOSPITAL_BASED_OUTPATIENT_CLINIC_OR_DEPARTMENT_OTHER): Payer: Self-pay

## 2022-05-21 NOTE — Progress Notes (Deleted)
NEUROLOGY FOLLOW UP OFFICE NOTE  Penny Thomas [id number]  Assessment/Plan:   1  Migraine with and without aura, without status migrainosus, not intractable 2  Primary stabbing headache   MRI and MRA of brain For stabbing headache, consider starting melatonin 3 to '10mg'$  at bedtime *** Migraine prevention:  propranolol ER '120mg'$  daily *** Migraine rescue:  first line - naproxen; second line - sumatriptan [place] or Reyvow Limit use of pain relievers to no more than 2 days out of week to prevent risk of rebound or medication-overuse headache. Keep headache diary Follow up one year or as needed   Subjective:  Penny Thomas is a 36 year old right-handed woman who follows up for migraines.   UPDATE: Migraines.  Increased propranolol to '120mg'$ .  ***  She started getting a new headache in November 2022.  It is a severe stabbing headache in the left temple.  Left eye gets watery but no conjunctival injection or ptosis.  No visual disturbance, nausea, photophobia or phonophobia.  No other symptoms.  Lasts 5 to 10 minutes.  She has had about 3 episodes.  MRI and MRA of brain on 06/15/2021 personally reviewed were unremarkable.     Rescue protocol:  First line, naproxen '500mg'$  (at work), will take sumatriptan [place] or Reyvow at home. Current NSAID:  Naproxen '500mg'$  Current analgesics:  tramadol '50mg'$  Current triptans:  Sumatriptan [place] (have not used them) Current ergotamine:  none Other abortive therapy:  Reyvow Current anti-emetic:  none Current muscle relaxants: none Current anti-anxiolytic:  none Current sleep aide:  none Current Antihypertensive medications:  Propranolol ER '80mg'$  Current Antidepressant medications:  sertraline '50mg'$  Current Anticonvulsant medications:  none Current anti-CGRP:  none Current Vitamins/Herbal/Supplements:  none Current Antihistamines/Decongestants:  none Other therapy:  Reyvow Hormone/birth control:  [name] Other medication:  none   Caffeine:   no Alcohol:  seldom Smoker:  no Diet:  hydrates Exercise:  no Depression:  no; Anxiety:  no Other pain:  no Sleep hygiene:  okay   HISTORY: Onset:  Age 82. Location:  Varies (bi-frontal, back of head) Quality:  Squeezing/"feels like I am hit in the head with a hammer" Initial Intensity:  9-10/10 Aura:  blurred vision Prodrome:  no Associated symptoms: Nausea, dizziness, photophobia, osmophobia Initial Duration:  1 to 3 days initiail Frequency:  3 to 4 days per week (16 headache days per week) Triggers:  Menstrual cycle, caffeine, scents (perfume, deoderant, hand sanitizer), change in weather Relieving factors: Laying down Activity:  Lays down if severe   Past NSAIDS:  Mobic, ibuprofen '800mg'$ , [place] Past analgesics:  Hydrocodone-acetaminophen, tramadol '50mg'$ , Roxicet, Excedrin Migraine Past abortive triptans:  sumatriptan '100mg'$ , Maxalt '10mg'$ , Relpax '20mg'$ , sumatriptan [place] Past ergot:  none Past muscle relaxants:  Flexeril, baclofen, tizanidine '2mg'$ , Robaxin Past anti-nausea:  Zofran '4mg'$ , promethazine '25mg'$  Past antihypertensive medications:  no Past antidepressant medications:  amitriptyline '50mg'$  (weight gain) Past anticonvulsant medications:  topiramate '150mg'$  (effective but caused hair loss) Past vitamins/Herbal/Supplements:  no Past antihistamines/decongestants:  no Other past medications:  no   Family history of headache:  Mom, sister, grandmother  PAST MEDICAL HISTORY: Past Medical History:  Diagnosis Date   Acute nasopharyngitis 12/11/2016   Acute non-recurrent frontal sinusitis 05/31/2020   Allergy    Anemia    takes iron supplement   Anxiety    Asthma    Asthma    prn inhaler   Blood transfusion 2007   Blood transfusion 2010   Blood transfusion without reported diagnosis    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   Cough 05/31/2020   COVID-19 05/24/2020   H/O: hysterectomy 10/2019   Hemorrhage in uterus 01/20/2009   Hypertension    Migraines    Muscle cramps  89/21/1941   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 6.1 g 5   Bacillus Coagulans-Inulin (PROBIOTIC-PREBIOTIC PO) Take 1 capsule by mouth daily.     beclomethasone (QVAR) 80 MCG/ACT inhaler Inhale 1 puff into the lungs 2 (two) times daily.     BIOTIN PO Take 1 tablet by mouth daily.     celecoxib (CELEBREX) 200 MG capsule Take 1 capsule (200 mg total) by mouth 2 (two) times daily after a meal. 60 capsule 3   cetirizine (ZYRTEC) 10 MG tablet Take 1 tablet by mouth daily. 30 tablet 5   cyclobenzaprine (FLEXERIL) 10 MG tablet Take 1 tablet (10 mg total) by mouth 3 (three) times daily as needed for muscle spasms. 30 tablet 0   docusate sodium (COLACE) 100 MG capsule Take 1 capsule (100 mg total) by mouth 2 (two) times daily. 10 capsule 0   famotidine (PEPCID) 20 MG tablet Take 1 tablet by mouth 1 - 2 times per day for itching 60 tablet 5   fluconazole (DIFLUCAN) 150 MG tablet Take 1 tablet (150 mg total) by mouth every 3 (three) days. 2 tablet 0   fluticasone (FLONASE) 50 MCG/ACT nasal spray Place 2 sprays in each nostril daily as directed (Patient taking differently: Place 2 sprays into both nostrils daily as needed for allergies.) 16 g 0   gabapentin (NEURONTIN) 100 MG capsule Take 2 capsules (200 mg total) by mouth 2 (two) times daily. 40 capsule 1   metroNIDAZOLE (METROGEL) 0.75 % vaginal gel Place 1 Applicatorful vaginally at bedtime. For 5 nights 70 g 0   Multiple Vitamin (MULTIVITAMIN WITH MINERALS) TABS tablet Take  1 tablet by mouth daily.     naproxen (NAPROSYN) 500 MG tablet TAKE 1 TABLET BY MOUTH EVERY 12 HOURS AS NEEDED. 60 tablet 1   propranolol ER (INDERAL LA) 120 MG 24 hr capsule Take 1 capsule (120 mg total) by mouth at bedtime. 30 capsule 5   simethicone (MYLICON) 740 MG chewable tablet Chew 125 mg by mouth in the morning and at bedtime.     SUMAtriptan 6 MG/0.5ML SOAJ INJECT 6 MG INTO THE SKIN AS NEEDED (MAY REPEAT IN ONE HOUR. MAXIMUM 2 INJECTIONS IN 24 HOURS.). 3 mL 5   traMADol (ULTRAM) 50 MG tablet Take 2 tablets by mouth every 6 hours as needed. 40 tablet 0   [DISCONTINUED] esomeprazole (NEXIUM) 40 MG capsule TAKE 1 CAPSULE BY MOUTH ONCE A DAY AT 12 NOON 30 capsule 1   No current facility-administered medications on file prior to visit.    ALLERGIES: Allergies  Allergen Reactions   [name] [Elagolix] Other (See Comments)    Hallucinations    FAMILY HISTORY: Family History  Problem Relation Age of Onset   Fibromyalgia  Mother    Multiple sclerosis Mother    Asthma Father    Healthy Maternal Grandmother    Healthy Maternal Grandfather    Healthy Paternal Grandmother    Healthy Paternal Grandfather    Endometriosis Sister    Healthy Brother    Healthy Child       Objective:  *** General: No acute distress.  Patient appears well-groomed.   Head:  Normocephalic/atraumatic Eyes:  Fundi examined but not visualized Neck: supple, no paraspinal tenderness, full range of motion Heart:  Regular rate and rhythm Neurological Exam: alert and oriented to person, place, and time.  Speech fluent and not dysarthric, language intact.  CN II-XII intact. Bulk and tone normal, muscle strength 5/5 throughout.  Sensation to light touch intact.  Deep tendon reflexes 2+ throughout.  Finger to nose testing intact.  Gait normal, Romberg negative.   Metta Clines, DO  CC: Harland Dingwall, PA-C

## 2022-05-22 ENCOUNTER — Ambulatory Visit: Payer: Managed Care, Other (non HMO) | Admitting: Neurology

## 2022-06-03 NOTE — Progress Notes (Deleted)
NEUROLOGY FOLLOW UP OFFICE NOTE  KEAIRRA BARDON [id number]  Assessment/Plan:   1  Migraine with and without aura, without status migrainosus, not intractable 2  Primary stabbing headache   MRI and MRA of brain For stabbing headache, consider starting melatonin 3 to '10mg'$  at bedtime *** Migraine prevention:  propranolol ER '120mg'$  daily *** Migraine rescue:  first line - naproxen; second line - sumatriptan [place] or [place] Limit use of pain relievers to no more than 2 days out of week to prevent risk of rebound or medication-overuse headache. Keep headache diary Follow up one year or as needed   Subjective:  Desirey Keahey is a 37 year old right-handed woman who follows up for migraines.   UPDATE: Migraines.  Increased propranolol to '120mg'$ .  ***  She started getting a new headache in November 2022.  It is a severe stabbing headache in the left temple.  Left eye gets watery but no conjunctival injection or ptosis.  No visual disturbance, nausea, photophobia or phonophobia.  No other symptoms.  Lasts 5 to 10 minutes.  She has had about 3 episodes.  MRI and MRA of brain on 06/15/2021 personally reviewed were unremarkable.     Rescue protocol:  First line, naproxen '500mg'$  (at work), will take sumatriptan [place] or [place] at home. Current NSAID:  Naproxen '500mg'$  Current analgesics:  tramadol '50mg'$  Current triptans:  Sumatriptan [place] (have not used them) Current ergotamine:  none Other abortive therapy:  Reyvow Current anti-emetic:  none Current muscle relaxants: none Current anti-anxiolytic:  none Current sleep aide:  none Current Antihypertensive medications:  Propranolol ER '80mg'$  Current Antidepressant medications:  sertraline '50mg'$  Current Anticonvulsant medications:  none Current anti-CGRP:  none Current Vitamins/Herbal/Supplements:  none Current Antihistamines/Decongestants:  none Other therapy:  Reyvow Hormone/birth control:  [name] Other medication:  none   Caffeine:   no Alcohol:  seldom Smoker:  no Diet:  hydrates Exercise:  no Depression:  no; Anxiety:  no Other pain:  no Sleep hygiene:  okay   HISTORY: Onset:  Age 35. Location:  Varies (bi-frontal, back of head) Quality:  Squeezing/"feels like I am hit in the head with a hammer" Initial Intensity:  9-10/10 Aura:  blurred vision Prodrome:  no Associated symptoms: Nausea, dizziness, photophobia, osmophobia Initial Duration:  1 to 3 days initiail Frequency:  3 to 4 days per week (16 headache days per week) Triggers:  Menstrual cycle, caffeine, scents (perfume, deoderant, hand sanitizer), change in weather Relieving factors: Laying down Activity:  Lays down if severe   Past NSAIDS:  Mobic, ibuprofen '800mg'$ , Cambia Past analgesics:  Hydrocodone-acetaminophen, tramadol '50mg'$ , Roxicet, Excedrin Migraine Past abortive triptans:  sumatriptan '100mg'$ , Maxalt '10mg'$ , Relpax '20mg'$ , sumatriptan [place] Past ergot:  none Past muscle relaxants:  Flexeril, baclofen, tizanidine '2mg'$ , Robaxin Past anti-nausea:  Zofran '4mg'$ , promethazine '25mg'$  Past antihypertensive medications:  no Past antidepressant medications:  amitriptyline '50mg'$  (weight gain) Past anticonvulsant medications:  topiramate '150mg'$  (effective but caused hair loss) Past vitamins/Herbal/Supplements:  no Past antihistamines/decongestants:  no Other past medications:  no   Family history of headache:  Mom, sister, grandmother  PAST MEDICAL HISTORY: Past Medical History:  Diagnosis Date   Acute nasopharyngitis 12/11/2016   Acute non-recurrent frontal sinusitis 05/31/2020   Allergy    Anemia    takes iron supplement   Anxiety    Asthma    Asthma    prn inhaler   Blood transfusion 2007   Blood transfusion 2010   Blood transfusion without reported diagnosis    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   Cough 05/31/2020   COVID-19 05/24/2020   H/O: hysterectomy 10/2019   Hemorrhage in uterus 01/20/2009   Hypertension    Migraines    Muscle cramps  09/62/8366   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 6.1 g 5   Bacillus Coagulans-Inulin (PROBIOTIC-PREBIOTIC PO) Take 1 capsule by mouth daily.     beclomethasone (QVAR) 80 MCG/ACT inhaler Inhale 1 puff into the lungs 2 (two) times daily.     BIOTIN PO Take 1 tablet by mouth daily.     celecoxib (CELEBREX) 200 MG capsule Take 1 capsule (200 mg total) by mouth 2 (two) times daily after a meal. 60 capsule 3   cetirizine (ZYRTEC) 10 MG tablet Take 1 tablet by mouth daily. 30 tablet 5   cyclobenzaprine (FLEXERIL) 10 MG tablet Take 1 tablet (10 mg total) by mouth 3 (three) times daily as needed for muscle spasms. 30 tablet 0   docusate sodium (COLACE) 100 MG capsule Take 1 capsule (100 mg total) by mouth 2 (two) times daily. 10 capsule 0   famotidine (PEPCID) 20 MG tablet Take 1 tablet by mouth 1 - 2 times per day for itching 60 tablet 5   fluconazole (DIFLUCAN) 150 MG tablet Take 1 tablet (150 mg total) by mouth every 3 (three) days. 2 tablet 0   fluticasone (FLONASE) 50 MCG/ACT nasal spray Place 2 sprays in each nostril daily as directed (Patient taking differently: Place 2 sprays into both nostrils daily as needed for allergies.) 16 g 0   gabapentin (NEURONTIN) 100 MG capsule Take 2 capsules (200 mg total) by mouth 2 (two) times daily. 40 capsule 1   metroNIDAZOLE (METROGEL) 0.75 % vaginal gel Place 1 Applicatorful vaginally at bedtime. For 5 nights 70 g 0   Multiple Vitamin (MULTIVITAMIN WITH MINERALS) TABS tablet Take  1 tablet by mouth daily.     naproxen (NAPROSYN) 500 MG tablet TAKE 1 TABLET BY MOUTH EVERY 12 HOURS AS NEEDED. 60 tablet 1   propranolol ER (INDERAL LA) 120 MG 24 hr capsule Take 1 capsule (120 mg total) by mouth at bedtime. 30 capsule 5   simethicone (MYLICON) 294 MG chewable tablet Chew 125 mg by mouth in the morning and at bedtime.     SUMAtriptan 6 MG/0.5ML SOAJ INJECT 6 MG INTO THE SKIN AS NEEDED (MAY REPEAT IN ONE HOUR. MAXIMUM 2 INJECTIONS IN 24 HOURS.). 3 mL 5   traMADol (ULTRAM) 50 MG tablet Take 2 tablets by mouth every 6 hours as needed. 40 tablet 0   [DISCONTINUED] esomeprazole (NEXIUM) 40 MG capsule TAKE 1 CAPSULE BY MOUTH ONCE A DAY AT 12 NOON 30 capsule 1   No current facility-administered medications on file prior to visit.    ALLERGIES: Allergies  Allergen Reactions   [name] [Elagolix] Other (See Comments)    Hallucinations    FAMILY HISTORY: Family History  Problem Relation Age of Onset   Fibromyalgia  Mother    Multiple sclerosis Mother    Asthma Father    Healthy Maternal Grandmother    Healthy Maternal Grandfather    Healthy Paternal Grandmother    Healthy Paternal Grandfather    Endometriosis Sister    Healthy Brother    Healthy Child       Objective:  *** General: No acute distress.  Patient appears well-groomed.   Head:  Normocephalic/atraumatic Eyes:  Fundi examined but not visualized Neck: supple, no paraspinal tenderness, full range of motion Heart:  Regular rate and rhythm Neurological Exam: alert and oriented to person, place, and time.  Speech fluent and not dysarthric, language intact.  CN II-XII intact. Bulk and tone normal, muscle strength 5/5 throughout.  Sensation to light touch intact.  Deep tendon reflexes 2+ throughout.  Finger to nose testing intact.  Gait normal, Romberg negative.   Metta Clines, DO  CC: Harland Dingwall, PA-C

## 2022-06-04 ENCOUNTER — Ambulatory Visit: Payer: BC Managed Care – PPO | Admitting: Neurology

## 2022-06-05 NOTE — Progress Notes (Signed)
NEUROLOGY FOLLOW UP OFFICE NOTE  Penny Thomas [id number]  Assessment/Plan:   1  Migraine with and without aura, without status migrainosus, not intractable 2  Primary stabbing headache   Migraine prevention:  propranolol ER '120mg'$  daily  Migraine rescue:  first line - naproxen; second line - sumatriptan [name] Limit use of pain relievers to no more than 2 days out of week to prevent risk of rebound or medication-overuse headache. Keep headache diary Follow up one year or as needed   Subjective:  Penny Thomas is a 37 year old right-handed woman who follows up for migraines.   UPDATE: Migraines.  Increased propranolol to '120mg'$ .  She started getting a new headache in November 2022.  It is a severe stabbing headache in the left temple.  Left eye gets watery but no conjunctival injection or ptosis.  No visual disturbance, nausea, photophobia or phonophobia.  No other symptoms.  Lasts 5 to 10 minutes.  She has had about 3 episodes.  MRI and MRA of brain on 06/15/2021 personally reviewed were unremarkable.  Headaches improved with increase in propranolol.  Typical migraines occur 1 time a week or sometimes headache-free weeks.  On occasion, twice a week.  Usually lasts 45 minutes with naproxen (hasn't had to use the sumatriptan)  The stabbing headache is mild and infrequent, about once a month.     Rescue protocol:  First line, naproxen '500mg'$  (at work), will take sumatriptan [name] Current NSAID:  Naproxen '500mg'$  Current analgesics:  tramadol '50mg'$  Current triptans:  Sumatriptan [name] (have not used them) Current ergotamine:  none Other abortive therapy:  Reyvow Current anti-emetic:  none Current muscle relaxants: none Current anti-anxiolytic:  none Current sleep aide:  none Current Antihypertensive medications:  Propranolol ER '80mg'$  Current Antidepressant medications:  sertraline '50mg'$  Current Anticonvulsant medications:  none Current anti-CGRP:  none Current  Vitamins/Herbal/Supplements:  none Current Antihistamines/Decongestants:  none Other therapy:  Reyvow Hormone/birth control:  [name] Other medication:  none   Caffeine:  no Alcohol:  seldom Smoker:  no Diet:  hydrates Exercise:  no Depression:  no; Anxiety:  no Other pain:  no Sleep hygiene:  okay   HISTORY: Onset:  Age 59. Location:  Varies (bi-frontal, back of head) Quality:  Squeezing/"feels like I am hit in the head with a hammer" Initial Intensity:  9-10/10 Aura:  blurred vision Prodrome:  no Associated symptoms: Nausea, dizziness, photophobia, osmophobia Initial Duration:  1 to 3 days initiail Frequency:  3 to 4 days per week (16 headache days per week) Triggers:  Menstrual cycle, caffeine, scents (perfume, deoderant, hand sanitizer), change in weather Relieving factors: Laying down Activity:  Lays down if severe   Past NSAIDS:  Mobic, ibuprofen '800mg'$ , Cambia Past analgesics:  Hydrocodone-acetaminophen, tramadol '50mg'$ , Roxicet, Excedrin Migraine Past abortive triptans:  sumatriptan '100mg'$ , Maxalt '10mg'$ , Relpax '20mg'$ , sumatriptan [name] Past ergot:  none Past muscle relaxants:  Flexeril, baclofen, tizanidine '2mg'$ , Robaxin Past anti-nausea:  Zofran '4mg'$ , promethazine '25mg'$  Past antihypertensive medications:  no Past antidepressant medications:  amitriptyline '50mg'$  (weight gain) Past anticonvulsant medications:  topiramate '150mg'$  (effective but caused hair loss) Past vitamins/Herbal/Supplements:  no Past antihistamines/decongestants:  no Other past medications:  Reyvow ineffective   Family history of headache:  Mom, sister, grandmother  PAST MEDICAL HISTORY: Past Medical History:  Diagnosis Date   Acute nasopharyngitis 12/11/2016   Acute non-recurrent frontal sinusitis 05/31/2020   Allergy    Anemia    takes iron supplement   Anxiety    Asthma    Asthma  prn inhaler   Blood transfusion 2007   Blood transfusion 2010   Blood transfusion without reported diagnosis     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   Cough 05/31/2020   COVID-19 05/24/2020   H/O: hysterectomy 10/2019   Hemorrhage in uterus 01/20/2009   Hypertension    Migraines    Muscle cramps 17/51/0258   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 6.1 g 5   Bacillus Coagulans-Inulin (PROBIOTIC-PREBIOTIC PO) Take 1 capsule by mouth daily.     beclomethasone (QVAR) 80 MCG/ACT inhaler Inhale 1 puff into the lungs 2 (two) times daily.     BIOTIN PO Take 1 tablet by mouth daily.     celecoxib (CELEBREX) 200 MG capsule Take 1 capsule (200 mg total) by mouth 2 (two) times daily after a meal. 60 capsule 3   cetirizine (ZYRTEC) 10 MG tablet Take 1 tablet by mouth daily. 30 tablet 5   cyclobenzaprine (FLEXERIL) 10 MG tablet Take 1 tablet (10 mg total) by mouth 3 (three) times daily as needed for muscle spasms. 30 tablet 0   docusate sodium (COLACE) 100 MG capsule Take 1 capsule (100 mg total) by mouth 2 (two) times daily. 10 capsule 0   famotidine (PEPCID) 20 MG tablet Take 1 tablet by mouth 1 - 2 times per day for itching 60 tablet 5   fluconazole (DIFLUCAN) 150 MG tablet Take 1 tablet (150 mg total) by mouth every 3 (three) days. 2 tablet 0   fluticasone (FLONASE) 50 MCG/ACT nasal spray Place 2 sprays in each nostril daily as directed (Patient taking differently: Place 2 sprays into both nostrils daily as needed for allergies.) 16 g 0   gabapentin (NEURONTIN) 100 MG capsule Take 2 capsules (200 mg total) by mouth 2 (two) times daily.  40 capsule 1   metroNIDAZOLE (METROGEL) 0.75 % vaginal gel Place 1 Applicatorful vaginally at bedtime. For 5 nights 70 g 0   Multiple Vitamin (MULTIVITAMIN WITH MINERALS) TABS tablet Take 1 tablet by mouth daily.     naproxen (NAPROSYN) 500 MG tablet TAKE 1 TABLET BY MOUTH EVERY 12 HOURS AS NEEDED. 60 tablet 1   propranolol ER (INDERAL LA) 120 MG 24 hr capsule Take 1 capsule (120 mg total) by mouth at bedtime. 30 capsule 5   simethicone (MYLICON) 527 MG chewable tablet Chew 125 mg by mouth in the morning and at bedtime.     SUMAtriptan 6 MG/0.5ML SOAJ INJECT 6 MG INTO THE SKIN AS NEEDED (MAY REPEAT IN ONE HOUR. MAXIMUM 2 INJECTIONS IN 24 HOURS.). 3 mL 5   traMADol (ULTRAM) 50 MG tablet Take 2 tablets by mouth every 6 hours as needed. 40 tablet 0   [DISCONTINUED] esomeprazole (NEXIUM) 40 MG capsule TAKE 1 CAPSULE BY MOUTH ONCE A DAY AT 12 NOON 30 capsule 1   No current facility-administered medications on file prior to visit.    ALLERGIES: Allergies  Allergen Reactions   [name] [Elagolix]  Other (See Comments)    Hallucinations    FAMILY HISTORY: Family History  Problem Relation Age of Onset   Fibromyalgia Mother    Multiple sclerosis Mother    Asthma Father    Healthy Maternal Grandmother    Healthy Maternal Grandfather    Healthy Paternal Grandmother    Healthy Paternal Grandfather    Endometriosis Sister    Healthy Brother    Healthy Child       Objective:  Blood pressure 108/74, pulse 97, height '5\' 7"'$  (1.702 m), weight 196 lb 6.4 oz (89.1 kg), last menstrual period 11/29/2015, SpO2 100 %. General: No acute distress.  Patient appears well-groomed.   Head:  Normocephalic/atraumatic Eyes:  Fundi examined but not visualized Neck: supple, no paraspinal tenderness, full range of motion Heart:  Regular rate and rhythm Neurological Exam: alert and oriented to person, place, and time.  Speech fluent and not dysarthric, language intact.  CN II-XII intact. Bulk and tone normal,  muscle strength 5/5 throughout.  Sensation to light touch intact.  Deep tendon reflexes 2+ throughout.  Finger to nose testing intact.  Gait normal, Romberg negative.   Metta Clines, DO  CC: Francis Gaines, PA-C

## 2022-06-07 ENCOUNTER — Other Ambulatory Visit: Payer: Self-pay

## 2022-06-07 ENCOUNTER — Other Ambulatory Visit (HOSPITAL_COMMUNITY): Payer: Self-pay

## 2022-06-07 ENCOUNTER — Ambulatory Visit: Payer: BC Managed Care – PPO | Admitting: Neurology

## 2022-06-07 ENCOUNTER — Encounter: Payer: Self-pay | Admitting: Neurology

## 2022-06-07 VITALS — BP 108/74 | HR 97 | Ht 67.0 in | Wt 196.4 lb

## 2022-06-07 DIAGNOSIS — G43109 Migraine with aura, not intractable, without status migrainosus: Secondary | ICD-10-CM

## 2022-06-07 DIAGNOSIS — G43009 Migraine without aura, not intractable, without status migrainosus: Secondary | ICD-10-CM | POA: Diagnosis not present

## 2022-06-07 DIAGNOSIS — G4485 Primary stabbing headache: Secondary | ICD-10-CM | POA: Diagnosis not present

## 2022-06-07 MED ORDER — PROPRANOLOL HCL ER 120 MG PO CP24
120.0000 mg | ORAL_CAPSULE | Freq: Every day | ORAL | 3 refills | Status: DC
  Filled 2022-06-07: qty 90, 90d supply, fill #0
  Filled 2022-09-08: qty 90, 90d supply, fill #1
  Filled 2022-12-06: qty 90, 90d supply, fill #2
  Filled 2023-03-07: qty 90, 90d supply, fill #3

## 2022-06-07 NOTE — Patient Instructions (Signed)
Propranolol ER '120mg'$  daily Naproxen for headache attacks, sumatriptan shot as second line Limit use of pain relievers to no more than 2 days out of week to prevent risk of rebound or medication-overuse headache. Keep headache diary

## 2022-06-10 ENCOUNTER — Other Ambulatory Visit (HOSPITAL_COMMUNITY): Payer: Self-pay

## 2022-06-11 ENCOUNTER — Encounter: Payer: Self-pay | Admitting: Orthopedic Surgery

## 2022-06-11 ENCOUNTER — Telehealth: Payer: Self-pay | Admitting: Orthopedic Surgery

## 2022-06-11 ENCOUNTER — Other Ambulatory Visit (HOSPITAL_COMMUNITY): Payer: Self-pay

## 2022-06-11 DIAGNOSIS — M545 Low back pain, unspecified: Secondary | ICD-10-CM

## 2022-06-11 MED ORDER — TRAMADOL HCL 50 MG PO TABS
50.0000 mg | ORAL_TABLET | Freq: Four times a day (QID) | ORAL | 0 refills | Status: AC | PRN
  Filled 2022-06-11: qty 20, 5d supply, fill #0

## 2022-06-11 NOTE — Telephone Encounter (Signed)
I sent in tramadol for the patient today since Dr. Louanne Skye had been prescribing this to her before for her back pain. She is now over 6 months out from a microdiscectomy with him and last time I saw her, I went over the fact that if she needed narcotics going forward, I would have to refer her to pain management. This will be the last narcotic that I fill because she is outside of my normal practice pattern of six weeks of narcotics post-operatively for pain control. I referred her to pain management today to go over treatment modalities/options for her chronic low back pain.   Callie Fielding, MD Orthopedic Surgeon

## 2022-06-24 ENCOUNTER — Other Ambulatory Visit: Payer: Self-pay | Admitting: Orthopedic Surgery

## 2022-06-26 ENCOUNTER — Other Ambulatory Visit (HOSPITAL_COMMUNITY): Payer: Self-pay

## 2022-06-26 MED ORDER — GABAPENTIN 100 MG PO CAPS
200.0000 mg | ORAL_CAPSULE | Freq: Two times a day (BID) | ORAL | 1 refills | Status: DC
  Filled 2022-06-26: qty 40, 10d supply, fill #0
  Filled 2022-08-01 – 2022-08-16 (×2): qty 40, 10d supply, fill #1

## 2022-06-26 MED ORDER — CYCLOBENZAPRINE HCL 10 MG PO TABS
10.0000 mg | ORAL_TABLET | Freq: Three times a day (TID) | ORAL | 0 refills | Status: DC | PRN
  Filled 2022-06-26: qty 30, 10d supply, fill #0

## 2022-07-16 ENCOUNTER — Other Ambulatory Visit (HOSPITAL_COMMUNITY): Payer: Self-pay

## 2022-08-06 ENCOUNTER — Encounter: Payer: Self-pay | Admitting: Nurse Practitioner

## 2022-08-08 ENCOUNTER — Ambulatory Visit: Payer: BC Managed Care – PPO | Admitting: Nurse Practitioner

## 2022-08-08 VITALS — BP 122/68 | HR 79

## 2022-08-08 DIAGNOSIS — N941 Unspecified dyspareunia: Secondary | ICD-10-CM | POA: Diagnosis not present

## 2022-08-08 DIAGNOSIS — Z113 Encounter for screening for infections with a predominantly sexual mode of transmission: Secondary | ICD-10-CM | POA: Diagnosis not present

## 2022-08-08 NOTE — Progress Notes (Signed)
   Acute Office Visit  Subjective:    Patient ID: Penny Thomas, female    DOB: 10/14/1985, 37 y.o.   MRN: [id number]   HPI 37 y.o. G3P0004 presents today for STD screening. Recent unprotected intercourse. Complains of vaginal dryness with intercourse. Has been using water-based lubrication only.    Review of Systems  Constitutional: Negative.   Genitourinary:  Positive for dyspareunia. Negative for vaginal discharge.       Objective:    Physical Exam Constitutional:      Appearance: Normal appearance.  Genitourinary:    General: Normal vulva.     Vagina: Normal.     Cervix: Normal.     BP 122/68   Pulse 79   LMP 11/29/2015   SpO2 96%  Wt Readings from Last 3 Encounters:  06/07/22 196 lb 6.4 oz (89.1 kg)  04/03/22 187 lb (84.8 kg)  03/06/22 187 lb (84.8 kg)        Patient informed chaperone available to be present for breast and/or pelvic exam. Patient has requested no chaperone to be present. Patient has been advised what will be completed during breast and pelvic exam.   Assessment & Plan:   Problem List Items Addressed This Visit   None Visit Diagnoses     Screening examination for STD (sexually transmitted disease)    -  Primary   Relevant Orders   SureSwab Advanced Vaginitis Plus,TMA   RPR   HIV Antibody (routine testing w rflx)   Female dyspareunia          Plan: Vaginitis panel pending, STD panel pending. Recommend coconut oil with intercourse and avoid water-based lubricants. Intercourse is infrequent and could be cause for discomfort.      Tamela Gammon DNP, 3:41 PM 08/08/2022

## 2022-08-09 ENCOUNTER — Other Ambulatory Visit (HOSPITAL_COMMUNITY): Payer: Self-pay

## 2022-08-09 LAB — HIV ANTIBODY (ROUTINE TESTING W REFLEX): HIV 1&2 Ab, 4th Generation: NONREACTIVE

## 2022-08-09 LAB — SYPHILIS: RPR W/REFLEX TO RPR TITER AND TREPONEMAL ANTIBODIES, TRADITIONAL SCREENING AND DIAGNOSIS ALGORITHM: RPR Ser Ql: NONREACTIVE

## 2022-08-10 LAB — SURESWAB® ADVANCED VAGINITIS PLUS,TMA
C. trachomatis RNA, TMA: NOT DETECTED
CANDIDA SPECIES: NOT DETECTED
Candida glabrata: NOT DETECTED
N. gonorrhoeae RNA, TMA: NOT DETECTED
SURESWAB(R) ADV BACTERIAL VAGINOSIS(BV),TMA: POSITIVE — AB
TRICHOMONAS VAGINALIS (TV),TMA: NOT DETECTED

## 2022-08-12 ENCOUNTER — Other Ambulatory Visit (HOSPITAL_COMMUNITY): Payer: Self-pay

## 2022-08-12 ENCOUNTER — Other Ambulatory Visit: Payer: Self-pay | Admitting: Nurse Practitioner

## 2022-08-12 ENCOUNTER — Ambulatory Visit: Payer: BC Managed Care – PPO | Admitting: Nurse Practitioner

## 2022-08-12 DIAGNOSIS — B9689 Other specified bacterial agents as the cause of diseases classified elsewhere: Secondary | ICD-10-CM

## 2022-08-12 MED ORDER — METRONIDAZOLE 500 MG PO TABS
500.0000 mg | ORAL_TABLET | Freq: Two times a day (BID) | ORAL | 0 refills | Status: DC
  Filled 2022-08-12: qty 14, 7d supply, fill #0

## 2022-08-15 ENCOUNTER — Encounter: Payer: Self-pay | Admitting: Nurse Practitioner

## 2022-08-15 DIAGNOSIS — N898 Other specified noninflammatory disorders of vagina: Secondary | ICD-10-CM

## 2022-08-16 ENCOUNTER — Encounter: Payer: Self-pay | Admitting: Orthopedic Surgery

## 2022-08-16 ENCOUNTER — Other Ambulatory Visit: Payer: Self-pay | Admitting: Radiology

## 2022-08-16 ENCOUNTER — Other Ambulatory Visit (HOSPITAL_COMMUNITY): Payer: Self-pay

## 2022-08-16 ENCOUNTER — Telehealth: Payer: Self-pay | Admitting: Radiology

## 2022-08-16 DIAGNOSIS — M5137 Other intervertebral disc degeneration, lumbosacral region: Secondary | ICD-10-CM

## 2022-08-16 DIAGNOSIS — Z9889 Other specified postprocedural states: Secondary | ICD-10-CM

## 2022-08-16 DIAGNOSIS — M545 Low back pain, unspecified: Secondary | ICD-10-CM

## 2022-08-16 DIAGNOSIS — G8929 Other chronic pain: Secondary | ICD-10-CM

## 2022-08-16 DIAGNOSIS — M5136 Other intervertebral disc degeneration, lumbar region: Secondary | ICD-10-CM

## 2022-08-16 MED ORDER — FLUCONAZOLE 150 MG PO TABS
150.0000 mg | ORAL_TABLET | Freq: Once | ORAL | 0 refills | Status: AC
  Filled 2022-08-16: qty 3, 6d supply, fill #0

## 2022-08-16 MED ORDER — TRAMADOL HCL 50 MG PO TABS
50.0000 mg | ORAL_TABLET | Freq: Four times a day (QID) | ORAL | 0 refills | Status: DC | PRN
  Filled 2022-08-16: qty 20, 5d supply, fill #0

## 2022-08-16 NOTE — Addendum Note (Signed)
Addended by: Ileene Rubens on: 08/16/2022 06:21 PM   Modules accepted: Orders

## 2022-08-16 NOTE — Telephone Encounter (Signed)
I went to my apt with them today and they are not covered through my insurnace. I can not afford to pay the amont they said out of pocket.  Is their any other option I may have ? Also is their is anyway I can get a script for tramdol please. ------  I have put a new order in for [facility].   Please advise on pain meds.

## 2022-08-17 ENCOUNTER — Other Ambulatory Visit (HOSPITAL_COMMUNITY): Payer: Self-pay

## 2022-08-21 ENCOUNTER — Other Ambulatory Visit (HOSPITAL_COMMUNITY): Payer: Self-pay

## 2022-08-21 MED ORDER — TRAMADOL HCL 50 MG PO TABS
100.0000 mg | ORAL_TABLET | Freq: Two times a day (BID) | ORAL | 0 refills | Status: DC | PRN
  Filled 2022-08-21 – 2022-08-22 (×2): qty 56, 14d supply, fill #0

## 2022-08-22 ENCOUNTER — Other Ambulatory Visit (HOSPITAL_COMMUNITY): Payer: Self-pay

## 2022-08-22 ENCOUNTER — Other Ambulatory Visit: Payer: Self-pay

## 2022-09-05 ENCOUNTER — Other Ambulatory Visit (HOSPITAL_COMMUNITY): Payer: Self-pay

## 2022-09-05 MED ORDER — TRAMADOL HCL 50 MG PO TABS
100.0000 mg | ORAL_TABLET | Freq: Two times a day (BID) | ORAL | 0 refills | Status: DC | PRN
  Filled 2022-09-05: qty 120, 30d supply, fill #0

## 2022-09-05 MED ORDER — D 5000 125 MCG (5000 UT) PO CAPS
5000.0000 [IU] | ORAL_CAPSULE | Freq: Every day | ORAL | 3 refills | Status: DC
  Filled 2022-09-05: qty 30, 30d supply, fill #0
  Filled 2022-09-29 – 2023-01-27 (×5): qty 30, 30d supply, fill #1

## 2022-09-05 MED ORDER — GABAPENTIN 100 MG PO CAPS
100.0000 mg | ORAL_CAPSULE | Freq: Two times a day (BID) | ORAL | 0 refills | Status: DC
  Filled 2022-09-05: qty 60, 30d supply, fill #0

## 2022-09-29 ENCOUNTER — Other Ambulatory Visit (HOSPITAL_COMMUNITY): Payer: Self-pay

## 2022-09-30 ENCOUNTER — Other Ambulatory Visit (HOSPITAL_COMMUNITY): Payer: Self-pay

## 2022-10-01 ENCOUNTER — Other Ambulatory Visit: Payer: Self-pay

## 2022-10-01 ENCOUNTER — Other Ambulatory Visit (HOSPITAL_COMMUNITY): Payer: Self-pay

## 2022-10-02 ENCOUNTER — Ambulatory Visit: Payer: BC Managed Care – PPO | Admitting: Orthopedic Surgery

## 2022-10-02 ENCOUNTER — Other Ambulatory Visit (INDEPENDENT_AMBULATORY_CARE_PROVIDER_SITE_OTHER): Payer: BC Managed Care – PPO

## 2022-10-02 ENCOUNTER — Other Ambulatory Visit (HOSPITAL_COMMUNITY): Payer: Self-pay

## 2022-10-02 DIAGNOSIS — M545 Low back pain, unspecified: Secondary | ICD-10-CM

## 2022-10-02 DIAGNOSIS — Z9889 Other specified postprocedural states: Secondary | ICD-10-CM

## 2022-10-02 DIAGNOSIS — G8929 Other chronic pain: Secondary | ICD-10-CM

## 2022-10-02 DIAGNOSIS — M5127 Other intervertebral disc displacement, lumbosacral region: Secondary | ICD-10-CM

## 2022-10-02 NOTE — Progress Notes (Signed)
Orthopedic Spine Surgery Office Note  Assessment: Patient is a 37 y.o. female who is s/p L5/S1 microdiscectomy with Dr. Otelia Sergeant.  Her leg pain has resolved since surgery but continues to have low back pain - suspect this is discogenic from L5/S1   Plan: -Patient has tried tramadol, flexeril, Celebrex, gabapentin, physical therapy, pain management -Recommended she continue with core strengthening exercises and pain management -Told her that she can use a back brace when at work but should avoid using it for periods outside of that to prevent atrophy of her paraspinal and core muscles -Informed her that we could get another MRI down the road to evaluate for discogenic pain and consider a L5/S1 fusion but this would set her up for potential adjacent segment disease and given her age, I do not think that that is a great option.  She agreed with that and wants to hold off any further surgery at this time -Patient should return to office in 26 weeks, x-rays at next visit: AP/lateral/flex/ex lumbar   Patient expressed understanding of the plan and all questions were answered to the patient's satisfaction.   ___________________________________________________________________________  History: Patient is a 37 y.o. female who has been previously seen in the office for routine follow-up after L5/S1 microdiscectomy with Dr. Otelia Sergeant.  Her leg pain has completely resolved since the surgery.  She is still having low back pain.  She is now established with pain management and does feel that tramadol at night helps her sleep.  She is mostly just taking it at night.  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  Denies paresthesias and numbness.  Previous treatments: tramadol, Flexeril, Celebrex, gabapentin, physical therapy, pain management  Physical Exam:  General: no acute distress, appears stated age Neurologic: alert, answering questions appropriately,  following commands Respiratory: unlabored breathing on room air, symmetric chest rise Psychiatric: appropriate affect, normal cadence to speech   MSK (spine):  -Strength exam      Left  Right EHL    5/5  5/5 TA    5/5  5/5 GSC    5/5  5/5 Knee extension  5/5  5/5 Hip flexion   5/5  5/5  -Sensory exam    Sensation intact to light touch in L3-S1 nerve distributions of bilateral lower extremities  -Straight leg raise: negative bilaterally -Femoral nerve stretch test: negative bilaterally  -Clonus: no beats bilaterally  Imaging: XR of the lumbar spine from 10/02/2022 was independently reviewed and interpreted, showing disc height loss at L5/S1.  No other significant degenerative changes.  No evidence of instability on flexion/extension views.  No fracture or dislocation seen.   Patient name: Penny Thomas Patient MRN: [id number] Date of visit: 10/02/22

## 2022-10-03 ENCOUNTER — Other Ambulatory Visit: Payer: Self-pay

## 2022-10-04 ENCOUNTER — Other Ambulatory Visit (HOSPITAL_COMMUNITY): Payer: Self-pay

## 2022-10-04 MED ORDER — TRAMADOL HCL 50 MG PO TABS
100.0000 mg | ORAL_TABLET | Freq: Two times a day (BID) | ORAL | 0 refills | Status: DC | PRN
  Filled 2022-10-04: qty 120, 30d supply, fill #0

## 2022-10-04 MED ORDER — D 5000 125 MCG (5000 UT) PO CAPS
5000.0000 [IU] | ORAL_CAPSULE | Freq: Every day | ORAL | 3 refills | Status: DC
  Filled 2022-10-04: qty 30, 30d supply, fill #0

## 2022-10-04 MED ORDER — GABAPENTIN 100 MG PO CAPS
100.0000 mg | ORAL_CAPSULE | Freq: Two times a day (BID) | ORAL | 0 refills | Status: DC
  Filled 2022-10-04: qty 60, 30d supply, fill #0

## 2022-10-05 ENCOUNTER — Other Ambulatory Visit (HOSPITAL_COMMUNITY): Payer: Self-pay

## 2022-11-04 ENCOUNTER — Other Ambulatory Visit (HOSPITAL_COMMUNITY): Payer: Self-pay

## 2022-11-04 MED ORDER — D 5000 125 MCG (5000 UT) PO CAPS
ORAL_CAPSULE | ORAL | 3 refills | Status: DC
  Filled 2022-11-04: qty 30, 30d supply, fill #0
  Filled 2022-11-05: qty 30, fill #0

## 2022-11-04 MED ORDER — TRAMADOL HCL 50 MG PO TABS
100.0000 mg | ORAL_TABLET | Freq: Two times a day (BID) | ORAL | 0 refills | Status: DC
  Filled 2022-11-04: qty 120, 30d supply, fill #0

## 2022-11-04 MED ORDER — GABAPENTIN 300 MG PO CAPS
300.0000 mg | ORAL_CAPSULE | Freq: Two times a day (BID) | ORAL | 0 refills | Status: DC
  Filled 2022-11-04: qty 60, 30d supply, fill #0

## 2022-11-05 ENCOUNTER — Other Ambulatory Visit (HOSPITAL_COMMUNITY): Payer: Self-pay

## 2022-12-02 ENCOUNTER — Other Ambulatory Visit (HOSPITAL_COMMUNITY): Payer: Self-pay

## 2022-12-02 MED ORDER — GABAPENTIN 300 MG PO CAPS
300.0000 mg | ORAL_CAPSULE | Freq: Two times a day (BID) | ORAL | 0 refills | Status: DC
  Filled 2022-12-02 – 2022-12-27 (×3): qty 60, 30d supply, fill #0

## 2022-12-02 MED ORDER — D 5000 125 MCG (5000 UT) PO CAPS
5000.0000 [IU] | ORAL_CAPSULE | Freq: Every day | ORAL | 3 refills | Status: DC
  Filled 2022-12-02 – 2023-05-08 (×2): qty 30, 30d supply, fill #0

## 2022-12-02 MED ORDER — TRAMADOL HCL 50 MG PO TABS
100.0000 mg | ORAL_TABLET | Freq: Two times a day (BID) | ORAL | 0 refills | Status: DC | PRN
  Filled 2022-12-02: qty 120, 30d supply, fill #0

## 2022-12-03 ENCOUNTER — Other Ambulatory Visit (HOSPITAL_COMMUNITY): Payer: Self-pay

## 2022-12-10 ENCOUNTER — Other Ambulatory Visit (HOSPITAL_COMMUNITY): Payer: Self-pay

## 2022-12-22 ENCOUNTER — Encounter: Payer: Self-pay | Admitting: Neurology

## 2022-12-22 ENCOUNTER — Other Ambulatory Visit (HOSPITAL_COMMUNITY): Payer: Self-pay

## 2022-12-23 ENCOUNTER — Other Ambulatory Visit: Payer: Self-pay | Admitting: Neurology

## 2022-12-23 ENCOUNTER — Other Ambulatory Visit (HOSPITAL_COMMUNITY): Payer: Self-pay

## 2022-12-23 ENCOUNTER — Other Ambulatory Visit: Payer: Self-pay

## 2022-12-23 MED ORDER — NAPROXEN 500 MG PO TABS
500.0000 mg | ORAL_TABLET | Freq: Two times a day (BID) | ORAL | 1 refills | Status: DC | PRN
  Filled 2022-12-23: qty 60, 30d supply, fill #0
  Filled 2023-03-07: qty 60, 30d supply, fill #1

## 2022-12-24 ENCOUNTER — Other Ambulatory Visit: Payer: Self-pay

## 2022-12-27 ENCOUNTER — Other Ambulatory Visit (HOSPITAL_COMMUNITY): Payer: Self-pay

## 2022-12-27 ENCOUNTER — Other Ambulatory Visit: Payer: Self-pay

## 2023-01-01 ENCOUNTER — Other Ambulatory Visit (HOSPITAL_COMMUNITY): Payer: Self-pay

## 2023-01-01 MED ORDER — GABAPENTIN 300 MG PO CAPS
300.0000 mg | ORAL_CAPSULE | Freq: Two times a day (BID) | ORAL | 0 refills | Status: DC
  Filled 2023-01-01 – 2023-01-31 (×3): qty 60, 30d supply, fill #0

## 2023-01-01 MED ORDER — D 5000 125 MCG (5000 UT) PO CAPS
5000.0000 [IU] | ORAL_CAPSULE | Freq: Every day | ORAL | 3 refills | Status: DC
  Filled 2023-01-01: qty 30, 30d supply, fill #0
  Filled 2023-01-27: qty 30, 30d supply, fill #1

## 2023-01-01 MED ORDER — TRAMADOL HCL 50 MG PO TABS
100.0000 mg | ORAL_TABLET | Freq: Two times a day (BID) | ORAL | 0 refills | Status: DC | PRN
  Filled 2023-01-01: qty 120, 30d supply, fill #0

## 2023-01-02 ENCOUNTER — Other Ambulatory Visit (HOSPITAL_COMMUNITY): Payer: Self-pay

## 2023-01-23 IMAGING — CR DG LUMBAR SPINE 2-3V
2 series · 2 of 2 positions shown · non-contrast
Comparison: 08/08/2021.

CLINICAL DATA: Intraoperative localization for lumbar spine
surgery.

EXAM:
LUMBAR SPINE - 2-3 VIEW

[lateral (1 of 2)]
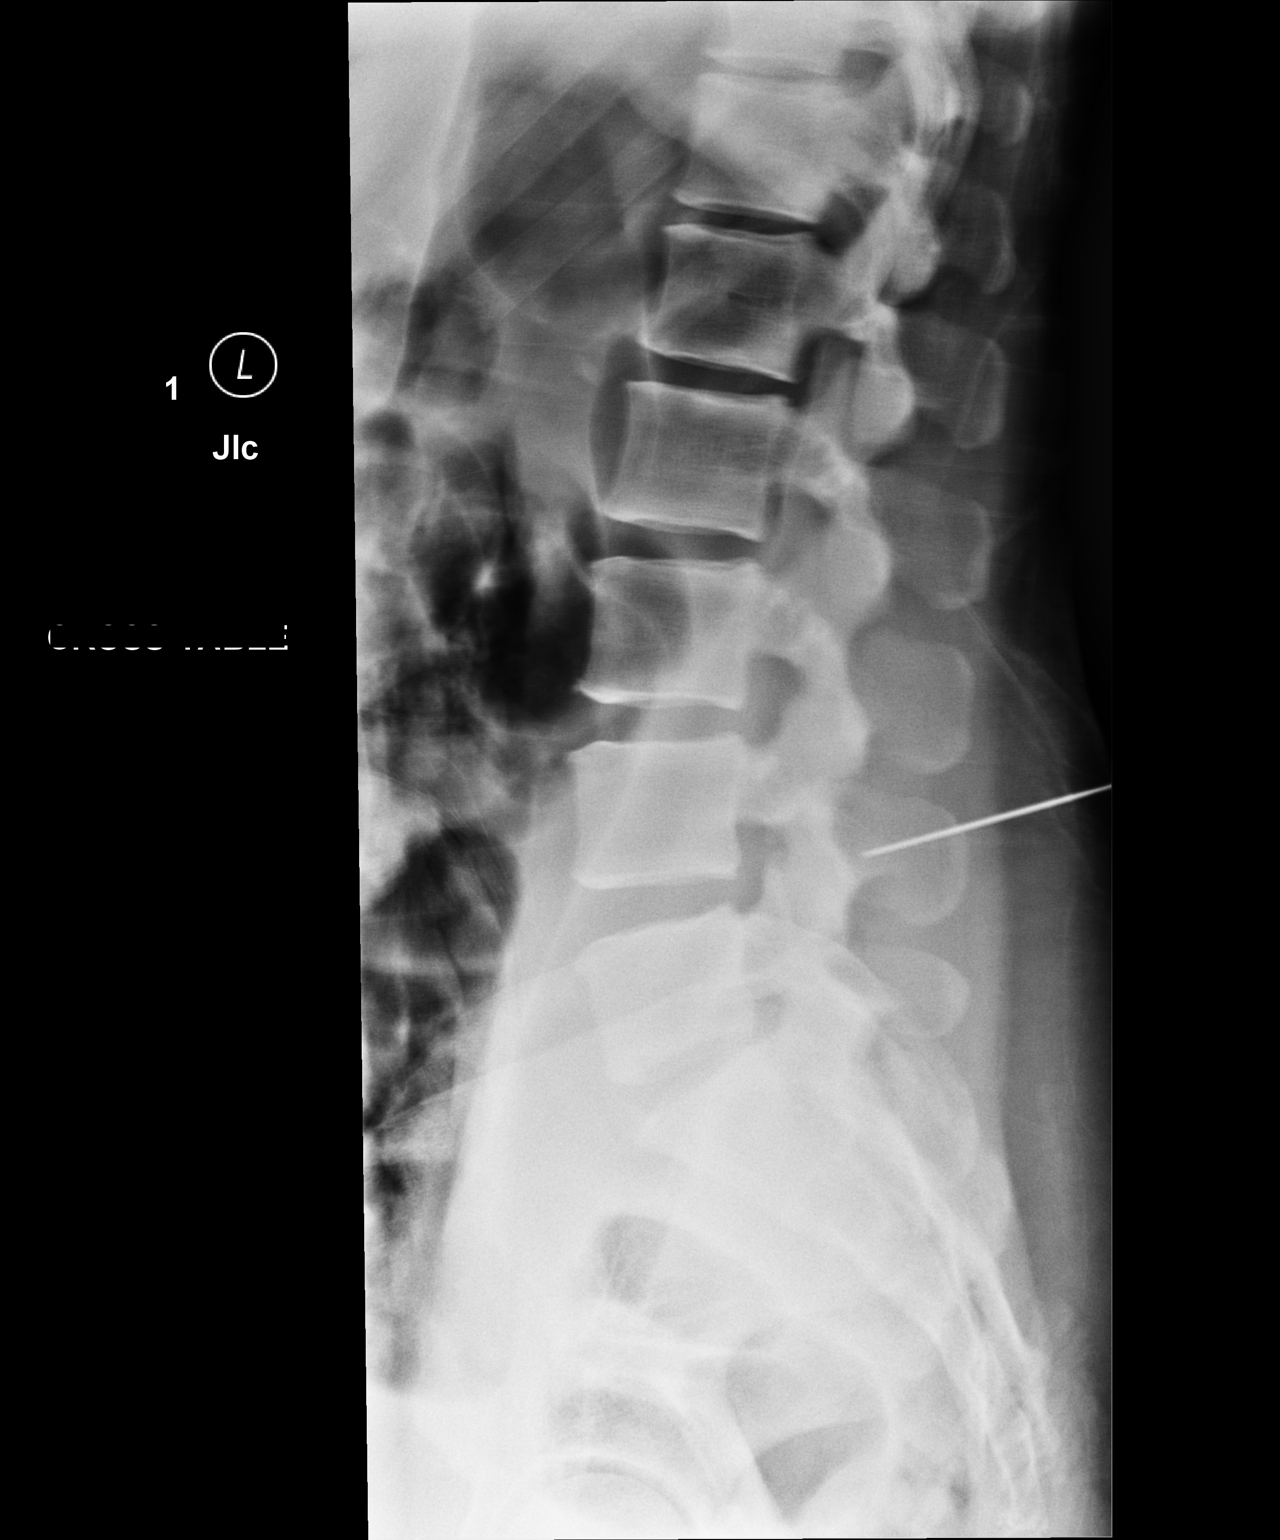

[lateral (2 of 2)]
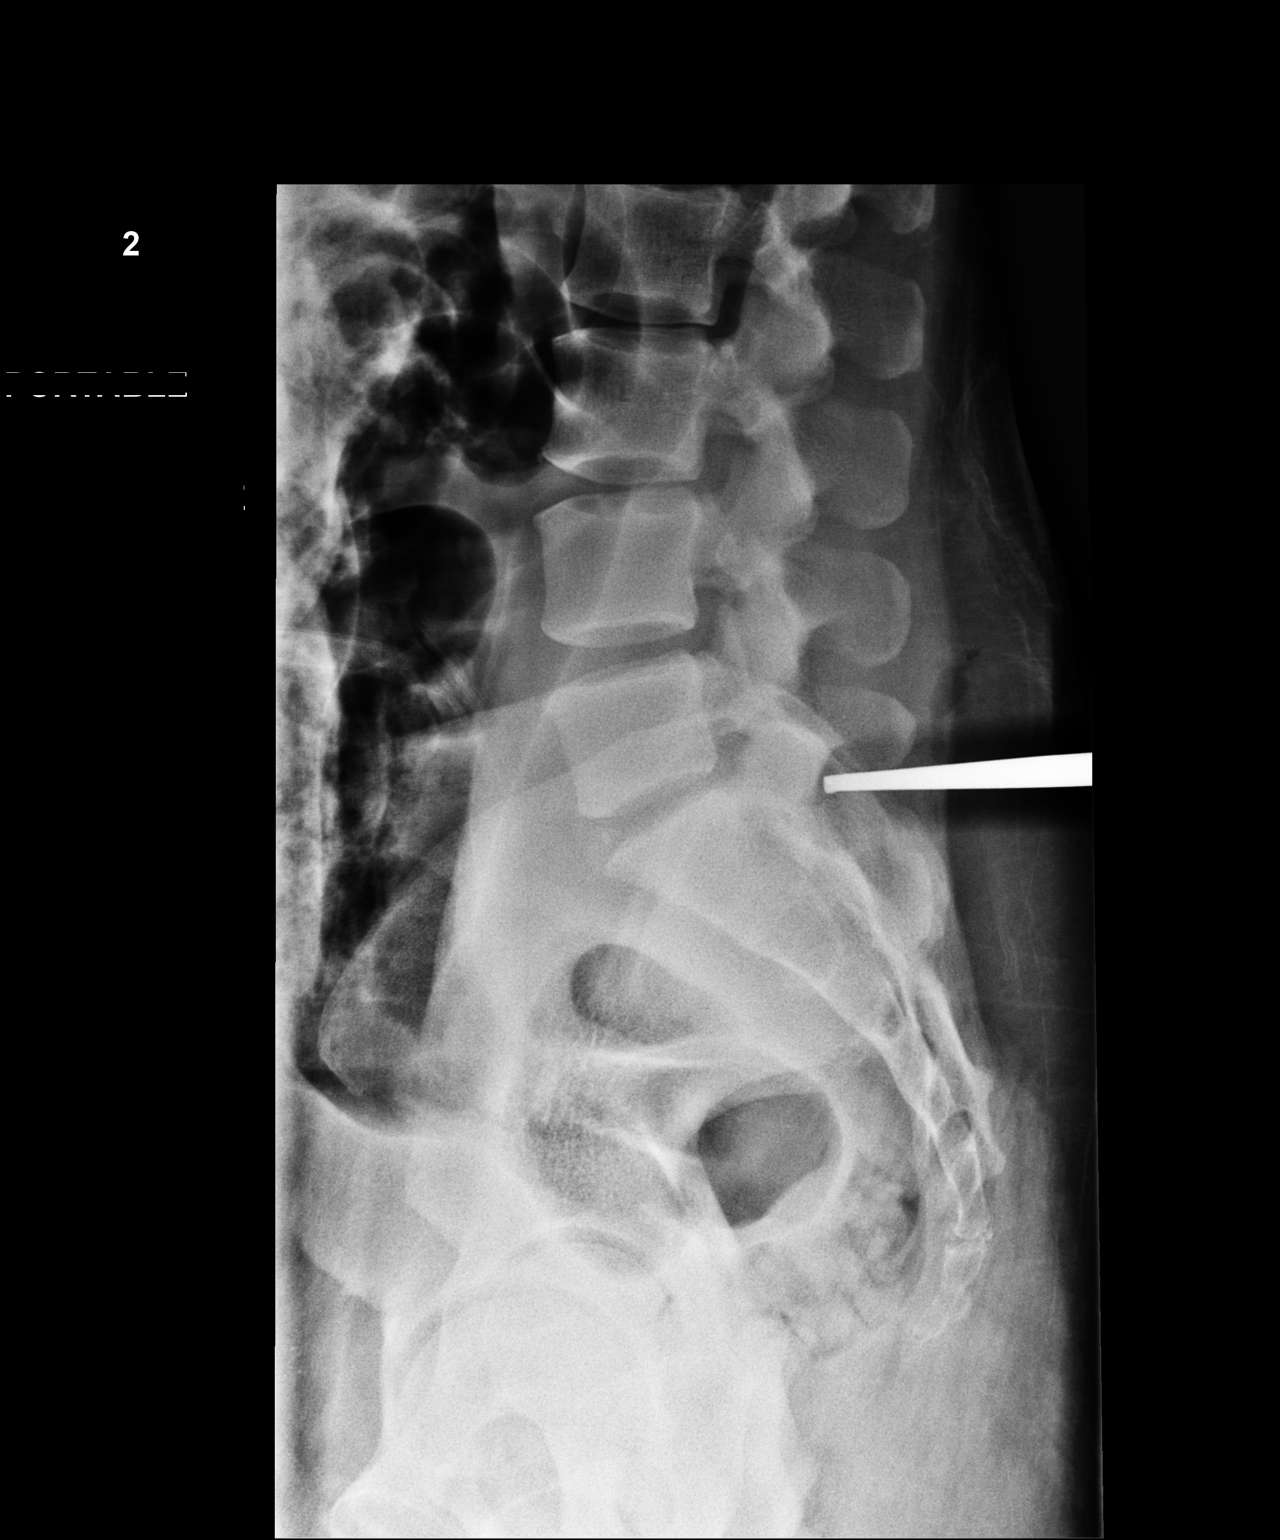

[2 of 2 positions shown; findings below may reference images not displayed]

FINDINGS: Two portable lateral lumbar spine radiograph submitted. Initial
image demonstrates a surgical needle with its tip projecting 3.1 cm
posterior to the posterior lower endplate of the L4 vertebra.
Subsequent image shows placement of a surgical instrument with its
tip projecting 2.7 cm posterior to the posterior upper endplate of
S1.
IMPRESSION: Surgical localization imaging as detailed

## 2023-01-27 ENCOUNTER — Other Ambulatory Visit (HOSPITAL_COMMUNITY): Payer: Self-pay

## 2023-01-28 ENCOUNTER — Other Ambulatory Visit (HOSPITAL_COMMUNITY): Payer: Self-pay

## 2023-01-28 ENCOUNTER — Other Ambulatory Visit: Payer: Self-pay

## 2023-01-31 ENCOUNTER — Other Ambulatory Visit (HOSPITAL_COMMUNITY): Payer: Self-pay

## 2023-02-01 ENCOUNTER — Other Ambulatory Visit (HOSPITAL_COMMUNITY): Payer: Self-pay

## 2023-02-01 MED ORDER — TRAMADOL HCL 50 MG PO TABS
100.0000 mg | ORAL_TABLET | Freq: Two times a day (BID) | ORAL | 0 refills | Status: DC | PRN
  Filled 2023-02-01: qty 120, 30d supply, fill #0

## 2023-02-01 MED ORDER — GABAPENTIN 400 MG PO CAPS
400.0000 mg | ORAL_CAPSULE | Freq: Two times a day (BID) | ORAL | 0 refills | Status: DC
Start: 2023-02-01 — End: 2023-03-01
  Filled 2023-02-01: qty 60, 30d supply, fill #0

## 2023-02-03 ENCOUNTER — Encounter (HOSPITAL_COMMUNITY): Payer: Self-pay

## 2023-02-03 ENCOUNTER — Other Ambulatory Visit (HOSPITAL_COMMUNITY): Payer: Self-pay

## 2023-02-03 MED ORDER — LIDOCAINE (ANORECTAL) 5 % EX CREA
TOPICAL_CREAM | CUTANEOUS | 0 refills | Status: DC
  Filled 2023-02-03: qty 85.05, 30d supply, fill #0

## 2023-02-04 ENCOUNTER — Other Ambulatory Visit (HOSPITAL_COMMUNITY): Payer: Self-pay

## 2023-02-04 ENCOUNTER — Encounter: Payer: Self-pay | Admitting: Orthopedic Surgery

## 2023-02-05 ENCOUNTER — Other Ambulatory Visit (HOSPITAL_COMMUNITY): Payer: Self-pay

## 2023-02-05 ENCOUNTER — Ambulatory Visit: Payer: BC Managed Care – PPO | Admitting: Family Medicine

## 2023-02-05 ENCOUNTER — Encounter: Payer: Self-pay | Admitting: Nurse Practitioner

## 2023-02-05 ENCOUNTER — Ambulatory Visit (INDEPENDENT_AMBULATORY_CARE_PROVIDER_SITE_OTHER): Payer: BC Managed Care – PPO | Admitting: Nurse Practitioner

## 2023-02-05 VITALS — BP 114/74 | HR 60 | Ht 67.0 in | Wt 186.4 lb

## 2023-02-05 DIAGNOSIS — Z0000 Encounter for general adult medical examination without abnormal findings: Secondary | ICD-10-CM | POA: Diagnosis not present

## 2023-02-05 DIAGNOSIS — G43809 Other migraine, not intractable, without status migrainosus: Secondary | ICD-10-CM

## 2023-02-05 DIAGNOSIS — F4010 Social phobia, unspecified: Secondary | ICD-10-CM

## 2023-02-05 DIAGNOSIS — R7989 Other specified abnormal findings of blood chemistry: Secondary | ICD-10-CM

## 2023-02-05 DIAGNOSIS — E010 Iodine-deficiency related diffuse (endemic) goiter: Secondary | ICD-10-CM

## 2023-02-05 DIAGNOSIS — R7303 Prediabetes: Secondary | ICD-10-CM

## 2023-02-05 DIAGNOSIS — Z1159 Encounter for screening for other viral diseases: Secondary | ICD-10-CM | POA: Diagnosis not present

## 2023-02-05 DIAGNOSIS — J4530 Mild persistent asthma, uncomplicated: Secondary | ICD-10-CM

## 2023-02-05 MED ORDER — AIRSUPRA 90-80 MCG/ACT IN AERO: 2.0000 | INHALATION_SPRAY | Freq: Four times a day (QID) | RESPIRATORY_TRACT | Status: AC | PRN

## 2023-02-05 MED ORDER — SERTRALINE HCL 50 MG PO TABS
50.0000 mg | ORAL_TABLET | Freq: Every day | ORAL | 3 refills | Status: DC
Start: 2023-02-05 — End: 2023-06-09
  Filled 2023-02-05: qty 30, 30d supply, fill #0
  Filled 2023-03-02: qty 30, 30d supply, fill #1
  Filled 2023-04-01: qty 30, 30d supply, fill #2
  Filled 2023-05-02 – 2023-05-14 (×2): qty 30, 30d supply, fill #3

## 2023-02-05 NOTE — Assessment & Plan Note (Signed)

## 2023-02-05 NOTE — Assessment & Plan Note (Addendum)
Chronic. Stable. She tends to have worse symptoms in the fall with allergy season. I recommended transitioning from albuterol to airsupra (albuterol/budesonide) for rescue inhaler based on asthma guideline changes. I have provided her with a sample today to use and a coupon card. If this medication is effective for her, we will plan to transition with follow-up in 4-6 weeks.  Plan: - Try the new inhaler and let me know if this works well for you. When we follow-up I will be happy to send this in as a prescription to continue.

## 2023-02-05 NOTE — Patient Instructions (Signed)
WEIGHT LOSS PLANNING Your progress today shows:     02/05/2023    8:41 AM 08/08/2022    3:27 PM 06/07/2022    9:18 AM  Vitals with BMI  Height 5\' 7"   5\' 7"   Weight 186 lbs 6 oz  196 lbs 6 oz  BMI 29.19  30.75  Systolic 114 122 161  Diastolic 74 68 74  Pulse 60 79 97    For best management of weight, it is vital to balance intake versus output. This means the number of calories burned per day must be less than the calories you take in with food and drink.   I recommend trying to follow a diet with the following: Calories: 1200-1500 calories per day Carbohydrates: 150-180 grams of carbohydrates per day  Why: Gives your body enough "quick fuel" for cells to maintain normal function without sending them into starvation mode.  Protein: At least 90 grams of protein per day- 30 grams with each meal Why: Protein takes longer and uses more energy than carbohydrates to break down for fuel. The carbohydrates in your meals serves as quick energy sources and proteins help use some of that extra quick energy to break down to produce long term energy. This helps you not feel hungry as quickly and protein breakdown burns calories.  Water: Drink AT LEAST 64 ounces of water per day  Why: Water is essential to healthy metabolism. Water helps to fill the stomach and keep you fuller longer. Water is required for healthy digestion and filtering of waste in the body.  Fat: Limit fats in your diet- when choosing fats, choose foods with lower fats content such as lean meats (chicken, fish, [place]).  Why: Increased fat intake leads to storage "for later". Once you burn your carbohydrate energy, your body goes into fat and protein breakdown mode to help you loose weight.  Cholesterol: Fats and oils that are LIQUID at room temperature are best. Choose vegetable oils (olive oil, avocado oil, nuts). Avoid fats that are SOLID at room temperature (animal fats, processed meats). Healthy fats are often found in whole grains,  beans, nuts, seeds, and berries.  Why: Elevated cholesterol levels lead to build up of cholesterol on the inside of your blood vessels. This will eventually cause the blood vessels to become hard and can lead to high blood pressure and damage to your organs. When the blood flow is reduced, but the pressure is high from cholesterol buildup, parts of the cholesterol can break off and form clots that can go to the brain or heart leading to a stroke or heart attack.  Fiber: Increase amount of SOLUBLE the fiber in your diet. This helps to fill you up, lowers cholesterol, and helps with digestion. Some foods high in soluble fiber are oats, peas, beans, apples, carrots, barley, and citrus fruits.   Why: Fiber fills you up, helps remove excess cholesterol, and aids in healthy digestion which are all very important in weight management.   I recommend the following as a minimum activity routine: Purposeful walk or other physical activity at least 20 minutes every single day. This means purposefully taking a walk, jog, bike, swim, treadmill, elliptical, dance, etc.  This activity should be ABOVE your normal daily activities, such as walking at work. Goal exercise should be at least 150 minutes a week- work your way up to this.   Heart Rate: Your maximum exercise heart rate should be 220 - Your Age in Years. When exercising, get your heart rate  up, but avoid going over the maximum targeted heart rate.  60-70% of your maximum heart rate is where you tend to burn the most fat. To find this number:  220 - Age In Years= Max HR  Max HR x 0.6 (or 0.7) = Fat Burning HR The Fat Burning HR is your goal heart rate while working out to burn the most fat.  NEVER exercise to the point your feel lightheaded, weak, nauseated, dizzy. If you experience ANY of these symptoms- STOP exercise! Allow yourself to cool down and your heart rate to come down. Then restart slower next time.  If at ANY TIME you feel chest pain or chest  pressure during exercise, STOP IMMEDIATELY and seek medical attention.

## 2023-02-05 NOTE — Assessment & Plan Note (Signed)
Chronic. Diet managed at this time. No alarm symptoms. She is struggling with weight loss, she has plateaud. We discussed how blood sugars can contribute to this and how to better manage blood sugars with diet to prevent ups and downs.  Plan: - We will get labs today to make sure your blood sugar is stable.  - If your blood sugar has gone up, we can consider medications to help

## 2023-02-05 NOTE — Assessment & Plan Note (Signed)
Symptoms of increased anxiety and panic when in social situations or surrounded by people. Anxiety is severe enough that she must excuse herself and she is unable to go back into the situation. We discussed options for treatment including restart sertraline vs trial of venlafaxine. Given that she tolerated the sertraline well previously and this is an easier medication to taper, a joint decision was made to start with this option. No alarm symptoms are present at this time.  Plan: - Start sertraline at 25mg  (1/2 tab) for 4 days then increase to 50mg  (1 tab) daily. We will follow-up in 4-6 weeks to see how you feel you are doing and titrate the medication up if needed.  - Typically for social anxiety situations higher doses are required for good control, therefore, please do not get discouraged if you don't feel the dose is working immediately.

## 2023-02-05 NOTE — Progress Notes (Signed)
Shawna Clamp, DNP, AGNP-c [facility] [address] [place], [place] [address] Main Office [phone]  BP 114/74   Pulse 60   Ht 5\' 7"  (1.702 m)   Wt 186 lb 6.4 oz (84.6 kg)   LMP 11/29/2015   BMI 29.19 kg/m    Subjective:    Patient ID: Penny Thomas, female    DOB: 1985/09/27, 37 y.o.   MRN: [id number]  HPI: Penny Thomas is a 37 y.o. female presenting on 02/05/2023 for comprehensive medical examination.   Current medical concerns include: Anxiety [name] tells me that she has significant increase in anxiety particularly when out in public or around groups of people.  She reports a feeling of being closed in or that people are "all around me" when there are people in her general vicinity.  She tells me that she typically just leaves the location as a way to cope/manage this.  She does have a history of panic and anxiety in the past.  She has tried fluoxetine and sertraline.  She tells me that she does not feel she took the sertraline consistently enough to know whether it was helpful or not but she denies any side effects from the medication.  We discussed the option of restarting medication and she is interested in this.   Pertinent items are noted in HPI.  IMMUNIZATIONS:   Flu Vaccine: Flu vaccine declined, patient does not wish to complete Prevnar 13: Prevnar 13 N/A for this patient Prevnar 20: Prevnar 20 N/A for this patient Pneumovax 23: Pneumovax 23 N/A for this patient Vac Shingrix: Shingrix N/A for this patient HPV: HPV N/A for this patient Tetanus: Tetanus completed in the last 10 years COVID: Declined, patient does not wish to have this vaccine RSV: No  HEALTH MAINTENANCE: Pap Smear HM Status: is up to date Mammogram HM Status: N/A Colon Cancer Screening HM Status: N/A Bone Density HM Status: N/A STI Testing HM Status: was declined  Lung CT HM Status: N/A  Concerns with vision, hearing, or dentition: No  Dietary  Habits: She tells me she tends to skip breakfast and typically eats a late lunch then is not hungry for dinner.  She does have concerns with difficulty losing weight. Exercise: Sedentary  Most Recent Depression Screen:     02/05/2023    8:41 AM 10/24/2021    2:35 PM 09/13/2020    8:58 AM 06/16/2019    9:02 AM 12/02/2018    1:49 PM  Depression screen PHQ 2/9  Decreased Interest 0 0 0 0 0  Down, Depressed, Hopeless 0 0 0 0 0  PHQ - 2 Score 0 0 0 0 0  Altered sleeping  0     Tired, decreased energy  0     Change in appetite  0     Feeling bad or failure about yourself   0     Trouble concentrating  0     Moving slowly or fidgety/restless  0     Suicidal thoughts  0     PHQ-9 Score  0     Difficult doing work/chores  Not difficult at all      Most Recent Anxiety Screen:     06/16/2019    9:02 AM  GAD 7 : Generalized Anxiety Score  Nervous, Anxious, on Edge 2  Control/stop worrying 0  Worry too much - different things 0  Trouble relaxing 0  Restless 0  Easily annoyed or irritable 1  Afraid - awful might happen  1  Total GAD 7 Score 4  Anxiety Difficulty Not difficult at all   Most Recent Fall Screen:    02/05/2023    8:41 AM 08/08/2022    3:30 PM 06/07/2022    9:18 AM 10/24/2021    2:35 PM 05/22/2021    8:19 AM  Fall Risk   Falls in the past year? 0 0 0 0 0  Number falls in past yr: 0 0 0 0 0  Injury with Fall? 0 0 0 0 0  Risk for fall due to : No Fall Risks   No Fall Risks   Follow up Falls evaluation completed  Falls evaluation completed Falls evaluation completed     Past medical history, surgical history, medications, allergies, family history and social history reviewed with patient today and changes made to appropriate areas of the chart.  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   Great toe pain, left 06/16/2019   H/O abdominal supracervical subtotal hysterectomy 12/01/2019   H/O: hysterectomy 10/2019   Hemorrhage in uterus 01/20/2009   History of COVID-19 05/31/2020   History of gestational diabetes 12/02/2018   Hypertension    Migraines    Muscle cramps 03/23/2015   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   Bacillus Coagulans-Inulin (PROBIOTIC-PREBIOTIC PO) Take 1 capsule by mouth daily.   beclomethasone (QVAR) 80 MCG/ACT inhaler Inhale 1 puff into the lungs 2 (two) times daily.   BIOTIN PO Take 1 tablet by mouth daily.   cetirizine (ZYRTEC) 10 MG tablet Take 1 tablet by mouth daily.   Cholecalciferol (D 5000) 125 MCG (5000 UT) capsule Take 1 capsule (5,000 Units total) by mouth daily.   Cholecalciferol (D 5000) 125 MCG (5000 UT) capsule Take 1 capsule (5,000 Units total) by mouth daily.   docusate sodium (COLACE) 100 MG capsule Take 1 capsule (100 mg total) by mouth 2 (two) times daily.   gabapentin (NEURONTIN) 400 MG capsule Take 1 capsule (400 mg total) by mouth 2 (two) times daily.   Lidocaine, Anorectal, 5 % CREA Apply to affected skin 3 times per day as needed for pain   Multiple Vitamin  (MULTIVITAMIN WITH MINERALS) TABS tablet Take 1 tablet by mouth daily.   naproxen (NAPROSYN) 500 MG tablet Take 1 tablet (500 mg total) by mouth every 12 (twelve) hours as needed.   propranolol ER (INDERAL LA) 120 MG 24 hr capsule Take  1 capsule (120 mg total) by mouth at bedtime.   simethicone (MYLICON) 125 MG chewable tablet Chew 125 mg by mouth in the morning and at bedtime.   traMADol (ULTRAM) 50 MG tablet Take 2 tablets (100 mg total) by mouth 2 (two) times daily as needed.   metroNIDAZOLE (FLAGYL) 500 MG tablet Take 1 tablet (500 mg total) by mouth 2 (two) times daily. (Patient not taking: Reported on 02/05/2023)   SUMAtriptan 6 MG/0.5ML SOAJ INJECT 6 MG INTO THE SKIN AS NEEDED (MAY REPEAT IN ONE HOUR. MAXIMUM 2 INJECTIONS IN 24 HOURS.).   [DISCONTINUED] esomeprazole (NEXIUM) 40 MG capsule TAKE 1 CAPSULE BY MOUTH ONCE A DAY AT 12 NOON   No current facility-administered medications on file prior to visit.   Surgical History:  Past Surgical History:  Procedure Laterality Date   ABDOMINAL HYSTERECTOMY     CESAREAN SECTION  09/28/2005   CESAREAN SECTION  01/20/2009   CESAREAN SECTION     DIAGNOSTIC LAPAROSCOPY     ENDOMETRIAL ABLATION  10/2011   HEROPTIOIN   HERNIA REPAIR     bilateral groins as a child   KNEE ARTHROSCOPY Right 02/16/2014   Procedure: RIGHT ARTHROSCOPY KNEE;  Surgeon: Nestor Lewandowsky, MD;  Location: [facility];  Service: Orthopedics;  Laterality: Right;   LUMBAR LAMINECTOMY/DECOMPRESSION MICRODISCECTOMY N/A 11/12/2021   Procedure: BILATERAL LUMBAR FIVE - SACRAL ONE MICRODISCECTOMY;  Surgeon: Kerrin Champagne, MD;  Location: [facility] OR;  Service: Orthopedics;  Laterality: N/A;   POLYP REMOVAL  09/2006   ?CERVICAL OR UTERINE POLYP   ROBOTIC ASSISTED LAPAROSCOPIC HYSTERECTOMY AND SALPINGECTOMY Bilateral 12/01/2019   Procedure: XI ROBOTIC ASSISTEDTOTAL TOTAL LAPAROSCOPIC HYSTERECTOMY AND RIGHT SALPINGECTOMY , EXTENSIVE LYSIS OF ADHESIONS;  Surgeon: Genia Del,  MD;  Location: [facility];  Service: Gynecology;  Laterality: Bilateral;  request 8:30am OR time. Requests 2 hours   TUBAL LIGATION  01/20/2009   DONE WITH C-SECTION   WISDOM TOOTH EXTRACTION Right 2014   Allergies:  Allergies  Allergen Reactions   [name] [Elagolix] Other (See Comments)    Hallucinations   Family History:  Family History  Problem Relation Age of Onset   Fibromyalgia Mother    Multiple sclerosis Mother    Asthma Father    Healthy Maternal Grandmother    Healthy Maternal Grandfather    Healthy Paternal Grandmother    Healthy Paternal Grandfather    Endometriosis Sister    Healthy Brother    Healthy Child        Objective:    BP 114/74   Pulse 60   Ht 5\' 7"  (1.702 m)   Wt 186 lb 6.4 oz (84.6 kg)   LMP 11/29/2015   BMI 29.19 kg/m   Wt Readings from Last 3 Encounters:  02/05/23 186 lb 6.4 oz (84.6 kg)  06/07/22 196 lb 6.4 oz (89.1 kg)  04/03/22 187 lb (84.8 kg)    Physical Exam Vitals and nursing note reviewed.  Constitutional:      General: She is not in acute distress.    Appearance: Normal appearance.  HENT:     Head: Normocephalic and atraumatic.     Right Ear: Hearing, tympanic membrane, ear canal and external ear normal.     Left Ear: Hearing, tympanic membrane, ear canal and external ear normal.     Nose: Nose normal.     Right Sinus: No maxillary sinus tenderness or frontal sinus tenderness.     Left Sinus: No maxillary sinus tenderness or frontal  sinus tenderness.     Mouth/Throat:     Lips: Pink.     Mouth: Mucous membranes are moist.     Pharynx: Oropharynx is clear.  Eyes:     General: Lids are normal. Vision grossly intact.     Extraocular Movements: Extraocular movements intact.     Conjunctiva/sclera: Conjunctivae normal.     Pupils: Pupils are equal, round, and reactive to light.     Funduscopic exam:    Right eye: Red reflex present.        Left eye: Red reflex present.    Visual Fields: Right eye visual  fields normal and left eye visual fields normal.  Neck:     Thyroid: Thyromegaly present.     Vascular: No carotid bruit.  Cardiovascular:     Rate and Rhythm: Normal rate and regular rhythm.     Chest Wall: PMI is not displaced.     Pulses: Normal pulses.          Dorsalis pedis pulses are 2+ on the right side and 2+ on the left side.       Posterior tibial pulses are 2+ on the right side and 2+ on the left side.     Heart sounds: Normal heart sounds. No murmur heard. Pulmonary:     Effort: Pulmonary effort is normal. No respiratory distress.     Breath sounds: Normal breath sounds.  Abdominal:     General: Abdomen is flat. Bowel sounds are normal. There is no distension.     Palpations: Abdomen is soft. There is no hepatomegaly, splenomegaly or mass.     Tenderness: There is no abdominal tenderness. There is no right CVA tenderness, left CVA tenderness, guarding or rebound.  Musculoskeletal:        General: Normal range of motion.     Cervical back: Full passive range of motion without pain, normal range of motion and neck supple. No tenderness.     Right lower leg: No edema.     Left lower leg: No edema.  Feet:     Left foot:     Toenail Condition: Left toenails are normal.  Lymphadenopathy:     Cervical: No cervical adenopathy.     Upper Body:     Right upper body: No supraclavicular adenopathy.     Left upper body: No supraclavicular adenopathy.  Skin:    General: Skin is warm and dry.     Capillary Refill: Capillary refill takes less than 2 seconds.     Nails: There is no clubbing.  Neurological:     General: No focal deficit present.     Mental Status: She is alert and oriented to person, place, and time.     GCS: GCS eye subscore is 4. GCS verbal subscore is 5. GCS motor subscore is 6.     Sensory: Sensation is intact.     Motor: Motor function is intact.     Coordination: Coordination is intact.     Gait: Gait is intact.     Deep Tendon Reflexes: Reflexes are  normal and symmetric.  Psychiatric:        Attention and Perception: Attention normal.        Mood and Affect: Mood normal.        Speech: Speech normal.        Behavior: Behavior normal. Behavior is cooperative.        Thought Content: Thought content normal.        Cognition  and Memory: Cognition and memory normal.        Judgment: Judgment normal.     Results for orders placed or performed in visit on 08/08/22  SureSwab Advanced Vaginitis Plus,TMA   Specimen: Genital  Result Value Ref Range   SURESWAB(R) ADV BACTERIAL VAGINOSIS(BV),TMA POSITIVE (A) NEGATIVE   CANDIDA SPECIES NOT DETECTED NOT DETECTED   Candida glabrata NOT DETECTED NOT DETECTED   TRICHOMONAS VAGINALIS (TV),TMA NOT DETECTED NOT DETECTED   C. trachomatis RNA, TMA NOT DETECTED NOT DETECTED   N. gonorrhoeae RNA, TMA NOT DETECTED NOT DETECTED  RPR  Result Value Ref Range   RPR Ser Ql NON-REACTIVE NON-REACTIVE  HIV Antibody (routine testing w rflx)  Result Value Ref Range   HIV 1&2 Ab, 4th Generation NON-REACTIVE NON-REACTIVE         Assessment & Plan:   Problem List Items Addressed This Visit     Migraines    Chronic. Currently managed with neurology. She reports excellent control with propranolol and PRN naproxen. She is using sumatriptan in the event of a breakthrough headache. No alarm symptoms are present. I will continue to provide supportive care with neurology.  Plan: - Continue with your current treatment plan.      Relevant Medications   sertraline (ZOLOFT) 50 MG tablet   Prediabetes    Chronic. Diet managed at this time. No alarm symptoms. She is struggling with weight loss, she has plateaud. We discussed how blood sugars can contribute to this and how to better manage blood sugars with diet to prevent ups and downs.  Plan: - We will get labs today to make sure your blood sugar is stable.  - If your blood sugar has gone up, we can consider medications to help      Relevant Orders   CBC  with Differential/Platelet   CMP14+EGFR   Hemoglobin A1c   Lipid panel   TSH   Mild persistent asthma without complication    Chronic. Stable. She tends to have worse symptoms in the fall with allergy season. I recommended transitioning from albuterol to airsupra (albuterol/budesonide) for rescue inhaler based on asthma guideline changes. I have provided her with a sample today to use and a coupon card. If this medication is effective for her, we will plan to transition with follow-up in 4-6 weeks.  Plan: - Try the new inhaler and let me know if this works well for you. When we follow-up I will be happy to send this in as a prescription to continue.       Relevant Medications   Albuterol-Budesonide (AIRSUPRA) 90-80 MCG/ACT AERO   Other Relevant Orders   CBC with Differential/Platelet   CMP14+EGFR   Hemoglobin A1c   Lipid panel   TSH   Thyromegaly   Social anxiety disorder    Symptoms of increased anxiety and panic when in social situations or surrounded by people. Anxiety is severe enough that she must excuse herself and she is unable to go back into the situation. We discussed options for treatment including restart sertraline vs trial of venlafaxine. Given that she tolerated the sertraline well previously and this is an easier medication to taper, a joint decision was made to start with this option. No alarm symptoms are present at this time.  Plan: - Start sertraline at 25mg  (1/2 tab) for 4 days then increase to 50mg  (1 tab) daily. We will follow-up in 4-6 weeks to see how you feel you are doing and titrate the medication up if needed.  -  Typically for social anxiety situations higher doses are required for good control, therefore, please do not get discouraged if you don't feel the dose is working immediately.        Relevant Medications   sertraline (ZOLOFT) 50 MG tablet   Encounter for annual physical exam - Primary    CPE completed today. Review of HM activities and  recommendations discussed and provided on AVS. Anticipatory guidance, diet, and exercise recommendations provided. Medications, allergies, and hx reviewed and updated as necessary. Orders placed as listed below.  Plan: - Labs ordered. Will make changes as necessary based on results.  - I will review these results and send recommendations via MyChart or a telephone call.  - F/U with CPE in 1 year or sooner for acute/chronic health needs as directed.        Relevant Orders   CBC with Differential/Platelet   CMP14+EGFR   Hemoglobin A1c   Lipid panel   TSH   Other Visit Diagnoses     Encounter for hepatitis C screening test for low risk patient       Relevant Orders   Hepatitis C antibody          Follow up plan: Return in about 6 weeks (around 03/19/2023) for Med Management 30- virtual ok.  NEXT PREVENTATIVE PHYSICAL DUE IN 1 YEAR.  PATIENT COUNSELING PROVIDED FOR ALL ADULT PATIENTS: A well balanced diet low in saturated fats, cholesterol, and moderation in carbohydrates.  This can be as simple as monitoring portion sizes and cutting back on sugary beverages such as soda and juice to start with.    Daily water consumption of at least 64 ounces.  Physical activity at least 180 minutes per week.  If just starting out, start 10 minutes a day and work your way up.   This can be as simple as taking the stairs instead of the elevator and walking 2-3 laps around the office  purposefully every day.   STD protection, partner selection, and regular testing if high risk.  Limited consumption of alcoholic beverages if alcohol is consumed. For men, I recommend no more than 14 alcoholic beverages per week, spread out throughout the week (max 2 per day). Avoid "binge" drinking or consuming large quantities of alcohol in one setting.  Please let me know if you feel you may need help with reduction or quitting alcohol consumption.   Avoidance of nicotine, if used. Please let me know if  you feel you may need help with reduction or quitting nicotine use.   Daily mental health attention. This can be in the form of 5 minute daily meditation, prayer, journaling, yoga, reflection, etc.  Purposeful attention to your emotions and mental state can significantly improve your overall wellbeing  and  Health.  Please know that I am here to help you with all of your health care goals and am happy to work with you to find a solution that works best for you.  The greatest advice I have received with any changes in life are to take it one step at a time, that even means if all you can focus on is the next 60 seconds, then do that and celebrate your victories.  With any changes in life, you will have set backs, and that is OK. The important thing to remember is, if you have a set back, it is not a failure, it is an opportunity to try again! Screening Testing Mammogram Every 1 -2 years based on history and risk  factors Starting at age 3 Pap Smear Ages 21-39 every 3 years Ages 94-65 every 5 years with HPV testing More frequent testing may be required based on results and history Colon Cancer Screening Every 1-10 years based on test performed, risk factors, and history Starting at age 50 Bone Density Screening Every 2-10 years based on history Starting at age 64 for women Recommendations for men differ based on medication usage, history, and risk factors AAA Screening One time ultrasound Men 85-68 years old who have every smoked Lung Cancer Screening Low Dose Lung CT every 12 months Age 89-80 years with a 30 pack-year smoking history who still smoke or who have quit within the last 15 years   Screening Labs Routine  Labs: Complete Blood Count (CBC), Complete Metabolic Panel (CMP), Cholesterol (Lipid Panel) Every 6-12 months based on history and medications May be recommended more frequently based on current conditions or previous results Hemoglobin A1c Lab Every 3-12 months based  on history and previous results Starting at age 720 or earlier with diagnosis of diabetes, high cholesterol, BMI >26, and/or risk factors Frequent monitoring for patients with diabetes to ensure blood sugar control Thyroid Panel (TSH) Every 6 months based on history, symptoms, and risk factors May be repeated more often if on medication HIV One time testing for all patients 19 and older May be repeated more frequently for patients with increased risk factors or exposure Hepatitis C One time testing for all patients 21 and older May be repeated more frequently for patients with increased risk factors or exposure Gonorrhea, Chlamydia Every 12 months for all sexually active persons 13-24 years Additional monitoring may be recommended for those who are considered high risk or who have symptoms Every 12 months for any woman on birth control, regardless of sexual activity PSA Men 86-28 years old with risk factors Additional screening may be recommended from age 34-69 based on risk factors, symptoms, and history  Vaccine Recommendations Tetanus Booster All adults every 10 years Flu Vaccine All patients 6 months and older every year COVID Vaccine All patients 12 years and older Initial dosing with booster May recommend additional booster based on age and health history HPV Vaccine 2 doses all patients age 72-26 Dosing may be considered for patients over 26 Shingles Vaccine (Shingrix) 2 doses all adults 55 years and older Pneumonia (Pneumovax 72) All adults 65 years and older May recommend earlier dosing based on health history One year apart from Prevnar 49 Pneumonia (Prevnar 13) All adults 65 years and older Dosed 1 year after Pneumovax 23 Pneumonia (Prevnar 20) One time alternative to the two dosing of 13 and 23 For all adults with initial dose of 23, 20 is recommended 1 year later For all adults with initial dose of 13, 23 is still recommended as second option 1 year  later

## 2023-02-05 NOTE — Assessment & Plan Note (Signed)
Chronic. Currently managed with neurology. She reports excellent control with propranolol and PRN naproxen. She is using sumatriptan in the event of a breakthrough headache. No alarm symptoms are present. I will continue to provide supportive care with neurology.  Plan: - Continue with your current treatment plan.

## 2023-02-06 LAB — CBC WITH DIFFERENTIAL/PLATELET
Basophils Absolute: 0 10*3/uL (ref 0.0–0.2)
Basos: 0 %
EOS (ABSOLUTE): 0.1 10*3/uL (ref 0.0–0.4)
Eos: 1 %
Hematocrit: 41.6 % (ref 34.0–46.6)
Hemoglobin: 13.5 g/dL (ref 11.1–15.9)
Immature Grans (Abs): 0 10*3/uL (ref 0.0–0.1)
Immature Granulocytes: 0 %
Lymphocytes Absolute: 1.8 10*3/uL (ref 0.7–3.1)
Lymphs: 36 %
MCH: 28.6 pg (ref 26.6–33.0)
MCHC: 32.5 g/dL (ref 31.5–35.7)
MCV: 88 fL (ref 79–97)
Monocytes Absolute: 0.4 10*3/uL (ref 0.1–0.9)
Monocytes: 8 %
Neutrophils Absolute: 2.7 10*3/uL (ref 1.4–7.0)
Neutrophils: 55 %
Platelets: 266 10*3/uL (ref 150–450)
RBC: 4.72 x10E6/uL (ref 3.77–5.28)
RDW: 14 % (ref 11.7–15.4)
WBC: 5 10*3/uL (ref 3.4–10.8)

## 2023-02-06 LAB — CMP14+EGFR
ALT: 8 IU/L (ref 0–32)
AST: 13 IU/L (ref 0–40)
Albumin: 4.7 g/dL (ref 3.9–4.9)
Alkaline Phosphatase: 54 IU/L (ref 44–121)
BUN/Creatinine Ratio: 13 (ref 9–23)
BUN: 14 mg/dL (ref 6–20)
Bilirubin Total: 0.9 mg/dL (ref 0.0–1.2)
CO2: 24 mmol/L (ref 20–29)
Calcium: 9.9 mg/dL (ref 8.7–10.2)
Chloride: 103 mmol/L (ref 96–106)
Creatinine, Ser: 1.09 mg/dL — ABNORMAL HIGH (ref 0.57–1.00)
Globulin, Total: 2.5 g/dL (ref 1.5–4.5)
Glucose: 99 mg/dL (ref 70–99)
Potassium: 4.6 mmol/L (ref 3.5–5.2)
Sodium: 139 mmol/L (ref 134–144)
Total Protein: 7.2 g/dL (ref 6.0–8.5)
eGFR: 67 mL/min/{1.73_m2} (ref >59)

## 2023-02-06 LAB — LIPID PANEL
Chol/HDL Ratio: 3.6 ratio (ref 0.0–4.4)
Cholesterol, Total: 208 mg/dL — ABNORMAL HIGH (ref 100–199)
HDL: 58 mg/dL (ref >39)
LDL Chol Calc (NIH): 128 mg/dL — ABNORMAL HIGH (ref 0–99)
Triglycerides: 122 mg/dL (ref 0–149)
VLDL Cholesterol Cal: 22 mg/dL (ref 5–40)

## 2023-02-06 LAB — HEPATITIS C ANTIBODY: Hep C Virus Ab: NONREACTIVE

## 2023-02-06 LAB — HEMOGLOBIN A1C
Est. average glucose Bld gHb Est-mCnc: 120 mg/dL
Hgb A1c MFr Bld: 5.8 % — ABNORMAL HIGH (ref 4.8–5.6)

## 2023-02-06 LAB — TSH: TSH: 1.41 u[IU]/mL (ref 0.450–4.500)

## 2023-02-07 ENCOUNTER — Other Ambulatory Visit (HOSPITAL_COMMUNITY): Payer: Self-pay

## 2023-02-07 NOTE — Addendum Note (Signed)
Addended by: Genice Kimberlin, Hunt[place] Dec E on: 02/07/2023 07:59 AM   Modules accepted: Orders

## 2023-02-14 ENCOUNTER — Encounter: Payer: Self-pay | Admitting: Nurse Practitioner

## 2023-02-21 ENCOUNTER — Other Ambulatory Visit: Payer: BC Managed Care – PPO

## 2023-02-21 DIAGNOSIS — R7989 Other specified abnormal findings of blood chemistry: Secondary | ICD-10-CM

## 2023-02-22 LAB — BASIC METABOLIC PANEL
BUN/Creatinine Ratio: 12 (ref 9–23)
BUN: 13 mg/dL (ref 6–20)
CO2: 25 mmol/L (ref 20–29)
Calcium: 9.6 mg/dL (ref 8.7–10.2)
Chloride: 102 mmol/L (ref 96–106)
Creatinine, Ser: 1.1 mg/dL — ABNORMAL HIGH (ref 0.57–1.00)
Glucose: 97 mg/dL (ref 70–99)
Potassium: 4.2 mmol/L (ref 3.5–5.2)
Sodium: 141 mmol/L (ref 134–144)
eGFR: 66 mL/min/{1.73_m2} (ref >59)

## 2023-02-26 ENCOUNTER — Other Ambulatory Visit (HOSPITAL_COMMUNITY): Payer: Self-pay

## 2023-02-26 ENCOUNTER — Encounter: Payer: Self-pay | Admitting: Obstetrics and Gynecology

## 2023-02-26 ENCOUNTER — Ambulatory Visit: Payer: BC Managed Care – PPO | Admitting: Obstetrics and Gynecology

## 2023-02-26 ENCOUNTER — Ambulatory Visit: Payer: BC Managed Care – PPO | Admitting: Nurse Practitioner

## 2023-02-26 VITALS — BP 128/80 | HR 67 | Ht 67.0 in | Wt 185.0 lb

## 2023-02-26 DIAGNOSIS — N764 Abscess of vulva: Secondary | ICD-10-CM

## 2023-02-26 MED ORDER — FLUCONAZOLE 150 MG PO TABS
150.0000 mg | ORAL_TABLET | Freq: Once | ORAL | 0 refills | Status: AC
Start: 2023-02-26 — End: 2023-02-27
  Filled 2023-02-26: qty 2, 3d supply, fill #0

## 2023-02-26 MED ORDER — SULFAMETHOXAZOLE-TRIMETHOPRIM 800-160 MG PO TABS
1.0000 | ORAL_TABLET | Freq: Two times a day (BID) | ORAL | 0 refills | Status: AC
Start: 2023-02-26 — End: 2023-03-05
  Filled 2023-02-26: qty 14, 7d supply, fill #0

## 2023-02-26 NOTE — Progress Notes (Signed)
GYNECOLOGY  VISIT   HPI: 37 y.o.   Married  [place]  female   [phone] with Patient's last menstrual period was 11/29/2015.   here for   vaginal bump. Bump is in the pubic area. Feels the top of her underwear rubs it. Popped in August, leaks and blood and pus. It has gotten smaller and it still hurts.  States she had a 2 inch lump on her mons pubis in July. It drained in August.  Using Neosporin.  It is painful.  She does not have the boils anywhere else.   No hx of MRSA.   Stopped shaving.  Does short clip only.   Has prediabetes.  A1C 02/05/23 is 5.8.   HIV 08/08/22 negative.    Mother of 4 boys.  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  GYNECOLOGIC HISTORY: Patient's last menstrual period was 11/29/2015. 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    Class: Chronic   Status post lumbar laminectomy 11/12/2021   Social anxiety disorder 06/16/2019   Prediabetes 12/02/2018   Mild persistent asthma without complication 12/02/2018   Migraines 03/23/2015   Right-sided low back pain without sciatica 03/23/2015    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   Cough 05/31/2020   COVID-19 05/24/2020   Dysmenorrhea 04/08/2013    Endometriosis 04/08/2013   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   Great toe pain, left 06/16/2019   H/O abdominal supracervical subtotal hysterectomy 12/01/2019   H/O: hysterectomy 10/2019   Hemorrhage in uterus 01/20/2009   History of COVID-19 05/31/2020   History of gestational diabetes 12/02/2018   Hypertension    Migraines    Muscle cramps 03/23/2015   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   Postoperative state 12/01/2019   Pre-diabetes    Recurrent upper respiratory infection (URI)    Seasonal allergies    Sickle cell anemia (HCC)    trait   Sickle cell trait (HCC)     Past Surgical History:  Procedure Laterality Date   ABDOMINAL HYSTERECTOMY     CESAREAN SECTION  09/28/2005   CESAREAN SECTION  01/20/2009   CESAREAN SECTION     DIAGNOSTIC LAPAROSCOPY     ENDOMETRIAL ABLATION  10/2011   HEROPTIOIN   HERNIA REPAIR     bilateral groins as a child   KNEE ARTHROSCOPY Right 02/16/2014  Procedure: RIGHT ARTHROSCOPY KNEE;  Surgeon: Nestor Lewandowsky, MD;  Location: [facility];  Service: Orthopedics;  Laterality: Right;   LUMBAR LAMINECTOMY/DECOMPRESSION MICRODISCECTOMY N/A 11/12/2021   Procedure: BILATERAL LUMBAR FIVE - SACRAL ONE MICRODISCECTOMY;  Surgeon: Kerrin Champagne, MD;  Location: [facility] OR;  Service: Orthopedics;  Laterality: N/A;   POLYP REMOVAL  09/2006   ?CERVICAL OR UTERINE POLYP   ROBOTIC ASSISTED LAPAROSCOPIC HYSTERECTOMY AND SALPINGECTOMY Bilateral 12/01/2019   Procedure: XI ROBOTIC ASSISTEDTOTAL TOTAL LAPAROSCOPIC HYSTERECTOMY AND RIGHT SALPINGECTOMY , EXTENSIVE LYSIS OF ADHESIONS;  Surgeon: Genia Del, MD;  Location: [facility] [place];  Service: Gynecology;  Laterality: Bilateral;  request 8:30am OR time. Requests 2 hours   TUBAL LIGATION  01/20/2009   DONE WITH C-SECTION   WISDOM TOOTH EXTRACTION Right 2014    Current Outpatient Medications  Medication Sig Dispense Refill   albuterol (VENTOLIN HFA) 108 (90 Base)  MCG/ACT inhaler Inhale 4 puffs into the lungs every 4-6 hours as needed 8.5 g 1   Albuterol-Budesonide (AIRSUPRA) 90-80 MCG/ACT AERO Inhale 2 Inhalations into the lungs every 6 (six) hours as needed.     ALVESCO 160 MCG/ACT inhaler Inhale 1 puff into the lungs 2 (two) times daily. 6.1 g 5   Bacillus Coagulans-Inulin (PROBIOTIC-PREBIOTIC PO) Take 1 capsule by mouth daily.     beclomethasone (QVAR) 80 MCG/ACT inhaler Inhale 1 puff into the lungs 2 (two) times daily.     BIOTIN PO Take 1 tablet by mouth daily.     cetirizine (ZYRTEC) 10 MG tablet Take 1 tablet by mouth daily. 30 tablet 5   Cholecalciferol (D 5000) 125 MCG (5000 UT) capsule Take 1 capsule (5,000 Units total) by mouth daily. 30 capsule 3   Cholecalciferol (D 5000) 125 MCG (5000 UT) capsule Take 1 capsule (5,000 Units total) by mouth daily. 30 capsule 3   docusate sodium (COLACE) 100 MG capsule Take 1 capsule (100 mg total) by mouth 2 (two) times daily. 10 capsule 0   gabapentin (NEURONTIN) 400 MG capsule Take 1 capsule (400 mg total) by mouth 2 (two) times daily. 60 capsule 0   Lidocaine, Anorectal, 5 % CREA Apply to affected skin 3 times per day as needed for pain 90 g 0   metroNIDAZOLE (FLAGYL) 500 MG tablet Take 1 tablet (500 mg total) by mouth 2 (two) times daily. 14 tablet 0   Multiple Vitamin (MULTIVITAMIN WITH MINERALS) TABS tablet Take 1 tablet by mouth daily.     naproxen (NAPROSYN) 500 MG tablet Take 1 tablet (500 mg total) by mouth every 12 (twelve) hours as needed. 60 tablet 1   propranolol ER (INDERAL LA) 120 MG 24 hr capsule Take 1 capsule (120 mg total) by mouth at bedtime. 90 capsule 3   sertraline (ZOLOFT) 50 MG tablet Take 1 tablet (50 mg total) by mouth daily. Start with 1/2 tablet once a day for 4 days then go to 1 tablet. 30 tablet 3   simethicone (MYLICON) 125 MG chewable tablet Chew 125 mg by mouth in the morning and at bedtime.     traMADol (ULTRAM) 50 MG tablet Take 2 tablets (100 mg total) by mouth 2 (two)  times daily as needed. 120 tablet 0   SUMAtriptan 6 MG/0.5ML SOAJ INJECT 6 MG INTO THE SKIN AS NEEDED (MAY REPEAT IN ONE HOUR. MAXIMUM 2 INJECTIONS IN 24 HOURS.). 3 mL 5   No current facility-administered medications for this visit.     ALLERGIES: Dewayne Hatch [elagolix]  Family History  Problem Relation Age of Onset   Fibromyalgia Mother    Multiple sclerosis Mother    Asthma Father    Healthy Maternal Grandmother    Healthy Maternal Grandfather    Healthy Paternal Grandmother    Healthy Paternal Grandfather    Endometriosis Sister    Healthy Brother    Healthy Child     Social History   Socioeconomic History   Marital status: Married    Spouse name: Not on file   Number of children: 4   Years of education: 14   Highest education level: Associate degree: occupational, Scientist, product/process development, or vocational program  Occupational History   Occupation: NT/Transporter  Tobacco Use   Smoking status: Never   Smokeless tobacco: Never  Vaping Use   Vaping status: Never Used  Substance and Sexual Activity   Alcohol use: Yes    Alcohol/week: 0.0 standard drinks of alcohol    Comment: seldom   Drug use: No   Sexual activity: Yes    Birth control/protection: Surgical    Comment: Hyst, First IC @ 37 y/o  Other Topics Concern   Not on file  Social History Narrative   Fun: Baking, playing with the children.   Denies abuse and feels safe at home.       Pt does not drink coffee, tea or soda   Pt lives with spouse and 4 children   Pt is right handed   She has a associates degree   One story home       Social Determinants of Health   Financial Resource Strain: Low Risk  (02/05/2023)   Overall Financial Resource Strain (CARDIA)    Difficulty of Paying Living Expenses: Not very hard  Food Insecurity: No Food Insecurity (02/05/2023)   Hunger Vital Sign    Worried About Running Out of Food in the Last Year: Never true    Ran Out of Food in the Last Year: Never true  Recent Concern: Food  Insecurity - Food Insecurity Present (01/29/2023)   Hunger Vital Sign    Worried About Running Out of Food in the Last Year: Never true    Ran Out of Food in the Last Year: Sometimes true  Transportation Needs: No Transportation Needs (02/05/2023)   PRAPARE - Administrator, Civil Service (Medical): No    Lack of Transportation (Non-Medical): No  Physical Activity: Insufficiently Active (02/05/2023)   Exercise Vital Sign    Days of Exercise per Week: 2 days    Minutes of Exercise per Session: 20 min  Stress: No Stress Concern Present (02/05/2023)   [employer] of Occupational Health - Occupational Stress Questionnaire    Feeling of Stress : Only a little  Recent Concern: Stress - Stress Concern Present (01/29/2023)   [employer] of Occupational Health - Occupational Stress Questionnaire    Feeling of Stress : To some extent  Social Connections: Socially Integrated (02/05/2023)   Social Connection and Isolation Panel [NHANES]    Frequency of Communication with Friends and Family: Three times a week    Frequency of Social Gatherings with Friends and Family: Twice a week    Attends Religious Services: 1 to 4 times per year    Active Member of [employer] or Organizations: No    Attends Banker Meetings: 1 to 4 times per year    Marital Status: Married  Catering manager Violence: Not At Risk (02/05/2023)   Humiliation, Afraid, Rape, and Kick questionnaire    Fear of Current  or Ex-Partner: No    Emotionally Abused: No    Physically Abused: No    Sexually Abused: No    Review of Systems  All other systems reviewed and are negative.   PHYSICAL EXAMINATION:    Ht 5\' 7"  (1.702 m)   Wt 185 lb (83.9 kg)   LMP 11/29/2015   BMI 28.98 kg/m     General appearance: alert, cooperative and appears stated age   Pelvic: External genitalia:  left mons pubis with boil, 1 cm firm area, nontender, not draining.               Urethra:  normal appearing urethra with no  masses, tenderness or lesions              Bartholins and Skenes: normal                 Vagina: normal appearing vagina with normal color and discharge, no lesions              Cervix: absent                Bimanual Exam:  Uterus:  absent              Adnexa: no mass, fullness, tenderness         Chaperone was present for exam:  Silas Flood.  ASSESSMENT  Vulvar boil.  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  Not drainable today.   PLAN  No wound culture possible.   Bactrim DS po bid x 7 days to cover potential MRSA.   Do not place Neosporin on the area.  Return for persistent or worsening of symptoms.   20 min  total time was spent for this patient encounter, including preparation, face-to-face counseling with the patient, coordination of care, and documentation of the encounter.

## 2023-03-01 ENCOUNTER — Other Ambulatory Visit (HOSPITAL_COMMUNITY): Payer: Self-pay

## 2023-03-01 MED ORDER — D 5000 125 MCG (5000 UT) PO CAPS
ORAL_CAPSULE | ORAL | 3 refills | Status: AC
  Filled 2023-03-01: qty 30, 30d supply, fill #0
  Filled 2023-04-01: qty 30, 30d supply, fill #1
  Filled 2023-05-02: qty 30, 30d supply, fill #2
  Filled 2023-06-09 (×2): qty 30, 30d supply, fill #3

## 2023-03-01 MED ORDER — NALOXONE HCL 4 MG/0.1ML NA LIQD
NASAL | 3 refills | Status: DC
  Filled 2023-03-01: qty 2, 1d supply, fill #0
  Filled 2023-09-05: qty 2, 30d supply, fill #0

## 2023-03-01 MED ORDER — TRAMADOL HCL 50 MG PO TABS
100.0000 mg | ORAL_TABLET | Freq: Two times a day (BID) | ORAL | 0 refills | Status: DC | PRN
  Filled 2023-03-01 – 2023-03-07 (×2): qty 120, 30d supply, fill #0

## 2023-03-01 MED ORDER — GABAPENTIN 400 MG PO CAPS
400.0000 mg | ORAL_CAPSULE | Freq: Two times a day (BID) | ORAL | 0 refills | Status: DC
  Filled 2023-03-01 – 2023-03-02 (×2): qty 60, 30d supply, fill #0

## 2023-03-03 ENCOUNTER — Other Ambulatory Visit (HOSPITAL_COMMUNITY): Payer: Self-pay

## 2023-03-04 ENCOUNTER — Other Ambulatory Visit: Payer: Self-pay

## 2023-03-05 ENCOUNTER — Other Ambulatory Visit (HOSPITAL_COMMUNITY): Payer: Self-pay

## 2023-03-07 ENCOUNTER — Encounter: Payer: Self-pay | Admitting: Nurse Practitioner

## 2023-03-07 ENCOUNTER — Other Ambulatory Visit: Payer: Self-pay

## 2023-03-07 ENCOUNTER — Other Ambulatory Visit (HOSPITAL_COMMUNITY): Payer: Self-pay

## 2023-03-07 ENCOUNTER — Other Ambulatory Visit: Payer: Self-pay | Admitting: Nurse Practitioner

## 2023-03-07 DIAGNOSIS — L0291 Cutaneous abscess, unspecified: Secondary | ICD-10-CM

## 2023-03-07 DIAGNOSIS — R7989 Other specified abnormal findings of blood chemistry: Secondary | ICD-10-CM

## 2023-03-07 MED ORDER — DOXYCYCLINE HYCLATE 100 MG PO TABS
100.0000 mg | ORAL_TABLET | Freq: Two times a day (BID) | ORAL | 0 refills | Status: AC
Start: 2023-03-07 — End: 2023-03-15
  Filled 2023-03-07: qty 14, 7d supply, fill #0

## 2023-03-08 ENCOUNTER — Other Ambulatory Visit (HOSPITAL_COMMUNITY): Payer: Self-pay

## 2023-03-10 ENCOUNTER — Other Ambulatory Visit: Payer: Self-pay

## 2023-03-19 ENCOUNTER — Telehealth (INDEPENDENT_AMBULATORY_CARE_PROVIDER_SITE_OTHER): Payer: BC Managed Care – PPO | Admitting: Nurse Practitioner

## 2023-03-19 ENCOUNTER — Encounter: Payer: Self-pay | Admitting: Nurse Practitioner

## 2023-03-19 VITALS — Ht 67.0 in | Wt 184.0 lb

## 2023-03-19 DIAGNOSIS — F4010 Social phobia, unspecified: Secondary | ICD-10-CM | POA: Diagnosis not present

## 2023-03-19 DIAGNOSIS — R7989 Other specified abnormal findings of blood chemistry: Secondary | ICD-10-CM

## 2023-03-19 DIAGNOSIS — R7303 Prediabetes: Secondary | ICD-10-CM

## 2023-03-19 DIAGNOSIS — R400 Somnolence: Secondary | ICD-10-CM | POA: Diagnosis not present

## 2023-03-19 NOTE — Progress Notes (Signed)
Virtual Visit Encounter mychart visit.   I connected with  Penny Thomas on 03/25/23 at 11:30 AM EDT by secure video and audio telemedicine application. I verified that I am speaking with the correct person using two identifiers.   I introduced myself as a Publishing rights manager with the practice. The limitations of evaluation and management by telemedicine discussed with the patient and the availability of in person appointments. The patient expressed verbal understanding and consent to proceed.  Participating parties in this visit include: Myself and patient  The patient is: Patient Location: Home I am: Provider Location: Office/Clinic Subjective:    CC and HPI: Penny Thomas is a 37 y.o. year old female presenting for follow up of new start of sertraline. Patient reports the following:  History of Present Illness The patient, who has been recently started on sertraline, reports a slight improvement in her anxiety symptoms. She notes a decrease in the frequency of feeling her heartbeat, which now only occurs occasionally and seems to be influenced by her environment and company. However, she has been experiencing an unusual pattern of sleepiness. She takes her medication at night and functions well during her work hours, but often experiences a significant crash in the Marissa Weaver afternoon, leading to long periods of sleep. This pattern is not consistent and occurs two to three days a week. The patient is unsure if this is a side effect of the medication or due to a potential deficiency, such as low iron levels. She has also noticed that this pattern does not seem to be influenced by her meal times or content. Despite these concerns, the patient wishes to maintain her current dosage of sertraline as she has noticed a positive impact on her anxiety symptoms.  Past medical history, Surgical history, Family history not pertinant except as noted below, Social history, Allergies, and  medications have been entered into the medical record, reviewed, and corrections made.   Review of Systems:  All review of systems negative except what is listed in the HPI  Objective:    Alert and oriented x 4 Speaking in clear sentences with no shortness of breath. No distress.  Impression and Recommendations:    Problem List Items Addressed This Visit     Social anxiety disorder - Primary    Improvement noted with Sertraline. No side effects reported. Patient comfortable with current dose. -Continue Sertraline at current dose. -Consider dose adjustment if symptoms are not manageable in the future.      Daytime sleepiness    Patient reports excessive sleepiness in the afternoon on some days. Unclear etiology. Possible dietary influence or low iron levels. -Order labs to check B12 and iron levels. -Advise patient to have a balanced meal with protein and carbs at lunchtime and observe if symptoms improve.       orders and follow up as documented in EMR I discussed the assessment and treatment plan with the patient. The patient was provided an opportunity to ask questions and all were answered. The patient agreed with the plan and demonstrated an understanding of the instructions.   The patient was advised to call back or seek an in-person evaluation if the symptoms worsen or if the condition fails to improve as anticipated.  Follow-Up: after labs and/or imaging, consults, etc. have been completed  I provided 21 minutes of non-face-to-face interaction with this non face-to-face encounter including intake, same-day documentation, and chart review.   Tollie Eth, NP , DNP, AGNP-c [facility]  Family Medicine

## 2023-03-20 ENCOUNTER — Other Ambulatory Visit: Payer: Self-pay | Admitting: Medical Genetics

## 2023-03-20 DIAGNOSIS — Z006 Encounter for examination for normal comparison and control in clinical research program: Secondary | ICD-10-CM

## 2023-03-25 DIAGNOSIS — R400 Somnolence: Secondary | ICD-10-CM | POA: Insufficient documentation

## 2023-03-25 NOTE — Assessment & Plan Note (Signed)
Improvement noted with Sertraline. No side effects reported. Patient comfortable with current dose. -Continue Sertraline at current dose. -Consider dose adjustment if symptoms are not manageable in the future.

## 2023-03-25 NOTE — Assessment & Plan Note (Signed)
Patient reports excessive sleepiness in the afternoon on some days. Unclear etiology. Possible dietary influence or low iron levels. -Order labs to check B12 and iron levels. -Advise patient to have a balanced meal with protein and carbs at lunchtime and observe if symptoms improve.

## 2023-03-31 ENCOUNTER — Other Ambulatory Visit (HOSPITAL_COMMUNITY): Payer: BC Managed Care – PPO

## 2023-04-01 ENCOUNTER — Other Ambulatory Visit (HOSPITAL_COMMUNITY): Payer: Self-pay

## 2023-04-01 MED ORDER — TRAMADOL HCL 50 MG PO TABS
100.0000 mg | ORAL_TABLET | Freq: Two times a day (BID) | ORAL | 0 refills | Status: DC | PRN
  Filled 2023-05-08: qty 120, 30d supply, fill #0

## 2023-04-01 MED ORDER — NALOXONE HCL 4 MG/0.1ML NA LIQD
NASAL | 3 refills | Status: DC
  Filled 2023-04-01: qty 2, 1d supply, fill #0

## 2023-04-01 MED ORDER — GABAPENTIN 400 MG PO CAPS
400.0000 mg | ORAL_CAPSULE | Freq: Two times a day (BID) | ORAL | 0 refills | Status: DC
  Filled 2023-04-04: qty 60, 30d supply, fill #0

## 2023-04-02 ENCOUNTER — Other Ambulatory Visit: Payer: Self-pay

## 2023-04-02 ENCOUNTER — Other Ambulatory Visit (HOSPITAL_COMMUNITY): Payer: Self-pay

## 2023-04-04 ENCOUNTER — Other Ambulatory Visit: Payer: Self-pay

## 2023-04-04 ENCOUNTER — Other Ambulatory Visit (HOSPITAL_COMMUNITY): Payer: Self-pay

## 2023-04-04 ENCOUNTER — Other Ambulatory Visit (HOSPITAL_COMMUNITY)
Admission: RE | Admit: 2023-04-04 | Discharge: 2023-04-04 | Disposition: A | Discharge status: Home / Self Care | Payer: BC Managed Care – PPO | Source: Ambulatory Visit | Attending: Oncology | Admitting: Oncology

## 2023-04-04 DIAGNOSIS — Z006 Encounter for examination for normal comparison and control in clinical research program: Secondary | ICD-10-CM | POA: Insufficient documentation

## 2023-04-07 ENCOUNTER — Other Ambulatory Visit (HOSPITAL_COMMUNITY): Payer: Self-pay

## 2023-04-09 ENCOUNTER — Encounter: Payer: Self-pay | Admitting: Nurse Practitioner

## 2023-04-10 ENCOUNTER — Other Ambulatory Visit: Payer: Self-pay

## 2023-04-14 ENCOUNTER — Other Ambulatory Visit: Payer: Self-pay | Admitting: Medical Genetics

## 2023-04-14 DIAGNOSIS — Z006 Encounter for examination for normal comparison and control in clinical research program: Secondary | ICD-10-CM

## 2023-04-14 NOTE — Progress Notes (Signed)
 New order requested. Confirmed consent is on file.

## 2023-04-15 ENCOUNTER — Other Ambulatory Visit: Payer: BC Managed Care – PPO

## 2023-04-15 DIAGNOSIS — F4010 Social phobia, unspecified: Secondary | ICD-10-CM

## 2023-04-15 DIAGNOSIS — R7303 Prediabetes: Secondary | ICD-10-CM

## 2023-04-15 DIAGNOSIS — R400 Somnolence: Secondary | ICD-10-CM

## 2023-04-15 DIAGNOSIS — R7989 Other specified abnormal findings of blood chemistry: Secondary | ICD-10-CM

## 2023-04-16 LAB — IRON,TIBC AND FERRITIN PANEL
Ferritin: 132 ng/mL (ref 15–150)
Iron Saturation: 22 % (ref 15–55)
Iron: 67 ug/dL (ref 27–159)
Total Iron Binding Capacity: 308 ug/dL (ref 250–450)
UIBC: 241 ug/dL (ref 131–425)

## 2023-04-16 LAB — VITAMIN B12: Vitamin B-12: 695 pg/mL (ref 232–1245)

## 2023-04-16 LAB — COMPREHENSIVE METABOLIC PANEL
ALT: 13 [IU]/L (ref 0–32)
AST: 22 [IU]/L (ref 0–40)
Albumin: 4.5 g/dL (ref 3.9–4.9)
Alkaline Phosphatase: 57 [IU]/L (ref 44–121)
BUN/Creatinine Ratio: 10 (ref 9–23)
BUN: 11 mg/dL (ref 6–20)
Bilirubin Total: 0.6 mg/dL (ref 0.0–1.2)
CO2: 23 mmol/L (ref 20–29)
Calcium: 9.2 mg/dL (ref 8.7–10.2)
Chloride: 102 mmol/L (ref 96–106)
Creatinine, Ser: 1.05 mg/dL — ABNORMAL HIGH (ref 0.57–1.00)
Globulin, Total: 2.5 g/dL (ref 1.5–4.5)
Glucose: 92 mg/dL (ref 70–99)
Potassium: 4.6 mmol/L (ref 3.5–5.2)
Sodium: 139 mmol/L (ref 134–144)
Total Protein: 7 g/dL (ref 6.0–8.5)
eGFR: 70 mL/min/{1.73_m2} (ref >59)

## 2023-04-22 LAB — GENECONNECT MOLECULAR SCREEN: Genetic Analysis Overall Interpretation: NEGATIVE

## 2023-05-02 ENCOUNTER — Other Ambulatory Visit (HOSPITAL_COMMUNITY): Payer: Self-pay

## 2023-05-08 ENCOUNTER — Other Ambulatory Visit: Payer: Self-pay

## 2023-05-08 ENCOUNTER — Other Ambulatory Visit (HOSPITAL_COMMUNITY): Payer: BC Managed Care – PPO

## 2023-05-08 ENCOUNTER — Other Ambulatory Visit (HOSPITAL_COMMUNITY): Payer: Self-pay

## 2023-05-09 ENCOUNTER — Other Ambulatory Visit (HOSPITAL_COMMUNITY): Payer: Self-pay

## 2023-05-10 ENCOUNTER — Other Ambulatory Visit (HOSPITAL_COMMUNITY): Payer: Self-pay

## 2023-05-10 MED ORDER — GABAPENTIN 400 MG PO CAPS
400.0000 mg | ORAL_CAPSULE | Freq: Two times a day (BID) | ORAL | 0 refills | Status: DC
  Filled 2023-05-10: qty 60, 30d supply, fill #0

## 2023-05-10 MED ORDER — TRAMADOL HCL 50 MG PO TABS
100.0000 mg | ORAL_TABLET | Freq: Two times a day (BID) | ORAL | 0 refills | Status: DC | PRN
  Filled 2023-05-10: qty 120, 30d supply, fill #0

## 2023-05-14 ENCOUNTER — Encounter: Payer: Self-pay | Admitting: Family Medicine

## 2023-05-14 ENCOUNTER — Encounter: Payer: Self-pay | Admitting: Nurse Practitioner

## 2023-05-14 ENCOUNTER — Other Ambulatory Visit (HOSPITAL_COMMUNITY): Payer: Self-pay

## 2023-05-14 ENCOUNTER — Telehealth: Payer: BC Managed Care – PPO | Admitting: Family Medicine

## 2023-05-14 VITALS — Ht 67.0 in | Wt 184.0 lb

## 2023-05-14 DIAGNOSIS — J4530 Mild persistent asthma, uncomplicated: Secondary | ICD-10-CM | POA: Diagnosis not present

## 2023-05-14 DIAGNOSIS — Z91148 Patient's other noncompliance with medication regimen for other reason: Secondary | ICD-10-CM | POA: Diagnosis not present

## 2023-05-14 DIAGNOSIS — R058 Other specified cough: Secondary | ICD-10-CM | POA: Diagnosis not present

## 2023-05-14 MED ORDER — AMOXICILLIN 500 MG PO CAPS
1000.0000 mg | ORAL_CAPSULE | Freq: Two times a day (BID) | ORAL | 0 refills | Status: DC
  Filled 2023-05-14: qty 40, 10d supply, fill #0

## 2023-05-14 NOTE — Patient Instructions (Signed)
Stay well hydrated. Use the AirSupra as needed for any shortness of breath, wheezing/cough. Be sure to rinse your mouth out. STOP taking Dayquil and 12 hour sudafed. ONLY take the Mucinex-D 12 hour, and take that twice daily. Take Delsym syrup every 12 hours as a cough suppressant. You may use tylenol or ibuprofen if needed for pain or fever.  If your COVID test is positive, then this is a virus, and won't benefit from antibiotics. It is too late to consider any antiviral medications. If your COVID test is negative, then start the prescribed antibiotics.  If you aren't improving within the next 5-7 days please let us know. If you develop a fever, worsening shortness of breath, pain with breathing, we may want to see you in the office.

## 2023-05-14 NOTE — Progress Notes (Signed)
Start time: 4:08 End time: 4:26  Virtual Visit via Video Note  I connected with Penny Thomas on 05/14/23 by a video enabled telemedicine application and verified that I am speaking with the correct person using two identifiers.  Location: Patient: home, in bed Provider: office   I discussed the limitations of evaluation and management by telemedicine and the availability of in person appointments. The patient expressed understanding and agreed to proceed.  History of Present Illness:  Chief Complaint  Patient presents with   sinus drainage    VIRTUAL sinus drainage, coughing up green mucus since last Monday. Drainage started last Thursday. Has not done any home covid tests.    Thursday 12/12, she started with headache, fatigue, thought it was her allergies.  12/13 she lost her appetite, lost her voice. Having stuffy nose, nasal congestion. Monday 12/16 she started coughing up green stuff. Nasal drainage is clear. Denies sinus pain, ear pain.  Sore throat resolved by Monday.  Cough--keeps her awake at night, coughs throughout the day. Coughs until she gets up phlegm.  Phlegm changed from clear to white, but this week she is always coughing up green phlegm. Having some shortness of breath, but hasn't used any inhaler, was afraid how it would make her feel. AirSupra--she just used it when she first got sick, not since.  This morning she took 12 hour sudafed, Mucinex-D, and Dayquil. Took all of these this morning didn't help.   PMH, PSH, SH reviewed  Outpatient Encounter Medications as of 05/14/2023  Medication Sig Note   Bacillus Coagulans-Inulin (PROBIOTIC-PREBIOTIC PO) Take 1 capsule by mouth daily.    BIOTIN PO Take 1 tablet by mouth daily.    cetirizine (ZYRTEC) 10 MG tablet Take 1 tablet by mouth daily.    Cholecalciferol (D 5000) 125 MCG (5000 UT) capsule Take 1 capsule by mouth daily    gabapentin (NEURONTIN) 400 MG capsule Take 1 capsule (400 mg total) by  mouth daily.    Multiple Vitamin (MULTIVITAMIN WITH MINERALS) TABS tablet Take 1 tablet by mouth daily.    propranolol ER (INDERAL LA) 120 MG 24 hr capsule Take 1 capsule (120 mg total) by mouth at bedtime.    pseudoephedrine (SUDAFED) 120 MG 12 hr tablet Take 120 mg by mouth 2 (two) times daily. 05/14/2023: Last dose 5:45am   Pseudoephedrine-APAP-DM (DAYQUIL PO) Take 30 mLs by mouth as needed. 05/14/2023: Took at 5:45am    pseudoephedrine-guaifenesin (MUCINEX D) 60-600 MG 12 hr tablet Take 1 tablet by mouth every 12 (twelve) hours. 05/14/2023: Took at 5:45am   sertraline (ZOLOFT) 50 MG tablet Take 1 tablet (50 mg total) by mouth daily. Start with 1/2 tablet once a day for 4 days then go to 1 tablet.    traMADol (ULTRAM) 50 MG tablet 2 tablet by mouth two times daily, as needed 05/14/2023: Took 2 last night   albuterol (VENTOLIN HFA) 108 (90 Base) MCG/ACT inhaler Inhale 4 puffs into the lungs every 4-6 hours as needed (Patient not taking: Reported on 03/19/2023) 05/14/2023: As needed   Albuterol-Budesonide (AIRSUPRA) 90-80 MCG/ACT AERO Inhale 2 Inhalations into the lungs every 6 (six) hours as needed. (Patient not taking: Reported on 05/14/2023) 05/14/2023: Has not needed.   ALVESCO 160 MCG/ACT inhaler Inhale 1 puff into the lungs 2 (two) times daily. (Patient not taking: Reported on 03/19/2023) 05/14/2023: As needed   beclomethasone (QVAR) 80 MCG/ACT inhaler Inhale 1 puff into the lungs 2 (two) times daily. (Patient not taking: Reported on 03/19/2023) 05/14/2023: As  needed   Lidocaine, Anorectal, 5 % CREA Apply to affected skin 3 times per day as needed for pain (Patient not taking: Reported on 05/14/2023) 03/19/2023: As needed   naloxone [facility]) nasal spray 4 mg/0.1 mL Instill 1 spray into nose as directed for accidental overdose (Patient not taking: Reported on 05/14/2023)    naloxone [facility]) nasal spray 4 mg/0.1 mL Use 1 (one) spray for accidental overdose (Patient not taking: Reported on  05/14/2023)    naproxen (NAPROSYN) 500 MG tablet Take 1 tablet (500 mg total) by mouth every 12 (twelve) hours as needed. (Patient not taking: Reported on 05/14/2023) 05/14/2023: As needed   simethicone (MYLICON) 125 MG chewable tablet Chew 125 mg by mouth in the morning and at bedtime. (Patient not taking: Reported on 05/14/2023) 05/14/2023: As needed   SUMAtriptan 6 MG/0.5ML SOAJ INJECT 6 MG INTO THE SKIN AS NEEDED (MAY REPEAT IN ONE HOUR. MAXIMUM 2 INJECTIONS IN 24 HOURS.). (Patient not taking: Reported on 05/14/2023) 05/14/2023: As needed   traMADol (ULTRAM) 50 MG tablet Take 2 tablets (100 mg total) by mouth 2 (two) times daily as needed. (Patient not taking: Reported on 05/14/2023)    traMADol (ULTRAM) 50 MG tablet Take 2 tablets (100 mg total) by mouth 2 (two) times daily as needed. (Patient not taking: Reported on 05/14/2023) 03/19/2023: Took 50mg  this am   traMADol (ULTRAM) 50 MG tablet Take 2 tablets (100 mg total) by mouth 2 (two) times daily as needed. (Patient not taking: Reported on 05/14/2023)    [DISCONTINUED] Cholecalciferol (D 5000) 125 MCG (5000 UT) capsule Take 1 capsule (5,000 Units total) by mouth daily. (Patient not taking: Reported on 03/19/2023)    [DISCONTINUED] Cholecalciferol (D 5000) 125 MCG (5000 UT) capsule Take 1 capsule (5,000 Units total) by mouth daily. (Patient not taking: Reported on 03/19/2023)    [DISCONTINUED] docusate sodium (COLACE) 100 MG capsule Take 1 capsule (100 mg total) by mouth 2 (two) times daily.    [DISCONTINUED] esomeprazole (NEXIUM) 40 MG capsule TAKE 1 CAPSULE BY MOUTH ONCE A DAY AT 12 NOON    No facility-administered encounter medications on file as of 05/14/2023.    Allergies  Allergen Reactions   [name] [Elagolix] Other (See Comments)    Hallucinations   ROS:  URI symptoms per HPI.  No n/v/d, chest pain. Some shortness of breath.  Lost voice. No known fever.  Observations/Objective:  Ht 5\' 7"  (1.702 m)   Wt 184 lb (83.5 kg)   LMP  11/29/2015   BMI 28.82 kg/m   Very squeaky voice, hard to understand, possible due to mild laryngitis She is speaking comfortably, in no distress. Exam is limited by virtual nature of the visit.   Assessment and Plan:  Cough productive of purulent sputum - Worsening, so will treat with Amox in addition to conservative measures if COVID test is negative (will do at home today) - Plan: amoxicillin (AMOXIL) 500 MG capsule  Overuse of medication - taking 3 decongestants together. Risks reviewed, and counseled re: OTC meds in detail  Mild persistent asthma without complication - encouraged use of AirSupra prn  Stay well hydrated. Use the AirSupra as needed for any shortness of breath, wheezing/cough. Be sure to rinse your mouth out. STOP taking Dayquil and 12 hour sudafed. ONLY take the Mucinex-D 12 hour, and take that twice daily. Take Delsym syrup every 12 hours as a cough suppressant. You may use tylenol or ibuprofen if needed for pain or fever.  If your COVID test is positive,  then this is a virus, and won't benefit from antibiotics. It is too late to consider any antiviral medications. If your COVID test is negative, then start the prescribed antibiotics.  If you aren't improving within the next 5-7 days please let us know. If you develop a fever, worsening shortness of breath, pain with breathing, we may want to see you in the office.   Follow Up Instructions:    I discussed the assessment and treatment plan with the patient. The patient was provided an opportunity to ask questions and all were answered. The patient agreed with the plan and demonstrated an understanding of the instructions.   The patient was advised to call back or seek an in-person evaluation if the symptoms worsen or if the condition fails to improve as anticipated.  I spent 23 minutes dedicated to the care of this patient, including pre-visit review of records, face to face time, post-visit ordering of  testing and documentation.    Lavonda Jumbo, MD

## 2023-06-08 NOTE — Progress Notes (Signed)
 NEUROLOGY FOLLOW UP OFFICE NOTE  Penny Thomas [id number]  Assessment/Plan:   1  Migraine with and without aura, without status migrainosus, not intractable 2  Primary stabbing headache   Migraine prevention:  propranolol  ER 120mg  daily  Migraine rescue:  first line - naproxen ; second line - sumatriptan  [place] Limit use of pain relievers to no more than 2 days out of week to prevent risk of rebound or medication-overuse headache. Keep headache diary Follow up one year or as needed   Subjective:  Penny Thomas is a 38 year old right-handed woman who follows up for migraines.   UPDATE: Typical migraines occur 4 times a month.  Usually lasts 45 minutes with naproxen  (hasn't had to use the sumatriptan )  No recent primary stabbing headache.   Rescue protocol:  First line, naproxen  500mg  (at work), will take sumatriptan  [place] Current NSAID:  Naproxen  500mg  Current analgesics:  tramadol  50mg  Current triptans:  Sumatriptan  [place] (have not used them) Current ergotamine:  none Other abortive therapy:  Reyvow  Current anti-emetic:  none Current muscle relaxants: none Current anti-anxiolytic:  none Current sleep aide:  none Current Antihypertensive medications:  Propranolol  ER 80mg  Current Antidepressant medications:  sertraline  50mg  Current Anticonvulsant medications:  none Current anti-CGRP:  none Current Vitamins/Herbal/Supplements:  none Current Antihistamines/Decongestants:  none Other therapy:  Reyvow  Hormone/birth control:  [name]  Other medication:  none   Caffeine:  no Alcohol:  seldom Smoker:  no Diet:  hydrates Exercise:  no Depression:  no; Anxiety:  no Other pain:  no Sleep hygiene:  okay   HISTORY: Migraines Onset:  Age 39. Location:  Varies (bi-frontal, back of head) Quality:  Squeezing/feels like I am hit in the head with a hammer Initial Intensity:  9-10/10 Aura:  blurred vision Prodrome:  no Associated symptoms: Nausea, dizziness,  photophobia, osmophobia Initial Duration:  1 to 3 days initiail Frequency:  3 to 4 days per week (16 headache days per week) Triggers:  Menstrual cycle, caffeine, scents (perfume, deoderant, hand sanitizer, coffee), chocolate, change in weather Relieving factors: Laying down Activity:  Lays down if severe  Primary Stabbing Headache She started getting a new headache in November 2022.  It is a severe stabbing headache in the left temple.  Left eye gets watery but no conjunctival injection or ptosis.  No visual disturbance, nausea, photophobia or phonophobia.  No other symptoms.  Lasts 5 to 10 minutes.  She has had about 3 episodes.  MRI and MRA of brain on 06/15/2021 personally reviewed were unremarkable.  Headaches improved with increase in propranolol .  Past NSAIDS:  Mobic , ibuprofen  800mg , Cambia  Past analgesics:  Hydrocodone -acetaminophen , tramadol  50mg , Roxicet, Excedrin Migraine Past abortive triptans:  sumatriptan  100mg , Maxalt  10mg , Relpax  20mg , sumatriptan  [place] Past ergot:  none Past muscle relaxants:  Flexeril , baclofen , tizanidine  2mg , Robaxin  Past anti-nausea:  Zofran  4mg , promethazine  25mg  Past antihypertensive medications:  no Past antidepressant medications:  amitriptyline  50mg  (weight gain) Past anticonvulsant medications:  topiramate  150mg  (effective but caused hair loss) Past vitamins/Herbal/Supplements:  no Past antihistamines/decongestants:  no Other past medications:  Reyvow  ineffective   Family history of headache:  Mom, sister, grandmother  PAST MEDICAL HISTORY: Past Medical History:  Diagnosis Date   Acute nasopharyngitis 12/11/2016   Acute non-recurrent frontal sinusitis 05/31/2020   Allergy    Anemia    takes iron supplement   Anxiety    Asthma    Asthma    prn inhaler   Blood transfusion 2007   Blood transfusion 2010   Blood transfusion without  reported diagnosis    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   Cough 05/31/2020   COVID-19 05/24/2020    Dysmenorrhea 04/08/2013   Endometriosis 04/08/2013   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   Great toe pain, left 06/16/2019   H/O abdominal supracervical subtotal hysterectomy 12/01/2019   H/O: hysterectomy 10/2019   Hemorrhage in uterus 01/20/2009   History of COVID-19 05/31/2020   History of gestational diabetes 12/02/2018   Hypertension    Migraines    Muscle cramps 03/23/2015   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 (Patient not taking: Reported on 05/14/2023)     ALVESCO  160 MCG/ACT inhaler Inhale 1 puff into the lungs 2 (two) times daily. (Patient not taking: Reported on 03/19/2023) 6.1 g 5   amoxicillin  (AMOXIL ) 500 MG capsule Take 2 capsules (1,000 mg total) by mouth 2 (two) times daily. 40 capsule 0   Bacillus Coagulans-Inulin (PROBIOTIC-PREBIOTIC PO) Take 1 capsule by mouth daily.     beclomethasone (QVAR ) 80 MCG/ACT inhaler Inhale 1 puff into the lungs 2 (two) times daily. (Patient not taking: Reported on 03/19/2023)     BIOTIN  PO Take 1 tablet by mouth daily.     cetirizine  (ZYRTEC ) 10 MG tablet Take 1 tablet by mouth daily. 30 tablet 5   Cholecalciferol  (D 5000) 125 MCG (5000 UT) capsule Take 1 capsule by mouth daily 30 capsule 3   gabapentin  (NEURONTIN ) 400 MG capsule Take 1 capsule (400 mg total) by mouth 2 (two) times daily. 60 capsule 0   Lidocaine , Anorectal, 5 % CREA Apply to affected  skin 3 times per day as needed for pain (Patient not taking: Reported on 05/14/2023) 90 g 0   Multiple Vitamin (MULTIVITAMIN WITH MINERALS) TABS tablet Take 1 tablet by mouth daily.     naloxone  (NARCAN ) nasal spray 4 mg/0.1 mL Instill 1 spray into nose as directed for accidental overdose (Patient not taking: Reported on 05/14/2023) 2 each 3   naloxone  (NARCAN ) nasal spray 4 mg/0.1 mL Use 1 (one) spray for accidental overdose (Patient not taking: Reported on 05/14/2023) 2 each 3   naproxen  (NAPROSYN ) 500 MG tablet Take 1 tablet (500 mg total) by mouth every 12 (twelve) hours as needed. (Patient not taking: Reported on 05/14/2023) 60 tablet 1   propranolol  ER (INDERAL  LA) 120 MG 24 hr capsule Take 1 capsule (120 mg total) by mouth at bedtime. 90 capsule 3   pseudoephedrine (SUDAFED) 120 MG 12 hr tablet Take 120 mg by mouth 2 (two) times daily.     Pseudoephedrine-APAP-DM (DAYQUIL PO) Take 30 mLs by mouth as needed.     pseudoephedrine-guaifenesin (MUCINEX D) 60-600 MG 12 hr tablet Take 1 tablet by mouth every 12 (twelve) hours.     sertraline  (ZOLOFT ) 50 MG tablet Take 1 tablet (50 mg total) by mouth daily. Start with 1/2 tablet once a day for 4  days then go to 1 tablet. 30 tablet 3   simethicone  (MYLICON) 125 MG chewable tablet Chew 125 mg by mouth in the morning and at bedtime. (Patient not taking: Reported on 05/14/2023)     SUMAtriptan  6 MG/0.5ML SOAJ INJECT 6 MG INTO THE SKIN AS NEEDED (MAY REPEAT IN ONE HOUR. MAXIMUM 2 INJECTIONS IN 24 HOURS.). (Patient not taking: Reported on 05/14/2023) 3 mL 5   traMADol  (ULTRAM ) 50 MG tablet Take 2 tablets (100 mg total) by mouth 2 (two) times daily as needed. (Patient not taking: Reported on 05/14/2023) 120 tablet 0   traMADol  (ULTRAM ) 50 MG tablet Take 2 tablets (100 mg total) by mouth 2 (two) times daily as needed. (Patient not taking: Reported on 05/14/2023) 120 tablet 0   traMADol  (ULTRAM ) 50 MG tablet Take 2 tablets (100 mg total) by mouth 2 (two) times  daily as needed. (Patient not taking: Reported on 05/14/2023) 120 tablet 0   traMADol  (ULTRAM ) 50 MG tablet Take 2 tablets (100 mg total) by mouth 2 (two) times daily as needed. 120 tablet 0   [DISCONTINUED] esomeprazole  (NEXIUM ) 40 MG capsule TAKE 1 CAPSULE BY MOUTH ONCE A DAY AT 12 NOON 30 capsule 1   No current facility-administered medications on file prior to visit.    ALLERGIES: Allergies  Allergen Reactions   [name]  [Elagolix] Other (See Comments)    Hallucinations    FAMILY HISTORY: Family History  Problem Relation Age of Onset   Fibromyalgia Mother    Multiple sclerosis Mother    Asthma Father    Healthy Maternal Grandmother    Healthy Maternal Grandfather    Healthy Paternal Grandmother    Healthy Paternal Grandfather    Endometriosis Sister    Healthy Brother    Healthy Child       Objective:  Blood pressure 106/63, pulse 95, height 5' 7 (1.702 m), weight 196 lb (88.9 kg), last menstrual period 11/29/2015, SpO2 100%. General: No acute distress.  Patient appears well-groomed.   Head:  Normocephalic/atraumatic Eyes:  Fundi examined but not visualized Neck: supple, no paraspinal tenderness, full range of motion Heart:  Regular rate and rhythm Neurological Exam: alert and oriented.  Speech fluent and not dysarthric, language intact.  CN II-XII intact. Bulk and tone normal, muscle strength 5/5 throughout.  Sensation to light touch intact.  Deep tendon reflexes 2+ throughout.  Finger to nose testing intact.  Gait normal, Romberg negative.   Juliene Dunnings, DO  CC:  Camie Doing, NP

## 2023-06-09 ENCOUNTER — Other Ambulatory Visit (HOSPITAL_COMMUNITY): Payer: Self-pay

## 2023-06-09 ENCOUNTER — Other Ambulatory Visit: Payer: Self-pay | Admitting: Nurse Practitioner

## 2023-06-09 ENCOUNTER — Other Ambulatory Visit: Payer: Self-pay | Admitting: Neurology

## 2023-06-09 ENCOUNTER — Encounter: Payer: Self-pay | Admitting: Neurology

## 2023-06-09 ENCOUNTER — Ambulatory Visit: Payer: BC Managed Care – PPO | Admitting: Neurology

## 2023-06-09 VITALS — BP 106/63 | HR 95 | Ht 67.0 in | Wt 196.0 lb

## 2023-06-09 DIAGNOSIS — G43109 Migraine with aura, not intractable, without status migrainosus: Secondary | ICD-10-CM | POA: Diagnosis not present

## 2023-06-09 DIAGNOSIS — F4010 Social phobia, unspecified: Secondary | ICD-10-CM

## 2023-06-09 DIAGNOSIS — G4485 Primary stabbing headache: Secondary | ICD-10-CM | POA: Diagnosis not present

## 2023-06-09 MED ORDER — SUMATRIPTAN SUCCINATE 6 MG/0.5ML ~~LOC~~ SOAJ
SUBCUTANEOUS | 5 refills | Status: AC
  Filled 2023-06-09: qty 3, 28d supply, fill #0
  Filled 2023-09-05 – 2024-05-18 (×2): qty 3, 28d supply, fill #1

## 2023-06-09 MED ORDER — SERTRALINE HCL 50 MG PO TABS
50.0000 mg | ORAL_TABLET | Freq: Every day | ORAL | 3 refills | Status: DC
  Filled 2023-06-09: qty 90, 90d supply, fill #0
  Filled 2023-07-28 – 2023-08-31 (×3): qty 90, 90d supply, fill #1
  Filled 2023-12-05: qty 90, 90d supply, fill #2
  Filled 2024-03-09: qty 90, 90d supply, fill #3

## 2023-06-09 MED ORDER — NAPROXEN 500 MG PO TABS
500.0000 mg | ORAL_TABLET | Freq: Two times a day (BID) | ORAL | 5 refills | Status: AC | PRN
  Filled 2023-06-09: qty 60, 30d supply, fill #0
  Filled 2023-08-31: qty 60, 30d supply, fill #1

## 2023-06-09 MED ORDER — PROPRANOLOL HCL ER 120 MG PO CP24
120.0000 mg | ORAL_CAPSULE | Freq: Every day | ORAL | 3 refills | Status: DC
  Filled 2023-06-09: qty 90, 90d supply, fill #0
  Filled 2023-08-01 – 2023-09-05 (×2): qty 90, 90d supply, fill #1
  Filled 2023-12-05: qty 90, 90d supply, fill #2
  Filled 2024-03-09: qty 90, 90d supply, fill #3

## 2023-06-09 NOTE — Telephone Encounter (Signed)
 Last apt 03/19/23

## 2023-06-10 ENCOUNTER — Other Ambulatory Visit (HOSPITAL_COMMUNITY): Payer: Self-pay

## 2023-06-10 MED ORDER — GABAPENTIN 400 MG PO CAPS
400.0000 mg | ORAL_CAPSULE | Freq: Two times a day (BID) | ORAL | 0 refills | Status: DC
  Filled 2023-06-10: qty 60, 30d supply, fill #0

## 2023-06-10 MED ORDER — TRAMADOL HCL 50 MG PO TABS
100.0000 mg | ORAL_TABLET | Freq: Two times a day (BID) | ORAL | 0 refills | Status: DC | PRN
  Filled 2023-06-10: qty 120, 30d supply, fill #0

## 2023-06-11 ENCOUNTER — Other Ambulatory Visit (HOSPITAL_COMMUNITY): Payer: Self-pay

## 2023-06-12 ENCOUNTER — Other Ambulatory Visit (HOSPITAL_COMMUNITY): Payer: Self-pay

## 2023-06-21 ENCOUNTER — Encounter: Payer: Self-pay | Admitting: Nurse Practitioner

## 2023-06-23 ENCOUNTER — Telehealth (INDEPENDENT_AMBULATORY_CARE_PROVIDER_SITE_OTHER): Payer: BC Managed Care – PPO | Admitting: Nurse Practitioner

## 2023-06-23 ENCOUNTER — Encounter: Payer: Self-pay | Admitting: Nurse Practitioner

## 2023-06-23 VITALS — Ht 67.0 in | Wt 198.0 lb

## 2023-06-23 DIAGNOSIS — E88819 Insulin resistance, unspecified: Secondary | ICD-10-CM

## 2023-06-23 DIAGNOSIS — E782 Mixed hyperlipidemia: Secondary | ICD-10-CM

## 2023-06-23 DIAGNOSIS — Z6831 Body mass index (BMI) 31.0-31.9, adult: Secondary | ICD-10-CM

## 2023-06-23 DIAGNOSIS — R7303 Prediabetes: Secondary | ICD-10-CM | POA: Diagnosis not present

## 2023-06-23 NOTE — Assessment & Plan Note (Signed)
Presents with concerns about weight gain despite dietary changes and exercise. Currently weighs 198 pounds. Past labs indicate elevated blood sugar averages, suggesting long-standing insulin resistance. She also has a history of gestational diabetes requiring insulin, which increases her chances of developing diabetes. We discussed that insulin resistance impairs glucose utilization, leading to hyperglycemia and increased adiposity. This condition also causes cravings and slows metabolism, complicating weight loss. Discussed that insulin resistance leads to misinterpretation of glucose availability by the brain, resulting in increased fat storage and cravings. Explained that newer injectable medications like Ozempic can help by modulating brain mechanisms and improving glucose utilization but are typically not covered by insurance without a diabetes diagnosis. Metformin, an older diabetes medication, is inexpensive but may cause diarrhea. Weight loss medications like Wegovy and Zepbound are options but have high costs and variable insurance coverage. We discussed obtaining labs to determine her current A1c and insulin values to determine if this is a factor in her weight management concerns.  - Consider metformin for insulin resistance; it is inexpensive and taken once daily but may cause diarrhea. - Consider newer injectable medications like Ozempic to modulate brain mechanisms and improve glucose utilization; not covered by insurance without a diabetes diagnosis. - Consider weight loss medications like Wegovy and Zepbound; note high cost and variable insurance coverage. - Will make determination based on lab results and follow-up with a plan.

## 2023-06-23 NOTE — Progress Notes (Signed)
Virtual Visit Encounter mychart visit.   I connected with  Penny Thomas on 06/23/23 at  1:30 PM EST by secure video and audio telemedicine application. I verified that I am speaking with the correct person using two identifiers.   I introduced myself as a Publishing rights manager with the practice. The limitations of evaluation and management by telemedicine discussed with the patient and the availability of in person appointments. The patient expressed verbal understanding and consent to proceed.  Participating parties in this visit include: Myself and patient  The patient is: Patient Location: Home I am: Provider Location: Office/Clinic Subjective:    CC and HPI: Penny Thomas is a 38 y.o. year old female presenting for new evaluation and treatment of weight.  Penny Thomas reports difficulty with weight management despite diet and exercise changes. She reports a lifelong struggle with weight management with multiple attempts at dieting and weight loss without success. She has not been on prescription medication in the past, but has tried various dieting supplements OTC over the past several years.   She has been working on a smaller portions and monitoring her intake of high fat foods to aid in weight management over the last 6 months. She has also been actively working out at least several days a week to burn excess calories. She reports despite these efforts she has only continued to gain weight and is now at her highest weight of 198lbs.   The patient has a history of higher than normal blood sugar averages, suggesting a potential insulin resistance or prediabetes. She does have a strong history of gestational diabetes requiring insulin during her pregnancies. She reports experiencing strong cravings, which may be a symptom of this condition.  Past medical history, Surgical history, Family history not pertinant except as noted below, Social history, Allergies, and medications have  been entered into the medical record, reviewed, and corrections made.   Review of Systems:  All review of systems negative except what is listed in the HPI  Objective:    Alert and oriented x 4 Speaking in clear sentences with no shortness of breath. No distress.  Impression and Recommendations:    Problem List Items Addressed This Visit     BMI 31.0-31.9,adult - Primary   Presents with concerns about weight gain despite dietary changes and exercise. Currently weighs 198 pounds. Past labs indicate elevated blood sugar averages, suggesting long-standing insulin resistance. She also has a history of gestational diabetes requiring insulin, which increases her chances of developing diabetes. We discussed that insulin resistance impairs glucose utilization, leading to hyperglycemia and increased adiposity. This condition also causes cravings and slows metabolism, complicating weight loss. Discussed that insulin resistance leads to misinterpretation of glucose availability by the brain, resulting in increased fat storage and cravings. Explained that newer injectable medications like Ozempic can help by modulating brain mechanisms and improving glucose utilization but are typically not covered by insurance without a diabetes diagnosis. Metformin, an older diabetes medication, is inexpensive but may cause diarrhea. Weight loss medications like Wegovy and Zepbound are options but have high costs and variable insurance coverage. We discussed obtaining labs to determine her current A1c and insulin values to determine if this is a factor in her weight management concerns.  - Consider metformin for insulin resistance; it is inexpensive and taken once daily but may cause diarrhea. - Consider newer injectable medications like Ozempic to modulate brain mechanisms and improve glucose utilization; not covered by insurance without a diabetes diagnosis. - Consider weight loss  medications like Wegovy and Zepbound;  note high cost and variable insurance coverage. - Will make determination based on lab results and follow-up with a plan.       Mixed hyperlipidemia   Prediabetes   Relevant Orders   Hemoglobin A1c   CBC with Differential/Platelet   TSH   Insulin, random   Other Visit Diagnoses       Insulin resistance       Relevant Orders   Hemoglobin A1c   CBC with Differential/Platelet   TSH   Insulin, random       orders and follow up as documented in EMR I discussed the assessment and treatment plan with the patient. The patient was provided an opportunity to ask questions and all were answered. The patient agreed with the plan and demonstrated an understanding of the instructions.   The patient was advised to call back or seek an in-person evaluation if the symptoms worsen or if the condition fails to improve as anticipated.  Follow-Up: in 3 weeks  I provided 30 minutes of non-face-to-face interaction with this non face-to-face encounter including intake, same-day documentation, and chart review.   Tollie Eth, NP , DNP, AGNP-c [facility]

## 2023-06-24 ENCOUNTER — Other Ambulatory Visit (HOSPITAL_COMMUNITY): Payer: Self-pay

## 2023-06-24 ENCOUNTER — Other Ambulatory Visit: Payer: BC Managed Care – PPO

## 2023-06-24 DIAGNOSIS — F4010 Social phobia, unspecified: Secondary | ICD-10-CM

## 2023-06-24 DIAGNOSIS — R7303 Prediabetes: Secondary | ICD-10-CM

## 2023-06-24 DIAGNOSIS — E88819 Insulin resistance, unspecified: Secondary | ICD-10-CM

## 2023-06-24 DIAGNOSIS — R400 Somnolence: Secondary | ICD-10-CM

## 2023-06-24 DIAGNOSIS — R7989 Other specified abnormal findings of blood chemistry: Secondary | ICD-10-CM

## 2023-06-25 LAB — HEMOGLOBIN A1C
Est. average glucose Bld gHb Est-mCnc: 111 mg/dL
Hgb A1c MFr Bld: 5.5 % (ref 4.8–5.6)

## 2023-06-25 LAB — CMP14+EGFR
ALT: 14 IU/L (ref 0–32)
AST: 19 IU/L (ref 0–40)
Albumin: 4.6 g/dL (ref 3.9–4.9)
Alkaline Phosphatase: 60 IU/L (ref 44–121)
BUN/Creatinine Ratio: 6 — ABNORMAL LOW (ref 9–23)
BUN: 6 mg/dL (ref 6–20)
Bilirubin Total: 0.5 mg/dL (ref 0.0–1.2)
CO2: 22 mmol/L (ref 20–29)
Calcium: 10.1 mg/dL (ref 8.7–10.2)
Chloride: 103 mmol/L (ref 96–106)
Creatinine, Ser: 1.02 mg/dL — ABNORMAL HIGH (ref 0.57–1.00)
Globulin, Total: 2.8 g/dL (ref 1.5–4.5)
Glucose: 106 mg/dL — ABNORMAL HIGH (ref 70–99)
Potassium: 4.8 mmol/L (ref 3.5–5.2)
Sodium: 139 mmol/L (ref 134–144)
Total Protein: 7.4 g/dL (ref 6.0–8.5)
eGFR: 73 mL/min/{1.73_m2} (ref >59)

## 2023-06-25 LAB — CBC WITH DIFFERENTIAL/PLATELET
Basophils Absolute: 0 10*3/uL (ref 0.0–0.2)
Basos: 0 %
EOS (ABSOLUTE): 0.1 10*3/uL (ref 0.0–0.4)
Eos: 1 %
Hematocrit: 40.3 % (ref 34.0–46.6)
Hemoglobin: 13.2 g/dL (ref 11.1–15.9)
Immature Grans (Abs): 0 10*3/uL (ref 0.0–0.1)
Immature Granulocytes: 0 %
Lymphocytes Absolute: 1.7 10*3/uL (ref 0.7–3.1)
Lymphs: 33 %
MCH: 28.9 pg (ref 26.6–33.0)
MCHC: 32.8 g/dL (ref 31.5–35.7)
MCV: 88 fL (ref 79–97)
Monocytes Absolute: 0.4 10*3/uL (ref 0.1–0.9)
Monocytes: 8 %
Neutrophils Absolute: 3 10*3/uL (ref 1.4–7.0)
Neutrophils: 58 %
Platelets: 297 10*3/uL (ref 150–450)
RBC: 4.57 x10E6/uL (ref 3.77–5.28)
RDW: 14.3 % (ref 11.7–15.4)
WBC: 5.3 10*3/uL (ref 3.4–10.8)

## 2023-06-25 LAB — VITAMIN D 25 HYDROXY (VIT D DEFICIENCY, FRACTURES): Vit D, 25-Hydroxy: 89.6 ng/mL (ref 30.0–100.0)

## 2023-06-25 LAB — INSULIN, RANDOM: INSULIN: 39.5 u[IU]/mL — ABNORMAL HIGH (ref 2.6–24.9)

## 2023-06-25 LAB — TSH: TSH: 2.03 u[IU]/mL (ref 0.450–4.500)

## 2023-06-26 ENCOUNTER — Encounter: Payer: Self-pay | Admitting: Nurse Practitioner

## 2023-06-30 ENCOUNTER — Encounter: Payer: Self-pay | Admitting: Nurse Practitioner

## 2023-07-01 ENCOUNTER — Other Ambulatory Visit (HOSPITAL_COMMUNITY): Payer: Self-pay

## 2023-07-01 ENCOUNTER — Other Ambulatory Visit: Payer: Self-pay

## 2023-07-01 MED ORDER — METFORMIN HCL ER (OSM) 1000 MG PO TB24
1000.0000 mg | ORAL_TABLET | Freq: Every day | ORAL | 5 refills | Status: DC
  Filled 2023-07-01: qty 30, 30d supply, fill #0
  Filled 2023-07-28: qty 30, 30d supply, fill #1
  Filled 2023-08-31: qty 30, 30d supply, fill #2
  Filled 2023-09-30 – 2023-10-01 (×2): qty 30, 30d supply, fill #3
  Filled 2023-11-11: qty 30, 30d supply, fill #4

## 2023-07-02 ENCOUNTER — Other Ambulatory Visit: Payer: Self-pay

## 2023-07-11 ENCOUNTER — Other Ambulatory Visit (HOSPITAL_COMMUNITY): Payer: Self-pay

## 2023-07-11 MED ORDER — GABAPENTIN 400 MG PO CAPS
400.0000 mg | ORAL_CAPSULE | Freq: Two times a day (BID) | ORAL | 0 refills | Status: DC
  Filled 2023-07-11: qty 60, 30d supply, fill #0

## 2023-07-11 MED ORDER — NALOXONE HCL 4 MG/0.1ML NA LIQD
NASAL | 3 refills | Status: DC
  Filled 2023-07-11: qty 2, 1d supply, fill #0

## 2023-07-11 MED ORDER — TRAMADOL HCL 50 MG PO TABS
100.0000 mg | ORAL_TABLET | Freq: Two times a day (BID) | ORAL | 0 refills | Status: DC
  Filled 2023-07-11: qty 120, 30d supply, fill #0

## 2023-07-15 ENCOUNTER — Other Ambulatory Visit (HOSPITAL_COMMUNITY): Payer: Self-pay

## 2023-07-28 ENCOUNTER — Other Ambulatory Visit (HOSPITAL_COMMUNITY): Payer: Self-pay

## 2023-07-29 ENCOUNTER — Other Ambulatory Visit: Payer: Self-pay

## 2023-07-29 ENCOUNTER — Other Ambulatory Visit (HOSPITAL_COMMUNITY): Payer: Self-pay

## 2023-07-30 ENCOUNTER — Other Ambulatory Visit (HOSPITAL_COMMUNITY): Payer: Self-pay

## 2023-07-31 ENCOUNTER — Other Ambulatory Visit: Payer: Self-pay

## 2023-08-01 ENCOUNTER — Other Ambulatory Visit (HOSPITAL_COMMUNITY): Payer: Self-pay

## 2023-08-06 ENCOUNTER — Other Ambulatory Visit (HOSPITAL_COMMUNITY): Payer: Self-pay

## 2023-08-08 ENCOUNTER — Other Ambulatory Visit (HOSPITAL_COMMUNITY): Payer: Self-pay

## 2023-08-08 MED ORDER — NALOXONE HCL 4 MG/0.1ML NA LIQD
NASAL | 3 refills | Status: DC
  Filled 2023-08-08: qty 2, 1d supply, fill #0

## 2023-08-08 MED ORDER — TRAMADOL HCL 50 MG PO TABS
ORAL_TABLET | ORAL | 0 refills | Status: DC
  Filled 2023-08-08: qty 120, 30d supply, fill #0

## 2023-08-08 MED ORDER — GABAPENTIN 400 MG PO CAPS
ORAL_CAPSULE | ORAL | 0 refills | Status: DC
  Filled 2023-08-08: qty 60, 30d supply, fill #0

## 2023-08-08 MED ORDER — CYCLOBENZAPRINE HCL 10 MG PO TABS
ORAL_TABLET | ORAL | 0 refills | Status: DC
  Filled 2023-08-08: qty 45, 14d supply, fill #0

## 2023-08-16 ENCOUNTER — Encounter: Payer: Self-pay | Admitting: Nurse Practitioner

## 2023-08-18 ENCOUNTER — Other Ambulatory Visit: Payer: Self-pay

## 2023-08-18 DIAGNOSIS — J329 Chronic sinusitis, unspecified: Secondary | ICD-10-CM

## 2023-08-30 ENCOUNTER — Other Ambulatory Visit (HOSPITAL_COMMUNITY): Payer: Self-pay

## 2023-08-31 ENCOUNTER — Other Ambulatory Visit (HOSPITAL_COMMUNITY): Payer: Self-pay

## 2023-09-01 ENCOUNTER — Other Ambulatory Visit (HOSPITAL_COMMUNITY): Payer: Self-pay

## 2023-09-01 ENCOUNTER — Other Ambulatory Visit: Payer: Self-pay

## 2023-09-02 ENCOUNTER — Other Ambulatory Visit (HOSPITAL_COMMUNITY): Payer: Self-pay

## 2023-09-02 ENCOUNTER — Other Ambulatory Visit: Payer: Self-pay

## 2023-09-05 ENCOUNTER — Other Ambulatory Visit (HOSPITAL_COMMUNITY): Payer: Self-pay

## 2023-09-08 ENCOUNTER — Other Ambulatory Visit (HOSPITAL_COMMUNITY): Payer: Self-pay

## 2023-09-08 ENCOUNTER — Other Ambulatory Visit: Payer: BC Managed Care – PPO

## 2023-09-08 ENCOUNTER — Ambulatory Visit: Payer: BC Managed Care – PPO | Admitting: Nurse Practitioner

## 2023-09-08 ENCOUNTER — Other Ambulatory Visit: Payer: Self-pay

## 2023-09-08 VITALS — BP 128/82 | HR 96 | Wt 198.6 lb

## 2023-09-08 DIAGNOSIS — J329 Chronic sinusitis, unspecified: Secondary | ICD-10-CM | POA: Diagnosis not present

## 2023-09-08 DIAGNOSIS — R7303 Prediabetes: Secondary | ICD-10-CM

## 2023-09-08 DIAGNOSIS — Z6831 Body mass index (BMI) 31.0-31.9, adult: Secondary | ICD-10-CM | POA: Diagnosis not present

## 2023-09-08 DIAGNOSIS — Z9109 Other allergy status, other than to drugs and biological substances: Secondary | ICD-10-CM

## 2023-09-08 DIAGNOSIS — E88819 Insulin resistance, unspecified: Secondary | ICD-10-CM | POA: Insufficient documentation

## 2023-09-08 DIAGNOSIS — R3915 Urgency of urination: Secondary | ICD-10-CM

## 2023-09-08 DIAGNOSIS — Z90710 Acquired absence of both cervix and uterus: Secondary | ICD-10-CM

## 2023-09-08 MED ORDER — AZELASTINE HCL 0.05 % OP SOLN
1.0000 [drp] | Freq: Two times a day (BID) | OPHTHALMIC | 12 refills | Status: AC
  Filled 2023-09-08: qty 6, 30d supply, fill #0
  Filled 2023-11-17: qty 6, 30d supply, fill #1
  Filled 2024-02-10: qty 6, 30d supply, fill #2
  Filled 2024-03-31: qty 6, 30d supply, fill #3
  Filled 2024-06-01: qty 6, 30d supply, fill #4

## 2023-09-08 MED ORDER — AZITHROMYCIN 250 MG PO TABS
ORAL_TABLET | ORAL | 0 refills | Status: AC
Start: 2023-09-08 — End: 2023-09-14
  Filled 2023-09-08: qty 6, 5d supply, fill #0

## 2023-09-08 MED ORDER — PHENTERMINE HCL 37.5 MG PO TABS
37.5000 mg | ORAL_TABLET | Freq: Every day | ORAL | 0 refills | Status: DC
  Filled 2023-09-08: qty 90, 90d supply, fill #0

## 2023-09-08 NOTE — Assessment & Plan Note (Signed)
 Struggles with weight management. Experiences increased hunger with exercise and difficulty with portion control. Exercise is difficult due to chronic back pain.  Discussed potential pharmacological interventions, including phentermine, to aid in weight loss. Phentermine is a stimulant that can help reduce appetite and increase metabolism, typically used short-term for about six months. - Prescribe phentermine for weight loss - Advise taking phentermine in the morning due to its stimulant effects - OK to hold metformin while starting this or continue - Monitor weight and appetite changes

## 2023-09-08 NOTE — Assessment & Plan Note (Signed)
 Repeat labs today since starting metformin.

## 2023-09-08 NOTE — Progress Notes (Signed)
 Penny Fennel, DNP, AGNP-c [facility]  [address] [place], [place] [address] [phone]  ESTABLISHED PATIENT- Chronic Health and/or Follow-Up Visit  Blood pressure 128/82, pulse 96, weight 198 lb 9.6 oz (90.1 kg), last menstrual period 11/29/2015.    Penny Thomas is a 38 y.o. year old female presenting today for evaluation and management of chronic conditions.   History of Present Illness Penny Thomas "Penny Thomas" is a 38 year old female who presents for follow-up and recheck of labs.  She was previously started on metformin due to high insulin levels and a history of prediabetes. Her A1c was 5.5% at the last check, down from 5.8% previously. No significant weight loss has been noted since starting metformin, and her weight tends to increase during the winter months. She is currently at her highest weight of 198 pounds. She experiences increased hunger, especially after exercising, which she attributes to increased physical activity. She has been trying to manage her weight by walking her dogs and using a mini stepper but finds herself overeating, particularly on peanuts and cashews. She drinks a significant amount of water daily, filling a 40-ounce tumbler at least three times while at work.  She reports a new issue of urinary urgency, particularly when she arrives home, which she associates with starting metformin. No burning or symptoms of a urinary tract infection are present.  She experiences recurrent sinus infections, with her son noting they occur around her birthday at the end of May. She recently had a sinus infection that resolved before she could be seen for a CT scan. Antibiotics have not been prescribed as the infection cleared up by the time of her appointment.  She has a history of allergies, currently taking Zyrtec and using over-the-counter eye drops for her symptoms. She experiences watery eyes, particularly in one eye, which she  finds bothersome.  She underwent a hysterectomy in 2021, which included the removal of the right fallopian tube. She reports persistent bloating but no longer experiences heavy periods. She mentions a significant weight gain post-hysterectomy, which she attributes to potential hormonal imbalances.  All ROS negative with exception of what is listed above.   PHYSICAL EXAM Physical Exam Vitals and nursing note reviewed.  Constitutional:      General: She is not in acute distress.    Appearance: Normal appearance.  HENT:     Head: Normocephalic.  Eyes:     General:        Right eye: No discharge.        Left eye: Discharge present. Cardiovascular:     Rate and Rhythm: Normal rate and regular rhythm.     Pulses: Normal pulses.     Heart sounds: Normal heart sounds.  Pulmonary:     Effort: Pulmonary effort is normal.     Breath sounds: Normal breath sounds.  Musculoskeletal:     Right lower leg: No edema.     Left lower leg: No edema.  Skin:    General: Skin is warm and dry.     Capillary Refill: Capillary refill takes less than 2 seconds.  Neurological:     Mental Status: She is alert and oriented to person, place, and time.  Psychiatric:        Mood and Affect: Mood normal.        Behavior: Behavior normal.      PLAN Problem List Items Addressed This Visit     Prediabetes   Previously diagnosed with prediabetes, her A1c improved from  5.8 to 5.5. Metformin was initiated due to high insulin levels, with no significant weight loss. Seasonal weight pattern noted, with weight increasing in winter and decreasing in summer. Encouraged to engage in outdoor activities as the weather improves. - Hold metformin to determine if this is causing increased urinary urgency. If not, restart.  - Encourage outdoor activities and exercise as weather improves       Relevant Orders   Hemoglobin A1c   Comprehensive metabolic panel with GFR   CBC with Differential/Platelet   Insulin, random    History of hysterectomy   Post-hysterectomy weight gain suggests possible hormonal imbalance. Hormone panel ordered to assess estrogen, FSH, and LH levels. - Order hormone panel including estrogen, FSH, and LH levels      BMI 31.0-31.9,adult   Struggles with weight management. Experiences increased hunger with exercise and difficulty with portion control. Exercise is difficult due to chronic back pain.  Discussed potential pharmacological interventions, including phentermine, to aid in weight loss. Phentermine is a stimulant that can help reduce appetite and increase metabolism, typically used short-term for about six months. - Prescribe phentermine for weight loss - Advise taking phentermine in the morning due to its stimulant effects - OK to hold metformin while starting this or continue - Monitor weight and appetite changes      Relevant Medications   phentermine (ADIPEX-P) 37.5 MG tablet   Other Relevant Orders   Estradiol   FSH/LH   Testosterone, Total, LC/MS/MS   Hemoglobin A1c   Comprehensive metabolic panel with GFR   CBC with Differential/Platelet   Insulin, random   Insulin resistance - Primary   Repeat labs today since starting metformin.       Relevant Orders   Hemoglobin A1c   Comprehensive metabolic panel with GFR   CBC with Differential/Platelet   Insulin, random   Recurrent sinusitis   Recurrent sinus infections occur 4-5 times a year in a pattern. A recent infection resolved before a CT scan could be performed by ENT. A CT scan during an active infection is planned to better assess the condition.  - Prescribe antibiotics for future sinus infection to start after the CT is complete - Schedule a CT scan during an active sinus infection      Relevant Medications   azithromycin (ZITHROMAX) 250 MG tablet   Environmental allergies   Unilateral eye watering and irritation likely due to allergies. Currently taking Zyrtec and using over-the-counter eye drops.  Prescription eye drops may be needed if over-the-counter options are insufficient. - Prescribe azelastine eye drops - Advise contacting if prescription is not covered by insurance      Relevant Medications   azelastine (OPTIVAR) 0.05 % ophthalmic solution   Urinary urgency   New onset urinary urgency since starting metformin, without burning or UTI symptoms. Although not a typical side effect, there is a temporal relationship. Consider holding metformin to assess symptom resolution. - Monitor urinary symptoms - Consider holding metformin to see if symptoms resolve       Return in about 6 months (around 03/09/2024) for Med Management 30.  Penny Fennel, DNP, AGNP-c

## 2023-09-08 NOTE — Assessment & Plan Note (Signed)
 Recurrent sinus infections occur 4-5 times a year in a pattern. A recent infection resolved before a CT scan could be performed by ENT. A CT scan during an active infection is planned to better assess the condition.  - Prescribe antibiotics for future sinus infection to start after the CT is complete - Schedule a CT scan during an active sinus infection

## 2023-09-08 NOTE — Assessment & Plan Note (Signed)
 Unilateral eye watering and irritation likely due to allergies. Currently taking Zyrtec and using over-the-counter eye drops. Prescription eye drops may be needed if over-the-counter options are insufficient. - Prescribe azelastine eye drops - Advise contacting if prescription is not covered by insurance

## 2023-09-08 NOTE — Assessment & Plan Note (Addendum)
 Previously diagnosed with prediabetes, her A1c improved from 5.8 to 5.5. Metformin was initiated due to high insulin levels, with no significant weight loss. Seasonal weight pattern noted, with weight increasing in winter and decreasing in summer. Encouraged to engage in outdoor activities as the weather improves. - Hold metformin to determine if this is causing increased urinary urgency. If not, restart.  - Encourage outdoor activities and exercise as weather improves

## 2023-09-08 NOTE — Patient Instructions (Signed)
 Take the phentermine in the morning. This will give you more energy and could keep you awake if you take it too late in the day.   You can stop the metformin while you start this. If the urinary urgency goes away, then we can assume this was from the metformin, but if it doesn't go away, let me know.   We will check your hormones and I will let you know if we need to change anything based on the results.   WEIGHT LOSS PLANNING  For best management of weight, it is vital to balance intake versus output. This means the number of calories burned per day must be less than the calories you take in with food and drink.   I recommend trying to follow a diet with the following: Calories: 1200-1500 calories per day Carbohydrates: 150-180 grams of carbohydrates per day  Why: Gives your body enough "quick fuel" for cells to maintain normal function without sending them into starvation mode.  Protein: At least 90 grams of protein per day- 30 grams with each meal Why: Protein takes longer and uses more energy than carbohydrates to break down for fuel. The carbohydrates in your meals serves as quick energy sources and proteins help use some of that extra quick energy to break down to produce long term energy. This helps you not feel hungry as quickly and protein breakdown burns calories.  Water: Drink AT LEAST 64 ounces of water per day  Why: Water is essential to healthy metabolism. Water helps to fill the stomach and keep you fuller longer. Water is required for healthy digestion and filtering of waste in the body.  Fat: Limit fats in your diet- when choosing fats, choose foods with lower fats content such as lean meats (chicken, fish, [place]).  Why: Increased fat intake leads to storage "for later". Once you burn your carbohydrate energy, your body goes into fat and protein breakdown mode to help you loose weight.  Cholesterol: Fats and oils that are LIQUID at room temperature are best. Choose vegetable  oils (olive oil, avocado oil, nuts). Avoid fats that are SOLID at room temperature (animal fats, processed meats). Healthy fats are often found in whole grains, beans, nuts, seeds, and berries.  Why: Elevated cholesterol levels lead to build up of cholesterol on the inside of your blood vessels. This will eventually cause the blood vessels to become hard and can lead to high blood pressure and damage to your organs. When the blood flow is reduced, but the pressure is high from cholesterol buildup, parts of the cholesterol can break off and form clots that can go to the brain or heart leading to a stroke or heart attack.  Fiber: Increase amount of SOLUBLE the fiber in your diet. This helps to fill you up, lowers cholesterol, and helps with digestion. Some foods high in soluble fiber are oats, peas, beans, apples, carrots, barley, and citrus fruits.   Why: Fiber fills you up, helps remove excess cholesterol, and aids in healthy digestion which are all very important in weight management.   I recommend the following as a minimum activity routine: Purposeful walk or other physical activity at least 20 minutes every single day. This means purposefully taking a walk, jog, bike, swim, treadmill, elliptical, dance, etc.  This activity should be ABOVE your normal daily activities, such as walking at work. Goal exercise should be at least 150 minutes a week- work your way up to this.   Heart Rate: Your maximum exercise  heart rate should be 220 - Your Age in Years. When exercising, get your heart rate up, but avoid going over the maximum targeted heart rate.  60-70% of your maximum heart rate is where you tend to burn the most fat. To find this number:  220 - Age In Years= Max HR  Max HR x 0.6 (or 0.7) = Fat Burning HR The Fat Burning HR is your goal heart rate while working out to burn the most fat.  NEVER exercise to the point your feel lightheaded, weak, nauseated, dizzy. If you experience ANY of these  symptoms- STOP exercise! Allow yourself to cool down and your heart rate to come down. Then restart slower next time.  If at ANY TIME you feel chest pain or chest pressure during exercise, STOP IMMEDIATELY and seek medical attention.

## 2023-09-08 NOTE — Assessment & Plan Note (Signed)
 New onset urinary urgency since starting metformin, without burning or UTI symptoms. Although not a typical side effect, there is a temporal relationship. Consider holding metformin to assess symptom resolution. - Monitor urinary symptoms - Consider holding metformin to see if symptoms resolve

## 2023-09-08 NOTE — Assessment & Plan Note (Signed)
 Post-hysterectomy weight gain suggests possible hormonal imbalance. Hormone panel ordered to assess estrogen, FSH, and LH levels. - Order hormone panel including estrogen, FSH, and LH levels

## 2023-09-09 ENCOUNTER — Other Ambulatory Visit

## 2023-09-09 ENCOUNTER — Other Ambulatory Visit (HOSPITAL_COMMUNITY): Payer: Self-pay

## 2023-09-09 ENCOUNTER — Encounter: Payer: Self-pay | Admitting: Nurse Practitioner

## 2023-09-09 ENCOUNTER — Ambulatory Visit: Payer: Self-pay

## 2023-09-09 DIAGNOSIS — R300 Dysuria: Secondary | ICD-10-CM | POA: Diagnosis not present

## 2023-09-09 MED ORDER — CYCLOBENZAPRINE HCL 10 MG PO TABS
10.0000 mg | ORAL_TABLET | Freq: Three times a day (TID) | ORAL | 0 refills | Status: DC | PRN
  Filled 2023-09-09: qty 45, 15d supply, fill #0

## 2023-09-09 MED ORDER — TRAMADOL HCL 50 MG PO TABS
100.0000 mg | ORAL_TABLET | Freq: Two times a day (BID) | ORAL | 0 refills | Status: DC | PRN
  Filled 2023-09-09: qty 120, 30d supply, fill #0

## 2023-09-09 MED ORDER — NALOXONE HCL 4 MG/0.1ML NA LIQD
NASAL | 3 refills | Status: DC
  Filled 2023-09-09: qty 2, 1d supply, fill #0

## 2023-09-09 MED ORDER — GABAPENTIN 400 MG PO CAPS
400.0000 mg | ORAL_CAPSULE | Freq: Two times a day (BID) | ORAL | 0 refills | Status: DC
Start: 2023-09-09 — End: 2023-10-08
  Filled 2023-09-09: qty 60, 30d supply, fill #0

## 2023-09-09 NOTE — Telephone Encounter (Signed)
 Copied from CRM [id number]. Topic: Clinical - Red Word Triage >> Sep 09, 2023 12:20 PM Everette C wrote: [facility] that prompted transfer to Nurse Triage: The patient shares that they noticed blood in their urine this morning and have previously mentioned issues with incontinence with their provider   Chief Complaint: Painful urination  Symptoms: Painful urination, blood in urine, urinary incontinence Frequency: Every urination  Disposition: [] ED /[] Urgent Care (no appt availability in office) / [] Appointment(In office/virtual)/ []  [facility]/ [] Home Care/ [] Refused Recommended Disposition /[]  Mobile Bus/ [x]  Follow-up with PCP Additional Notes: Patient states that she has been experiencing painful urination and blood in her urine that began today. She states that she has had 3 episodes of this so far. Patient states she is also experiencing some urinary incontinence which she has mention to her PCP before. Patient messaged the clinic about these symptoms earlier today. I advised that they had recently replied for her to come into the office today to provide a urine sample. Patient understood and is agreeable with this plan.      Reason for Disposition  Blood in urine (red, pink, or tea-colored)  Answer Assessment - Initial Assessment Questions 1. SEVERITY: "How bad is the pain?"  (e.g., Scale 1-10; mild, moderate, or severe)   - MILD (1-3): complains slightly about urination hurting   - MODERATE (4-7): interferes with normal activities     - SEVERE (8-10): excruciating, unwilling or unable to urinate because of the pain      8/10 2. FREQUENCY: "How many times have you had painful urination today?"      3 times  3. PATTERN: "Is pain present every time you urinate or just sometimes?"      Every time  4. ONSET: "When did the painful urination start?"      Today  5. FEVER: "Do you have a fever?" If Yes, ask: "What is your temperature, how was it measured, and when did it  start?"     No 6. PAST UTI: "Have you had a urine infection before?" If Yes, ask: "When was the last time?" and "What happened that time?"      Yes, but not with blood in urine  7. CAUSE: "What do you think is causing the painful urination?"  (e.g., UTI, scratch, Herpes sore)     Possible UTI 8. OTHER SYMPTOMS: "Do you have any other symptoms?" (e.g., blood in urine, flank pain, genital sores, urgency, vaginal discharge)     Blood in urine, cloudy urine  9. PREGNANCY: "Is there any chance you are pregnant?" "When was your last menstrual period?"     No  Protocols used: Urination Pain - Female-A-AH

## 2023-09-10 LAB — CBC WITH DIFFERENTIAL/PLATELET
Basophils Absolute: 0 10*3/uL (ref 0.0–0.2)
Basos: 0 %
EOS (ABSOLUTE): 0.1 10*3/uL (ref 0.0–0.4)
Eos: 2 %
Hematocrit: 38.4 % (ref 34.0–46.6)
Hemoglobin: 12.9 g/dL (ref 11.1–15.9)
Immature Grans (Abs): 0 10*3/uL (ref 0.0–0.1)
Immature Granulocytes: 0 %
Lymphocytes Absolute: 1.8 10*3/uL (ref 0.7–3.1)
Lymphs: 35 %
MCH: 29 pg (ref 26.6–33.0)
MCHC: 33.6 g/dL (ref 31.5–35.7)
MCV: 86 fL (ref 79–97)
Monocytes Absolute: 0.4 10*3/uL (ref 0.1–0.9)
Monocytes: 7 %
Neutrophils Absolute: 2.9 10*3/uL (ref 1.4–7.0)
Neutrophils: 56 %
Platelets: 282 10*3/uL (ref 150–450)
RBC: 4.45 x10E6/uL (ref 3.77–5.28)
RDW: 14.5 % (ref 11.7–15.4)
WBC: 5.3 10*3/uL (ref 3.4–10.8)

## 2023-09-10 LAB — POCT URINALYSIS DIP (CLINITEK)
Bilirubin, UA: NEGATIVE — AB
Glucose, UA: NEGATIVE mg/dL
Ketones, POC UA: NEGATIVE mg/dL
Leukocytes, UA: NEGATIVE
Nitrite, UA: POSITIVE — AB
Spec Grav, UA: 1.01 (ref 1.010–1.025)
Urobilinogen, UA: 0.2 U/dL
pH, UA: 6 (ref 5.0–8.0)

## 2023-09-10 LAB — COMPREHENSIVE METABOLIC PANEL WITH GFR
ALT: 14 IU/L (ref 0–32)
AST: 19 IU/L (ref 0–40)
Albumin: 4.6 g/dL (ref 3.9–4.9)
Alkaline Phosphatase: 65 IU/L (ref 44–121)
BUN/Creatinine Ratio: 14 (ref 9–23)
BUN: 15 mg/dL (ref 6–20)
Bilirubin Total: 0.5 mg/dL (ref 0.0–1.2)
CO2: 24 mmol/L (ref 20–29)
Calcium: 9.7 mg/dL (ref 8.7–10.2)
Chloride: 102 mmol/L (ref 96–106)
Creatinine, Ser: 1.1 mg/dL — ABNORMAL HIGH (ref 0.57–1.00)
Globulin, Total: 2.5 g/dL (ref 1.5–4.5)
Glucose: 86 mg/dL (ref 70–99)
Potassium: 4.4 mmol/L (ref 3.5–5.2)
Sodium: 140 mmol/L (ref 134–144)
Total Protein: 7.1 g/dL (ref 6.0–8.5)
eGFR: 66 mL/min/{1.73_m2} (ref >59)

## 2023-09-10 LAB — HEMOGLOBIN A1C
Est. average glucose Bld gHb Est-mCnc: 114 mg/dL
Hgb A1c MFr Bld: 5.6 % (ref 4.8–5.6)

## 2023-09-10 LAB — FSH/LH
FSH: 5.6 m[IU]/mL
LH: 5 m[IU]/mL

## 2023-09-10 LAB — ESTRADIOL: Estradiol: 32.9 pg/mL

## 2023-09-10 LAB — INSULIN, RANDOM: INSULIN: 40.7 u[IU]/mL — ABNORMAL HIGH (ref 2.6–24.9)

## 2023-09-10 LAB — TESTOSTERONE, TOTAL, LC/MS/MS: Testosterone, total: 9.9 ng/dL — ABNORMAL LOW (ref 10.0–55.0)

## 2023-09-11 ENCOUNTER — Other Ambulatory Visit: Payer: Self-pay | Admitting: Nurse Practitioner

## 2023-09-11 ENCOUNTER — Encounter: Payer: Self-pay | Admitting: Nurse Practitioner

## 2023-09-11 ENCOUNTER — Other Ambulatory Visit (HOSPITAL_COMMUNITY): Payer: Self-pay

## 2023-09-11 DIAGNOSIS — Z6831 Body mass index (BMI) 31.0-31.9, adult: Secondary | ICD-10-CM

## 2023-09-11 DIAGNOSIS — N3090 Cystitis, unspecified without hematuria: Secondary | ICD-10-CM

## 2023-09-11 DIAGNOSIS — E782 Mixed hyperlipidemia: Secondary | ICD-10-CM

## 2023-09-11 DIAGNOSIS — R7303 Prediabetes: Secondary | ICD-10-CM

## 2023-09-11 DIAGNOSIS — E88819 Insulin resistance, unspecified: Secondary | ICD-10-CM

## 2023-09-11 MED ORDER — FLUCONAZOLE 150 MG PO TABS
ORAL_TABLET | ORAL | 0 refills | Status: DC
Start: 2023-09-11 — End: 2023-11-17
  Filled 2023-09-11: qty 2, 3d supply, fill #0

## 2023-09-11 MED ORDER — NITROFURANTOIN MONOHYD MACRO 100 MG PO CAPS
100.0000 mg | ORAL_CAPSULE | Freq: Two times a day (BID) | ORAL | 0 refills | Status: DC
  Filled 2023-09-11: qty 10, 5d supply, fill #0

## 2023-09-13 LAB — URINE CULTURE

## 2023-09-18 ENCOUNTER — Other Ambulatory Visit (HOSPITAL_COMMUNITY): Payer: Self-pay

## 2023-09-18 MED ORDER — WEGOVY 0.25 MG/0.5ML ~~LOC~~ SOAJ
0.2500 mg | SUBCUTANEOUS | 0 refills | Status: DC
  Filled 2023-09-18: qty 2, 28d supply, fill #0

## 2023-09-19 ENCOUNTER — Other Ambulatory Visit (HOSPITAL_COMMUNITY): Payer: Self-pay

## 2023-09-19 ENCOUNTER — Telehealth: Payer: Self-pay

## 2023-09-19 NOTE — Telephone Encounter (Signed)
 Please note that we have received a request from ''[employer]'' asking for a Prior Authorization for the pts (WEGOVY ) 0.25 MG/0.5ML SOAJ . However after running a test claim this Rx is  ''A Plan Benefit Exclusion/Weight loss drugs are Not covered''       The office has been notified. Nothing further needed.

## 2023-09-22 ENCOUNTER — Ambulatory Visit: Payer: Self-pay

## 2023-09-22 ENCOUNTER — Other Ambulatory Visit (HOSPITAL_COMMUNITY): Payer: Self-pay

## 2023-09-22 ENCOUNTER — Other Ambulatory Visit: Payer: Self-pay | Admitting: Nurse Practitioner

## 2023-09-22 NOTE — Telephone Encounter (Signed)
 Copied from CRM [id number]. Topic: Clinical - Prescription Issue >> Sep 22, 2023  3:25 PM Santiya F wrote: Reason for CRM: [facility] is calling in needing clarification on a prescription. Pharmacist says there are two different strengths on the Testerone cream and she needs to know which one is the right one. Reason for Disposition  [1] Follow-up call from patient regarding patient's clinical status AND [2] information NON-URGENT  Answer Assessment - Initial Assessment Questions 1. REASON FOR CALL or QUESTION: "What is your reason for calling today?" or "How can I best help you?" or "What question do you have that I can help answer?"     [facility] is calling in needing clarification on a prescription. Pharmacist says there are two different strengths on the Testerone cream and she needs to know which one is the right one. 2. CALLER: Document the source of call. (e.g., laboratory, patient).     Pharmacy  Protocols used: PCP Call - No Triage-A-AH

## 2023-09-23 ENCOUNTER — Other Ambulatory Visit: Payer: Self-pay | Admitting: Nurse Practitioner

## 2023-09-23 DIAGNOSIS — Z79890 Hormone replacement therapy (postmenopausal): Secondary | ICD-10-CM

## 2023-09-23 MED ORDER — NONFORMULARY OR COMPOUNDED ITEM: 1 refills | Status: DC

## 2023-09-30 ENCOUNTER — Other Ambulatory Visit: Payer: Self-pay

## 2023-09-30 ENCOUNTER — Other Ambulatory Visit (HOSPITAL_COMMUNITY): Payer: Self-pay

## 2023-10-01 ENCOUNTER — Telehealth: Payer: Self-pay

## 2023-10-01 ENCOUNTER — Other Ambulatory Visit (HOSPITAL_COMMUNITY): Payer: Self-pay

## 2023-10-01 NOTE — Telephone Encounter (Signed)
 Pharmacy Patient Advocate Encounter  Insurance verification completed.   The patient is insured through [facility]   Ran test claim for metFORMIN  HCl ER (OSM) 1000MG  er tablets. Per test claim the pts Plan doesn't cover this specific Rx. However the patient usually gets this rx filled via [facility] under ([facility]).  Pharmacy has been contacted.Rx is being filled   This test claim was processed through [facility]- copay amounts may vary at other pharmacies due to pharmacy/plan contracts, or as the patient moves through the different stages of their insurance plan.

## 2023-10-04 ENCOUNTER — Other Ambulatory Visit (HOSPITAL_COMMUNITY): Payer: Self-pay

## 2023-10-06 ENCOUNTER — Other Ambulatory Visit (HOSPITAL_COMMUNITY): Payer: Self-pay

## 2023-10-08 ENCOUNTER — Other Ambulatory Visit (HOSPITAL_COMMUNITY): Payer: Self-pay

## 2023-10-08 ENCOUNTER — Encounter (HOSPITAL_COMMUNITY): Payer: Self-pay

## 2023-10-08 MED ORDER — NALOXONE HCL 4 MG/0.1ML NA LIQD
NASAL | 3 refills | Status: DC
  Filled 2023-10-08: qty 2, 1d supply, fill #0

## 2023-10-08 MED ORDER — GABAPENTIN 400 MG PO CAPS
400.0000 mg | ORAL_CAPSULE | Freq: Two times a day (BID) | ORAL | 0 refills | Status: DC
  Filled 2023-10-08: qty 60, 30d supply, fill #0

## 2023-10-08 MED ORDER — TRAMADOL HCL 50 MG PO TABS
100.0000 mg | ORAL_TABLET | Freq: Two times a day (BID) | ORAL | 0 refills | Status: AC | PRN
  Filled 2023-10-08: qty 120, 30d supply, fill #0

## 2023-10-08 MED ORDER — CELECOXIB 200 MG PO CAPS
200.0000 mg | ORAL_CAPSULE | Freq: Two times a day (BID) | ORAL | 0 refills | Status: DC | PRN
  Filled 2023-10-08: qty 60, 30d supply, fill #0

## 2023-10-08 MED ORDER — CYCLOBENZAPRINE HCL 5 MG PO TABS
5.0000 mg | ORAL_TABLET | Freq: Three times a day (TID) | ORAL | 0 refills | Status: DC
  Filled 2023-10-08: qty 45, 15d supply, fill #0

## 2023-10-09 ENCOUNTER — Other Ambulatory Visit (HOSPITAL_COMMUNITY): Payer: Self-pay

## 2023-10-09 ENCOUNTER — Encounter: Payer: Self-pay | Admitting: Nurse Practitioner

## 2023-10-09 NOTE — Telephone Encounter (Signed)
Pt scheduled for tomorrow afternoon.

## 2023-10-10 ENCOUNTER — Encounter: Payer: Self-pay | Admitting: Orthopedic Surgery

## 2023-10-10 ENCOUNTER — Ambulatory Visit: Admitting: Medical

## 2023-10-10 VITALS — BP 108/68 | HR 75 | Temp 98.5°F | Wt 197.6 lb

## 2023-10-10 DIAGNOSIS — G8929 Other chronic pain: Secondary | ICD-10-CM | POA: Diagnosis not present

## 2023-10-10 DIAGNOSIS — Z9889 Other specified postprocedural states: Secondary | ICD-10-CM | POA: Diagnosis not present

## 2023-10-10 DIAGNOSIS — M545 Low back pain, unspecified: Secondary | ICD-10-CM | POA: Diagnosis not present

## 2023-10-10 DIAGNOSIS — M79605 Pain in left leg: Secondary | ICD-10-CM | POA: Diagnosis not present

## 2023-10-10 DIAGNOSIS — R253 Fasciculation: Secondary | ICD-10-CM

## 2023-10-10 NOTE — Progress Notes (Signed)
 Subjective:  Penny Thomas is a 38 y.o. female who presents for Chief Complaint  Patient presents with   Acute Visit    Left leg pain and spasms happened before stopped then started again about a month ago. Left foot get numb, left knee locks ups and she has to hold on to something so she doesn't fall. Pain level is at a 7. When she sits it feels better and when she gets up and walks it makes it worse. Has been been seeing pain management for back pain since she had back surgery in 2023.      Here for leg pain and back pain.  She has a history of chronic back pain.  She had lumbar surgery in 2023 by Dr. Richardo Chandler.  She sees [facility] regularly.  She is currently on tramadol  twice daily, just started on Celebrex  and Flexeril  for the first time today.  She is on gabapentin  400 mg twice daily.  In the past month she has had worse leg pain, tingling in the left leg, sometimes the left knee and thigh hurts as well.  She is active on the job but no other specific exercise.  These pains are similar to what she felt before her prior back surgery.  They did get better after the back surgery but now the symptoms seem to be returning.  She did fall down some steps a month ago but the symptoms have been present before the fall  Last physical therapy was after her surgery in 2023  No recent chiropractor physical therapy or massage therapy  No other aggravating or relieving factors.    No other c/o.  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   Great toe pain, left 06/16/2019   H/O abdominal supracervical subtotal hysterectomy 12/01/2019   H/O: hysterectomy 10/2019   Hemorrhage in uterus 01/20/2009   History of COVID-19 05/31/2020   History of gestational diabetes 12/02/2018   Hypertension    Migraines    Muscle cramps 03/23/2015   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     ALVESCO  160 MCG/ACT inhaler Inhale 1 puff into the lungs 2 (two) times daily. 6.1 g 5   azelastine  (OPTIVAR ) 0.05 % ophthalmic solution Place 1 drop into both eyes 2 (two) times daily. 6 mL 12   Bacillus Coagulans-Inulin (PROBIOTIC-PREBIOTIC PO) Take 1 capsule by mouth daily.  beclomethasone (QVAR ) 80 MCG/ACT inhaler Inhale 1 puff into the lungs 2 (two) times daily.     BIOTIN  PO Take 1 tablet by mouth daily.     celecoxib  (CELEBREX ) 200 MG capsule Take 1 capsule (200 mg total) by mouth 2 (two) times daily as needed for pain. 60 capsule 0   cetirizine  (ZYRTEC ) 10 MG tablet Take 1 tablet by mouth daily. 30 tablet 5   Cholecalciferol  (D 5000) 125 MCG (5000 UT) capsule Take 1 capsule by mouth daily 30 capsule 3   cyclobenzaprine  (FLEXERIL ) 10 MG tablet Take 1 tablet by mouth 3 times per day as needed for muscle spasm 45 tablet 0   cyclobenzaprine  (FLEXERIL ) 10 MG tablet Take 1 tablet (10 mg total) by mouth 3 (three) times daily as needed for muscle spasms. 45 tablet 0   cyclobenzaprine  (FLEXERIL ) 5 MG tablet Take 1 tablet (5 mg  total) by mouth 3 (three) times daily for muscle spasms. 45 tablet 0   gabapentin  (NEURONTIN ) 400 MG capsule Take 1 capsule (400 mg total) by mouth 2 (two) times daily. 60 capsule 0   metformin  (FORTAMET ) 1000 MG (OSM) 24 hr tablet Take 1 tablet (1,000 mg total) by mouth at bedtime. Take with dinner 30 tablet 5   Multiple Vitamin (MULTIVITAMIN WITH MINERALS) TABS tablet Take 1 tablet by mouth daily.     naproxen  (NAPROSYN ) 500 MG tablet Take 1 tablet (500 mg total) by mouth every 12 (twelve) hours as needed. 60 tablet 5   nitrofurantoin , macrocrystal-monohydrate, (MACROBID ) 100 MG capsule Take 1 capsule (100 mg total) by mouth 2 (two) times daily. 10 capsule 0   NONFORMULARY OR COMPOUNDED ITEM Topical testosterone  cream 1mg /0.81ml.  Apply topically 0.1ml topically two to three times weekly.  Disp: 3 month supply.  #1RF. 1 each 1   phentermine  (ADIPEX-P ) 37.5 MG tablet Take 1 tablet (37.5 mg total) by mouth daily before breakfast. 90 tablet 0   propranolol  ER (INDERAL  LA) 120 MG 24 hr capsule Take 1 capsule (120 mg total) by mouth at bedtime. 90 capsule 3   sertraline  (ZOLOFT ) 50 MG tablet Take 1 tablet (50 mg total) by mouth daily. 90 tablet 3   simethicone  (MYLICON) 125 MG chewable tablet Chew 125 mg by mouth in the morning and at bedtime.     SUMAtriptan  6 MG/0.5ML SOAJ INJECT 6 MG INTO THE SKIN AS NEEDED (MAY REPEAT IN ONE HOUR. MAXIMUM 2 INJECTIONS IN 24 HOURS.). 3 mL 5   traMADol  (ULTRAM ) 50 MG tablet Take 2 tablets (100 mg total) by mouth 2 (two) times daily as needed. 120 tablet 0   fluconazole  (DIFLUCAN ) 150 MG tablet Take 1 tablet now. Repeat in 72 hours if symptoms are not completely resolved. (Patient not taking: Reported on 10/10/2023) 2 tablet 0   gabapentin  (NEURONTIN ) 400 MG capsule Take 1 capsule by mouth two times daily (Patient not taking: Reported on 10/10/2023) 60 capsule 0   naloxone  (NARCAN ) nasal spray 4 mg/0.1 mL Use 1 (one) spray in a nostril for accidental overdose (Patient not  taking: Reported on 10/10/2023) 2 each 3   naloxone  (NARCAN ) nasal spray 4 mg/0.1 mL Use 1 (one) spray, as directed for accidental overdose (Patient not taking: Reported on 10/10/2023) 2 each 3   naloxone  (NARCAN ) nasal spray 4 mg/0.1 mL Use (one) spray, as directed for accidental overdose (Patient not taking: Reported on 10/10/2023) 2 each 3   Semaglutide -Weight Management (WEGOVY ) 0.25 MG/0.5ML SOAJ Inject 0.25 mg into the skin once a week. (Patient  not taking: Reported on 10/10/2023) 2 mL 0   traMADol  (ULTRAM ) 50 MG tablet take 2 tablets by mouth two times daily, as needed (Patient not taking: Reported on 10/10/2023) 120 tablet 0   [DISCONTINUED] esomeprazole  (NEXIUM ) 40 MG capsule TAKE 1 CAPSULE BY MOUTH ONCE A DAY AT 12 NOON 30 capsule 1   No current facility-administered medications on file prior to visit.     The following portions of the patient's history were reviewed and updated as appropriate: allergies, current medications, past family history, past medical history, past social history, past surgical history and problem list.  ROS Otherwise as in subjective above  Objective: BP 108/68   Pulse 75   Temp 98.5 F (36.9 C)   Wt 197 lb 9.6 oz (89.6 kg)   LMP 11/29/2015   SpO2 99%   BMI 30.95 kg/m   General appearance: alert, no distress, well developed, well nourished Lumbar surgical scar seen on exam.  Back nontender relatively normal range of motion.   Legs and specifically left knee nontender with normal range of motion no swelling or deformity. Pulses normal, no lower extremity edema Bilateral leg strength seems slightly reduced but symmetrical, DTRs blunted but otherwise normal strength and sensation of legs     Assessment: Encounter Diagnoses  Name Primary?   Chronic low back pain, unspecified back pain laterality, unspecified whether sciatica present Yes   Left leg pain    History of back surgery      Plan: We discussed her chronic pain but also her worst pain  in the last month.  She has some radicular pain symptoms and history of lumbar back surgery and chronic back pain.  We will send for an updated back x-ray at this time  She already sees pain management through [facility] and she will continue with that.  She can consider visit back to [facility] for update evaluation as well  I advised low impact exercise, regular stretching routine.  Jessly "[name]" was seen today for acute visit.  Diagnoses and all orders for this visit:  Chronic low back pain, unspecified back pain laterality, unspecified whether sciatica present -     DG Lumbar Spine Complete; Future  Left leg pain -     DG Lumbar Spine Complete; Future  History of back surgery -     DG Lumbar Spine Complete; Future    Follow up: Pending x-ray

## 2023-10-10 NOTE — Patient Instructions (Signed)
Please go to [facility] for your lumbar xray.   Their hours are 8am - 4:30 pm Monday - Friday.  Take your insurance card with you.  [facility] [phone]   [address] [place], [place] [address]

## 2023-10-14 ENCOUNTER — Encounter: Payer: Self-pay | Admitting: Radiology

## 2023-10-14 ENCOUNTER — Encounter: Payer: Self-pay | Admitting: Neurology

## 2023-10-14 NOTE — Addendum Note (Signed)
 Addended by: Colette Davies on: 10/14/2023 11:41 AM   Modules accepted: Orders

## 2023-10-15 ENCOUNTER — Ambulatory Visit
Admission: RE | Admit: 2023-10-15 | Discharge: 2023-10-15 | Disposition: A | Discharge status: Home / Self Care | Source: Ambulatory Visit | Attending: Medical | Admitting: Medical

## 2023-10-15 DIAGNOSIS — M79605 Pain in left leg: Secondary | ICD-10-CM

## 2023-10-15 DIAGNOSIS — M545 Low back pain, unspecified: Secondary | ICD-10-CM

## 2023-10-15 DIAGNOSIS — Z9889 Other specified postprocedural states: Secondary | ICD-10-CM

## 2023-10-17 NOTE — Progress Notes (Unsigned)
 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    Subjective:  Penny Thomas is a 38 year old right-handed female whom I see for migraines and primary stabbing headache, presents today for left leg pain and foot twitching.  She has history of chronic low back pain.  MRI of lumbar spine on 08/21/2021 showed shallow disc protrusion at L5-S1 potentially irritating either descending S1 nerve roots, as well as disc bulge and facet hypertrophy at L4-5 with mild left foraminal and lateral recess stenosis.  She subsequently underwent bilateral L5-S1 microdiscectomy that June.  She has been followed by the pain clinic since then.  Prior to the surgery, she would experience leg pain and numbness as well twitching in the left foot.  It improved after the surgery, occurring once or twice a week, but it has become more frequent over the past month.  It occurs spontaneously and not triggered by position.  She feels her left leg become numb and numbness and tingling in the left foot and toes.  She will then experience a sharp and intense pain from the foot radiating up the leg to her upper hip.  She is unable to bear weight on it due to pain.  It lasts about 3 to 5 minutes and occurs 3 to 4 times a week.  It tends to occur later in the afternoon.  Her PCP ordered a lumbar X-ray performed on 10/15/2023 which showed degenerative changes at L4-L5 and L5-S1.  She also notes numbness and tingling in her left hand, particularly when she is in  bed.  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   Great toe pain, left 06/16/2019   H/O abdominal supracervical subtotal hysterectomy 12/01/2019   H/O: hysterectomy 10/2019   Hemorrhage in uterus 01/20/2009   History of COVID-19 05/31/2020   History of gestational diabetes 12/02/2018   Hypertension    Migraines    Muscle cramps 03/23/2015   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     ALVESCO  160 MCG/ACT inhaler Inhale 1 puff into the lungs 2 (two) times daily. 6.1 g 5   azelastine  (OPTIVAR ) 0.05 % ophthalmic solution Place 1 drop into both eyes 2 (two) times daily. 6 mL 12   Bacillus Coagulans-Inulin (PROBIOTIC-PREBIOTIC PO) Take 1 capsule by mouth daily.     beclomethasone (QVAR ) 80 MCG/ACT inhaler Inhale 1 puff into the lungs 2 (two) times daily.     BIOTIN  PO Take 1 tablet by mouth daily.     celecoxib  (CELEBREX ) 200 MG capsule Take 1 capsule (200 mg total) by mouth 2 (two)  times daily as needed for pain. 60 capsule 0   cetirizine  (ZYRTEC ) 10 MG tablet Take 1 tablet by mouth daily. 30 tablet 5   Cholecalciferol  (D 5000) 125 MCG (5000 UT) capsule Take 1 capsule by mouth daily 30 capsule 3   cyclobenzaprine  (FLEXERIL ) 10 MG tablet Take 1 tablet by mouth 3 times per day as needed for muscle spasm 45 tablet 0   cyclobenzaprine  (FLEXERIL ) 10 MG tablet Take 1 tablet (10 mg total) by mouth 3 (three) times daily as needed for muscle spasms. 45 tablet 0   cyclobenzaprine  (FLEXERIL ) 5 MG tablet Take 1 tablet (5 mg total) by mouth 3 (three) times daily for muscle spasms. 45 tablet 0   fluconazole  (DIFLUCAN ) 150 MG tablet Take 1 tablet now. Repeat in 72 hours if symptoms are not completely resolved. (Patient not taking: Reported on 10/10/2023) 2 tablet 0   gabapentin  (NEURONTIN ) 400 MG capsule Take 1 capsule by mouth two times daily (Patient not taking: Reported on 10/10/2023) 60 capsule 0   gabapentin  (NEURONTIN ) 400 MG capsule Take 1 capsule (400 mg total) by mouth 2 (two) times daily. 60 capsule 0   metformin  (FORTAMET ) 1000 MG (OSM) 24 hr tablet Take 1 tablet (1,000 mg total) by mouth at bedtime. Take with dinner 30 tablet 5   Multiple Vitamin (MULTIVITAMIN WITH MINERALS) TABS tablet Take 1 tablet by mouth daily.     naloxone  (NARCAN ) nasal spray 4 mg/0.1 mL Use 1 (one) spray in a nostril for accidental overdose (Patient not taking: Reported on 10/10/2023) 2 each 3   naloxone  (NARCAN ) nasal spray 4 mg/0.1 mL Use 1 (one) spray, as directed for accidental overdose (Patient not taking: Reported on 10/10/2023) 2 each 3   naloxone  (NARCAN ) nasal spray 4 mg/0.1 mL Use (one) spray, as directed for accidental overdose (Patient not taking: Reported on 10/10/2023) 2 each 3   naproxen  (NAPROSYN ) 500 MG tablet Take 1 tablet (500 mg total) by mouth every 12 (twelve) hours as needed. 60 tablet 5   nitrofurantoin , macrocrystal-monohydrate, (MACROBID ) 100 MG capsule Take 1 capsule (100 mg total) by  mouth 2 (two) times daily. 10 capsule 0   NONFORMULARY OR COMPOUNDED ITEM Topical testosterone  cream 1mg /0.43ml.  Apply topically 0.1ml topically two to three times weekly.  Disp: 3 month supply.  #1RF. 1 each 1   phentermine  (ADIPEX-P ) 37.5 MG tablet Take 1 tablet (37.5 mg total) by mouth daily before breakfast. 90 tablet 0   propranolol  ER (INDERAL  LA) 120 MG 24 hr capsule Take 1 capsule (120 mg total) by mouth at bedtime. 90 capsule 3   Semaglutide -Weight Management (WEGOVY ) 0.25 MG/0.5ML SOAJ Inject 0.25 mg into the skin once a week. (Patient not taking: Reported on 10/10/2023) 2 mL 0   sertraline  (ZOLOFT ) 50 MG tablet Take 1 tablet (50 mg total) by mouth daily. 90 tablet  3   simethicone  (MYLICON) 125 MG chewable tablet Chew 125 mg by mouth in the morning and at bedtime.     SUMAtriptan  6 MG/0.5ML SOAJ INJECT 6 MG INTO THE SKIN AS NEEDED (MAY REPEAT IN ONE HOUR. MAXIMUM 2 INJECTIONS IN 24 HOURS.). 3 mL 5   traMADol  (ULTRAM ) 50 MG tablet take 2 tablets by mouth two times daily, as needed (Patient not taking: Reported on 10/10/2023) 120 tablet 0   traMADol  (ULTRAM ) 50 MG tablet Take 2 tablets (100 mg total) by mouth 2 (two) times daily as needed. 120 tablet 0   [DISCONTINUED] esomeprazole  (NEXIUM ) 40 MG capsule TAKE 1 CAPSULE BY MOUTH ONCE A DAY AT 12 NOON 30 capsule 1   No current facility-administered medications on file prior to visit.    ALLERGIES: Allergies  Allergen Reactions   [name]  [Elagolix] Other (See Comments)    Hallucinations    FAMILY HISTORY: Family History  Problem Relation Age of Onset   Fibromyalgia Mother    Multiple sclerosis Mother    Asthma Father    Healthy Maternal Grandmother    Healthy Maternal Grandfather    Healthy Paternal Grandmother    Healthy Paternal Grandfather    Endometriosis Sister    Healthy Brother    Healthy Child       Objective:  Blood pressure 107/61, pulse 94, height 5\' 7"  (1.702 m), weight 199 lb 9.6 oz (90.5 kg), last menstrual  period 11/29/2015, SpO2 99%. General: No acute distress.  Patient appears well-groomed.   Head:  Normocephalic/atraumatic Eyes:  Fundi examined but not visualized Neck: supple, no paraspinal tenderness, full range of motion Heart:  Regular rate and rhythm Back: No paraspinal tenderness Neurological Exam: alert and oriented.  Speech fluent and not dysarthric, language intact.  CN II-XII intact. Bulk and tone normal, muscle strength 5-/5 knee extension and ankle dorsiflexion, otherwise 5/5 throughout.  Sensation to pinprick reduced in left hand and left lower extremity except for medial lower leg.  Vibratory sensation slightly reduced in toes of left foot.  Tinel's sign in left wrist negative.  Deep tendon reflexes 3+ throughout except 2+ ankles.  Hoffman sign absent.  Babinski sign absent  Finger to nose testing intact.  Gait normal, Romberg negative.   Janne Members, DO  CC: Archibald Beard, NP

## 2023-10-21 ENCOUNTER — Encounter: Payer: Self-pay | Admitting: Neurology

## 2023-10-21 ENCOUNTER — Ambulatory Visit: Admitting: Neurology

## 2023-10-21 ENCOUNTER — Other Ambulatory Visit (HOSPITAL_COMMUNITY): Payer: Self-pay

## 2023-10-21 VITALS — BP 107/61 | HR 94 | Ht 67.0 in | Wt 199.6 lb

## 2023-10-21 DIAGNOSIS — G253 Myoclonus: Secondary | ICD-10-CM

## 2023-10-21 DIAGNOSIS — G5602 Carpal tunnel syndrome, left upper limb: Secondary | ICD-10-CM | POA: Diagnosis not present

## 2023-10-21 DIAGNOSIS — R29898 Other symptoms and signs involving the musculoskeletal system: Secondary | ICD-10-CM

## 2023-10-21 DIAGNOSIS — M5416 Radiculopathy, lumbar region: Secondary | ICD-10-CM | POA: Diagnosis not present

## 2023-10-21 DIAGNOSIS — R292 Abnormal reflex: Secondary | ICD-10-CM

## 2023-10-21 MED ORDER — WRIST BRACE//LEFT LARGE MISC
0 refills | Status: AC
  Filled 2023-10-21: qty 1, 1d supply, fill #0
  Filled 2023-11-07: qty 1, fill #0

## 2023-10-21 NOTE — Patient Instructions (Addendum)
 Wear a wrist splint at night on the left wrist MRI of thoracic and lumbar spine without contrast. We have sent a referral to [facility] for your MRI and they will call you directly to schedule your appointment. They are located at [address] [facility]. If you need to contact them directly please call [phone].  Move up follow up to 6 months

## 2023-10-22 ENCOUNTER — Ambulatory Visit: Payer: Self-pay | Admitting: Medical

## 2023-10-22 NOTE — Progress Notes (Signed)
 Results sent through MyChart

## 2023-10-23 ENCOUNTER — Ambulatory Visit: Admitting: Orthopedic Surgery

## 2023-10-23 ENCOUNTER — Other Ambulatory Visit (HOSPITAL_COMMUNITY): Payer: Self-pay

## 2023-10-29 ENCOUNTER — Encounter: Payer: Self-pay | Admitting: Neurology

## 2023-11-04 ENCOUNTER — Ambulatory Visit
Admission: RE | Admit: 2023-11-04 | Discharge: 2023-11-04 | Disposition: A | Discharge status: Home / Self Care | Source: Ambulatory Visit | Attending: Neurology

## 2023-11-04 ENCOUNTER — Ambulatory Visit
Admission: RE | Admit: 2023-11-04 | Discharge: 2023-11-04 | Disposition: A | Discharge status: Home / Self Care | Source: Ambulatory Visit | Attending: Neurology | Admitting: Neurology

## 2023-11-04 DIAGNOSIS — M5416 Radiculopathy, lumbar region: Secondary | ICD-10-CM

## 2023-11-04 DIAGNOSIS — G253 Myoclonus: Secondary | ICD-10-CM

## 2023-11-04 DIAGNOSIS — R292 Abnormal reflex: Secondary | ICD-10-CM

## 2023-11-04 DIAGNOSIS — R29898 Other symptoms and signs involving the musculoskeletal system: Secondary | ICD-10-CM

## 2023-11-07 ENCOUNTER — Other Ambulatory Visit (HOSPITAL_COMMUNITY): Payer: Self-pay

## 2023-11-07 MED ORDER — CYCLOBENZAPRINE HCL 5 MG PO TABS
5.0000 mg | ORAL_TABLET | Freq: Three times a day (TID) | ORAL | 0 refills | Status: DC | PRN
  Filled 2023-11-07: qty 45, 15d supply, fill #0

## 2023-11-07 MED ORDER — IBUPROFEN 800 MG PO TABS
800.0000 mg | ORAL_TABLET | Freq: Three times a day (TID) | ORAL | 0 refills | Status: AC
  Filled 2023-11-07: qty 21, 7d supply, fill #0

## 2023-11-07 MED ORDER — GABAPENTIN 400 MG PO CAPS
400.0000 mg | ORAL_CAPSULE | Freq: Two times a day (BID) | ORAL | 0 refills | Status: DC
  Filled 2023-11-07: qty 60, 30d supply, fill #0

## 2023-11-07 MED ORDER — NALOXONE HCL 4 MG/0.1ML NA LIQD
1.0000 | NASAL | 3 refills | Status: DC | PRN
  Filled 2023-11-07: qty 2, 30d supply, fill #0

## 2023-11-07 MED ORDER — HYDROCODONE-ACETAMINOPHEN 7.5-325 MG PO TABS
1.0000 | ORAL_TABLET | Freq: Three times a day (TID) | ORAL | 0 refills | Status: DC | PRN
  Filled 2023-11-07: qty 15, 5d supply, fill #0

## 2023-11-07 NOTE — Telephone Encounter (Signed)
 Per Dr.Jaffe, note I don't know unless I know what is causing it. Due to symptoms in the arm as well, I would like to check MRI of the cervical spine for left arm weakness, left leg weakness, myoclonus   MRI Cervical spine ordered.

## 2023-11-10 ENCOUNTER — Other Ambulatory Visit (HOSPITAL_COMMUNITY): Payer: Self-pay

## 2023-11-12 ENCOUNTER — Other Ambulatory Visit: Payer: Self-pay

## 2023-11-12 ENCOUNTER — Other Ambulatory Visit (HOSPITAL_COMMUNITY): Payer: Self-pay

## 2023-11-13 ENCOUNTER — Other Ambulatory Visit (HOSPITAL_COMMUNITY): Payer: Self-pay

## 2023-11-14 ENCOUNTER — Encounter: Payer: Self-pay | Admitting: Neurology

## 2023-11-17 ENCOUNTER — Other Ambulatory Visit (HOSPITAL_COMMUNITY): Payer: Self-pay

## 2023-11-17 ENCOUNTER — Other Ambulatory Visit: Payer: Self-pay

## 2023-11-17 ENCOUNTER — Ambulatory Visit
Admission: RE | Admit: 2023-11-17 | Discharge: 2023-11-17 | Discharge status: Home / Self Care | Source: Ambulatory Visit | Attending: Neurology

## 2023-11-17 ENCOUNTER — Other Ambulatory Visit: Payer: Self-pay | Admitting: Nurse Practitioner

## 2023-11-17 DIAGNOSIS — G253 Myoclonus: Secondary | ICD-10-CM

## 2023-11-17 DIAGNOSIS — Z6831 Body mass index (BMI) 31.0-31.9, adult: Secondary | ICD-10-CM

## 2023-11-17 DIAGNOSIS — N3090 Cystitis, unspecified without hematuria: Secondary | ICD-10-CM

## 2023-11-17 DIAGNOSIS — R29898 Other symptoms and signs involving the musculoskeletal system: Secondary | ICD-10-CM

## 2023-11-17 MED ORDER — FLUCONAZOLE 150 MG PO TABS
ORAL_TABLET | ORAL | 0 refills | Status: DC
  Filled 2023-11-17: qty 2, 3d supply, fill #0

## 2023-11-17 MED ORDER — PHENTERMINE HCL 37.5 MG PO TABS
37.5000 mg | ORAL_TABLET | Freq: Every day | ORAL | 0 refills | Status: DC
  Filled 2023-11-17 – 2023-12-05 (×3): qty 90, 90d supply, fill #0

## 2023-11-17 MED ORDER — HYDROCODONE-ACETAMINOPHEN 10-325 MG PO TABS
1.0000 | ORAL_TABLET | Freq: Three times a day (TID) | ORAL | 0 refills | Status: DC | PRN
  Filled 2023-11-17: qty 15, 5d supply, fill #0

## 2023-11-17 MED ORDER — NALOXONE HCL 4 MG/0.1ML NA LIQD
1.0000 | NASAL | 3 refills | Status: DC
  Filled 2023-11-17: qty 2, 30d supply, fill #0

## 2023-11-17 NOTE — Telephone Encounter (Signed)
 Last apt 09/08/23.

## 2023-11-18 ENCOUNTER — Other Ambulatory Visit (HOSPITAL_COMMUNITY): Payer: Self-pay

## 2023-11-20 ENCOUNTER — Encounter: Payer: Self-pay | Admitting: Neurology

## 2023-11-25 ENCOUNTER — Other Ambulatory Visit (HOSPITAL_COMMUNITY): Payer: Self-pay

## 2023-11-25 MED ORDER — NALOXONE HCL 4 MG/0.1ML NA LIQD
NASAL | 3 refills | Status: DC
  Filled 2023-11-25: qty 2, 1d supply, fill #0

## 2023-11-25 MED ORDER — DICLOFENAC SODIUM 75 MG PO TBEC
75.0000 mg | DELAYED_RELEASE_TABLET | Freq: Two times a day (BID) | ORAL | 0 refills | Status: DC
  Filled 2023-11-25: qty 60, 30d supply, fill #0

## 2023-11-25 MED ORDER — HYDROCODONE-ACETAMINOPHEN 10-325 MG PO TABS
1.0000 | ORAL_TABLET | Freq: Two times a day (BID) | ORAL | 0 refills | Status: DC
  Filled 2023-11-25: qty 60, 30d supply, fill #0

## 2023-11-26 ENCOUNTER — Other Ambulatory Visit (HOSPITAL_COMMUNITY): Payer: Self-pay

## 2023-12-05 ENCOUNTER — Other Ambulatory Visit (HOSPITAL_COMMUNITY): Payer: Self-pay

## 2023-12-05 ENCOUNTER — Other Ambulatory Visit: Payer: Self-pay

## 2023-12-05 ENCOUNTER — Telehealth (HOSPITAL_COMMUNITY): Payer: Self-pay

## 2023-12-08 ENCOUNTER — Other Ambulatory Visit (HOSPITAL_COMMUNITY): Payer: Self-pay

## 2023-12-08 MED ORDER — CELECOXIB 200 MG PO CAPS
200.0000 mg | ORAL_CAPSULE | Freq: Two times a day (BID) | ORAL | 0 refills | Status: DC | PRN
  Filled 2023-12-08: qty 60, 30d supply, fill #0

## 2023-12-12 ENCOUNTER — Other Ambulatory Visit (HOSPITAL_COMMUNITY): Payer: Self-pay

## 2023-12-12 ENCOUNTER — Encounter: Payer: Self-pay | Admitting: Nurse Practitioner

## 2023-12-15 ENCOUNTER — Other Ambulatory Visit (HOSPITAL_COMMUNITY): Payer: Self-pay

## 2023-12-15 ENCOUNTER — Encounter: Payer: Self-pay | Admitting: Obstetrics and Gynecology

## 2023-12-15 MED ORDER — GABAPENTIN 400 MG PO CAPS
400.0000 mg | ORAL_CAPSULE | Freq: Two times a day (BID) | ORAL | 0 refills | Status: DC
  Filled 2023-12-15: qty 60, 30d supply, fill #0

## 2023-12-24 ENCOUNTER — Encounter: Payer: Self-pay | Admitting: Obstetrics and Gynecology

## 2023-12-26 ENCOUNTER — Other Ambulatory Visit (HOSPITAL_COMMUNITY): Payer: Self-pay

## 2023-12-26 MED ORDER — HYDROCODONE-ACETAMINOPHEN 10-325 MG PO TABS
1.0000 | ORAL_TABLET | Freq: Two times a day (BID) | ORAL | 0 refills | Status: DC
  Filled 2023-12-26: qty 60, 30d supply, fill #0

## 2023-12-26 MED ORDER — NALOXONE HCL 4 MG/0.1ML NA LIQD
1.0000 | NASAL | 3 refills | Status: AC
  Filled 2023-12-26: qty 2, 30d supply, fill #0

## 2023-12-26 MED ORDER — CELECOXIB 200 MG PO CAPS
200.0000 mg | ORAL_CAPSULE | Freq: Every evening | ORAL | 0 refills | Status: DC
  Filled 2023-12-26 – 2024-01-14 (×2): qty 30, 30d supply, fill #0

## 2023-12-26 MED ORDER — DICLOFENAC SODIUM 75 MG PO TBEC
75.0000 mg | DELAYED_RELEASE_TABLET | Freq: Two times a day (BID) | ORAL | 0 refills | Status: DC
  Filled 2023-12-26: qty 60, 30d supply, fill #0

## 2024-01-10 ENCOUNTER — Encounter: Payer: Self-pay | Admitting: Nurse Practitioner

## 2024-01-13 ENCOUNTER — Encounter: Payer: Self-pay | Admitting: Nurse Practitioner

## 2024-01-13 ENCOUNTER — Other Ambulatory Visit: Payer: Self-pay

## 2024-01-13 DIAGNOSIS — R7303 Prediabetes: Secondary | ICD-10-CM

## 2024-01-13 DIAGNOSIS — E782 Mixed hyperlipidemia: Secondary | ICD-10-CM

## 2024-01-14 ENCOUNTER — Other Ambulatory Visit (HOSPITAL_COMMUNITY): Payer: Self-pay

## 2024-01-15 ENCOUNTER — Encounter (HOSPITAL_COMMUNITY): Payer: Self-pay

## 2024-01-15 ENCOUNTER — Other Ambulatory Visit (HOSPITAL_COMMUNITY): Payer: Self-pay

## 2024-01-16 ENCOUNTER — Other Ambulatory Visit (HOSPITAL_COMMUNITY): Payer: Self-pay

## 2024-01-16 MED ORDER — GABAPENTIN 400 MG PO CAPS
400.0000 mg | ORAL_CAPSULE | Freq: Two times a day (BID) | ORAL | 0 refills | Status: DC
  Filled 2024-01-16: qty 60, 30d supply, fill #0

## 2024-01-28 ENCOUNTER — Other Ambulatory Visit (HOSPITAL_COMMUNITY): Payer: Self-pay

## 2024-01-28 ENCOUNTER — Encounter: Payer: Self-pay | Admitting: Neurology

## 2024-01-28 MED ORDER — HYDROCODONE-ACETAMINOPHEN 10-325 MG PO TABS
1.0000 | ORAL_TABLET | Freq: Two times a day (BID) | ORAL | 0 refills | Status: DC
  Filled 2024-01-28 – 2024-02-10 (×2): qty 60, 30d supply, fill #0

## 2024-01-28 MED ORDER — GABAPENTIN 400 MG PO CAPS
400.0000 mg | ORAL_CAPSULE | Freq: Two times a day (BID) | ORAL | 0 refills | Status: DC
  Filled 2024-01-28 – 2024-02-10 (×2): qty 60, 30d supply, fill #0

## 2024-01-28 MED ORDER — CELECOXIB 200 MG PO CAPS
200.0000 mg | ORAL_CAPSULE | Freq: Every evening | ORAL | 0 refills | Status: DC
  Filled 2024-01-28 – 2024-02-10 (×2): qty 30, 30d supply, fill #0

## 2024-01-30 ENCOUNTER — Encounter: Payer: Self-pay | Admitting: Neurology

## 2024-02-02 ENCOUNTER — Other Ambulatory Visit: Payer: Self-pay | Admitting: Medical

## 2024-02-09 ENCOUNTER — Other Ambulatory Visit (HOSPITAL_COMMUNITY): Payer: Self-pay

## 2024-02-09 NOTE — Progress Notes (Unsigned)
 Catheline Doing, DNP, AGNP-c [facility] [address] [place], [place] [address] Main Office [phone] VISIT TYPE: CPE on 02/10/2024 Today's Vitals   02/10/24 0925  BP: 124/80  Pulse: 74  Weight: 200 lb (90.7 kg)  Height: 5' 7 (1.702 m)   Body mass index is 31.32 kg/m. BP 124/80   Pulse 74   Ht 5' 7 (1.702 m)   Wt 200 lb (90.7 kg)   LMP 11/29/2015   BMI 31.32 kg/m   Subjective:    Patient ID: Penny Thomas, female    DOB: [birth year], 38 y.o.   MRN: [id number]  HPI: History of Present Illness Penny Thomas is a 38 year old female who presents for her annual exam.   We briefly discussed her left sided weakness and changes noted with this. She has a history of back issues with a recent MRI showing narrowing of the spinal canal and small bulging discs in the lower and upper back. She experiences left-sided weakness, particularly in her leg and hand, with worsening grip strength. The leg symptoms include tingling, numbness, and weakness that start from the top of the leg and radiate down to the foot, causing her to hold onto objects for support. These episodes occur randomly, including at work, and have been worsening over the past three months.  She has been referred between various specialists including neurology, pain management, and back specialists, but has not received a definitive diagnosis. Her previous back doctor retired, and her current back doctor has not recommended surgery or intervention outside of pain management. Her pain management doctor feels this is something the back surgeon should address. Her neurologist (primarily sees her for migraines) did order the MRI, but he is not sure what the cause of her symptoms are. 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  She also has a history of migraines, which are  currently under control. No chest pain or palpitations, but she notes some difficulty swallowing at the base of her throat. She has a history of a hysterectomy due to tumors, which were discovered after a prolonged period of pain.  Her current medications include a stool softener, vitamin D  supplements, and hydroxyzine  for allergies. She occasionally uses cyclobenzaprine  5 mg as needed for muscle relaxation.  She mentions a recent weight gain and has a consultation scheduled with a nutritionist. Her physical activity is limited due to her symptoms, but she uses a stepper and vibrating plate at home for exercise. She notes increased urination after using the vibrating plate.  Pertinent items are noted in HPI.  Most Recent Depression Screen:     02/10/2024    9:24 AM 09/08/2023    3:19 PM 02/05/2023    8:41 AM 10/24/2021    2:35 PM 09/13/2020    8:58 AM  Depression screen PHQ 2/9  Decreased Interest 0 0 0 0 0  Down, Depressed, Hopeless 0 0 0 0 0  PHQ - 2 Score 0 0 0 0 0  Altered sleeping    0   Tired, decreased energy    0   Change in appetite    0   Feeling bad or failure about yourself     0   Trouble concentrating    0   Moving slowly or fidgety/restless    0   Suicidal thoughts    0   PHQ-9 Score  0   Difficult doing work/chores    Not difficult at all    Most Recent Anxiety Screen:     06/16/2019    9:02 AM  GAD 7 : Generalized Anxiety Score  Nervous, Anxious, on Edge 2  Control/stop worrying 0  Worry too much - different things 0  Trouble relaxing 0  Restless 0  Easily annoyed or irritable 1  Afraid - awful might happen 1  Total GAD 7 Score 4  Anxiety Difficulty Not difficult at all   Most Recent Fall Screen:    02/10/2024    9:24 AM 10/21/2023    3:05 PM 09/08/2023    3:19 PM 06/09/2023    3:03 PM 02/05/2023    8:41 AM  Fall Risk   Falls in the past year? 0 1 0 0 0  Number falls in past yr: 0 0 0 0 0  Injury with Fall? 0 0 0 0 0  Risk for fall due to : No Fall  Risks  No Fall Risks  No Fall Risks  Follow up Falls evaluation completed Falls evaluation completed Falls evaluation completed Falls evaluation completed Falls evaluation completed    Past medical history, surgical history, medications, allergies, family history and social history reviewed with patient today and changes made to appropriate areas of the chart.  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   Great toe pain, left 06/16/2019   H/O abdominal supracervical subtotal hysterectomy 12/01/2019   H/O: hysterectomy 10/2019   Hemorrhage in uterus 01/20/2009   History of COVID-19 05/31/2020   History of gestational diabetes 12/02/2018   Hypertension    Migraines    Muscle cramps 03/23/2015   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   ALVESCO  160 MCG/ACT inhaler Inhale 1 puff into the lungs 2 (two) times daily.   azelastine  (OPTIVAR ) 0.05 % ophthalmic solution Place 1 drop into both eyes 2 (two) times daily.   Bacillus Coagulans-Inulin (PROBIOTIC-PREBIOTIC PO) Take 1 capsule by mouth daily.   beclomethasone (QVAR )  80 MCG/ACT inhaler Inhale 1 puff into the lungs 2 (two) times daily.   BIOTIN  PO Take 1 tablet by mouth daily.   celecoxib  (CELEBREX ) 200 MG capsule Take 1 capsule (200 mg total) by mouth at bedtime.   cetirizine  (ZYRTEC ) 10 MG tablet Take 1 tablet by mouth daily.   Cholecalciferol  (D 5000) 125 MCG (5000 UT) capsule Take 1 capsule by mouth daily   Elastic Bandages & Supports (WRIST BRACE//LEFT LARGE) MISC Wear a wrist splint at  night on the left wrist   gabapentin  (NEURONTIN ) 400 MG capsule Take 1 capsule (400 mg total) by mouth 2 (two) times daily.   HYDROcodone -acetaminophen  (NORCO) 10-325 MG tablet Take 1 tablet by mouth 2 (two) times daily.   ibuprofen  (ADVIL ) 800 MG tablet Take 1 tablet (800 mg total) by mouth 3 (three) times daily.   Multiple Vitamin (MULTIVITAMIN WITH MINERALS) TABS tablet Take 1 tablet by mouth daily.   naloxone  (NARCAN ) nasal spray 4 mg/0.1 mL Place 1 spray into the nose for accidental overdose   naproxen  (NAPROSYN ) 500 MG tablet Take 1 tablet (500 mg total) by mouth every 12 (twelve) hours as needed.   phentermine  (ADIPEX-P ) 37.5 MG tablet Take 1 tablet (37.5 mg total) by mouth daily before breakfast.   propranolol  ER (INDERAL  LA) 120 MG 24 hr capsule Take 1 capsule (120 mg total) by mouth at bedtime.   sertraline  (ZOLOFT ) 50 MG tablet Take 1 tablet (50 mg total) by mouth daily.   simethicone  (MYLICON) 125 MG chewable tablet Chew 125 mg by mouth in the morning and at bedtime.   SUMAtriptan  6 MG/0.5ML SOAJ INJECT 6 MG INTO THE SKIN AS NEEDED (MAY REPEAT IN ONE HOUR. MAXIMUM 2 INJECTIONS IN 24 HOURS.).   traMADol  (ULTRAM ) 50 MG tablet Take 2 tablets (100 mg total) by mouth 2 (two) times daily as needed.   diclofenac  (VOLTAREN ) 75 MG EC tablet Take 1 tablet (75 mg total) by mouth 2 (two) times daily. (Patient not taking: Reported on 02/10/2024)   HYDROcodone -acetaminophen  (NORCO) 7.5-325 MG tablet Take 1 tablet by mouth 3 (three) times daily as needed. (Patient not taking:  Reported on 02/10/2024)   [DISCONTINUED] esomeprazole  (NEXIUM ) 40 MG capsule TAKE 1 CAPSULE BY MOUTH ONCE A DAY AT 12 NOON   No current facility-administered medications on file prior to visit.   Surgical History:  Past Surgical History:  Procedure Laterality Date   ABDOMINAL HYSTERECTOMY     CESAREAN SECTION  09/28/2005   CESAREAN SECTION  01/20/2009   CESAREAN SECTION     DIAGNOSTIC LAPAROSCOPY     ENDOMETRIAL ABLATION  10/2011   HEROPTIOIN   HERNIA REPAIR     bilateral groins as a child   KNEE ARTHROSCOPY Right 02/16/2014   Procedure: RIGHT ARTHROSCOPY KNEE;  Surgeon: Dempsey JINNY Sensor, MD;  Location: [facility];  Service: Orthopedics;  Laterality: Right;   LUMBAR LAMINECTOMY/DECOMPRESSION MICRODISCECTOMY N/A 11/12/2021   Procedure: BILATERAL LUMBAR FIVE - SACRAL ONE MICRODISCECTOMY;  Surgeon: Lucilla Lynwood BRAVO, MD;  Location: [facility] OR;  Service: Orthopedics;  Laterality: N/A;   POLYP REMOVAL  09/2006   ?CERVICAL OR UTERINE POLYP   ROBOTIC ASSISTED LAPAROSCOPIC HYSTERECTOMY AND SALPINGECTOMY Bilateral 12/01/2019   Procedure: XI ROBOTIC ASSISTEDTOTAL TOTAL LAPAROSCOPIC HYSTERECTOMY AND RIGHT SALPINGECTOMY , EXTENSIVE LYSIS OF ADHESIONS;  Surgeon: Lavoie, Marie-Lyne, MD;  Location: [facility];  Service: Gynecology;  Laterality: Bilateral;  request 8:30am OR time. Requests 2 hours   TUBAL LIGATION  01/20/2009   DONE WITH C-SECTION   WISDOM TOOTH EXTRACTION Right 2014   Allergies:  Allergies  Allergen Reactions   [name]  [Elagolix] Other (See Comments)    Hallucinations   Family History:  Family History  Problem Relation Age of Onset   Fibromyalgia Mother    Multiple sclerosis Mother    Asthma Father    Healthy Maternal Grandmother    Healthy Maternal Grandfather    Healthy Paternal Grandmother    Healthy Paternal Grandfather    Endometriosis Sister  Healthy Brother    Healthy Child        Objective:    BP 124/80   Pulse 74   Ht 5' 7  (1.702 m)   Wt 200 lb (90.7 kg)   LMP 11/29/2015   BMI 31.32 kg/m   Wt Readings from Last 3 Encounters:  02/10/24 200 lb (90.7 kg)  10/21/23 199 lb 9.6 oz (90.5 kg)  10/10/23 197 lb 9.6 oz (89.6 kg)    Physical Exam Vitals and nursing note reviewed.  Constitutional:      General: She is not in acute distress.    Appearance: Normal appearance.  HENT:     Head: Normocephalic and atraumatic.     Right Ear: Hearing, tympanic membrane, ear canal and external ear normal.     Left Ear: Hearing, tympanic membrane, ear canal and external ear normal.     Nose: Nose normal.     Right Sinus: No maxillary sinus tenderness or frontal sinus tenderness.     Left Sinus: No maxillary sinus tenderness or frontal sinus tenderness.     Mouth/Throat:     Lips: Pink.     Mouth: Mucous membranes are moist.     Pharynx: Oropharynx is clear.  Eyes:     General: Lids are normal. Vision grossly intact.     Extraocular Movements: Extraocular movements intact.     Conjunctiva/sclera: Conjunctivae normal.     Pupils: Pupils are equal, round, and reactive to light.     Funduscopic exam:    Right eye: Red reflex present.        Left eye: Red reflex present.    Visual Fields: Right eye visual fields normal and left eye visual fields normal.  Neck:     Thyroid : No thyromegaly.     Vascular: No carotid bruit.  Cardiovascular:     Rate and Rhythm: Normal rate and regular rhythm.     Chest Wall: PMI is not displaced.     Pulses: Normal pulses.          Dorsalis pedis pulses are 2+ on the right side and 2+ on the left side.       Posterior tibial pulses are 2+ on the right side and 2+ on the left side.     Heart sounds: Normal heart sounds. No murmur heard. Pulmonary:     Effort: Pulmonary effort is normal. No respiratory distress.     Breath sounds: Normal breath sounds.  Abdominal:     General: Abdomen is flat. Bowel sounds are normal. There is no distension.     Palpations: Abdomen is soft. There is no  hepatomegaly, splenomegaly or mass.     Tenderness: There is no abdominal tenderness. There is no right CVA tenderness, left CVA tenderness, guarding or rebound.  Musculoskeletal:        General: Tenderness present. No swelling. Normal range of motion.     Cervical back: Full passive range of motion without pain, normal range of motion and neck supple. No tenderness.     Right lower leg: No edema.     Left lower leg: No edema.     Comments: Paresthesia's in the left arm and left leg with chronic back pain.   Feet:     Left foot:     Toenail Condition: Left toenails are normal.  Lymphadenopathy:     Cervical: No cervical adenopathy.     Upper Body:     Right upper body: No supraclavicular adenopathy.  Left upper body: No supraclavicular adenopathy.  Skin:    General: Skin is warm and dry.     Capillary Refill: Capillary refill takes less than 2 seconds.     Nails: There is no clubbing.  Neurological:     General: No focal deficit present.     Mental Status: She is alert and oriented to person, place, and time.     GCS: GCS eye subscore is 4. GCS verbal subscore is 5. GCS motor subscore is 6.     Sensory: Sensory deficit present.     Motor: Weakness present.     Coordination: Coordination is intact. Coordination normal.     Gait: Gait abnormal.     Deep Tendon Reflexes: Reflexes are normal and symmetric.     Comments: Left sided arm and leg weakness. Grip strength on left significantly less than right.   Psychiatric:        Attention and Perception: Attention normal.        Mood and Affect: Mood normal.        Speech: Speech normal.        Behavior: Behavior normal. Behavior is cooperative.        Thought Content: Thought content normal.        Cognition and Memory: Cognition and memory normal.        Judgment: Judgment normal.      Results for orders placed or performed in visit on 09/09/23  POCT URINALYSIS DIP (CLINITEK)   Collection Time: 09/10/23  7:49 AM  Result  Value Ref Range   Color, UA yellow yellow   Clarity, UA cloudy (A) clear   Glucose, UA negative negative mg/dL   Bilirubin, UA negative (A) negative   Ketones, POC UA negative negative mg/dL   Spec Grav, UA 8.989 8.989 - 1.025   Blood, UA large (A) negative   pH, UA 6.0 5.0 - 8.0   POC PROTEIN,UA trace negative, trace   Urobilinogen, UA 0.2 0.2 or 1.0 E.U./dL   Nitrite, UA Positive (A) Negative   Leukocytes, UA Negative Negative  Urine Culture   Collection Time: 09/10/23  8:07 AM   Specimen: Urine   UR  Result Value Ref Range   Urine Culture, Routine Final report (A)    Organism ID, Bacteria Escherichia coli (A)    Antimicrobial Susceptibility Comment    *Note: Due to a large number of results and/or encounters for the requested time period, some results have not been displayed. A complete set of results can be found in Results Review.       Assessment & Plan:   Problem List Items Addressed This Visit     Encounter for annual physical exam - Primary   CPE completed today. Review of HM activities and recommendations discussed and provided on AVS. Anticipatory guidance, diet, and exercise recommendations provided. Medications, allergies, and hx reviewed and updated as necessary. Orders placed as listed below.  Plan: - Labs ordered. Will make changes as necessary based on results.  - I will review these results and send recommendations via MyChart or a telephone call.  - F/U with CPE in 1 year or sooner for acute/chronic health needs as directed.        BMI 31.0-31.9,adult   Weight management is a concern, with current weight at approximately 200 pounds. Limited mobility due to back and leg issues may contribute to difficulty in weight management. - Continue with scheduled consultation with a nutritionist on October 1st to address dietary  habits and weight management.      Relevant Orders   CBC with Differential/Platelet   CMP14+EGFR   Hemoglobin A1c   Lipid panel    Environmental allergies   Seasonal allergic rhinitis with previous use of hydroxyzine  for severe allergy symptoms prescribed by allergist. Benadryl  causes excessive drowsiness. She requests a refill for the hydroxyzine  today.  - Prescribe hydroxyzine  for allergy management.      Relevant Medications   hydrOXYzine  (VISTARIL ) 25 MG capsule   Left-sided weakness   Chronic left-sided weakness and numbness involving the leg and hand, with a history of spinal surgery. Symptoms include tingling, numbness, and weakness, particularly in the left leg, which worsens with movement. MRI showed small bulging discs in the lower and upper back, but no significant compression. Neurology, neurosurgery, and pain management have not identified a clear cause. Differential diagnosis includes nerve compression or other neurological conditions. Symptoms have been worsening over the past three months. She is frustrated with the lack of diagnosis and is open to seeing another specialist. It does not appear that any imaging of the head has been completed.  - Refer to a new spine specialist for a second opinion. - Consider referral for brain imaging and nerve conduction studies. - Coordinate with Ludie to facilitate referrals and gather additional opinions.      Prediabetes   Relevant Orders   CBC with Differential/Platelet   CMP14+EGFR   Hemoglobin A1c   Lipid panel   Mixed hyperlipidemia   Relevant Orders   CBC with Differential/Platelet   CMP14+EGFR   Hemoglobin A1c   Lipid panel   Other Visit Diagnoses       Need for vaccination against Streptococcus pneumoniae       Relevant Orders   Pneumococcal conjugate vaccine 20-valent (Prevnar 20) (Completed)     Need for HPV vaccination       Relevant Orders   HPV 9-valent vaccine,Recombinat (Completed)     Screening for endocrine, nutritional, metabolic and immunity disorder       Relevant Orders   CBC with Differential/Platelet   CMP14+EGFR   Hemoglobin A1c    Lipid panel      Follow up plan: Return in about 1 year (around 02/09/2025) for CPE.  NEXT PREVENTATIVE PHYSICAL DUE IN 1 YEAR.  PATIENT COUNSELING PROVIDED FOR ALL ADULT PATIENTS: A well balanced diet low in saturated fats, cholesterol, and moderation in carbohydrates.  This can be as simple as monitoring portion sizes and cutting back on sugary beverages such as soda and juice to start with.    Daily water consumption of at least 64 ounces.  Physical activity at least 180 minutes per week.  If just starting out, start 10 minutes a day and work your way up.   This can be as simple as taking the stairs instead of the elevator and walking 2-3 laps around the office  purposefully every day.   STD protection, partner selection, and regular testing if high risk.  Limited consumption of alcoholic beverages if alcohol is consumed. For men, I recommend no more than 14 alcoholic beverages per week, spread out throughout the week (max 2 per day). Avoid binge drinking or consuming large quantities of alcohol in one setting.  Please let me know if you feel you may need help with reduction or quitting alcohol consumption.   Avoidance of nicotine, if used. Please let me know if you feel you may need help with reduction or quitting nicotine use.   Daily mental health  attention. This can be in the form of 5 minute daily meditation, prayer, journaling, yoga, reflection, etc.  Purposeful attention to your emotions and mental state can significantly improve your overall wellbeing  and  Health.  Please know that I am here to help you with all of your health care goals and am happy to work with you to find a solution that works best for you.  The greatest advice I have received with any changes in life are to take it one step at a time, that even means if all you can focus on is the next 60 seconds, then do that and celebrate your victories.  With any changes in life, you will have set backs, and  that is OK. The important thing to remember is, if you have a set back, it is not a failure, it is an opportunity to try again! Screening Testing Mammogram Every 1 -2 years based on history and risk factors Starting at age 75 Pap Smear Ages 21-39 every 3 years Ages 102-65 every 5 years with HPV testing More frequent testing may be required based on results and history Colon Cancer Screening Every 1-10 years based on test performed, risk factors, and history Starting at age 18 Bone Density Screening Every 2-10 years based on history Starting at age 15 for women Recommendations for men differ based on medication usage, history, and risk factors AAA Screening One time ultrasound Men 36-31 years old who have every smoked Lung Cancer Screening Low Dose Lung CT every 12 months Age 66-80 years with a 30 pack-year smoking history who still smoke or who have quit within the last 15 years   Screening Labs Routine  Labs: Complete Blood Count (CBC), Complete Metabolic Panel (CMP), Cholesterol (Lipid Panel) Every 6-12 months based on history and medications May be recommended more frequently based on current conditions or previous results Hemoglobin A1c Lab Every 3-12 months based on history and previous results Starting at age 68 or earlier with diagnosis of diabetes, high cholesterol, BMI >26, and/or risk factors Frequent monitoring for patients with diabetes to ensure blood sugar control Thyroid  Panel (TSH) Every 6 months based on history, symptoms, and risk factors May be repeated more often if on medication HIV One time testing for all patients 68 and older May be repeated more frequently for patients with increased risk factors or exposure Hepatitis C One time testing for all patients 61 and older May be repeated more frequently for patients with increased risk factors or exposure Gonorrhea, Chlamydia Every 12 months for all sexually active persons 13-24 years Additional monitoring  may be recommended for those who are considered high risk or who have symptoms Every 12 months for any woman on birth control, regardless of sexual activity PSA Men 45-65 years old with risk factors Additional screening may be recommended from age 58-69 based on risk factors, symptoms, and history  Vaccine Recommendations Tetanus Booster All adults every 10 years Flu Vaccine All patients 6 months and older every year COVID Vaccine All patients 12 years and older Initial dosing with booster May recommend additional booster based on age and health history HPV Vaccine 2 doses all patients age 15-26 Dosing may be considered for patients over 26 Shingles Vaccine (Shingrix) 2 doses all adults 55 years and older Pneumonia (Pneumovax 49) All adults 65 years and older May recommend earlier dosing based on health history One year apart from Prevnar 34 Pneumonia (Prevnar 26) All adults 65 years and older Dosed 1 year after Pneumovax  23 Pneumonia (Prevnar 20) One time alternative to the two dosing of 13 and 23 For all adults with initial dose of 23, 20 is recommended 1 year later For all adults with initial dose of 13, 23 is still recommended as second option 1 year later

## 2024-02-10 ENCOUNTER — Other Ambulatory Visit: Payer: Self-pay

## 2024-02-10 ENCOUNTER — Other Ambulatory Visit (HOSPITAL_COMMUNITY): Payer: Self-pay

## 2024-02-10 ENCOUNTER — Ambulatory Visit (INDEPENDENT_AMBULATORY_CARE_PROVIDER_SITE_OTHER): Payer: BC Managed Care – PPO | Admitting: Nurse Practitioner

## 2024-02-10 ENCOUNTER — Other Ambulatory Visit: Payer: Self-pay | Admitting: Medical

## 2024-02-10 VITALS — BP 124/80 | HR 74 | Ht 67.0 in | Wt 200.0 lb

## 2024-02-10 DIAGNOSIS — Z1321 Encounter for screening for nutritional disorder: Secondary | ICD-10-CM

## 2024-02-10 DIAGNOSIS — Z6831 Body mass index (BMI) 31.0-31.9, adult: Secondary | ICD-10-CM

## 2024-02-10 DIAGNOSIS — Z23 Encounter for immunization: Secondary | ICD-10-CM

## 2024-02-10 DIAGNOSIS — R7303 Prediabetes: Secondary | ICD-10-CM

## 2024-02-10 DIAGNOSIS — E782 Mixed hyperlipidemia: Secondary | ICD-10-CM

## 2024-02-10 DIAGNOSIS — Z0000 Encounter for general adult medical examination without abnormal findings: Secondary | ICD-10-CM

## 2024-02-10 DIAGNOSIS — Z1329 Encounter for screening for other suspected endocrine disorder: Secondary | ICD-10-CM | POA: Diagnosis not present

## 2024-02-10 DIAGNOSIS — R531 Weakness: Secondary | ICD-10-CM | POA: Insufficient documentation

## 2024-02-10 DIAGNOSIS — Z13228 Encounter for screening for other metabolic disorders: Secondary | ICD-10-CM

## 2024-02-10 DIAGNOSIS — R29898 Other symptoms and signs involving the musculoskeletal system: Secondary | ICD-10-CM

## 2024-02-10 DIAGNOSIS — M5127 Other intervertebral disc displacement, lumbosacral region: Secondary | ICD-10-CM

## 2024-02-10 DIAGNOSIS — M545 Low back pain, unspecified: Secondary | ICD-10-CM

## 2024-02-10 DIAGNOSIS — Z130 Encounter for screening for diseases of the blood and blood-forming organs and certain disorders involving the immune mechanism: Secondary | ICD-10-CM

## 2024-02-10 DIAGNOSIS — Z9109 Other allergy status, other than to drugs and biological substances: Secondary | ICD-10-CM

## 2024-02-10 LAB — LIPID PANEL

## 2024-02-10 MED ORDER — HYDROXYZINE PAMOATE 25 MG PO CAPS
25.0000 mg | ORAL_CAPSULE | Freq: Every evening | ORAL | 3 refills | Status: AC | PRN
  Filled 2024-02-10: qty 90, 45d supply, fill #0

## 2024-02-10 MED ORDER — CYCLOBENZAPRINE HCL 5 MG PO TABS: 5.0000 mg | ORAL_TABLET | Freq: Three times a day (TID) | ORAL | Status: DC

## 2024-02-10 NOTE — Assessment & Plan Note (Signed)
 Chronic left-sided weakness and numbness involving the leg and hand, with a history of spinal surgery. Symptoms include tingling, numbness, and weakness, particularly in the left leg, which worsens with movement. MRI showed small bulging discs in the lower and upper back, but no significant compression. Neurology, neurosurgery, and pain management have not identified a clear cause. Differential diagnosis includes nerve compression or other neurological conditions. Symptoms have been worsening over the past three months. She is frustrated with the lack of diagnosis and is open to seeing another specialist. It does not appear that any imaging of the head has been completed.  - Refer to a new spine specialist for a second opinion. - Consider referral for brain imaging and nerve conduction studies. - Coordinate with Ludie to facilitate referrals and gather additional opinions.

## 2024-02-10 NOTE — Patient Instructions (Addendum)
 I will speak with Ludie about the referral and we will get a second opinion for you.    WEIGHT LOSS PLANNING Your progress today shows:     02/10/2024    9:25 AM 10/21/2023    3:04 PM 10/10/2023    3:27 PM  Vitals with BMI  Height 5' 7 5' 7   Weight 200 lbs 199 lbs 10 oz 197 lbs 10 oz  BMI 31.32 31.25   Systolic 124 107 891  Diastolic 80 61 68  Pulse 74 94 75    For best management of weight, it is vital to balance intake versus output. This means the number of calories burned per day must be less than the calories you take in with food and drink.   I recommend trying to follow a diet with the following: Calories: 1200-1500 calories per day Carbohydrates: 150-180 grams of carbohydrates per day  Why: Gives your body enough quick fuel for cells to maintain normal function without sending them into starvation mode.  Protein: At least 90 grams of protein per day- 30 grams with each meal Why: Protein takes longer and uses more energy than carbohydrates to break down for fuel. The carbohydrates in your meals serves as quick energy sources and proteins help use some of that extra quick energy to break down to produce long term energy. This helps you not feel hungry as quickly and protein breakdown burns calories.  Water: Drink AT LEAST 64 ounces of water per day  Why: Water is essential to healthy metabolism. Water helps to fill the stomach and keep you fuller longer. Water is required for healthy digestion and filtering of waste in the body.  Fat: Limit fats in your diet- when choosing fats, choose foods with lower fats content such as lean meats (chicken, fish, [place]).  Why: Increased fat intake leads to storage for later. Once you burn your carbohydrate energy, your body goes into fat and protein breakdown mode to help you loose weight.  Cholesterol: Fats and oils that are LIQUID at room temperature are best. Choose vegetable oils (olive oil, avocado oil, nuts). Avoid fats that are  SOLID at room temperature (animal fats, processed meats). Healthy fats are often found in whole grains, beans, nuts, seeds, and berries.  Why: Elevated cholesterol levels lead to build up of cholesterol on the inside of your blood vessels. This will eventually cause the blood vessels to become hard and can lead to high blood pressure and damage to your organs. When the blood flow is reduced, but the pressure is high from cholesterol buildup, parts of the cholesterol can break off and form clots that can go to the brain or heart leading to a stroke or heart attack.  Fiber: Increase amount of SOLUBLE the fiber in your diet. This helps to fill you up, lowers cholesterol, and helps with digestion. Some foods high in soluble fiber are oats, peas, beans, apples, carrots, barley, and citrus fruits.   Why: Fiber fills you up, helps remove excess cholesterol, and aids in healthy digestion which are all very important in weight management.   I recommend the following as a minimum activity routine: Purposeful walk or other physical activity at least 20 minutes every single day. This means purposefully taking a walk, jog, bike, swim, treadmill, elliptical, dance, etc.  This activity should be ABOVE your normal daily activities, such as walking at work. Goal exercise should be at least 150 minutes a week- work your way up to this.  Heart Rate: Your maximum exercise heart rate should be 220 - Your Age in Years. When exercising, get your heart rate up, but avoid going over the maximum targeted heart rate.  60-70% of your maximum heart rate is where you tend to burn the most fat. To find this number:  220 - Age In Years= Max HR  Max HR x 0.6 (or 0.7) = Fat Burning HR The Fat Burning HR is your goal heart rate while working out to burn the most fat.  NEVER exercise to the point your feel lightheaded, weak, nauseated, dizzy. If you experience ANY of these symptoms- STOP exercise! Allow yourself to cool down and  your heart rate to come down. Then restart slower next time.  If at ANY TIME you feel chest pain or chest pressure during exercise, STOP IMMEDIATELY and seek medical attention.

## 2024-02-10 NOTE — Assessment & Plan Note (Signed)
 Weight management is a concern, with current weight at approximately 200 pounds. Limited mobility due to back and leg issues may contribute to difficulty in weight management. - Continue with scheduled consultation with a nutritionist on October 1st to address dietary habits and weight management.

## 2024-02-10 NOTE — Assessment & Plan Note (Signed)
 Seasonal allergic rhinitis with previous use of hydroxyzine  for severe allergy symptoms prescribed by allergist. Benadryl  causes excessive drowsiness. She requests a refill for the hydroxyzine  today.  - Prescribe hydroxyzine  for allergy management.

## 2024-02-10 NOTE — Assessment & Plan Note (Signed)

## 2024-02-11 ENCOUNTER — Ambulatory Visit: Payer: Self-pay | Admitting: Nurse Practitioner

## 2024-02-11 LAB — CBC WITH DIFFERENTIAL/PLATELET
Basophils Absolute: 0 x10E3/uL (ref 0.0–0.2)
Basos: 0 %
EOS (ABSOLUTE): 0.1 x10E3/uL (ref 0.0–0.4)
Eos: 1 %
Hematocrit: 42.6 % (ref 34.0–46.6)
Hemoglobin: 13.7 g/dL (ref 11.1–15.9)
Immature Grans (Abs): 0 x10E3/uL (ref 0.0–0.1)
Immature Granulocytes: 0 %
Lymphocytes Absolute: 1.8 x10E3/uL (ref 0.7–3.1)
Lymphs: 38 %
MCH: 29.6 pg (ref 26.6–33.0)
MCHC: 32.2 g/dL (ref 31.5–35.7)
MCV: 92 fL (ref 79–97)
Monocytes Absolute: 0.4 x10E3/uL (ref 0.1–0.9)
Monocytes: 8 %
Neutrophils Absolute: 2.5 x10E3/uL (ref 1.4–7.0)
Neutrophils: 53 %
Platelets: 276 x10E3/uL (ref 150–450)
RBC: 4.63 x10E6/uL (ref 3.77–5.28)
RDW: 14.2 % (ref 11.7–15.4)
WBC: 4.7 x10E3/uL (ref 3.4–10.8)

## 2024-02-11 LAB — LIPID PANEL
Cholesterol, Total: 236 mg/dL — AB (ref 100–199)
HDL: 66 mg/dL (ref >39)
LDL CALC COMMENT:: 3.6 ratio (ref 0.0–4.4)
LDL Chol Calc (NIH): 145 mg/dL — AB (ref 0–99)
Triglycerides: 139 mg/dL (ref 0–149)
VLDL Cholesterol Cal: 25 mg/dL (ref 5–40)

## 2024-02-11 LAB — CMP14+EGFR
ALT: 12 IU/L (ref 0–32)
AST: 17 IU/L (ref 0–40)
Albumin: 4.7 g/dL (ref 3.9–4.9)
Alkaline Phosphatase: 65 IU/L (ref 41–116)
BUN/Creatinine Ratio: 11 (ref 9–23)
BUN: 10 mg/dL (ref 6–20)
Bilirubin Total: 0.9 mg/dL (ref 0.0–1.2)
CO2: 21 mmol/L (ref 20–29)
Calcium: 9.6 mg/dL (ref 8.7–10.2)
Chloride: 101 mmol/L (ref 96–106)
Creatinine, Ser: 0.95 mg/dL (ref 0.57–1.00)
Globulin, Total: 2.7 g/dL (ref 1.5–4.5)
Glucose: 97 mg/dL (ref 70–99)
Potassium: 4.7 mmol/L (ref 3.5–5.2)
Sodium: 140 mmol/L (ref 134–144)
Total Protein: 7.4 g/dL (ref 6.0–8.5)
eGFR: 79 mL/min/1.73 (ref >59)

## 2024-02-11 LAB — HEMOGLOBIN A1C
Est. average glucose Bld gHb Est-mCnc: 123 mg/dL
Hgb A1c MFr Bld: 5.9 % — ABNORMAL HIGH (ref 4.8–5.6)

## 2024-02-21 ENCOUNTER — Encounter: Payer: Self-pay | Admitting: Nurse Practitioner

## 2024-02-25 ENCOUNTER — Encounter: Payer: Self-pay | Admitting: Nurse Practitioner

## 2024-02-25 ENCOUNTER — Other Ambulatory Visit (HOSPITAL_COMMUNITY): Payer: Self-pay

## 2024-02-25 ENCOUNTER — Other Ambulatory Visit: Payer: Self-pay | Admitting: Nurse Practitioner

## 2024-02-25 DIAGNOSIS — G4733 Obstructive sleep apnea (adult) (pediatric): Secondary | ICD-10-CM

## 2024-02-25 MED ORDER — CYCLOBENZAPRINE HCL 5 MG PO TABS
5.0000 mg | ORAL_TABLET | Freq: Three times a day (TID) | ORAL | 0 refills | Status: DC | PRN
  Filled 2024-02-25: qty 45, 15d supply, fill #0

## 2024-02-25 MED ORDER — NALOXONE HCL 4 MG/0.1ML NA LIQD: 1.0000 | NASAL | 3 refills | Status: AC

## 2024-02-25 MED ORDER — CELECOXIB 200 MG PO CAPS
200.0000 mg | ORAL_CAPSULE | Freq: Every evening | ORAL | 0 refills | Status: DC
  Filled 2024-02-25 – 2024-03-09 (×2): qty 30, 30d supply, fill #0

## 2024-02-25 MED ORDER — HYDROCODONE-ACETAMINOPHEN 10-325 MG PO TABS
1.0000 | ORAL_TABLET | Freq: Two times a day (BID) | ORAL | 0 refills | Status: DC
  Filled 2024-02-25 – 2024-03-09 (×2): qty 60, 30d supply, fill #0

## 2024-02-26 ENCOUNTER — Encounter: Payer: Self-pay | Admitting: Skilled Nursing Facility1

## 2024-02-26 ENCOUNTER — Other Ambulatory Visit (HOSPITAL_COMMUNITY): Payer: Self-pay

## 2024-02-26 ENCOUNTER — Encounter: Attending: Nurse Practitioner | Admitting: Skilled Nursing Facility1

## 2024-02-26 ENCOUNTER — Telehealth: Payer: Self-pay

## 2024-02-26 DIAGNOSIS — R7303 Prediabetes: Secondary | ICD-10-CM | POA: Diagnosis present

## 2024-02-26 NOTE — Progress Notes (Signed)
 Medical Nutrition Therapy  Appointment Start time:  2:30  Appointment End time:  3:30  Primary concerns today: weight loss  Referral diagnosis: r73.03  NUTRITION ASSESSMENT    Clinical Medical Hx: prediabetes  Medications: cholesterol 236, LDL cholesterol 145 Labs: J8R 5.9 Notable Signs/Symptoms: back pain and leg numbness and migraines (none in a while)  Lifestyle & Dietary Hx  Pt state she has pain in her back keeping her form being as active as she would like to be. Pt states he has 4 children with her at home. Pt states her and her 38 year old grocery shop.  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   Pt is a picky eater.   Estimated daily fluid intake: 200 oz Supplements:  Sleep:  Stress / self-care:  Current average weekly physical activity: ADL's  24-Hr Dietary Recall: wakes at 6am or before First Meal:  Snack:  Second meal 11-11:30: fast food or subs or blueberry muffin or skipped Snack 2:30-3:00: blueberry muffin or cucumber salad  Third Meal: fruit salad Snack 7:30-8:00: spaghetti or pork chop + green beans + corn + mac n cheese or cube steak or peanut butter jelly sandwich Beverages: water, lemonade, arnold palmer  NUTRITION INTERVENTION  Nutrition education (E-1) on the following topics:  Creation of balanced and diverse meals to increase the intake of nutrient-rich foods that provide essential vitamins, minerals, fiber, and phytonutrients Variety of Fruits and Vegetables: Aim for a colorful array of fruits and vegetables to ensure a wide range of nutrients. Include a mix of leafy greens, berries, citrus fruits, cruciferous vegetables, and more. Whole Grains: Choose whole grains over refined grains. Examples include brown rice, quinoa, oats, whole wheat, and barley. Lean Proteins: Include lean sources of protein, such as poultry, fish, tofu, legumes, beans, lentils, and low-fat dairy products. Limit red and  processed meats. Healthy Fats: Incorporate sources of healthy fats, including avocados, nuts, seeds, and olive oil. Limit saturated and trans fats found in fried and processed foods. Dairy or Dairy Alternatives: Choose low-fat or fat-free dairy products, or plant-based alternatives like almond or soy milk. Portion Control: Be mindful of portion sizes to avoid overeating. Pay attention to hunger and satisfaction cues. Limit Added Sugars: Minimize the consumption of sugary beverages, snacks, and desserts. Check food labels for added sugars and opt for natural sources of sweetness such as whole fruits. Hydration: Drink plenty of water throughout the day. Limit sugary drinks and excessive caffeine intake. Moderate Sodium Intake: Reduce the consumption of high-sodium foods. Use herbs and spices for flavor instead of excessive salt. Meal Planning and Preparation: Plan and prepare meals ahead of time to make healthier choices more convenient. Include a mix of food groups in each meal. Limit Processed Foods: Minimize the intake of highly processed and packaged foods that are often high in added sugars, salt, and unhealthy fats. Regular Physical Activity: Combine a healthy diet with regular physical activity for overall well-being. Aim for at least 150 minutes of moderate-intensity aerobic exercise per week, along with strength training. Moderation and Balance: Enjoy treats and indulgent foods in moderation, emphasizing balance rather than strict restriction.  Handouts Provided Include  Detailed MyPlate Micro/macros  Learning Style & Readiness for Change Teaching method utilized: Visual & Auditory  Demonstrated degree of understanding via: Teach Back  Barriers to learning/adherence to lifestyle change: picky eating  Goals Established by Pt Aim to go for 15-20 minutes walks 5 days a week More  meals made form home Eat more consistently throughout the day   MONITORING &  EVALUATION Dietary intake, weekly physical activity Next Steps  Patient is to return in 4-6 weeks.

## 2024-02-26 NOTE — Progress Notes (Unsigned)
 Care Guide Pharmacy Note  02/26/2024 Name: Penny Thomas MRN: [id number] DOB: 07-03-1985  Referred By: Oris Camie BRAVO, NP Reason for referral: Complex Care Management and Call Attempt #1 (Unsuccessful initial outreach to schedule with PHARM D- Jon)   Penny Thomas is a 38 y.o. year old female who is a primary care patient of Early, Camie BRAVO, NP.  Penny Thomas was referred to the pharmacist for assistance related to: OSA  An unsuccessful telephone outreach was attempted today to contact the patient who was referred to the pharmacy team for assistance with medication assistance. Additional attempts will be made to contact the patient.  Leotis Rase [facility], [facility] Guide  Direct Dial: [phone]  Fax [phone]

## 2024-02-27 NOTE — Progress Notes (Signed)
 Care Guide Pharmacy Note  02/27/2024 Name: IVANNAH ZODY MRN: [id number] DOB: 05-Sep-1985  Referred By: Oris Camie BRAVO, NP Reason for referral: Complex Care Management, Call Attempt #1 (Unsuccessful initial outreach to schedule with PHARM DGLENWOOD [place]), and Call Attempt #2 (Successful initial outreach scheduled with PHARM D- [place] )   Lemond JONETTA Boring is a 38 y.o. year old female who is a primary care patient of Early, Camie BRAVO, NP.  Lemond JONETTA Boring was referred to the pharmacist for assistance related to: Care Guide Pharmacy Note  02/27/2024 Name: ANNTOINETTE HAEFELE MRN: [id number] DOB: 09/13/1985  Referred By: Oris Camie BRAVO, NP Reason for referral: Complex Care Management, Call Attempt #1 (Unsuccessful initial outreach to schedule with PHARM DGLENWOOD [place]), and Call Attempt #2 (Successful initial outreach scheduled with PHARM D- [place] )   Lemond JONETTA Boring is a 38 y.o. year old female who is a primary care patient of Early, Camie BRAVO, NP.  Lemond JONETTA Boring was referred to the pharmacist for assistance related to: OSA  Successful contact was made with the patient to discuss pharmacy services including being ready for the pharmacist to call at least 5 minutes before the scheduled appointment time and to have medication bottles and any blood pressure readings ready for review. The patient agreed to meet with the pharmacist via telephone visit on (date/time). 03/11/24 @ 2 pm  Leotis Rase [facility], [facility] Guide  Direct Dial: [phone]  Fax [phone]

## 2024-02-28 ENCOUNTER — Other Ambulatory Visit (HOSPITAL_COMMUNITY): Payer: Self-pay

## 2024-03-09 ENCOUNTER — Other Ambulatory Visit (HOSPITAL_COMMUNITY): Payer: Self-pay

## 2024-03-09 ENCOUNTER — Other Ambulatory Visit: Payer: Self-pay | Admitting: Nurse Practitioner

## 2024-03-09 DIAGNOSIS — Z6831 Body mass index (BMI) 31.0-31.9, adult: Secondary | ICD-10-CM

## 2024-03-10 ENCOUNTER — Other Ambulatory Visit: Payer: Self-pay

## 2024-03-10 ENCOUNTER — Other Ambulatory Visit (HOSPITAL_COMMUNITY): Payer: Self-pay

## 2024-03-10 NOTE — Telephone Encounter (Signed)
 Last apt 02/10/24.

## 2024-03-11 ENCOUNTER — Other Ambulatory Visit (INDEPENDENT_AMBULATORY_CARE_PROVIDER_SITE_OTHER)

## 2024-03-11 DIAGNOSIS — G4733 Obstructive sleep apnea (adult) (pediatric): Secondary | ICD-10-CM

## 2024-03-11 MED ORDER — PHENTERMINE HCL 37.5 MG PO TABS
37.5000 mg | ORAL_TABLET | Freq: Every day | ORAL | 0 refills | Status: AC
  Filled 2024-03-11: qty 90, 90d supply, fill #0

## 2024-03-11 NOTE — Progress Notes (Signed)
   03/11/2024  Patient ID: Penny Thomas, female   DOB: 1985/12/17, 38 y.o.   MRN: [id number]  Contacted patient at request of PCP to discuss access to CPAP machine and supplies.   Patient reports that her order was placed through [facility] and that she has a $240 up front cost to meet, plus $75 monthly fee. She cannot afford this.  Advised that patient call company back to see if any type of payment plan assistance is available. If they say this is not possible, advised patient to contact provider who placed order and see if they can send to another DME provider that may be more flexible, such as [facility].  Patient expressed understanding.  Jon VEAR Lindau, PharmD Clinical Pharmacist [phone]

## 2024-03-12 ENCOUNTER — Other Ambulatory Visit (HOSPITAL_COMMUNITY): Payer: Self-pay

## 2024-03-24 ENCOUNTER — Encounter: Payer: Self-pay | Admitting: Nurse Practitioner

## 2024-03-24 ENCOUNTER — Other Ambulatory Visit (HOSPITAL_COMMUNITY): Payer: Self-pay

## 2024-03-25 ENCOUNTER — Encounter (HOSPITAL_COMMUNITY): Payer: Self-pay

## 2024-03-25 ENCOUNTER — Other Ambulatory Visit (HOSPITAL_COMMUNITY): Payer: Self-pay

## 2024-03-29 ENCOUNTER — Encounter: Payer: Self-pay | Admitting: Radiology

## 2024-03-31 ENCOUNTER — Other Ambulatory Visit: Payer: Self-pay

## 2024-03-31 ENCOUNTER — Other Ambulatory Visit (HOSPITAL_COMMUNITY): Payer: Self-pay

## 2024-03-31 MED ORDER — GABAPENTIN 400 MG PO CAPS
400.0000 mg | ORAL_CAPSULE | Freq: Two times a day (BID) | ORAL | 0 refills | Status: DC
  Filled 2024-03-31: qty 60, 30d supply, fill #0

## 2024-03-31 MED ORDER — HYDROCODONE-ACETAMINOPHEN 10-325 MG PO TABS
1.0000 | ORAL_TABLET | Freq: Two times a day (BID) | ORAL | 0 refills | Status: DC
  Filled 2024-03-31 – 2024-04-08 (×2): qty 60, 30d supply, fill #0

## 2024-03-31 MED ORDER — CYCLOBENZAPRINE HCL 5 MG PO TABS
5.0000 mg | ORAL_TABLET | Freq: Three times a day (TID) | ORAL | 0 refills | Status: DC | PRN
  Filled 2024-03-31: qty 90, 30d supply, fill #0

## 2024-03-31 MED ORDER — CELECOXIB 200 MG PO CAPS
200.0000 mg | ORAL_CAPSULE | Freq: Every evening | ORAL | 0 refills | Status: DC
  Filled 2024-03-31 – 2024-04-08 (×3): qty 30, 30d supply, fill #0

## 2024-04-05 ENCOUNTER — Encounter: Payer: Self-pay | Admitting: Neurology

## 2024-04-08 ENCOUNTER — Other Ambulatory Visit (HOSPITAL_COMMUNITY): Payer: Self-pay

## 2024-04-12 ENCOUNTER — Ambulatory Visit: Admitting: Skilled Nursing Facility1

## 2024-04-12 ENCOUNTER — Other Ambulatory Visit: Payer: Self-pay

## 2024-04-12 DIAGNOSIS — Z23 Encounter for immunization: Secondary | ICD-10-CM

## 2024-04-12 NOTE — Progress Notes (Unsigned)
 Patient is in office today for a nurse visit for Immunization. Patient Injection was given in the  Right deltoid. Patient tolerated injection well.

## 2024-04-13 NOTE — Progress Notes (Unsigned)
 NEUROLOGY FOLLOW UP OFFICE NOTE  Penny Thomas [id number]  Assessment/Plan:   Chronic low back pain status post bilateral L5-S1 microdiscectomy  Acute left lower lumbar radiculopathy with weakness Left foot twitching - unclear etiology but appears to correlate with lumbar radicular pain.   Left hand weakness, suspect left sided carpal tunnel syndrome Cervical spinal stenosis - very mild with some flattening of the right hemi-cord at C3-4, which I feel is not the cause of her symptoms, which should affect the right side of her body and while she has some hyperreflexia, she does not have any spasticity or pathologic reflexes to suggest symptomatic myelopathy.   Check NCV-EMG of left upper and lower extremities.  Further recommendations pending results. Otherwise, follow up in 7 months.  Total time spent on today's visit was 32 minutes dedicated to this patient today, preparing to see patient, examining the patient, ordering tests and/or medications and counseling the patient, documenting clinical information in the EHR or other health record, independently interpreting results and communicating results to the patient/family, discussing treatment and goals, answering patient's questions and coordinating care.    Subjective:  Penny Thomas is a 38 year old right-handed female whom I see for migraines and primary stabbing headache, presents today for left leg pain and foot twitching.  UPDATE: Migraines: Typical migraines occur 4 times a month.  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  Left foot tremor: MRI of thoracic spine on 11/04/2023 revealed disc desiccation most notably at T4-5 without thoracic cord abnormality or significant foraminal or spinal stenosis.  MRI of lumbar spine on 11/04/2023 showed Broad-based disc osteophyte at L5-S1 slightly effacing the ventral thecal sac with mild  abutment of the descending S1 nerve root. No frank impingement is identified. There is mild caudal foraminal narrowing at L5-S1, left slightly greater than right. Findings may have slightly progressed compared with the prior examination from 08/21/2021. No impingement of the exiting nerves is identified.  MRI of cervical spine on 11/17/2023 demonstrated 1. Moderate-sized right paracentral disc protrusion at C3-4 with resultant mild spinal stenosis and flattening of the right hemi-cord, but no cord signal changes. 2. Additional mild noncompressive disc bulging at C2-3, C4-5, and C6-7 without significant stenosis or neural impingement.  Still occurs but less often.  Usually occurs when she is standing and walking but also occurs while sitting.  She develops a sharp pain from the first toe of the left toot radiating up the inner leg to the knee with associated tingling sensation, and her foot/ankle will start to tremor.  Has not responded to lumbar epidural injections.  HISTORY: Migraines Onset:  Age 76. Location:  Varies (bi-frontal, back of head) Quality:  Squeezing/feels like I am hit in the head with a hammer Initial Intensity:  9-10/10 Aura:  blurred vision Prodrome:  no Associated symptoms: Nausea, dizziness, photophobia, osmophobia Initial Duration:  1 to 3 days initiail Frequency:  3 to 4 days per week (16 headache days per week) Triggers:  Menstrual cycle, caffeine, scents (perfume, deoderant, hand sanitizer, coffee), chocolate, change in weather Relieving factors: Laying down Activity:  Lays down if severe  Primary Stabbing Headache She started getting a new headache in November 2022.  It is a severe stabbing headache in the left temple.  Left eye gets watery but no conjunctival injection or ptosis.  No visual disturbance, nausea, photophobia or phonophobia.  No other symptoms.  Lasts 5 to 10 minutes.  She has had about 3 episodes.  MRI and MRA of brain on 06/15/2021 personally reviewed  were unremarkable.  Headaches improved with increase in propranolol .  Past medications: Past NSAIDS:  Mobic , ibuprofen  800mg , Cambia  Past analgesics:  Hydrocodone -acetaminophen , tramadol  50mg , Roxicet, Excedrin Migraine Past abortive triptans:  sumatriptan  100mg , Maxalt  10mg , Relpax  20mg , sumatriptan  [place] Past ergot:  none Past muscle relaxants:  Flexeril , baclofen , tizanidine  2mg , Robaxin  Past anti-nausea:  Zofran  4mg , promethazine  25mg  Past antihypertensive medications:  no Past antidepressant medications:  amitriptyline  50mg  (weight gain) Past anticonvulsant medications:  topiramate  150mg  (effective but caused hair loss) Past vitamins/Herbal/Supplements:  no Past antihistamines/decongestants:  no Other past medications:  Reyvow  ineffective   Family history of headache:  Mom, sister, grandmother  Chronic low back pain status post L5-S1 microdiscectomy with left sided radiculopathy: She has history of chronic low back pain.  MRI of lumbar spine on 08/21/2021 showed shallow disc protrusion at L5-S1 potentially irritating either descending S1 nerve roots, as well as disc bulge and facet hypertrophy at L4-5 with mild left foraminal and lateral recess stenosis.  She subsequently underwent bilateral L5-S1 microdiscectomy that June.  She has been followed by the pain clinic since then.  Prior to the surgery, she would experience leg pain and numbness as well twitching in the left foot.  It improved after the surgery, occurring once or twice a week, but it has become more frequent over the past month.  It occurs spontaneously and not triggered by position.  She feels her left leg become numb and numbness and tingling in the left foot and toes.  She will then experience a sharp and intense pain from the foot radiating up the leg to her upper hip.  She is unable to bear weight on it due to pain.  It lasts about 3 to 5 minutes and occurs 3 to 4 times a week.  It tends to occur later in the afternoon.  Her PCP  ordered a lumbar X-ray performed on 10/15/2023 which showed degenerative changes at L4-L5 and L5-S1.  She also notes numbness and tingling in her left hand, particularly when she is in bed.  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   Great toe pain, left 06/16/2019   H/O abdominal supracervical subtotal hysterectomy 12/01/2019   H/O: hysterectomy 10/2019   Hemorrhage in uterus 01/20/2009   History of COVID-19 05/31/2020   History of gestational diabetes 12/02/2018   Hypertension    Migraines    Muscle cramps 03/23/2015   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   Postoperative state 12/01/2019   Pre-diabetes    Recurrent upper respiratory infection (URI)    Seasonal allergies    Sickle cell anemia (HCC)    trait   Sickle cell trait    Sleep apnea     MEDICATIONS: Current Outpatient Medications on File Prior to Visit  Medication Sig Dispense Refill   Albuterol -Budesonide  (AIRSUPRA ) 90-80 MCG/ACT AERO Inhale 2 Inhalations into the lungs every 6 (six) hours as needed.     ALVESCO  160 MCG/ACT inhaler Inhale 1 puff into the lungs 2 (two) times daily. 6.1 g 5   azelastine  (OPTIVAR ) 0.05 % ophthalmic solution Place 1 drop into both eyes 2 (two) times daily. 6 mL 12   Bacillus Coagulans-Inulin (PROBIOTIC-PREBIOTIC PO) Take 1 capsule by mouth daily.     beclomethasone (QVAR ) 80 MCG/ACT inhaler Inhale 1 puff into the lungs  2 (two) times daily.     BIOTIN  PO Take 1 tablet by mouth daily.     celecoxib  (CELEBREX ) 200 MG capsule Take 1 capsule (200 mg total) by mouth Nightly. 30 capsule 0   cetirizine  (ZYRTEC ) 10 MG tablet Take 1 tablet by mouth daily. 30 tablet 5   Cholecalciferol  (D 5000) 125 MCG (5000 UT) capsule Take 1 capsule by mouth daily 30 capsule 3   cyclobenzaprine  (FLEXERIL ) 5 MG tablet Take 1 tablet (5 mg total) by mouth 3 (three) times daily for muscle spasms.     cyclobenzaprine  (FLEXERIL ) 5 MG tablet Take 1 tablet (5 mg total) by mouth 3 (three) times daily as needed for muscle spasms 45 tablet 0   cyclobenzaprine  (FLEXERIL ) 5 MG tablet Take 1 tablet (5 mg total) by mouth 3 (three) times daily as needed for muscle spasms 90 tablet 0   diclofenac  (VOLTAREN ) 75 MG EC tablet Take 1 tablet (75 mg total) by mouth 2 (two) times daily. (Patient not taking: Reported on 02/10/2024) 60 tablet 0   Elastic Bandages & Supports (WRIST BRACE//LEFT LARGE) MISC Wear a wrist splint at night on the left wrist 1 each 0   gabapentin  (NEURONTIN ) 400 MG capsule Take 1 capsule (400 mg total) by mouth 2 (two) times daily. 60 capsule 0   HYDROcodone -acetaminophen  (NORCO) 10-325 MG tablet Take 1 tablet by mouth 2 (two) times daily. 60 tablet 0   HYDROcodone -acetaminophen  (NORCO) 7.5-325 MG tablet Take 1 tablet by mouth 3 (three) times daily as needed. (Patient not taking: Reported on 02/10/2024) 15 tablet 0   hydrOXYzine  (VISTARIL ) 25 MG capsule Take 1 capsule (25 mg total) by mouth at bedtime and may repeat dose one time if needed. For allergies 90 capsule 3   ibuprofen  (ADVIL ) 800 MG tablet Take 1 tablet (800 mg total) by mouth 3 (three) times daily. 21 tablet 0   Multiple Vitamin (MULTIVITAMIN WITH MINERALS) TABS tablet Take 1 tablet by mouth daily.     naloxone  (NARCAN ) nasal spray 4 mg/0.1 mL Place 1 spray into the nose for accidental overdose 2 each 3   naloxone  (NARCAN ) nasal spray 4 mg/0.1 mL Place 1 spray into the nose as  directed for accidental overdose 2 each 3   naproxen  (NAPROSYN ) 500 MG tablet Take 1 tablet (500 mg total) by mouth every 12 (twelve) hours as needed. 60 tablet 5   phentermine  (ADIPEX-P ) 37.5 MG tablet Take 1 tablet (37.5 mg total) by mouth daily before breakfast. 90 tablet 0   propranolol  ER (INDERAL  LA) 120 MG 24 hr capsule Take 1 capsule (120 mg total) by mouth at bedtime. 90 capsule 3   sertraline  (ZOLOFT ) 50 MG tablet Take 1 tablet (50 mg total) by mouth daily. 90 tablet 3   simethicone  (MYLICON) 125 MG chewable tablet Chew 125 mg by mouth in the morning and at bedtime.     SUMAtriptan  6 MG/0.5ML SOAJ INJECT 6 MG INTO THE SKIN AS NEEDED (  MAY REPEAT IN ONE HOUR. MAXIMUM 2 INJECTIONS IN 24 HOURS.). 3 mL 5   traMADol  (ULTRAM ) 50 MG tablet Take 2 tablets (100 mg total) by mouth 2 (two) times daily as needed. 120 tablet 0   [DISCONTINUED] esomeprazole  (NEXIUM ) 40 MG capsule TAKE 1 CAPSULE BY MOUTH ONCE A DAY AT 12 NOON 30 capsule 1   No current facility-administered medications on file prior to visit.    ALLERGIES: Allergies  Allergen Reactions   [name]  [Elagolix] Other (See Comments)    Hallucinations    FAMILY HISTORY: Family History  Problem Relation Age of Onset   Fibromyalgia Mother    Multiple sclerosis Mother    Arthritis Mother    Asthma Father    Healthy Maternal Grandmother    Healthy Maternal Grandfather    Healthy Paternal Grandmother    Healthy Paternal Grandfather    Endometriosis Sister    Diabetes Sister    Healthy Brother    Healthy Child       Objective:  Blood pressure 107/61, pulse 94, height 5' 7 (1.702 m), weight 199 lb 9.6 oz (90.5 kg), last menstrual period 11/29/2015, SpO2 99%. General: No acute distress.  Patient appears well-groomed.   Head:  Normocephalic/atraumatic Eyes:  Fundi examined but not visualized Neck: supple, no paraspinal tenderness, full range of motion Heart:  Regular rate and rhythm Back: No paraspinal  tenderness Neurological Exam: alert and oriented.  Speech fluent and not dysarthric, language intact.  CN II-XII intact. Bulk and tone normal, muscle strength 5-/5 left hand grip and give-way weakness left hip flexion, knee extension and ankle dorsiflexion, otherwise 5/5 throughout.  Sensation to pinprick reduced in left hand and left lower extremity below the knee, except for lateral lower leg.  Vibratory sensation intact.  Tinel's sign in left wrist negative.  Deep tendon reflexes 3+ throughout except 2+ ankles.  Hoffman sign absent.  Babinski sign absent  Finger to nose testing intact.  Gait normal, Romberg negative.   Juliene Dunnings, DO  CC: Camie Doing, NP

## 2024-04-14 ENCOUNTER — Encounter: Payer: Self-pay | Admitting: Neurology

## 2024-04-14 ENCOUNTER — Ambulatory Visit: Admitting: Neurology

## 2024-04-14 ENCOUNTER — Encounter: Payer: Self-pay | Admitting: Nurse Practitioner

## 2024-04-14 VITALS — BP 119/80 | HR 107 | Ht 67.0 in | Wt 196.0 lb

## 2024-04-14 DIAGNOSIS — R29898 Other symptoms and signs involving the musculoskeletal system: Secondary | ICD-10-CM

## 2024-04-14 DIAGNOSIS — M5416 Radiculopathy, lumbar region: Secondary | ICD-10-CM | POA: Diagnosis not present

## 2024-04-14 NOTE — Patient Instructions (Addendum)
 Will start with nerve study of left arm and leg.  Further recommendation pending results. ELECTROMYOGRAM AND NERVE CONDUCTION STUDIES (EMG/NCS) INSTRUCTIONS  How to Prepare The neurologist conducting the EMG will need to know if you have certain medical conditions. Tell the neurologist and other EMG lab personnel if you: Have a pacemaker or any other electrical medical device Take blood-thinning medications Have hemophilia, a blood-clotting disorder that causes prolonged bleeding Bathing Take a shower or bath shortly before your exam in order to remove oils from your skin. Don't apply lotions or creams before the exam.  What to Expect You'll likely be asked to change into a hospital gown for the procedure and lie down on an examination table. The following explanations can help you understand what will happen during the exam.  Electrodes. The neurologist or a technician places surface electrodes at various locations on your skin depending on where you're experiencing symptoms. Or the neurologist may insert needle electrodes at different sites depending on your symptoms.  Sensations. The electrodes will at times transmit a tiny electrical current that you may feel as a twinge or spasm. The needle electrode may cause discomfort or pain that usually ends shortly after the needle is removed. If you are concerned about discomfort or pain, you may want to talk to the neurologist about taking a short break during the exam.  Instructions. During the needle EMG, the neurologist will assess whether there is any spontaneous electrical activity when the muscle is at rest - activity that isn't present in healthy muscle tissue - and the degree of activity when you slightly contract the muscle.  He or she will give you instructions on resting and contracting a muscle at appropriate times. Depending on what muscles and nerves the neurologist is examining, he or she may ask you to change positions during the exam.   After your EMG You may experience some temporary, minor bruising where the needle electrode was inserted into your muscle. This bruising should fade within several days. If it persists, contact your primary care doctor.   Otherwise, follow up 7 months.

## 2024-04-15 ENCOUNTER — Telehealth (INDEPENDENT_AMBULATORY_CARE_PROVIDER_SITE_OTHER): Admitting: Nurse Practitioner

## 2024-04-15 ENCOUNTER — Other Ambulatory Visit (HOSPITAL_COMMUNITY): Payer: Self-pay

## 2024-04-15 DIAGNOSIS — B9689 Other specified bacterial agents as the cause of diseases classified elsewhere: Secondary | ICD-10-CM | POA: Diagnosis not present

## 2024-04-15 DIAGNOSIS — B379 Candidiasis, unspecified: Secondary | ICD-10-CM | POA: Diagnosis not present

## 2024-04-15 DIAGNOSIS — J069 Acute upper respiratory infection, unspecified: Secondary | ICD-10-CM

## 2024-04-15 MED ORDER — FLUCONAZOLE 150 MG PO TABS
150.0000 mg | ORAL_TABLET | Freq: Once | ORAL | 0 refills | Status: AC
  Filled 2024-04-15: qty 2, 2d supply, fill #0

## 2024-04-15 MED ORDER — BENZONATATE 200 MG PO CAPS
200.0000 mg | ORAL_CAPSULE | Freq: Three times a day (TID) | ORAL | 1 refills | Status: AC | PRN
  Filled 2024-04-15: qty 30, 10d supply, fill #0

## 2024-04-15 MED ORDER — HYDROCODONE BIT-HOMATROP MBR 5-1.5 MG/5ML PO SOLN
5.0000 mL | Freq: Every evening | ORAL | 0 refills | Status: AC | PRN
  Filled 2024-04-15: qty 120, 24d supply, fill #0

## 2024-04-15 MED ORDER — PREDNISONE 20 MG PO TABS
20.0000 mg | ORAL_TABLET | Freq: Every day | ORAL | 0 refills | Status: AC
  Filled 2024-04-15: qty 5, 5d supply, fill #0

## 2024-04-15 MED ORDER — AZITHROMYCIN 250 MG PO TABS
ORAL_TABLET | ORAL | 0 refills | Status: AC
  Filled 2024-04-15: qty 6, 5d supply, fill #0

## 2024-04-15 NOTE — Progress Notes (Addendum)
 Virtual Visit Encounter mychart visit.   I connected with  Shawnice D Kelly-Evans on 04/15/24 at  1:45 PM EST by secure video and audio telemedicine application. I verified that I am speaking with the correct person using two identifiers.   I introduced myself as a Publishing Rights Manager with the practice. The limitations of evaluation and management by telemedicine discussed with the patient and the availability of in person appointments. The patient expressed verbal understanding and consent to proceed.  Participating parties in this visit include: Myself and patient  The patient is: Patient Location: Home I am: Provider Location: Office/Clinic Subjective:    CC and HPI:  History of Present Illness CIA Penny Thomas is a 38 year old female who presents with loss of voice, cough, and nasal congestion.  She lost her voice yesterday, which has somewhat returned today. Initially, she experienced a sore throat at the onset of symptoms, but the sore throat has since resolved. She now has drainage down the back of her throat, nasal congestion, and is coughing up dark brown phlegm. Nasal discharge is a thick mixture of yellow and green when she blows her nose. Her current symptoms started earlier this week, however, she was ill with similar symptoms that started about 2 weeks ago, that had improved with these symptoms started.   No fever is present that she is aware of. The cough is persistent and disrupts her sleep, despite using Robitussin DM every four hours, which provides some relief. She has experienced shortness of breath, which improves with the use of her inhaler.  She has a known allergy to [name]  and no known allergies to antibiotics. She has previously tolerated cough syrup with codeine well. She reports a tendency to develop yeast infections when taking antibiotics.   Past medical history, Surgical history, Family history not pertinant except as noted below, Social history,  Allergies, and medications have been entered into the medical record, reviewed, and corrections made.   Review of Systems:  All review of systems negative except what is listed in the HPI  Objective:    Alert and oriented x 4 Ill appearing Voice hoarse and weak Productive cough present Audible congestion  Impression and Recommendations:    Problem List Items Addressed This Visit   None Visit Diagnoses       Bacterial URI    -  Primary   Relevant Medications   azithromycin  (ZITHROMAX ) 250 MG tablet   benzonatate  (TESSALON ) 200 MG capsule   HYDROcodone  bit-homatropine (HYCODAN) 5-1.5 MG/5ML syrup   predniSONE  (DELTASONE ) 20 MG tablet   fluconazole  (DIFLUCAN ) 150 MG tablet     Yeast infection       Relevant Medications   azithromycin  (ZITHROMAX ) 250 MG tablet   fluconazole  (DIFLUCAN ) 150 MG tablet     Acute upper respiratory infection Symptoms began on Tuesday with sore throat, progressing to sinus congestion, nasal drainage, and productive cough with dark brown sputum. No fever reported. Symptoms suggest a viral etiology with potential bacterial superinfection. Cough is persistent and disruptive, especially at night. Shortness of breath improved with inhaler use. No positive tests for COVID or flu. Symptoms consistent with a prevalent viral illness since Halloween. - Prescribed azithromycin  (Z-Pak) for potential bacterial superinfection. - Prescribed Tessalon  Perles for daytime cough suppression. - Prescribed codeine-containing cough syrup for nighttime use to aid sleep. - Prescribed low-dose prednisone  for five days to reduce inflammation and improve cough. - Advised wearing a mask at work for at least five days after symptom onset. -  Provided a doctor's note to excuse from work for the next day.  Candidiasis prophylaxis due to antibiotic use Prophylactic treatment for potential yeast infection due to antibiotic use. She reports frequent yeast infections with antibiotic  use. - Prescribed Diflucan  for prophylaxis against yeast infection.  orders and follow up as documented in EMR I discussed the assessment and treatment plan with the patient. The patient was provided an opportunity to ask questions and all were answered. The patient agreed with the plan and demonstrated an understanding of the instructions.   The patient was advised to call back or seek an in-person evaluation if the symptoms worsen or if the condition fails to improve as anticipated.  Follow-Up: prn  I provided 16 minutes of non-face-to-face interaction with this non face-to-face encounter including intake, same-day documentation, and chart review.   Camie CHARLENA Doing, NP , DNP, AGNP-c [facility]

## 2024-04-17 ENCOUNTER — Encounter: Payer: Self-pay | Admitting: Nurse Practitioner

## 2024-04-26 MED ORDER — AMOXICILLIN-POT CLAVULANATE 875-125 MG PO TABS
1.0000 | ORAL_TABLET | Freq: Two times a day (BID) | ORAL | 0 refills | Status: AC
  Filled 2024-04-26: qty 20, 10d supply, fill #0

## 2024-04-26 NOTE — Telephone Encounter (Signed)
 Called patient and she states she is still coughing, with green phlegm, Short of breath/wheezing.

## 2024-04-27 ENCOUNTER — Other Ambulatory Visit (HOSPITAL_COMMUNITY): Payer: Self-pay

## 2024-04-27 ENCOUNTER — Other Ambulatory Visit: Payer: Self-pay | Admitting: Nurse Practitioner

## 2024-04-27 DIAGNOSIS — B9689 Other specified bacterial agents as the cause of diseases classified elsewhere: Secondary | ICD-10-CM

## 2024-04-27 NOTE — Telephone Encounter (Signed)
 Tried to refuse but it would not let me. Pt. Has already had this twice it looks like.

## 2024-04-28 ENCOUNTER — Other Ambulatory Visit (HOSPITAL_COMMUNITY): Payer: Self-pay

## 2024-04-28 MED ORDER — NALOXONE HCL 4 MG/0.1ML NA LIQD
2.0000 | Freq: Every day | NASAL | 3 refills | Status: AC
  Filled 2024-04-28: qty 2, 1d supply, fill #0

## 2024-04-28 MED ORDER — CYCLOBENZAPRINE HCL 5 MG PO TABS
5.0000 mg | ORAL_TABLET | Freq: Three times a day (TID) | ORAL | 0 refills | Status: DC | PRN
  Filled 2024-04-28: qty 90, 30d supply, fill #0

## 2024-04-28 MED ORDER — MOVANTIK 25 MG PO TABS
25.0000 mg | ORAL_TABLET | Freq: Every morning | ORAL | 12 refills | Status: AC
  Filled 2024-04-28 – 2024-05-18 (×2): qty 30, 30d supply, fill #0

## 2024-04-28 MED ORDER — CELECOXIB 200 MG PO CAPS
200.0000 mg | ORAL_CAPSULE | Freq: Every evening | ORAL | 0 refills | Status: DC
  Filled 2024-04-28 – 2024-05-07 (×6): qty 30, 30d supply, fill #0

## 2024-04-28 MED ORDER — HYDROCODONE-ACETAMINOPHEN 10-325 MG PO TABS
1.0000 | ORAL_TABLET | Freq: Two times a day (BID) | ORAL | 0 refills | Status: DC
  Filled 2024-04-28 – 2024-05-05 (×3): qty 60, 30d supply, fill #0

## 2024-04-29 ENCOUNTER — Other Ambulatory Visit (HOSPITAL_COMMUNITY): Payer: Self-pay

## 2024-04-29 MED ORDER — GABAPENTIN 400 MG PO CAPS
400.0000 mg | ORAL_CAPSULE | Freq: Two times a day (BID) | ORAL | 0 refills | Status: DC
  Filled 2024-04-29: qty 60, 30d supply, fill #0

## 2024-05-05 ENCOUNTER — Other Ambulatory Visit (HOSPITAL_COMMUNITY): Payer: Self-pay

## 2024-05-05 ENCOUNTER — Other Ambulatory Visit: Payer: Self-pay

## 2024-05-07 ENCOUNTER — Other Ambulatory Visit (HOSPITAL_COMMUNITY): Payer: Self-pay

## 2024-05-07 ENCOUNTER — Other Ambulatory Visit: Payer: Self-pay

## 2024-05-13 ENCOUNTER — Other Ambulatory Visit (HOSPITAL_COMMUNITY): Payer: Self-pay

## 2024-05-18 ENCOUNTER — Other Ambulatory Visit (HOSPITAL_COMMUNITY): Payer: Self-pay

## 2024-05-18 ENCOUNTER — Other Ambulatory Visit: Payer: Self-pay

## 2024-05-19 ENCOUNTER — Other Ambulatory Visit (HOSPITAL_COMMUNITY): Payer: Self-pay

## 2024-05-21 ENCOUNTER — Other Ambulatory Visit (HOSPITAL_COMMUNITY): Payer: Self-pay

## 2024-05-24 ENCOUNTER — Other Ambulatory Visit (HOSPITAL_COMMUNITY): Payer: Self-pay

## 2024-05-24 ENCOUNTER — Encounter: Payer: Self-pay | Admitting: Nurse Practitioner

## 2024-05-25 ENCOUNTER — Encounter: Payer: Self-pay | Admitting: Neurology

## 2024-05-28 ENCOUNTER — Encounter: Payer: Self-pay | Admitting: Nurse Practitioner

## 2024-05-31 ENCOUNTER — Other Ambulatory Visit: Payer: Self-pay

## 2024-05-31 ENCOUNTER — Other Ambulatory Visit (HOSPITAL_COMMUNITY): Payer: Self-pay

## 2024-05-31 MED ORDER — NALOXONE HCL 4 MG/0.1ML NA LIQD
1.0000 | NASAL | 3 refills | Status: AC | PRN
  Filled 2024-05-31: qty 2, 2d supply, fill #0

## 2024-05-31 MED ORDER — HYDROCODONE-ACETAMINOPHEN 10-325 MG PO TABS
1.0000 | ORAL_TABLET | Freq: Two times a day (BID) | ORAL | 0 refills | Status: DC
  Filled 2024-05-31 – 2024-06-18 (×2): qty 60, 30d supply, fill #0

## 2024-05-31 MED ORDER — CYCLOBENZAPRINE HCL 5 MG PO TABS
5.0000 mg | ORAL_TABLET | Freq: Three times a day (TID) | ORAL | 0 refills | Status: AC | PRN
  Filled 2024-05-31: qty 90, 30d supply, fill #0

## 2024-05-31 MED ORDER — MOVANTIK 25 MG PO TABS
25.0000 mg | ORAL_TABLET | Freq: Every morning | ORAL | 12 refills | Status: AC
  Filled 2024-05-31: qty 30, 30d supply, fill #0

## 2024-05-31 MED ORDER — GABAPENTIN 400 MG PO CAPS
400.0000 mg | ORAL_CAPSULE | Freq: Two times a day (BID) | ORAL | 0 refills | Status: DC
  Filled 2024-05-31: qty 60, 30d supply, fill #0

## 2024-05-31 MED ORDER — CELECOXIB 200 MG PO CAPS
200.0000 mg | ORAL_CAPSULE | Freq: Every evening | ORAL | 0 refills | Status: AC
  Filled 2024-05-31 – 2024-06-01 (×2): qty 30, 30d supply, fill #0

## 2024-06-01 ENCOUNTER — Other Ambulatory Visit: Payer: Self-pay

## 2024-06-01 ENCOUNTER — Other Ambulatory Visit (HOSPITAL_COMMUNITY): Payer: Self-pay

## 2024-06-01 ENCOUNTER — Other Ambulatory Visit: Payer: Self-pay | Admitting: Neurology

## 2024-06-01 ENCOUNTER — Other Ambulatory Visit: Payer: Self-pay | Admitting: Nurse Practitioner

## 2024-06-01 DIAGNOSIS — F4010 Social phobia, unspecified: Secondary | ICD-10-CM

## 2024-06-01 MED ORDER — SERTRALINE HCL 50 MG PO TABS
50.0000 mg | ORAL_TABLET | Freq: Every day | ORAL | 1 refills | Status: AC
  Filled 2024-06-01 – 2024-06-18 (×2): qty 90, 90d supply, fill #0

## 2024-06-02 ENCOUNTER — Other Ambulatory Visit: Payer: Self-pay

## 2024-06-02 ENCOUNTER — Other Ambulatory Visit (HOSPITAL_COMMUNITY): Payer: Self-pay

## 2024-06-02 MED ORDER — PROPRANOLOL HCL ER 120 MG PO CP24
120.0000 mg | ORAL_CAPSULE | Freq: Every day | ORAL | 0 refills | Status: AC
  Filled 2024-06-02: qty 90, 90d supply, fill #0

## 2024-06-03 ENCOUNTER — Other Ambulatory Visit: Payer: Self-pay

## 2024-06-04 ENCOUNTER — Other Ambulatory Visit (HOSPITAL_COMMUNITY): Payer: Self-pay

## 2024-06-07 ENCOUNTER — Other Ambulatory Visit (HOSPITAL_COMMUNITY): Payer: Self-pay

## 2024-06-08 ENCOUNTER — Ambulatory Visit: Payer: BC Managed Care – PPO | Admitting: Neurology

## 2024-06-10 ENCOUNTER — Other Ambulatory Visit (HOSPITAL_COMMUNITY): Payer: Self-pay

## 2024-06-18 ENCOUNTER — Other Ambulatory Visit (HOSPITAL_COMMUNITY): Payer: Self-pay

## 2024-06-25 ENCOUNTER — Ambulatory Visit: Admitting: Neurology

## 2024-06-25 DIAGNOSIS — R29898 Other symptoms and signs involving the musculoskeletal system: Secondary | ICD-10-CM | POA: Diagnosis not present

## 2024-06-25 NOTE — Procedures (Signed)
 " [facility]  [address] [place], [address]  [place], [place] [address] Tel: [phone] Fax: [phone] Test Date:  06/25/2024  Patient: Penny Thomas DOB: 10/21/1987 Physician: Tonita Blanch, DO  Sex: Female Height: 5' 7 Ref Phys: Juliene Dunnings, DO  ID#: [id number]   Technician:    History: This is a 39 year old female referred for evaluation of left side paresthesias and weakness.  NCV & EMG Findings: Extensive electrodiagnostic testing of the left upper and lower extremity shows:  Left median, ulnar, mixed palmar, sural, and superficial peroneal sensory responses are within normal limits. Left median, ulnar, peroneal, and tibial motor responses are within normal limits. Left tibial H reflex study is within normal limits. There is no evidence of active or chronic motor axonal loss changes affecting any of the tested muscles.  Motor unit configuration and recruitment pattern is within normal limits.  Impression: This is a normal study of the left upper and lower extremities.  In particular, there is no evidence of a large fiber sensorimotor polyneuropathy, diffuse myopathy, or cervical/lumbosacral radiculopathy.   ___________________________ Tonita Blanch, DO    Nerve Conduction Studies   Stim Site NR Peak (ms) Norm Peak (ms) O-P Amp (V) Norm O-P Amp  Left Median Anti Sensory (2nd Digit)  32 C  Wrist    2.7 <3.4 51.0 >20  Left Sup Peroneal Anti Sensory (Ant Lat Mall)  32 C  12 cm    2.2 <4.5 11.7 >5  Left Sural Anti Sensory (Lat Mall)  32 C  Calf    2.3 <4.5 22.7 >5  Left Ulnar Anti Sensory (5th Digit)  32 C  Wrist    2.4 <3.1 37.6 >12     Stim Site NR Onset (ms) Norm Onset (ms) O-P Amp (mV) Norm O-P Amp Site1 Site2 Delta-0 (ms) Dist (cm) Vel (m/s) Norm Vel (m/s)  Left Median Motor (Abd Poll Brev)  32 C  Wrist    2.7 <3.9 11.0 >6 Elbow Wrist 4.7 32.0 68 >50  Elbow    7.4  11.0         Left Peroneal Motor (Ext Dig Brev)  32 C  Ankle    3.4 <5.5  4.7 >3 B Fib Ankle 7.0 38.0 54 >40  B Fib    10.4  4.4  Poplt B Fib 1.1 7.0 64 >40  Poplt    11.5  4.2         Left Tibial Motor (Abd Hall Brev)  32 C  Ankle    3.4 <6.0 8.3 >8 Knee Ankle 7.7 41.0 53 >40  Knee    11.1  8.1         Left Ulnar Motor (Abd Dig Minimi)  32 C  Wrist    1.7 <3.1 9.0 >7 B Elbow Wrist 3.5 21.0 60 >50  B Elbow    5.2  8.7  A Elbow B Elbow 1.9 10.0 53 >50  A Elbow    7.1  8.7            Stim Site NR Peak (ms) Norm Peak (ms) P-T Amp (V) Site1 Site2 Delta-P (ms) Norm Delta (ms)  Left Median/Ulnar Palm Comparison (Wrist - 8cm)  32 C  Median Palm    1.4 <2.2 67.6 Median Palm Ulnar Palm 0.0   Ulnar Palm    1.4 <2.2 32.5       Electromyography   Side Muscle Ins.Act Fibs Fasc Recrt Amp Dur Poly Activation Comment  Left 1stDorInt Nml Nml  Nml Nml Nml Nml Nml Nml N/A  Left PronatorTeres Nml Nml Nml Nml Nml Nml Nml Nml N/A  Left Biceps Nml Nml Nml Nml Nml Nml Nml Nml N/A  Left Triceps Nml Nml Nml Nml Nml Nml Nml Nml N/A  Left Deltoid Nml Nml Nml Nml Nml Nml Nml Nml N/A  Left AntTibialis Nml Nml Nml Nml Nml Nml Nml Nml N/A  Left Gastroc Nml Nml Nml Nml Nml Nml Nml Nml N/A  Left Flex Dig Long Nml Nml Nml Nml Nml Nml Nml Nml N/A  Left BicepsFemS Nml Nml Nml Nml Nml Nml Nml Nml N/A  Left RectFemoris Nml Nml Nml Nml Nml Nml Nml Nml N/A  Left GluteusMed Nml Nml Nml Nml Nml Nml Nml Nml N/A      Waveforms:                         "

## 2024-06-27 ENCOUNTER — Encounter: Payer: Self-pay | Admitting: Neurology

## 2024-06-27 DIAGNOSIS — R29898 Other symptoms and signs involving the musculoskeletal system: Secondary | ICD-10-CM

## 2024-06-28 ENCOUNTER — Ambulatory Visit: Payer: Self-pay | Admitting: Neurology

## 2024-07-01 ENCOUNTER — Other Ambulatory Visit (HOSPITAL_COMMUNITY): Payer: Self-pay

## 2024-07-01 ENCOUNTER — Other Ambulatory Visit: Payer: Self-pay

## 2024-07-01 ENCOUNTER — Encounter: Payer: Self-pay | Admitting: Nurse Practitioner

## 2024-07-01 MED ORDER — GABAPENTIN 400 MG PO CAPS
400.0000 mg | ORAL_CAPSULE | Freq: Two times a day (BID) | ORAL | 0 refills | Status: AC
  Filled 2024-07-01: qty 60, 30d supply, fill #0

## 2024-07-01 MED ORDER — CYCLOBENZAPRINE HCL 5 MG PO TABS
5.0000 mg | ORAL_TABLET | Freq: Three times a day (TID) | ORAL | 0 refills | Status: AC | PRN
  Filled 2024-07-01: qty 90, 30d supply, fill #0

## 2024-07-01 MED ORDER — MOVANTIK 25 MG PO TABS
25.0000 mg | ORAL_TABLET | Freq: Every morning | ORAL | 12 refills | Status: AC
  Filled 2024-07-01: qty 30, 30d supply, fill #0

## 2024-07-01 MED ORDER — NALOXONE HCL 4 MG/0.1ML NA LIQD
1.0000 | NASAL | 3 refills | Status: AC
  Filled 2024-07-01: qty 2, 2d supply, fill #0

## 2024-07-01 MED ORDER — HYDROCODONE-ACETAMINOPHEN 10-325 MG PO TABS: 1.0000 | ORAL_TABLET | Freq: Two times a day (BID) | ORAL | 0 refills | Status: AC

## 2024-07-01 MED ORDER — CELECOXIB 200 MG PO CAPS
200.0000 mg | ORAL_CAPSULE | Freq: Every evening | ORAL | 0 refills | Status: AC
  Filled 2024-07-01: qty 30, 30d supply, fill #0

## 2024-07-02 ENCOUNTER — Encounter (HOSPITAL_COMMUNITY): Payer: Self-pay

## 2024-07-02 ENCOUNTER — Other Ambulatory Visit (HOSPITAL_COMMUNITY): Payer: Self-pay

## 2024-07-15 ENCOUNTER — Other Ambulatory Visit

## 2024-07-29 ENCOUNTER — Ambulatory Visit: Admitting: Nurse Practitioner

## 2024-08-12 ENCOUNTER — Other Ambulatory Visit

## 2024-08-19 ENCOUNTER — Ambulatory Visit: Admitting: Nurse Practitioner

## 2024-09-21 ENCOUNTER — Institutional Professional Consult (permissible substitution): Admitting: Plastic Surgery

## 2024-11-16 ENCOUNTER — Ambulatory Visit: Admitting: Neurology

## 2025-02-14 ENCOUNTER — Encounter: Payer: Self-pay | Admitting: Nurse Practitioner
